# Patient Record
Sex: Female | Born: 1995 | Hispanic: No | Marital: Single
Health system: Southern US, Community
[De-identification: ages and names within clinical notes are randomized; demographics above are authoritative.]

## PROBLEM LIST (undated history)

## (undated) DIAGNOSIS — E119 Type 2 diabetes mellitus without complications: Secondary | ICD-10-CM

---
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
## 2015-07-10 ENCOUNTER — Emergency Department
Admission: EM | Admit: 2015-07-10 | Discharge: 2015-07-10 | Disposition: A | Discharge status: Home / Self Care | Payer: Medicaid Other | Attending: Emergency Medicine | Admitting: Emergency Medicine

## 2015-07-10 ENCOUNTER — Emergency Department: Payer: Medicaid Other

## 2015-07-10 ENCOUNTER — Other Ambulatory Visit: Payer: Self-pay

## 2015-07-10 ENCOUNTER — Encounter: Payer: Self-pay | Admitting: Emergency Medicine

## 2015-07-10 DIAGNOSIS — R0789 Other chest pain: Secondary | ICD-10-CM | POA: Diagnosis not present

## 2015-07-10 DIAGNOSIS — G8929 Other chronic pain: Secondary | ICD-10-CM | POA: Diagnosis not present

## 2015-07-10 DIAGNOSIS — I10 Essential (primary) hypertension: Secondary | ICD-10-CM | POA: Diagnosis not present

## 2015-07-10 DIAGNOSIS — E119 Type 2 diabetes mellitus without complications: Secondary | ICD-10-CM | POA: Insufficient documentation

## 2015-07-10 DIAGNOSIS — R079 Chest pain, unspecified: Secondary | ICD-10-CM | POA: Diagnosis present

## 2015-07-10 HISTORY — DX: Type 2 diabetes mellitus without complications: E11.9

## 2015-07-10 LAB — BASIC METABOLIC PANEL
ANION GAP: 7 (ref 5–15)
BUN: 14 mg/dL (ref 6–20)
CO2: 27 mmol/L (ref 22–32)
Calcium: 9.1 mg/dL (ref 8.9–10.3)
Chloride: 103 mmol/L (ref 101–111)
Creatinine, Ser: 0.56 mg/dL (ref 0.44–1.00)
GFR calc Af Amer: >60 mL/min (ref >60)
GLUCOSE: 125 mg/dL — AB (ref 65–99)
POTASSIUM: 4.4 mmol/L (ref 3.5–5.1)
Sodium: 137 mmol/L (ref 135–145)

## 2015-07-10 LAB — CBC
HCT: 39.1 % (ref 35.0–47.0)
Hemoglobin: 13 g/dL (ref 12.0–16.0)
MCH: 31 pg (ref 26.0–34.0)
MCHC: 33.3 g/dL (ref 32.0–36.0)
MCV: 92.9 fL (ref 80.0–100.0)
PLATELETS: 66 10*3/uL — AB (ref 150–440)
RBC: 4.21 MIL/uL (ref 3.80–5.20)
RDW: 13.9 % (ref 11.5–14.5)
WBC: 9.1 10*3/uL (ref 3.6–11.0)

## 2015-07-10 MED ORDER — GI COCKTAIL ~~LOC~~
30.0000 mL | Freq: Once | ORAL | Status: AC
  Administered 2015-07-10: 30 mL via ORAL
  Filled 2015-07-10: qty 30

## 2015-07-10 NOTE — ED Notes (Signed)
Pt to xray

## 2015-07-10 NOTE — ED Notes (Signed)
Special needs individual brought in via [facility] from school with caregiver who both speak spanish.  Interpreter paged.  Patient has diabetes and people that were with her thought that she was hypoglycemic, they checked her CBG and it was 125.  Patient usually has a burning sensation in her chest whenever she is hypoglycemic and told ems that this feels similar to that.  CBG with EMS 125, VSS during triage.  Interpreter paged.  NSR, ECG unremarkable per EMS.

## 2015-07-10 NOTE — ED Provider Notes (Addendum)
[facility] Emergency Department Provider Note  ____________________________________________  Time seen: Approximately 7:22 PM  I have reviewed the triage vital signs and the nursing notes.   HISTORY  Chief Complaint Chest Pain  History is limited due to patient mild cognitive dysfunction. Some of the history is obtained from her mother.  HPI Linda Mason is a 20 y.o. female with a history of Prader-Willi syndrome, HTN, DM, and chronic chest pain presenting with chest pain.Patient states that after eating a lunch consisting of a cheeseburger, beans, and fruit that she developed chest pain she is unable to characterize the chest pain but denies any associated shortness of breath, nausea or vomiting, palpitations, or syncope.  The patient's mother states that she often develops chest pain with the 2 most common causes being hyperglycemia and "if she sees something that makes a big impression on her."  Per her mother, there were no major events at school today and the patient's blood sugar was normal when checked by EMS. The patient denies any recent illness including nausea, vomiting, abdominal pain, cough or cold symptoms, fever or chills.   Past Medical History  Diagnosis Date  . Diabetes mellitus without complication (HCC)     There are no active problems to display for this patient.   History reviewed. No pertinent past surgical history.  No current outpatient prescriptions on file.  Allergies Review of patient's allergies indicates no known allergies.  No family history on file.  Social History Social History  Substance Use Topics  . Smoking status: Never Smoker   . Smokeless tobacco: None  . Alcohol Use: No    Review of Systems Constitutional: No fever/chills. No lightheadedness or syncope. Eyes: No visual changes. ENT: No sore throat. Cardiovascular: Positive chest pain, without palpitations. Respiratory: Denies shortness of breath.  No  cough. Gastrointestinal: No abdominal pain.  No nausea, no vomiting.  No diarrhea.  No constipation. Genitourinary: Negative for dysuria. Musculoskeletal: Negative for back pain. Skin: Negative for rash. Neurological: Negative for headaches, focal weakness or numbness.  10-point ROS otherwise negative.  ____________________________________________   PHYSICAL EXAM:  VITAL SIGNS: ED Triage Vitals  Enc Vitals Group     BP 07/10/15 1607 151/94 mmHg     Pulse Rate 07/10/15 1607 79     Resp 07/10/15 1607 18     Temp 07/10/15 1607 97.4 F (36.3 C)     Temp Source 07/10/15 1607 Oral     SpO2 07/10/15 1603 98 %     Weight --      Height --      Head Cir --      Peak Flow --      Pain Score --      Pain Loc --      Pain Edu? --      Excl. in GC? --     Constitutional: Patient is alert and oriented and able to answer basic questions. She does have a mild cognitive dysfunction and decreased personal insight.  Eyes: Conjunctivae are normal.  EOMI. Head: Atraumatic. Nose: No congestion/rhinnorhea. Mouth/Throat: Mucous membranes are moist.  Neck: No stridor.  Supple.   Cardiovascular: Normal rate, regular rhythm. No murmurs, rubs or gallops.  Respiratory: Normal respiratory effort.  No retractions. Lungs CTAB.  No wheezes, rales or ronchi. Gastrointestinal: Morbidly obese. Soft and nontender in all quadrants and the epigastric area.. No distention. No peritoneal signs. Musculoskeletal: No LE edema. No palpable cords or tenderness outpatient in the calves Neurologic:  Normal  speech and language. No gross focal neurologic deficits are appreciated.  Skin:  Skin is warm, dry and intact. No rash noted. Psychiatric: Mood is normal with flat affect. Speech and behavior are normal.  Normal judgement.  ____________________________________________   LABS (all labs ordered are listed, but only abnormal results are displayed)  Labs Reviewed  CBC - Abnormal; Notable for the following:     Platelets 66 (*)    All other components within normal limits  BASIC METABOLIC PANEL - Abnormal; Notable for the following:    Glucose, Bld 125 (*)    All other components within normal limits   ____________________________________________  EKG  ED ECG REPORT I, Rockne Menghini, the attending physician, personally viewed and interpreted this ECG.   Date: 07/10/2015  EKG Time: 1558  Rate: 82  Rhythm: normal sinus rhythm  Axis: Normal  Intervals:none  ST&T Change: Nonspecific T-wave inversion in V1. ST elevation.  ____________________________________________  RADIOLOGY  Dg Chest 2 View  07/10/2015  CLINICAL DATA:  Chest pain.  Diabetes mellitus. EXAM: CHEST  2 VIEW COMPARISON:  July 30, 2014 FINDINGS: There is no edema or consolidation. Heart is upper normal in size with pulmonary vascularity within normal limits. No adenopathy. There is thoracolumbar levoscoliosis. IMPRESSION: No edema or consolidation. Electronically Signed   By: Bretta Bang III M.D.   On: 07/10/2015 18:04    ____________________________________________   PROCEDURES  Procedure(s) performed: None  Critical Care performed: No ____________________________________________   INITIAL IMPRESSION / ASSESSMENT AND PLAN / ED COURSE  Pertinent labs & imaging results that were available during my care of the patient were reviewed by me and considered in my medical decision making (see chart for details).  20 y.o. female with a history of Prader-Willi syndrome, HTN, DM presenting with chest pain. The patient is hemodynamically stable and has normal cardiovascular exam.  Her blood sugar and other electrolytes are normal, and she has a chest x-ray which does not show any abnormalities. Her chest pain significantly improved after GI cocktail. It is possible that she may be having some reflux symptoms related to her oral intake and food choices. I do not see any evidence of acute MI, ACS, or life-threatening  pulmonary condition. Plan to discharge the patient home and have her follow-up with her regular doctor. We'll not initiate any additional medications at this time. Discussed return precautions as well as follow-up instructions with the patient's family.  ____________________________________________  FINAL CLINICAL IMPRESSION(S) / ED DIAGNOSES  Final diagnoses:  Other chest pain      NEW MEDICATIONS STARTED DURING THIS VISIT:  New Prescriptions   No medications on file      Rockne Menghini, MD 07/10/15 1928  Rockne Menghini, MD 07/10/15 1928

## 2015-07-10 NOTE — Discharge Instructions (Signed)
Please make an appointment with your primary care physician. Please consider food choices which may decrease reflux and may improve Mahkayla's chest pain.  Please return to the emergency department for chest pain, shortness of breath, palpitations, passing out or fainting, fever, or any other symptoms concerning to you.

## 2016-12-07 IMAGING — CR DG CHEST 1V PORT
1 series · 1 of 1 positions shown · non-contrast
Comparison: None.

CLINICAL DATA: [age] year old female with midsternal chest pain
and burning.

EXAM:
PORTABLE CHEST - 1 VIEW

[ap]
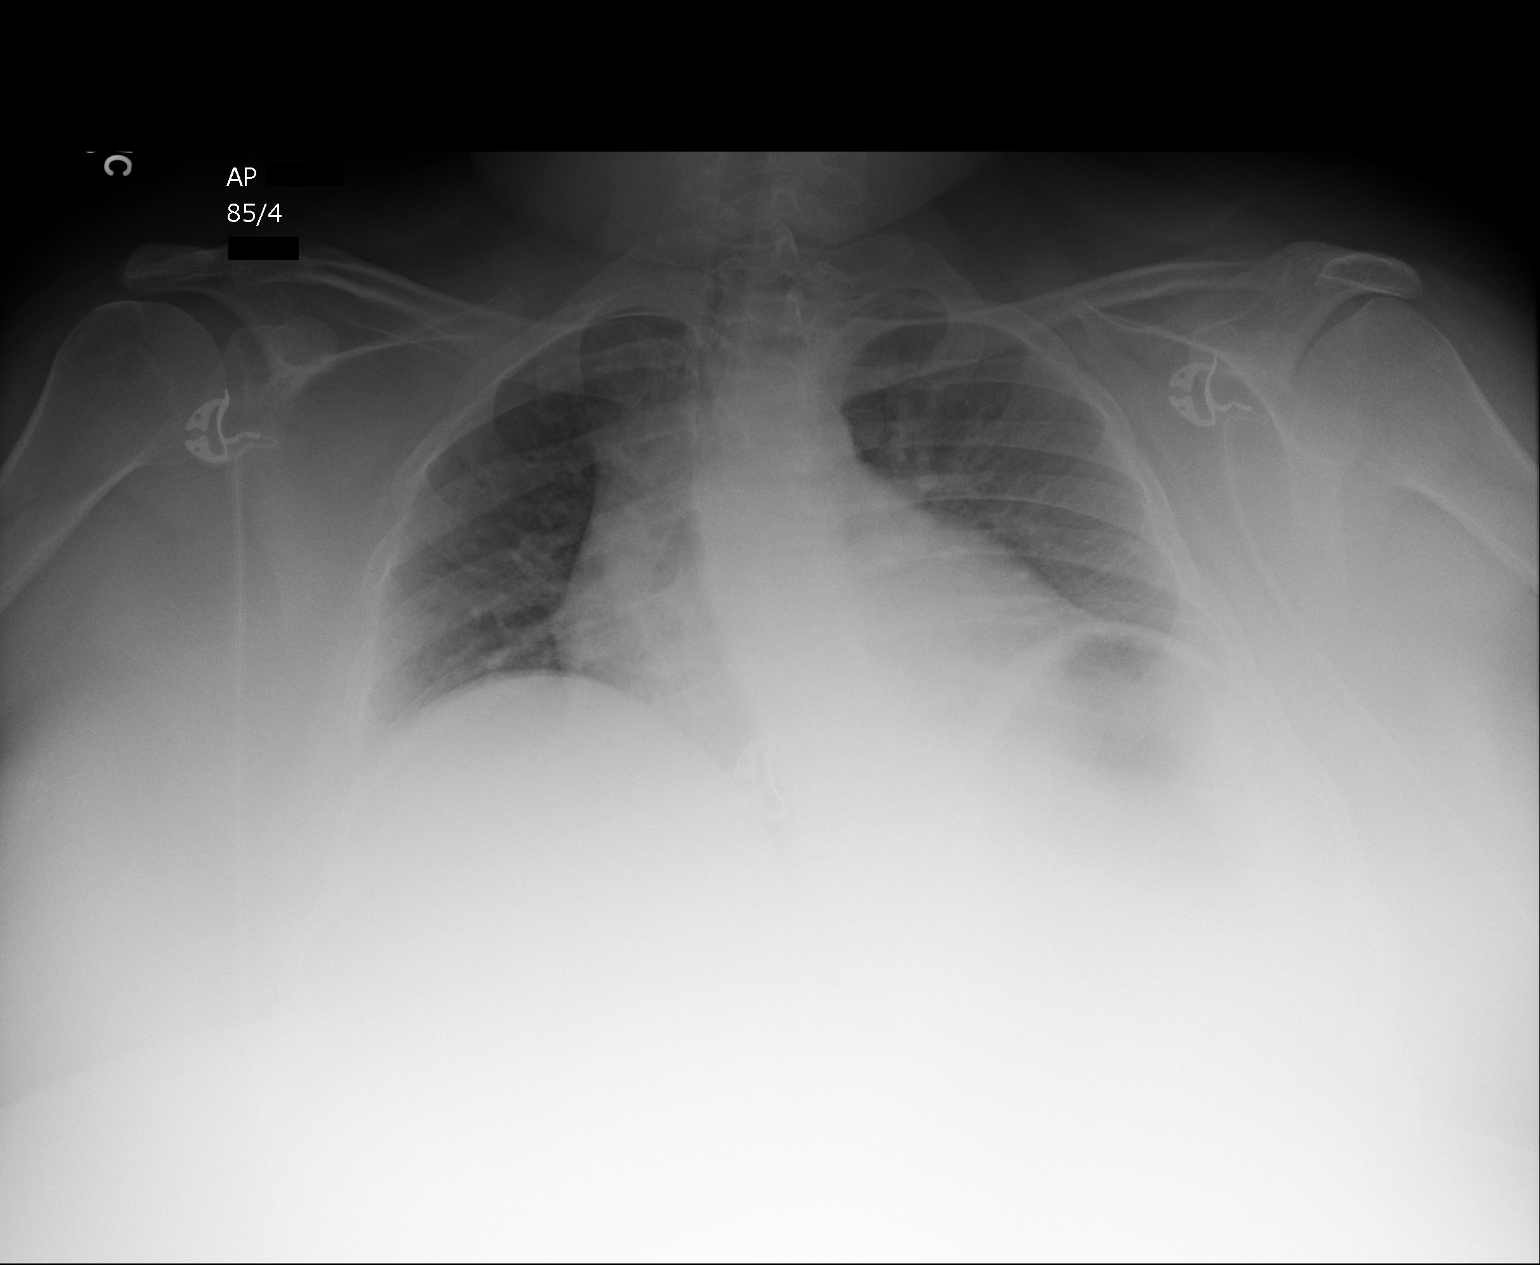

[1 of 1 positions shown; findings below may reference images not displayed]

FINDINGS: Lung volumes are low, accentuating the interstitial markings.

Apical lordotic positioning.

Base of the lungs not well visualized.

General haziness of the lungs likely represents overlying soft
tissues of the chest wall.

Cardiac diameter borderline enlarged, though this may be
projectional.

The diameter of the mediastinum at the vascular pedicle is
prominent.

No pneumothorax or large pleural effusion. No confluent airspace
disease.

No displaced fracture.
IMPRESSION: Lung volumes are low, accentuating the interstitium, with no
evidence of lobar pneumonia. Bases of the lungs not well evaluated.

In the absence of known trauma, the borderline enlarged
cardiomediastinal silhouette is likely a combination of the low lung
volumes, apical lordotic positioning, pericardial/mediastinal fat.
If there is a reason to be concerned for mediastinal or vascular
abnormality, CT would be recommended.

## 2019-05-28 ENCOUNTER — Other Ambulatory Visit: Payer: Self-pay

## 2019-05-28 DIAGNOSIS — E1140 Type 2 diabetes mellitus with diabetic neuropathy, unspecified: Secondary | ICD-10-CM | POA: Insufficient documentation

## 2019-05-28 DIAGNOSIS — M79672 Pain in left foot: Secondary | ICD-10-CM | POA: Insufficient documentation

## 2019-05-28 DIAGNOSIS — M79671 Pain in right foot: Secondary | ICD-10-CM | POA: Insufficient documentation

## 2019-05-28 LAB — GLUCOSE, CAPILLARY: Glucose-Capillary: 226 mg/dL — ABNORMAL HIGH (ref 70–99)

## 2019-05-28 NOTE — ED Triage Notes (Addendum)
Info obtained via [place] interpreter services, Johnsie Cancel # [phone].  Patient has bilateral foot pain that started earlier today.  Patient denies any type of injury.  Father reports cramping type of pain.  Reports patient has been drinking more water today.

## 2019-05-28 NOTE — ED Notes (Signed)
Patient to triage via wheelchair by EMS.  Per EMS patient reports bilateral foot pain that started earlier today.  Vitals within normal limits.

## 2019-05-29 ENCOUNTER — Emergency Department
Admission: EM | Admit: 2019-05-29 | Discharge: 2019-05-29 | Disposition: A | Discharge status: Home / Self Care | Payer: Medicaid Other | Attending: Emergency Medicine | Admitting: Emergency Medicine

## 2019-05-29 DIAGNOSIS — M79671 Pain in right foot: Secondary | ICD-10-CM

## 2019-05-29 DIAGNOSIS — G629 Polyneuropathy, unspecified: Secondary | ICD-10-CM

## 2019-05-29 LAB — BASIC METABOLIC PANEL
Anion gap: 13 (ref 5–15)
BUN: 17 mg/dL (ref 6–20)
CO2: 23 mmol/L (ref 22–32)
Calcium: 9.5 mg/dL (ref 8.9–10.3)
Chloride: 98 mmol/L (ref 98–111)
Creatinine, Ser: 0.56 mg/dL (ref 0.44–1.00)
GFR calc Af Amer: >60 mL/min (ref >60)
GFR calc non Af Amer: >60 mL/min (ref >60)
Glucose, Bld: 257 mg/dL — ABNORMAL HIGH (ref 70–99)
Potassium: 4.1 mmol/L (ref 3.5–5.1)
Sodium: 134 mmol/L — ABNORMAL LOW (ref 135–145)

## 2019-05-29 LAB — CBC WITH DIFFERENTIAL/PLATELET
Abs Immature Granulocytes: 0.14 10*3/uL — ABNORMAL HIGH (ref 0.00–0.07)
Basophils Absolute: 0.1 10*3/uL (ref 0.0–0.1)
Basophils Relative: 1 %
Eosinophils Absolute: 0.1 10*3/uL (ref 0.0–0.5)
Eosinophils Relative: 1 %
HCT: 48.7 % — ABNORMAL HIGH (ref 36.0–46.0)
Hemoglobin: 15.4 g/dL — ABNORMAL HIGH (ref 12.0–15.0)
Immature Granulocytes: 1 %
Lymphocytes Relative: 27 %
Lymphs Abs: 2.7 10*3/uL (ref 0.7–4.0)
MCH: 30.4 pg (ref 26.0–34.0)
MCHC: 31.6 g/dL (ref 30.0–36.0)
MCV: 96.2 fL (ref 80.0–100.0)
Monocytes Absolute: 1.1 10*3/uL — ABNORMAL HIGH (ref 0.1–1.0)
Monocytes Relative: 11 %
Neutro Abs: 6.1 10*3/uL (ref 1.7–7.7)
Neutrophils Relative %: 59 %
Platelets: 68 10*3/uL — ABNORMAL LOW (ref 150–400)
RBC: 5.06 MIL/uL (ref 3.87–5.11)
RDW: 14.7 % (ref 11.5–15.5)
WBC: 10.3 10*3/uL (ref 4.0–10.5)
nRBC: 0 % (ref 0.0–0.2)

## 2019-05-29 MED ORDER — GABAPENTIN 100 MG PO CAPS: 100.0000 mg | ORAL_CAPSULE | Freq: Three times a day (TID) | ORAL | 2 refills | Status: DC

## 2019-05-29 MED ORDER — OXYCODONE-ACETAMINOPHEN 5-325 MG PO TABS
2.0000 | ORAL_TABLET | Freq: Once | ORAL | Status: AC
Start: 2019-05-29 — End: 2019-05-29
  Administered 2019-05-29: 2 via ORAL
  Filled 2019-05-29: qty 2

## 2019-05-29 NOTE — ED Notes (Signed)
Using the interpreter pad pt and pt's father verbalized understanding of discharge instructions. Pt is in NAD at this time.

## 2019-05-29 NOTE — ED Provider Notes (Signed)
[facility] Emergency Department Provider Note       Time seen: ----------------------------------------- 7:34 AM on 05/29/2019 -----------------------------------------   I have reviewed the triage vital signs and the nursing notes.  HISTORY   Chief Complaint Foot Pain    HPI Linda Mason is a 23 y.o. female with a history of diabetes who presents to the ED for bilateral foot pain that started yesterday. According to the interpreter and the family she denies any type of injury, has had a cramping type of pain. Family reports she been drinking more water yesterday and was concerned that may be related. She denies fevers, chills or other complaints.  Past Medical History:  Diagnosis Date  . Diabetes mellitus without complication (HCC)     There are no active problems to display for this patient.   No past surgical history on file.  Allergies Patient has no known allergies.  Social History Social History   Tobacco Use  . Smoking status: Never Smoker  Substance Use Topics  . Alcohol use: No  . Drug use: Not on file   Review of Systems Constitutional: Negative for fever. Cardiovascular: Negative for chest pain. Respiratory: Negative for shortness of breath. Gastrointestinal: Negative for abdominal pain, vomiting and diarrhea. Musculoskeletal: Positive for bilateral foot pain and cramping Skin: Negative for rash. Neurological: Negative for headaches, focal weakness or numbness.  All systems negative/normal/unremarkable except as stated in the HPI  ____________________________________________   PHYSICAL EXAM:  VITAL SIGNS: ED Triage Vitals  Enc Vitals Group     BP 05/28/19 2314 118/66     Pulse Rate 05/28/19 2314 100     Resp 05/28/19 2314 20     Temp 05/28/19 2314 99.7 F (37.6 C)     Temp Source 05/28/19 2314 Oral     SpO2 05/28/19 2314 98 %     Weight 05/28/19 2317 220 lb (99.8 kg)     Height --      Head Circumference --       Peak Flow --      Pain Score 05/29/19 0358 0     Pain Loc --      Pain Edu? --      Excl. in GC? --    Constitutional: Alert and oriented. Well appearing and in no distress. Morbidly obese Eyes: Conjunctivae are normal. Normal extraocular movements. Cardiovascular: Normal rate, regular rhythm. No murmurs, rubs, or gallops. Respiratory: Normal respiratory effort without tachypnea nor retractions. Breath sounds are clear and equal bilaterally. No wheezes/rales/rhonchi. Gastrointestinal: Soft and nontender. Normal bowel sounds Musculoskeletal: No focal tenderness in the feet are noted. Feet appear to be neurovascularly intact. Neurologic:  Normal speech and language. No gross focal neurologic deficits are appreciated.  Skin:  Skin is warm, dry and intact. No rash noted. Psychiatric: Mood and affect are normal. Speech and behavior are normal.  ____________________________________________  ED COURSE:  As part of my medical decision making, I reviewed the following data within the electronic MEDICAL RECORD NUMBER History obtained from family if available, nursing notes, old chart and ekg, as well as notes from prior ED visits. Patient presented for bilateral foot pain, we will assess with labs and imaging as indicated at this time.   Procedures  [facility] was evaluated in Emergency Department on 05/29/2019 for the symptoms described in the history of present illness. She was evaluated in the context of the global COVID-19 pandemic, which necessitated consideration that the patient might be at risk for infection with the  SARS-CoV-2 virus that causes COVID-19. Institutional protocols and algorithms that pertain to the evaluation of patients at risk for COVID-19 are in a state of rapid change based on information released by regulatory bodies including the CDC and federal and state organizations. These policies and algorithms were followed during the patient's care in the ED.   ____________________________________________   LABS (pertinent positives/negatives)  Labs Reviewed  GLUCOSE, CAPILLARY - Abnormal; Notable for the following components:      Result Value   Glucose-Capillary 226 (*)    All other components within normal limits  CBC WITH DIFFERENTIAL/PLATELET - Abnormal; Notable for the following components:   Hemoglobin 15.4 (*)    HCT 48.7 (*)    Platelets 68 (*)    Monocytes Absolute 1.1 (*)    Abs Immature Granulocytes 0.14 (*)    All other components within normal limits  BASIC METABOLIC PANEL - Abnormal; Notable for the following components:   Sodium 134 (*)    Glucose, Bld 257 (*)    All other components within normal limits  CBG MONITORING, ED  POC URINE PREG, ED   ____________________________________________   DIFFERENTIAL DIAGNOSIS   Neuropathy, cellulitis, DVT  FINAL ASSESSMENT AND PLAN  Foot pain   Plan: The patient had presented for bilateral foot pain which is suspected to be neuropathy from diabetes. Patient's labs were overall reassuring. She has chronic thrombocytopenia. Patient's did not meet any criteria for imaging. She was started on gabapentin and encouraged to follow-up with her doctor for close outpatient follow-up.   Laurence Aly, MD    Note: This note was generated in part or whole with voice recognition software. Voice recognition is usually quite accurate but there are transcription errors that can and very often do occur. I apologize for any typographical errors that were not detected and corrected.     Earleen Newport, MD 05/29/19 [phone]

## 2019-09-23 ENCOUNTER — Ambulatory Visit: Payer: Medicaid Other | Attending: Internal Medicine

## 2019-09-23 ENCOUNTER — Other Ambulatory Visit: Payer: Self-pay

## 2019-09-23 DIAGNOSIS — Z23 Encounter for immunization: Secondary | ICD-10-CM

## 2019-09-23 NOTE — Progress Notes (Signed)
   Covid-19 Vaccination Clinic  Name:  Linda Mason [facility]    MRN: [id number] DOB: 22-Apr-1996  09/23/2019  [name] was observed post Covid-19 immunization for 15 minutes without incident. She was provided with Vaccine Information Sheet and instruction to access the V-Safe system.   [name] was instructed to call 911 with any severe reactions post vaccine: Marland Kitchen Difficulty breathing  . Swelling of face and throat  . A fast heartbeat  . A bad rash all over body  . Dizziness and weakness   Immunizations Administered    Name Date Dose VIS Date Route   Pfizer COVID-19 Vaccine 09/23/2019  7:38 PM 0.3 mL 06/16/2019 Intramuscular   Manufacturer: [employer], [employer]   Lot: [id number]   NDC: [address]-8

## 2019-10-15 ENCOUNTER — Ambulatory Visit: Payer: Medicaid Other | Attending: Internal Medicine

## 2019-10-15 DIAGNOSIS — Z23 Encounter for immunization: Secondary | ICD-10-CM

## 2019-10-15 NOTE — Progress Notes (Signed)
   Covid-19 Vaccination Clinic  Name:  Nomie Buchberger [facility]    MRN: [id number] DOB: [birth year]  10/15/2019  Ms. Sleep was observed post Covid-19 immunization for 15 minutes without incident. She was provided with Vaccine Information Sheet and instruction to access the V-Safe system.   Ms. Rodd was instructed to call 911 with any severe reactions post vaccine: Marland Kitchen Difficulty breathing  . Swelling of face and throat  . A fast heartbeat  . A bad rash all over body  . Dizziness and weakness   Immunizations Administered    Name Date Dose VIS Date Route   Pfizer COVID-19 Vaccine 10/15/2019  3:10 PM 0.3 mL 06/16/2019 Intramuscular   Manufacturer: [employer], [employer]   Lot: [id number]   NDC: [address]-9

## 2021-10-08 ENCOUNTER — Emergency Department: Payer: Medicaid Other

## 2021-10-08 ENCOUNTER — Inpatient Hospital Stay
Admission: EM | Admit: 2021-10-08 | Discharge: 2021-10-12 | DRG: 603 | Disposition: A | Discharge status: Home / Self Care | Payer: Medicaid Other | Attending: Internal Medicine | Admitting: Internal Medicine

## 2021-10-08 ENCOUNTER — Other Ambulatory Visit: Payer: Self-pay

## 2021-10-08 DIAGNOSIS — Z6838 Body mass index (BMI) 38.0-38.9, adult: Secondary | ICD-10-CM | POA: Diagnosis not present

## 2021-10-08 DIAGNOSIS — E039 Hypothyroidism, unspecified: Secondary | ICD-10-CM | POA: Diagnosis present

## 2021-10-08 DIAGNOSIS — K7460 Unspecified cirrhosis of liver: Secondary | ICD-10-CM | POA: Diagnosis present

## 2021-10-08 DIAGNOSIS — Z79890 Hormone replacement therapy (postmenopausal): Secondary | ICD-10-CM

## 2021-10-08 DIAGNOSIS — Z79899 Other long term (current) drug therapy: Secondary | ICD-10-CM

## 2021-10-08 DIAGNOSIS — Q8711 Prader-Willi syndrome: Secondary | ICD-10-CM | POA: Diagnosis not present

## 2021-10-08 DIAGNOSIS — L02215 Cutaneous abscess of perineum: Secondary | ICD-10-CM | POA: Diagnosis present

## 2021-10-08 DIAGNOSIS — E1140 Type 2 diabetes mellitus with diabetic neuropathy, unspecified: Secondary | ICD-10-CM | POA: Diagnosis present

## 2021-10-08 DIAGNOSIS — Z8249 Family history of ischemic heart disease and other diseases of the circulatory system: Secondary | ICD-10-CM

## 2021-10-08 DIAGNOSIS — L03818 Cellulitis of other sites: Secondary | ICD-10-CM | POA: Diagnosis not present

## 2021-10-08 DIAGNOSIS — Z794 Long term (current) use of insulin: Secondary | ICD-10-CM | POA: Diagnosis not present

## 2021-10-08 DIAGNOSIS — L03314 Cellulitis of groin: Secondary | ICD-10-CM

## 2021-10-08 DIAGNOSIS — D696 Thrombocytopenia, unspecified: Secondary | ICD-10-CM | POA: Diagnosis present

## 2021-10-08 DIAGNOSIS — E1165 Type 2 diabetes mellitus with hyperglycemia: Secondary | ICD-10-CM | POA: Diagnosis present

## 2021-10-08 DIAGNOSIS — L0390 Cellulitis, unspecified: Secondary | ICD-10-CM | POA: Diagnosis not present

## 2021-10-08 DIAGNOSIS — E1142 Type 2 diabetes mellitus with diabetic polyneuropathy: Secondary | ICD-10-CM | POA: Diagnosis present

## 2021-10-08 DIAGNOSIS — I10 Essential (primary) hypertension: Secondary | ICD-10-CM | POA: Diagnosis present

## 2021-10-08 DIAGNOSIS — Z20822 Contact with and (suspected) exposure to covid-19: Secondary | ICD-10-CM | POA: Diagnosis present

## 2021-10-08 DIAGNOSIS — Z7984 Long term (current) use of oral hypoglycemic drugs: Secondary | ICD-10-CM | POA: Diagnosis not present

## 2021-10-08 LAB — CBC WITH DIFFERENTIAL/PLATELET
Abs Immature Granulocytes: 0.04 10*3/uL (ref 0.00–0.07)
Basophils Absolute: 0 10*3/uL (ref 0.0–0.1)
Basophils Relative: 1 %
Eosinophils Absolute: 0.1 10*3/uL (ref 0.0–0.5)
Eosinophils Relative: 2 %
HCT: 47.6 % — ABNORMAL HIGH (ref 36.0–46.0)
Hemoglobin: 15.1 g/dL — ABNORMAL HIGH (ref 12.0–15.0)
Immature Granulocytes: 1 %
Lymphocytes Relative: 32 %
Lymphs Abs: 2.7 10*3/uL (ref 0.7–4.0)
MCH: 30.8 pg (ref 26.0–34.0)
MCHC: 31.7 g/dL (ref 30.0–36.0)
MCV: 96.9 fL (ref 80.0–100.0)
Monocytes Absolute: 0.8 10*3/uL (ref 0.1–1.0)
Monocytes Relative: 10 %
Neutro Abs: 4.7 10*3/uL (ref 1.7–7.7)
Neutrophils Relative %: 54 %
Platelets: 66 10*3/uL — ABNORMAL LOW (ref 150–400)
RBC: 4.91 MIL/uL (ref 3.87–5.11)
RDW: 13.5 % (ref 11.5–15.5)
WBC: 8.4 10*3/uL (ref 4.0–10.5)
nRBC: 0 % (ref 0.0–0.2)

## 2021-10-08 LAB — COMPREHENSIVE METABOLIC PANEL
ALT: 49 U/L — ABNORMAL HIGH (ref 0–44)
AST: 44 U/L — ABNORMAL HIGH (ref 15–41)
Albumin: 3.5 g/dL (ref 3.5–5.0)
Alkaline Phosphatase: 140 U/L — ABNORMAL HIGH (ref 38–126)
Anion gap: 10 (ref 5–15)
BUN: 19 mg/dL (ref 6–20)
CO2: 27 mmol/L (ref 22–32)
Calcium: 8.7 mg/dL — ABNORMAL LOW (ref 8.9–10.3)
Chloride: 104 mmol/L (ref 98–111)
Creatinine, Ser: 0.52 mg/dL (ref 0.44–1.00)
GFR, Estimated: >60 mL/min (ref >60)
Glucose, Bld: 203 mg/dL — ABNORMAL HIGH (ref 70–99)
Potassium: 4 mmol/L (ref 3.5–5.1)
Sodium: 141 mmol/L (ref 135–145)
Total Bilirubin: 1.1 mg/dL (ref 0.3–1.2)
Total Protein: 7.3 g/dL (ref 6.5–8.1)

## 2021-10-08 LAB — LACTIC ACID, PLASMA
Lactic Acid, Venous: 1.6 mmol/L (ref 0.5–1.9)
Lactic Acid, Venous: 1 mmol/L (ref 0.5–1.9)

## 2021-10-08 LAB — RESP PANEL BY RT-PCR (FLU A&B, COVID) ARPGX2
Influenza A by PCR: NEGATIVE
Influenza B by PCR: NEGATIVE
SARS Coronavirus 2 by RT PCR: NEGATIVE

## 2021-10-08 LAB — GLUCOSE, CAPILLARY: Glucose-Capillary: 171 mg/dL — ABNORMAL HIGH (ref 70–99)

## 2021-10-08 LAB — PROCALCITONIN: Procalcitonin: <0.1 ng/mL

## 2021-10-08 MED ORDER — VANCOMYCIN HCL IN DEXTROSE 1-5 GM/200ML-% IV SOLN: 1000.0000 mg | INTRAVENOUS | Status: DC

## 2021-10-08 MED ORDER — INSULIN ASPART 100 UNIT/ML IJ SOLN
0.0000 [IU] | Freq: Every day | INTRAMUSCULAR | Status: DC
  Administered 2021-10-09: 3 [IU] via SUBCUTANEOUS
  Administered 2021-10-10: 2 [IU] via SUBCUTANEOUS
  Filled 2021-10-08 (×2): qty 1

## 2021-10-08 MED ORDER — VANCOMYCIN HCL IN DEXTROSE 1-5 GM/200ML-% IV SOLN
1000.0000 mg | Freq: Once | INTRAVENOUS | Status: AC
  Administered 2021-10-08: 1000 mg via INTRAVENOUS
  Filled 2021-10-08: qty 200

## 2021-10-08 MED ORDER — POLYETHYLENE GLYCOL 3350 17 G PO PACK: 17.0000 g | PACK | Freq: Every day | ORAL | Status: DC | PRN

## 2021-10-08 MED ORDER — PANTOPRAZOLE SODIUM 40 MG PO TBEC
40.0000 mg | DELAYED_RELEASE_TABLET | Freq: Every day | ORAL | Status: DC
  Administered 2021-10-09 – 2021-10-12 (×4): 40 mg via ORAL
  Filled 2021-10-08 (×4): qty 1

## 2021-10-08 MED ORDER — LACTATED RINGERS IV SOLN: INTRAVENOUS | Status: DC

## 2021-10-08 MED ORDER — ENOXAPARIN SODIUM 40 MG/0.4ML IJ SOSY: 40.0000 mg | PREFILLED_SYRINGE | INTRAMUSCULAR | Status: DC

## 2021-10-08 MED ORDER — MELATONIN 5 MG PO TABS: 2.5000 mg | ORAL_TABLET | Freq: Every evening | ORAL | Status: DC | PRN

## 2021-10-08 MED ORDER — IBUPROFEN 400 MG PO TABS
200.0000 mg | ORAL_TABLET | Freq: Four times a day (QID) | ORAL | Status: DC | PRN
  Administered 2021-10-09: 200 mg via ORAL
  Filled 2021-10-08: qty 1

## 2021-10-08 MED ORDER — IOHEXOL 300 MG/ML  SOLN
100.0000 mL | Freq: Once | INTRAMUSCULAR | Status: AC | PRN
  Administered 2021-10-08: 100 mL via INTRAVENOUS
  Filled 2021-10-08: qty 100

## 2021-10-08 MED ORDER — LEVOTHYROXINE SODIUM 50 MCG PO TABS
50.0000 ug | ORAL_TABLET | Freq: Every day | ORAL | Status: DC
  Administered 2021-10-09 – 2021-10-12 (×4): 50 ug via ORAL
  Filled 2021-10-08 (×4): qty 1

## 2021-10-08 MED ORDER — OXYCODONE HCL 5 MG PO TABS: 5.0000 mg | ORAL_TABLET | Freq: Four times a day (QID) | ORAL | Status: DC | PRN

## 2021-10-08 MED ORDER — VANCOMYCIN HCL IN DEXTROSE 1-5 GM/200ML-% IV SOLN
1000.0000 mg | Freq: Two times a day (BID) | INTRAVENOUS | Status: DC
  Administered 2021-10-09 – 2021-10-12 (×7): 1000 mg via INTRAVENOUS
  Filled 2021-10-08 (×7): qty 200

## 2021-10-08 MED ORDER — SODIUM CHLORIDE 0.9 % IV SOLN
2.0000 g | INTRAVENOUS | Status: DC
  Administered 2021-10-09 – 2021-10-11 (×4): 2 g via INTRAVENOUS
  Filled 2021-10-08 (×2): qty 2
  Filled 2021-10-08: qty 20
  Filled 2021-10-08: qty 2

## 2021-10-08 MED ORDER — VANCOMYCIN HCL IN DEXTROSE 1-5 GM/200ML-% IV SOLN
1000.0000 mg | Freq: Once | INTRAVENOUS | Status: AC
  Administered 2021-10-09: 1000 mg via INTRAVENOUS
  Filled 2021-10-08: qty 200

## 2021-10-08 MED ORDER — METOPROLOL SUCCINATE ER 25 MG PO TB24
12.5000 mg | ORAL_TABLET | Freq: Every day | ORAL | Status: DC
  Administered 2021-10-09 – 2021-10-12 (×4): 12.5 mg via ORAL
  Filled 2021-10-08 (×4): qty 1

## 2021-10-08 MED ORDER — INSULIN ASPART 100 UNIT/ML IJ SOLN
0.0000 [IU] | Freq: Three times a day (TID) | INTRAMUSCULAR | Status: DC
  Administered 2021-10-09 – 2021-10-10 (×4): 4 [IU] via SUBCUTANEOUS
  Administered 2021-10-10 (×2): 7 [IU] via SUBCUTANEOUS
  Administered 2021-10-11: 11 [IU] via SUBCUTANEOUS
  Administered 2021-10-11 – 2021-10-12 (×4): 7 [IU] via SUBCUTANEOUS
  Filled 2021-10-08 (×11): qty 1

## 2021-10-08 MED ORDER — GABAPENTIN 100 MG PO CAPS
100.0000 mg | ORAL_CAPSULE | Freq: Three times a day (TID) | ORAL | Status: DC
  Administered 2021-10-08 – 2021-10-12 (×11): 100 mg via ORAL
  Filled 2021-10-08 (×11): qty 1

## 2021-10-08 NOTE — H&P (Addendum)
?History and Physical ? ?Linda Mason [facility] [id number] DOB: [birth year] DOA: 10/08/2021 ? ?Referring physician: Sherral Hammers, PA-EDP  ?PCP: [facility] [place]  ?Outpatient Specialists: Endocrinology ? ?Patient coming from: Home. ? ?Chief Complaint: Mons pubis pain and swelling. ? ?HPI: Linda Mason is a 26 y.o. female with medical history significant for Prader-Willi syndrome, type 2 diabetes, diabetic polyneuropathy, hypothyroidism, GERD, morbid obesity, who presented to [facility] ED with complaints of worsening edema, pain, erythema affecting her mons pubis.  She has been symptomatic for approximately 1 week.  Associated with scant drainage.  No reported fevers or chills.  No prior history of skin abscesses.  Work-up in the ED revealed uncontrolled type 2 diabetes and elevated liver enzymes.  CT scan showed no focal collections identified.  Prominent lymph node in the right cardiophrenic angle likely reactive.  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  EDP discussed case with gynecology, Dr. Amalia Hailey, who recommended IV antibiotics for couple of days and admission by [facility], hospitalist service.  Patient received a dose of IV vancomycin in the ED. ? ?ED Course: Temperature 98 degrees.  BP 113/84, heart rate 99, respiratory 18, saturation 93% on room air. ? ?Review of Systems: ?Review of systems as noted in the HPI. All other systems reviewed and are negative. ? ? ?Past Medical History:  ?Diagnosis Date  ? Diabetes mellitus without complication ([place])   ? ?History reviewed. No pertinent surgical history. ? ?Social History:  reports that she has never smoked. She does not have any smokeless tobacco history on file. She reports that she does not drink alcohol. No history on file for drug use. ? ? ?No Known Allergies ? ?Family history: ?Her mother hypertension, hypothyroidism. ? ? ?Prior to Admission medications   ?Medication Sig Start Date End Date Taking? Authorizing Provider  ?gabapentin  (NEURONTIN) 100 MG capsule Take 1 capsule (100 mg total) by mouth 3 (three) times daily. 05/29/19 05/28/20  Earleen Newport, MD  ? ? ?Physical Exam: ?BP 113/84   Pulse 99   Temp 98 ?F (36.7 ?C) (Oral)   Resp 18   Wt 96.6 kg   SpO2 93%  ? ?General: 26 y.o. year-old female well developed well nourished in no acute distress.  Alert and oriented x3. ?Cardiovascular: Regular rate and rhythm with no rubs or gallops.  No thyromegaly or JVD noted.  No lower extremity edema. 2/4 pulses in all 4 extremities. ?Respiratory: Clear to auscultation with no wheezes or rales. Good inspiratory effort. ?Abdomen: Soft nontender nondistended with normal bowel sounds x4 quadrants. ?Muskuloskeletal: No cyanosis, clubbing or edema noted bilaterally ?Neuro: CN II-XII intact, strength, sensation, reflexes ?Skin:  ? ?Psychiatry: Judgement and insight appear normal. Mood is appropriate for condition and setting ?   ?   ?   ?Labs on Admission:  ?Basic Metabolic Panel: ?Recent Labs  ?Lab 10/08/21 ?1750  ?NA 141  ?K 4.0  ?CL 104  ?CO2 27  ?GLUCOSE 203*  ?BUN 19  ?CREATININE 0.52  ?CALCIUM 8.7*  ? ?Liver Function Tests: ?Recent Labs  ?Lab 10/08/21 ?1750  ?AST 44*  ?ALT 49*  ?ALKPHOS 140*  ?BILITOT 1.1  ?PROT 7.3  ?ALBUMIN 3.5  ? ?No results for input(s): LIPASE, AMYLASE in the last 168 hours. ?No results for input(s): AMMONIA in the last 168 hours. ?CBC: ?Recent Labs  ?Lab 10/08/21 ?1757  ?WBC 8.4  ?NEUTROABS 4.7  ?HGB 15.1*  ?HCT 47.6*  ?MCV 96.9  ?PLT 66*  ? ?Cardiac Enzymes: ?No results for input(s): CKTOTAL,  CKMB, CKMBINDEX, TROPONINI in the last 168 hours. ? ?BNP (last 3 results) ?No results for input(s): BNP in the last 8760 hours. ? ?ProBNP (last 3 results) ?No results for input(s): PROBNP in the last 8760 hours. ? ?CBG: ?No results for input(s): GLUCAP in the last 168 hours. ? ?Radiological Exams on Admission: ?CT ABDOMEN PELVIS W CONTRAST ? ?Result Date: 10/08/2021 ?CLINICAL DATA:  Acute nonlocalized abdominal pain. Abscess to the  vaginal area, first noticed yesterday. EXAM: CT ABDOMEN AND PELVIS WITH CONTRAST TECHNIQUE: Multidetector CT imaging of the abdomen and pelvis was performed using the standard protocol following bolus administration of intravenous contrast. RADIATION DOSE REDUCTION: This exam was performed according to the departmental dose-optimization program which includes automated exposure control, adjustment of the mA and/or kV according to patient size and/or use of iterative reconstruction technique. CONTRAST:  163mL OMNIPAQUE IOHEXOL 300 MG/ML  SOLN COMPARISON:  None. FINDINGS: Lower chest: Lung bases are clear. Prominent lymph nodes in the right cardiophrenic angle, measuring up to 9 mm diameter. Hepatobiliary: Nodular contour to the liver suggest hepatic cirrhosis. No focal lesions. Gallbladder and bile ducts are normal. Pancreas: Unremarkable. No pancreatic ductal dilatation or surrounding inflammatory changes. Spleen: Diffuse splenic enlargement.  No focal lesions. Adrenals/Urinary Tract: Adrenal glands are unremarkable. Kidneys are normal, without renal calculi, focal lesion, or hydronephrosis. Bladder is unremarkable. Stomach/Bowel: Stomach, small bowel, and colon are not abnormally distended. No wall thickening or inflammatory changes. Appendix is not identified. Vascular/Lymphatic: Normal caliber abdominal aorta. Scattered celiac axis, mesenteric, and retroperitoneal lymph nodes without pathologic enlargement, likely reactive. Reproductive: No abnormal pelvic mass or collection. No vaginal soft tissue collections are identified. Other: No free air or free fluid in the abdomen. Abdominal wall musculature appears intact. Musculoskeletal: Thoracolumbar scoliosis convex towards the left. No acute bony abnormalities. IMPRESSION: 1. No focal collections identified. 2. Prominent lymph nodes in the right cardiophrenic angle, likely reactive. 3. 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 Electronically Signed    By: Lucienne Capers M.D.   On: 10/08/2021 19:39   ? ?EKG: I independently viewed the EKG done and my findings are as followed: None available at the time of the dictation. ? ?Assessment/Plan ?Present on Admission: ? Cellulitis ? ?Principal Problem: ?  Cellulitis ? ?Mons pubis cellulitis, POA ?Reported symptoms of worsening edema, erythema and pain for the past 1 week. ?CT abdomen pelvis with contrast showed no focal collections identified. ?EDP discussed case with gynecology who will see in consultation.  They requested admission to hospitalist service for 2 days of IV antibiotics ?Received 1 dose of IV vancomycin in the ED ?Obtain MRSA screening test ?Continue IV vancomycin and Rocephin. ?Follow blood cultures x2, peripherally. ?Pain management along with bowel regimen ?Formal consult placed for gynecology, contact service in the morning. ? ?Uncontrolled type 2 diabetes with hyperglycemia ?Obtain hemoglobin A1c ?Start insulin sliding scale ? ?Diabetic polyneuropathy ?Resume home gabapentin ? ?Hypothyroidism ?Resume home levothyroxine. ? ?Hypertension ?Resume home regimen. ? ?Elevated liver chemistries in the setting of incidentally found changes of hepatic cirrhosis with splenic enlargement on CT scan ?Alkaline phosphatase 140, AST and ALT also mildly elevated. ?Monitor for now ?Repeat level in the morning ? ?Chronic thrombocytopenia ?Platelet count 66, at baseline ?Noted thrombocytopenia since 2017 ?No overt bleeding, monitor for now ? ?Splenomegaly seen on CT scan ?Monitor platelet count ? ? ?DVT prophylaxis: SCDs ? ?Code Status: Full code ? ?Family Communication: Mother and son at bedside. ? ?Disposition Plan: Admitted to [facility]. ? ?Consults called: Gynecology consulted. ? ?  Admission status: Inpatient status. ? ? ?Status is: Inpatient ?Patient requires at least 2 midnights for further evaluation and treatment of present condition.  Gynecology requested 2 days of IV antibiotics. ? ? ?Kayleen Memos  MD ?[facility] ?Pager [phone] ? ?If 7PM-7AM, please contact night-coverage ?[email or web address] ?Password [email or web address] ? ?10/08/2021, 8:28 PM   ?

## 2021-10-08 NOTE — Progress Notes (Signed)
Pharmacy Antibiotic Note ? ?Linda Mason is a 26 y.o. female admitted on 10/08/2021 with mons pubis cellulitis.  Pharmacy has been consulted for vancomycin dosing. ? ?-planning 2 days of IV abx ?-also on Ceftriaxone ? ?Plan: ?Patient received Vancomycin 1 gram in ED. Will order another 1 gram for a total Loading dose of 2000 mg Vancomycin ?-Will continue with Vancomycin 1000 mg IV Q 12 hrs. Goal AUC 400-550. ?Expected AUC: 556 ?SCr used: 0.8  (actual 0.52) ?Cmin 15.5 ?Vd= 0.5  (BMI 38) ? ?  ? ?Height: 5\' 2"  (157.5 cm) ?Weight: 96.6 kg (213 lb) ?IBW/kg (Calculated) : 50.1 ? ?Temp (24hrs), Avg:98.1 ?F (36.7 ?C), Min:98 ?F (36.7 ?C), Max:98.2 ?F (36.8 ?C) ? ?Recent Labs  ?Lab 10/08/21 ?1750 10/08/21 ?1757  ?WBC  --  8.4  ?CREATININE 0.52  --   ?LATICACIDVEN 1.6  --   ?  ?Estimated Creatinine Clearance: 115.6 mL/min (by C-G formula based on SCr of 0.52 mg/dL).   ? ?No Known Allergies ? ?Antimicrobials this admission: ?vanc 4/5 >>   ?    >>   ? ?Dose adjustments this admission: ?  ? ?Microbiology results: ?4/5 pending BCx:   ?  UCx:    ?  Sputum:    ?  MRSA PCR:  pending ? ? ?Thank you for allowing pharmacy to be a part of this patient?s care. ? ?Zyrus Hetland A ?10/08/2021 9:53 PM ? ?

## 2021-10-08 NOTE — ED Triage Notes (Signed)
Pt comes pov with mom sent by PCP. Abscess to vaginal area. Pt is diabetic. First noticed it yesterday. Denies any fevers.  ?

## 2021-10-08 NOTE — ED Provider Notes (Signed)
? ?[facility] ?Provider Note ? ?Patient Contact: 5:52 PM (approximate) ? ? ?History  ? ?Abscess ? ? ?HPI ? ?Linda Mason is a 26 y.o. female with a history of diabetes and Prader-Willi syndrome, presents to the emergency department with a very large abscess of the mons pubis.  Patient has been symptomatic for approximately 1 week.  Patient does have a scant amount of spontaneous drainage.  No fever or chills at home according to mom.  No abscess in a similar location in the past. ?  ? ? ?Physical Exam  ? ?Triage Vital Signs: ?ED Triage Vitals  ?Enc Vitals Group  ?   BP 10/08/21 1737 113/84  ?   Pulse Rate 10/08/21 1737 99  ?   Resp 10/08/21 1737 18  ?   Temp 10/08/21 1740 98 ?F (36.7 ?C)  ?   Temp Source 10/08/21 1740 Oral  ?   SpO2 10/08/21 1737 93 %  ?   Weight 10/08/21 1738 213 lb (96.6 kg)  ?   Height --   ?   Head Circumference --   ?   Peak Flow --   ?   Pain Score --   ?   Pain Loc --   ?   Pain Edu? --   ?   Excl. in GC? --   ? ? ?Most recent vital signs: ?Vitals:  ? 10/08/21 1737 10/08/21 1740  ?BP: 113/84   ?Pulse: 99   ?Resp: 18   ?Temp:  98 ?F (36.7 ?C)  ?SpO2: 93%   ? ? ? ?General: Alert and in no acute distress. ?Eyes:  PERRL. EOMI. ?Head: No acute traumatic findings ?ENT: ?     Ears:  ?     Nose: No congestion/rhinnorhea. ?     Mouth/Throat: Mucous membranes are moist.  ?Neck: No stridor. No cervical spine tenderness to palpation. ?Cardiovascular:  Good peripheral perfusion ?Respiratory: Normal respiratory effort without tachypnea or retractions. Lungs CTAB. Good air entry to the bases with no decreased or absent breath sounds. ?Gastrointestinal: Bowel sounds ?4 quadrants. Soft and nontender to palpation. No guarding or rigidity. No palpable masses. No distention. No CVA tenderness. ?Genitourinary: Patient has significant edema of the mons pubis with extension of swelling into the labia.  Patient has surrounding erythema and a scant amount of spontaneous  drainage. ?Musculoskeletal: Full range of motion to all extremities.  ?Neurologic:  No gross focal neurologic deficits are appreciated.  ?Skin:   No rash noted ?Other: ? ? ? ? ?ED Results / Procedures / Treatments  ? ?Labs ?(all labs ordered are listed, but only abnormal results are displayed) ?Labs Reviewed  ?COMPREHENSIVE METABOLIC PANEL - Abnormal; Notable for the following components:  ?    Result Value  ? Glucose, Bld 203 (*)   ? Calcium 8.7 (*)   ? AST 44 (*)   ? ALT 49 (*)   ? Alkaline Phosphatase 140 (*)   ? All other components within normal limits  ?CBC WITH DIFFERENTIAL/PLATELET - Abnormal; Notable for the following components:  ? Hemoglobin 15.1 (*)   ? HCT 47.6 (*)   ? Platelets 66 (*)   ? All other components within normal limits  ?RESP PANEL BY RT-PCR (FLU A&B, COVID) [id number]  ?AEROBIC/ANAEROBIC CULTURE W GRAM STAIN (SURGICAL/DEEP WOUND)  ?CULTURE, BLOOD (ROUTINE X 2)  ?CULTURE, BLOOD (ROUTINE X 2)  ?LACTIC ACID, PLASMA  ?PROCALCITONIN  ?CBC WITH DIFFERENTIAL/PLATELET  ?LACTIC ACID, PLASMA  ?HIV ANTIBODY (ROUTINE TESTING W REFLEX)  ?  CBC  ?CREATININE, SERUM  ? ? ? ? ? ? ?RADIOLOGY ? ?I personally viewed and evaluated these images as part of my medical decision making, as well as reviewing the written report by the radiologist. ? ?ED Provider Interpretation: I personally reviewed CT abdomen pelvis and there was no focal drainable fluid collection. ? ? ?PROCEDURES: ? ?Critical Care performed: No ? ?Procedures ? ? ?MEDICATIONS ORDERED IN ED: ?Medications  ?vancomycin (VANCOCIN) IVPB 1000 mg/200 mL premix (1,000 mg Intravenous New Bag/Given 10/08/21 2015)  ?cefTRIAXone (ROCEPHIN) 2 g in sodium chloride 0.9 % 100 mL IVPB (has no administration in time range)  ?enoxaparin (LOVENOX) injection 40 mg (has no administration in time range)  ?vancomycin (VANCOCIN) IVPB 1000 mg/200 mL premix (has no administration in time range)  ?iohexol (OMNIPAQUE) 300 MG/ML solution 100 mL (100 mLs Intravenous Contrast Given 10/08/21  1923)  ? ? ? ?IMPRESSION / MDM / ASSESSMENT AND PLAN / ED COURSE  ?I reviewed the triage vital signs and the nursing notes. ?             ?               ?Assessment and plan:  ?Cellulitis:  ?Differential diagnosis includes, but is not limited to, abscess, cellulitis, Bartholin cyst ? ?25 year old female presents to the emergency department with apparent large abscess of the mons pubis. ? ?Patient's was mildly tachycardic at triage but vital signs were otherwise reassuring.  She was alert, and active and nontoxic-appearing. ? ?White blood cell count within range.  Procalcitonin and lactic within range. ? ?I personally reviewed CT abdomen pelvis and there was no focal drainable fluid collection.  I consulted gynecologist on-call, Dr. Logan Bores who personally reviewed picture of mons pubis and agrees with need for admission. ?  ?She also has patient was accepted to the hospitalist service under the care of Dr. Margo Aye. ? ?FINAL CLINICAL IMPRESSION(S) / ED DIAGNOSES  ? ?Final diagnoses:  ?Cellulitis of groin  ? ? ? ?Rx / DC Orders  ? ?ED Discharge Orders   ? ? None  ? ?  ? ? ? ?Note:  This document was prepared using Dragon voice recognition software and may include unintentional dictation errors. ?  ?Orvil Feil, PA-C ?10/08/21 2050 ? ?  ?Merwyn Katos, MD ?10/08/21 2159 ? ?

## 2021-10-09 DIAGNOSIS — E039 Hypothyroidism, unspecified: Secondary | ICD-10-CM

## 2021-10-09 DIAGNOSIS — D696 Thrombocytopenia, unspecified: Secondary | ICD-10-CM | POA: Diagnosis present

## 2021-10-09 DIAGNOSIS — L03314 Cellulitis of groin: Secondary | ICD-10-CM | POA: Diagnosis not present

## 2021-10-09 DIAGNOSIS — E1140 Type 2 diabetes mellitus with diabetic neuropathy, unspecified: Secondary | ICD-10-CM | POA: Diagnosis present

## 2021-10-09 DIAGNOSIS — I10 Essential (primary) hypertension: Secondary | ICD-10-CM

## 2021-10-09 DIAGNOSIS — Q8711 Prader-Willi syndrome: Secondary | ICD-10-CM

## 2021-10-09 LAB — CBC WITH DIFFERENTIAL/PLATELET
Abs Immature Granulocytes: 0.04 10*3/uL (ref 0.00–0.07)
Basophils Absolute: 0.1 10*3/uL (ref 0.0–0.1)
Basophils Relative: 1 %
Eosinophils Absolute: 0.2 10*3/uL (ref 0.0–0.5)
Eosinophils Relative: 2 %
HCT: 48.5 % — ABNORMAL HIGH (ref 36.0–46.0)
Hemoglobin: 15.4 g/dL — ABNORMAL HIGH (ref 12.0–15.0)
Immature Granulocytes: 1 %
Lymphocytes Relative: 33 %
Lymphs Abs: 2.3 10*3/uL (ref 0.7–4.0)
MCH: 30.4 pg (ref 26.0–34.0)
MCHC: 31.8 g/dL (ref 30.0–36.0)
MCV: 95.8 fL (ref 80.0–100.0)
Monocytes Absolute: 0.7 10*3/uL (ref 0.1–1.0)
Monocytes Relative: 11 %
Neutro Abs: 3.7 10*3/uL (ref 1.7–7.7)
Neutrophils Relative %: 52 %
Platelets: 56 10*3/uL — ABNORMAL LOW (ref 150–400)
RBC: 5.06 MIL/uL (ref 3.87–5.11)
RDW: 13.5 % (ref 11.5–15.5)
WBC: 7 10*3/uL (ref 4.0–10.5)
nRBC: 0 % (ref 0.0–0.2)

## 2021-10-09 LAB — MRSA NEXT GEN BY PCR, NASAL: MRSA by PCR Next Gen: NOT DETECTED

## 2021-10-09 LAB — HIV ANTIBODY (ROUTINE TESTING W REFLEX): HIV Screen 4th Generation wRfx: NONREACTIVE

## 2021-10-09 LAB — COMPREHENSIVE METABOLIC PANEL
ALT: 45 U/L — ABNORMAL HIGH (ref 0–44)
AST: 34 U/L (ref 15–41)
Albumin: 3.3 g/dL — ABNORMAL LOW (ref 3.5–5.0)
Alkaline Phosphatase: 131 U/L — ABNORMAL HIGH (ref 38–126)
Anion gap: 8 (ref 5–15)
BUN: 14 mg/dL (ref 6–20)
CO2: 26 mmol/L (ref 22–32)
Calcium: 8.7 mg/dL — ABNORMAL LOW (ref 8.9–10.3)
Chloride: 106 mmol/L (ref 98–111)
Creatinine, Ser: 0.41 mg/dL — ABNORMAL LOW (ref 0.44–1.00)
GFR, Estimated: >60 mL/min (ref >60)
Glucose, Bld: 179 mg/dL — ABNORMAL HIGH (ref 70–99)
Potassium: 4.1 mmol/L (ref 3.5–5.1)
Sodium: 140 mmol/L (ref 135–145)
Total Bilirubin: 0.9 mg/dL (ref 0.3–1.2)
Total Protein: 7.2 g/dL (ref 6.5–8.1)

## 2021-10-09 LAB — GLUCOSE, CAPILLARY
Glucose-Capillary: 154 mg/dL — ABNORMAL HIGH (ref 70–99)
Glucose-Capillary: 173 mg/dL — ABNORMAL HIGH (ref 70–99)
Glucose-Capillary: 172 mg/dL — ABNORMAL HIGH (ref 70–99)
Glucose-Capillary: 272 mg/dL — ABNORMAL HIGH (ref 70–99)

## 2021-10-09 NOTE — Assessment & Plan Note (Signed)
Meets criteria with BMI greater than 35 plus comorbidities of diabetes and hypertension ?

## 2021-10-09 NOTE — Progress Notes (Signed)
[facility] Progress Note ? ?Patient: Linda Mason [facility]    FWY:[id number]  DOA: 10/08/2021    ?Date of Service: the patient was seen and examined on 10/09/2021 ? ?Brief hospital course: ?26 year old female with past medical history of Prader-Willi syndrome, diabetes mellitus with secondary neuropathy, hypothyroidism and morbid obesity who presented to the emergency room on 4/5 with complaints of pain, erythema of her mons pubis and worsening edema x1 week.  In the emergency room, patient found to have cellulitis of the mons pubis.  CT scan was unrevealing.  Gynecology recommended IV antibiotics for several days and patient was brought into the hospitalist service. ? ?Assessment and Plan: ?Assessment and Plan: ?* Cellulitis ?Cellulitis of mons pubis.  Gynecology consulted.  Started on IV vancomycin and Rocephin.  Blood cultures with no growth to date.  Clinically, cellulitis looks to be much improved from previous day.  OB/GYN if infection needs to be drained. ? ?Diabetes mellitus with neuropathy (HCC) ?Continue sliding scale.  Checking A1c.  Continue Neurontin.  Interestingly, patient looks to be on metformin, Jardiance and 50 units of Lantus and only getting sliding scale here and CBGs around 170. ? ?Essential hypertension ?Continue metoprolol ? ?Thrombocytopenia (HCC) ?Unclear etiology.  Could be secondary to hepatic cirrhosis.  At baseline.  Platelet count 66.  Monitor for bleeding. ? ?Hypothyroidism ?We will check TSH.  W continue Synthroid ? ?Morbid obesity (HCC) ?Meets criteria with BMI greater than 35 plus comorbidities of diabetes and hypertension ? ? ? ? ? ? ?Body mass index is 38.96 kg/m?.  ?  ?   ? ?Consultants: ?GYN ? ?Procedures: ?None ? ?Antimicrobials: ?IV vancomycin and Rocephin 4/5-present ? ?Code Status: Full code ? ? ?Subjective: Patient doing okay.  Pain is less. ? ?Objective: ?Vital signs were reviewed and unremarkable. ?Vitals:  ? 10/09/21 0445 10/09/21 0823  ?BP: 114/70 112/73  ?Pulse:  80 64  ?Resp: 18 18  ?Temp: 97.9 ?F (36.6 ?C) 97.6 ?F (36.4 ?C)  ?SpO2: 90% 96%  ? ? ?Intake/Output Summary (Last 24 hours) at 10/09/2021 1343 ?Last data filed at 10/09/2021 1042 ?Gross per 24 hour  ?Intake 1279.91 ml  ?Output --  ?Net 1279.91 ml  ? ?Filed Weights  ? 10/08/21 1738  ?Weight: 96.6 kg  ? ?Body mass index is 38.96 kg/m?. ? ?Exam: ? ?General: Alert and oriented x3, no acute distress ?HEENT: Normocephalic, atraumatic, mucous membranes are moist ?Cardiovascular: Regular rate and rhythm, S1-S2 ?Respiratory: Lungs clear to auscultation bilaterally ?Abdomen: Abdomen soft:, Nontender, nondistended, positive bowel sounds ?GU: Mons pubis area with erythema and looks to be area of ingrown hair follicle.  Erythema much improved compared to pictures from previous day ?Musculoskeletal: No clubbing or cyanosis, trace pitting edema ?Skin: No skin breaks, tears or lesions other than described above ?Psychiatry: Appropriate, no evidence of psychoses ?Neurology: No focal deficits ? ?Data Reviewed: ?Today's labs stable ? ?Disposition:  ?Status is: Inpatient ?Continued need for IV antibiotics ?  ? ?Anticipated discharge date: 4/7 ? ?Remaining issues to be resolved so that patient can be discharged: Further improvement of cellulitis ? ? ?Family Communication: Mother at bedside, translator used ?DVT Prophylaxis: ?Place and maintain sequential compression device Start: 10/08/21 2158 ? ? ? ?Author: ?Hollice Espy ,MD ?10/09/2021 1:43 PM ? ?To reach On-call, see care teams to locate the attending and reach out via [email or web address]. ?Between 7PM-7AM, please contact night-coverage ?If you still have difficulty reaching the attending provider, please page the [facility] (Director on Call) for [facility] on amion for assistance. ? ?

## 2021-10-09 NOTE — Assessment & Plan Note (Addendum)
Unclear etiology.  Could be secondary to hepatic cirrhosis.  Platelet count ranging between 50s to 60s, although this is chronic and at her baseline.  No bleeding during hospitalization ?

## 2021-10-09 NOTE — Assessment & Plan Note (Addendum)
Continue metoprolol. 

## 2021-10-09 NOTE — TOC Initial Note (Signed)
Transition of Care (TOC) - Initial/Assessment Note  ? ? ?Patient Details  ?Name: Linda Mason [facility] ?MRN: [id number] ?Date of Birth: 1995-12-10 ? ?Transition of Care (TOC) CM/SW Contact:    ?Chapman Fitch, RN ?Phone Number: ?10/09/2021, 8:58 AM ? ?Clinical Narrative:                 ? ?Transition of Care (TOC) Screening Note ? ? ?Patient Details  ?Name: Linda Mason [facility] ?Date of Birth: 02-03-1996 ? ? ?Transition of Care (TOC) CM/SW Contact:    ?Chapman Fitch, RN ?Phone Number: ?10/09/2021, 8:59 AM ? ? ? ?Transition of Care Department [facility]) has reviewed patient and no TOC needs have been identified at this time. We will continue to monitor patient advancement through interdisciplinary progression rounds. If new patient transition needs arise, please place a TOC consult. ? ? ? ?  ?  ? ? ?Patient Goals and CMS Choice ?  ?  ?  ? ?Expected Discharge Plan and Services ?  ?  ?  ?  ?  ?                ?  ?  ?  ?  ?  ?  ?  ?  ?  ?  ? ?Prior Living Arrangements/Services ?  ?  ?  ?       ?  ?  ?  ?  ? ?Activities of Daily Living ?  ?  ? ?Permission Sought/Granted ?  ?  ?   ?   ?   ?   ? ?Emotional Assessment ?  ?  ?  ?  ?  ?  ? ?Admission diagnosis:  Cellulitis of groin [L03.314] ?Cellulitis [L03.90] ?Patient Active Problem List  ? Diagnosis Date Noted  ? Morbid obesity (HCC) 10/09/2021  ? Diabetes mellitus with neuropathy (HCC) 10/09/2021  ? Essential hypertension 10/09/2021  ? Thrombocytopenia (HCC) 10/09/2021  ? Hypothyroidism 10/09/2021  ? Prader-Willi syndrome 10/09/2021  ? Cellulitis 10/08/2021  ? ?[facility]P:  [facility] ?Pharmacy:  No Pharmacies Listed ? ? ? ?Social Determinants of Health (SDOH) Interventions ?  ? ?Readmission Risk Interventions ?   ? View : No data to display.  ?  ?  ?  ? ? ? ?

## 2021-10-09 NOTE — Assessment & Plan Note (Addendum)
Cellulitis of mons pubis.  Started on IV vancomycin and Rocephin.  Blood cultures with no growth to date.  Clinically, cellulitis still prominent and with some mild drainage from wound.  OB evaluated patient on 4/7.  Although draining, no apparent area of abscess formation and drainage is most likely surrounding edema.  Patient continued on IV antibiotics.  By 4/9, area noted improvement.  In discussion with family through translator, plan will be for patient to be discharged home and continue p.o. Bactrim for 10 more days.  She will follow-up with her PCP in the next 1 to 2 weeks.  Given also prescription for lidocaine gel topically as needed for pain ?

## 2021-10-09 NOTE — Hospital Course (Addendum)
26 year old female with past medical history of Prader-Willi syndrome, diabetes mellitus with secondary neuropathy, hypothyroidism and morbid obesity who presented to the emergency room on 4/5 with complaints of pain, erythema of her mons pubis and worsening edema x1 week.  In the emergency room, patient found to have cellulitis of the mons pubis.  CT scan was unrevealing.  Gynecology recommended IV antibiotics for several days and patient was brought in on the hospitalist service. ?

## 2021-10-09 NOTE — Assessment & Plan Note (Addendum)
Continue sliding scale.  Checking A1c.  Continue Neurontin.  At home, patient on metformin, Jardiance and 50 units of Lantus.  Initially only on sliding scale and his CBGs have started to trend up, restarted Lantus at 8 units and slowly have been increasing the dose as CBGs have trended upward.  Patient will resume her home dose of Lantus upon discharge. ?

## 2021-10-09 NOTE — Assessment & Plan Note (Addendum)
TSH within normal limits.  Continue Synthroid. 

## 2021-10-09 NOTE — Plan of Care (Signed)
?  Problem: Health Behavior/Discharge Planning: ?Goal: Ability to manage health-related needs will improve ?Outcome: Progressing ?  ?Problem: Clinical Measurements: ?Goal: Diagnostic test results will improve ?Outcome: Progressing ?  ?Problem: Pain Managment: ?Goal: General experience of comfort will improve ?Outcome: Progressing ?  ?Problem: Clinical Measurements: ?Goal: Ability to avoid or minimize complications of infection will improve ?Outcome: Progressing ?  ?

## 2021-10-10 DIAGNOSIS — L0390 Cellulitis, unspecified: Secondary | ICD-10-CM | POA: Diagnosis not present

## 2021-10-10 DIAGNOSIS — E039 Hypothyroidism, unspecified: Secondary | ICD-10-CM | POA: Diagnosis not present

## 2021-10-10 DIAGNOSIS — E1140 Type 2 diabetes mellitus with diabetic neuropathy, unspecified: Secondary | ICD-10-CM | POA: Diagnosis not present

## 2021-10-10 DIAGNOSIS — I10 Essential (primary) hypertension: Secondary | ICD-10-CM | POA: Diagnosis not present

## 2021-10-10 DIAGNOSIS — Z794 Long term (current) use of insulin: Secondary | ICD-10-CM

## 2021-10-10 DIAGNOSIS — L03314 Cellulitis of groin: Secondary | ICD-10-CM | POA: Diagnosis not present

## 2021-10-10 LAB — GLUCOSE, CAPILLARY
Glucose-Capillary: 229 mg/dL — ABNORMAL HIGH (ref 70–99)
Glucose-Capillary: 189 mg/dL — ABNORMAL HIGH (ref 70–99)
Glucose-Capillary: 224 mg/dL — ABNORMAL HIGH (ref 70–99)
Glucose-Capillary: 215 mg/dL — ABNORMAL HIGH (ref 70–99)

## 2021-10-10 LAB — TSH: TSH: 3.519 u[IU]/mL (ref 0.350–4.500)

## 2021-10-10 LAB — HEMOGLOBIN A1C
Hgb A1c MFr Bld: 11.5 % — ABNORMAL HIGH (ref 4.8–5.6)
Mean Plasma Glucose: 283 mg/dL

## 2021-10-10 MED ORDER — LIDOCAINE HCL URETHRAL/MUCOSAL 2 % EX GEL
1.0000 "application " | Freq: Two times a day (BID) | CUTANEOUS | Status: DC | PRN
  Administered 2021-10-10 – 2021-10-11 (×2): 1 via TOPICAL
  Filled 2021-10-10 (×2): qty 5

## 2021-10-10 MED ORDER — TRAMADOL HCL 50 MG PO TABS: 50.0000 mg | ORAL_TABLET | Freq: Four times a day (QID) | ORAL | Status: DC | PRN

## 2021-10-10 MED ORDER — SODIUM CHLORIDE 0.9 % IV SOLN: INTRAVENOUS | Status: DC | PRN

## 2021-10-10 MED ORDER — INSULIN GLARGINE-YFGN 100 UNIT/ML ~~LOC~~ SOLN
8.0000 [IU] | Freq: Every day | SUBCUTANEOUS | Status: DC
Start: 2021-10-10 — End: 2021-10-11
  Administered 2021-10-10: 8 [IU] via SUBCUTANEOUS
  Filled 2021-10-10 (×2): qty 0.08

## 2021-10-10 MED ORDER — IBUPROFEN 400 MG PO TABS
600.0000 mg | ORAL_TABLET | Freq: Four times a day (QID) | ORAL | Status: DC | PRN
  Administered 2021-10-11: 600 mg via ORAL
  Filled 2021-10-10: qty 2

## 2021-10-10 NOTE — Progress Notes (Signed)
[facility] Progress Note ? ?Patient: Linda Mason [facility]    ZOX:[id number]  DOA: 10/08/2021    ?Date of Service: the patient was seen and examined on 10/10/2021 ? ?Brief hospital course: ?26 year old female with past medical history of Prader-Willi syndrome, diabetes mellitus with secondary neuropathy, hypothyroidism and morbid obesity who presented to the emergency room on 4/5 with complaints of pain, erythema of her mons pubis and worsening edema x1 week.  In the emergency room, patient found to have cellulitis of the mons pubis.  CT scan was unrevealing.  Gynecology recommended IV antibiotics for several days and patient was brought in on the hospitalist service. ? ?Assessment and Plan: ?Assessment and Plan: ?* Cellulitis ?Cellulitis of mons pubis.  Started on IV vancomycin and Rocephin.  Blood cultures with no growth to date.  Clinically, cellulitis still prominent and with some mild drainage from wound.  Have reached out to [facility] for evaluation and possible I&D. ? ?Diabetes mellitus with neuropathy (HCC) ?Continue sliding scale.  Checking A1c.  Continue Neurontin.  At home, patient on metformin, Jardiance and 50 units of Lantus.  Only on sliding scale here although CBGs have started to trend up over the last 24 hours ranging between 170s to 270.  We will slowly restart Lantus. ? ?Essential hypertension ?Continue metoprolol ? ?Thrombocytopenia (HCC) ?Unclear etiology.  Could be secondary to hepatic cirrhosis.  At baseline.  Platelet count 66.  Monitor for bleeding. ? ?Hypothyroidism ?We will check TSH.  W continue Synthroid ? ?Morbid obesity (HCC) ?Meets criteria with BMI greater than 35 plus comorbidities of diabetes and hypertension ? ? ? ? ? ? ?Body mass index is 38.96 kg/m?.  ?  ?   ? ?Consultants: ?GYN ? ?Procedures: ?None ? ?Antimicrobials: ?IV vancomycin and Rocephin 4/5-present ? ?Code Status: Full code ? ? ?Subjective: Patient noting more pain. ? ?Objective: ?Vital signs were reviewed and  unremarkable. ?Vitals:  ? 10/10/21 0439 10/10/21 0926  ?BP: 124/86 128/87  ?Pulse: 71 83  ?Resp: 16   ?Temp: 97.6 ?F (36.4 ?C) 97.6 ?F (36.4 ?C)  ?SpO2: 96% 94%  ? ? ?Intake/Output Summary (Last 24 hours) at 10/10/2021 1223 ?Last data filed at 10/10/2021 1040 ?Gross per 24 hour  ?Intake 2380.77 ml  ?Output 500 ml  ?Net 1880.77 ml  ? ? ?Filed Weights  ? 10/08/21 1738  ?Weight: 96.6 kg  ? ?Body mass index is 38.96 kg/m?. ? ?Exam: ? ?General: Alert and oriented x3, no acute distress ?HEENT: Normocephalic, atraumatic, mucous membranes are moist ?Cardiovascular: Regular rate and rhythm, S1-S2 ?Respiratory: Lungs clear to auscultation bilaterally ?Abdomen: Abdomen soft:, Nontender, nondistended, positive bowel sounds ?GU Mons pubis area with erythema and looks to be area of ingrown hair follicle.  Persistent erythema, looks slightly increased from previous day.  Wound has some mild drainage. ?Musculoskeletal: No clubbing or cyanosis, trace pitting edema ?Skin: No skin breaks, tears or lesions other than described above ?Psychiatry: Appropriate, no evidence of psychoses ?Neurology: No focal deficits ? ?Data Reviewed: ?Today's labs stable ? ?Disposition:  ?Status is: Inpatient ?Continued need for IV antibiotics ?  ? ?Anticipated discharge date: 4/8 ? ?Remaining issues to be resolved so that patient can be discharged: Further improvement of cellulitis, evaluation by GYN ? ? ?Family Communication: Father at bedside, translator used ?DVT Prophylaxis: ?Place and maintain sequential compression device Start: 10/08/21 2158 ? ? ? ?Author: ?Hollice Espy ,MD ?10/10/2021 12:23 PM ? ?To reach On-call, see care teams to locate the attending and reach out via [email or web address]. ?Between 7PM-7AM,  please contact night-coverage ?If you still have difficulty reaching the attending provider, please page the [facility] (Director on Call) for [facility] on amion for assistance. ? ?

## 2021-10-10 NOTE — Progress Notes (Signed)
Pharmacy Antibiotic Note ? ?Linda Mason is a 26 y.o. female admitted on 10/08/2021 with mons pubis cellulitis.  Pharmacy has been consulted for vancomycin dosing. ? ?On Day 3 of antibiotics. Will continue to treat empirically for now, per attending. ? ?Plan: ?-Will continue with Vancomycin 1000 mg IV Q 12 hrs. Goal AUC 400-550. ?Expected AUC: 556 ?SCr used: 0.8  (actual 0.41) ?Cmin 15.5 ?Vd= 0.5  (BMI 38) ? ?  ? ?Height: 5\' 2"  (157.5 cm) ?Weight: 96.6 kg (213 lb) ?IBW/kg (Calculated) : 50.1 ? ?Temp (24hrs), Avg:98 ?F (36.7 ?C), Min:97.6 ?F (36.4 ?C), Max:98.5 ?F (36.9 ?C) ? ?Recent Labs  ?Lab 10/08/21 ?1750 10/08/21 ?1757 10/08/21 ?2250 10/09/21 ?12/09/21  ?WBC  --  8.4  --  7.0  ?CREATININE 0.52  --   --  0.41*  ?LATICACIDVEN 1.6  --  1.0  --   ? ?  ?Estimated Creatinine Clearance: 115.6 mL/min (A) (by C-G formula based on SCr of 0.41 mg/dL (L)).   ? ?No Known Allergies ? ?Antimicrobials this admission: ?Vanc 4/5 >>   ?Rocephin 4/5 >>   ? ?Dose adjustments this admission: ?  ? ?Microbiology results: ?4/5 pending BCx:  NGTD    ?4/5 MRSA PCR:  not detected ? ? ?Thank you for allowing pharmacy to be a part of this patient?s care. ? ?6/5 ?10/10/2021 1:41 PM ? ?

## 2021-10-10 NOTE — Progress Notes (Addendum)
Inpatient Diabetes Program Recommendations ? ?AACE/ADA: New Consensus Statement on Inpatient Glycemic Control (2015) ? ?Target Ranges:  Prepandial:   less than 140 mg/dL ?     Peak postprandial:   less than 180 mg/dL (1-2 hours) ?     Critically ill patients:  140 - 180 mg/dL  ? ? Latest Reference Range & Units 10/08/21 22:50  ?Hemoglobin A1C 4.8 - 5.6 % 11.5 (H)  ?(H): Data is abnormally high ? Latest Reference Range & Units 10/09/21 08:05 10/09/21 11:30 10/09/21 17:01 10/09/21 20:51  ?Glucose-Capillary 70 - 99 mg/dL 332 (H) ? ?4 units Novolog ? 173 (H) ? ?4 units Novolog ? 172 (H) ? ?4 units Novolog ? 272 (H) ? ?3 units Novolog ?  ? ? Latest Reference Range & Units 10/10/21 09:13  ?Glucose-Capillary 70 - 99 mg/dL 951 (H)  ?(H): Data is abnormally high ? ? ? ?Admit with: Cellulitis of mons pubis ? ?History: DM, Prader-Willi Syndrome, Morbid Obesity ? ?Home DM Meds: Metformin 1000 mg BID ?       Jardiance 25 mg daily ?       Semaglutide 2 mg Qweek ?       Lantus 60 units Daily ?       Novolog 6 units TID with meals ?       See Below for last ENDO visit ? ?Current Orders: Novolog Resistant Correction Scale/ SSI (0-20 units) TID AC + HS ?      ? ?MD- Please consider adding portion of pt's home dose of Basal insulin to current hospital regimen ? ?Semglee 12 units Daily to start (20% home dose) ? ? ?  ?Endocrinologist: Dr. Naoma Diener with [facility] ?Last visit 07/15/2021 ?A1c at that visit was 13.8% ?Patient was told to take the following at that visit: ?Continue Metformin 1000 mg BID ?Continue Jardiance 25 mg daily ?Increase Semaglutide to 2mg   Qweek ?Increase Lantus to 60 units Daily ?Start Novolog 6 units TID with meals ? ? ?--Will follow patient during hospitalization-- ? ? RN, MSN, CDE ?Diabetes Coordinator ?Inpatient Glycemic Control Team ?Team Pager: [phone] (8a-5p) ? ? ?

## 2021-10-10 NOTE — Progress Notes (Signed)
Mobility Specialist - Progress Note ? ? 10/10/21 1100  ?Mobility  ?Activity Ambulated independently in hallway  ?Level of Assistance Independent  ?Assistive Device Other (Comment) ?(IV Pole)  ?Distance Ambulated (ft) 360 ft  ?Activity Response Tolerated well  ?$Mobility charge 1 Mobility  ? ? ? ?Pt ambulated hallway independently. No complaints.  ? ? ?Filiberto Pinks ?Mobility Specialist ?10/10/21, 11:34 AM ? ? ? ? ?

## 2021-10-10 NOTE — Consult Note (Signed)
? ? ? ? ?GYNECOLOGY INPATIENT CONSULT NOTE ? ? ?Reason for Consult:Mons Pubis cellulitis ?Referring Physician: Virginia RochesterSendil Krishnan, MD (Hospitalist) ? ?Research officer, trade unionpanish Interpreter [employer](Language Line) used for visit, Linda Limerickrmando [phone]#[id number] ? ?Linda BallsBrenda Yesenia Mason is an 26 y.o. P0 female with a PMH of Prader-Willi syndrome, diabetes mellitus (uncontrolled), hypothyroidism, and morbid obesity who was admitted 2 days ago secondary to mons pubis cellulitis and uncontrolled diabetes.  Patient has been receiving IV antibiotics (vancomycin) for the past 2 days, and was called to reassess the affected area as more drainage has been noted and concern for possible abscess.  Patient previously consulted on by Linda Baileyavid Evans, MD. ? ?Today patient complains that the area is hurting her.  Denies fevers or chills. ? ? ?Patient Active Problem List  ? Diagnosis Date Noted  ? Morbid obesity (HCC) 10/09/2021  ? Diabetes mellitus with neuropathy (HCC) 10/09/2021  ? Essential hypertension 10/09/2021  ? Thrombocytopenia (HCC) 10/09/2021  ? Hypothyroidism 10/09/2021  ? Prader-Willi syndrome 10/09/2021  ? Cellulitis 10/08/2021  ? ? ?Menstrual History: ?No LMP recorded. (Menstrual status: Irregular Periods). ?  ? ?Past Medical History:  ?Diagnosis Date  ? Diabetes mellitus without complication (HCC)   ? ? ?History reviewed. No pertinent surgical history. ? ?History reviewed. No pertinent family history. ? ?Social History:  reports that she has never smoked. She does not have any smokeless tobacco history on file. She reports that she does not drink alcohol. No history on file for drug use. ? ?Allergies: No Known Allergies ? ?Medications: Prior to Admission:  ?Medications Prior to Admission  ?Medication Sig Dispense Refill Last Dose  ? empagliflozin (JARDIANCE) 25 MG TABS tablet Take 25 mg by mouth daily.   10/08/2021  ? gabapentin (NEURONTIN) 100 MG capsule Take 1 capsule (100 mg total) by mouth 3 (three) times daily. 90 capsule 2 10/08/2021  ? insulin aspart (NOVOLOG)  100 UNIT/ML injection Inject 4 Units into the skin 3 (three) times daily with meals.   10/08/2021  ? insulin glargine (LANTUS) 100 UNIT/ML injection Inject 50 Units into the skin at bedtime.   10/08/2021  ? levothyroxine (SYNTHROID) 50 MCG tablet Take 50 mcg by mouth daily before breakfast.   10/08/2021  ? lisinopril (ZESTRIL) 40 MG tablet Take 40 mg by mouth daily.   10/08/2021  ? metFORMIN (GLUCOPHAGE) 500 MG tablet Take 1,000 mg by mouth 2 (two) times daily with a meal.   10/08/2021  ? metoprolol succinate (TOPROL-XL) 25 MG 24 hr tablet Take 25 mg by mouth daily.   10/08/2021  ? omeprazole (PRILOSEC) 40 MG capsule Take 40 mg by mouth daily.   10/08/2021  ? ?Scheduled: ? gabapentin  100 mg Oral TID  ? insulin aspart  0-20 Units Subcutaneous TID WC  ? insulin aspart  0-5 Units Subcutaneous QHS  ? insulin glargine-yfgn  8 Units Subcutaneous QHS  ? levothyroxine  50 mcg Oral Q0600  ? metoprolol succinate  12.5 mg Oral Daily  ? pantoprazole  40 mg Oral Daily  ? ?Continuous: ? cefTRIAXone (ROCEPHIN)  IV Stopped (10/09/21 2121)  ? lactated ringers 50 mL/hr at 10/10/21 0400  ? vancomycin 1,000 mg (10/10/21 1303)  ? ?EAV:[id number]:ibuprofen, melatonin, oxyCODONE, polyethylene glycol ?Anti-infectives (From admission, onward)  ? ? Start     Dose/Rate Route Frequency Ordered Stop  ? 10/09/21 1200  vancomycin (VANCOCIN) IVPB 1000 mg/200 mL premix       ? 1,000 mg ?200 mL/hr over 60 Minutes Intravenous Every 12 hours 10/08/21 2150    ? 10/08/21 2115  vancomycin (VANCOCIN) IVPB 1000 mg/200 mL premix       ? 1,000 mg ?200 mL/hr over 60 Minutes Intravenous  Once 10/08/21 2110 10/09/21 0507  ? 10/08/21 2100  cefTRIAXone (ROCEPHIN) 2 g in sodium chloride 0.9 % 100 mL IVPB       ? 2 g ?200 mL/hr over 30 Minutes Intravenous Every 24 hours 10/08/21 2026 10/15/21 2259  ? 10/08/21 2045  vancomycin (VANCOCIN) IVPB 1000 mg/200 mL premix  Status:  Discontinued       ? 1,000 mg ?200 mL/hr over 60 Minutes Intravenous Every 24 hours 10/08/21 2033 10/08/21 2110  ?  10/08/21 2015  vancomycin (VANCOCIN) IVPB 1000 mg/200 mL premix       ? 1,000 mg ?200 mL/hr over 60 Minutes Intravenous  Once 10/08/21 2005 10/08/21 2135  ? ?  ? ? ?Review of Systems  ?Constitutional:  Negative for appetite change, chills and fever.  ?HENT: Negative.    ?Eyes: Negative.   ?Respiratory: Negative.    ?Cardiovascular: Negative.   ?Gastrointestinal: Negative.   ?Genitourinary:   ?     Vulvar soreness and swelling with drainage.   ?Musculoskeletal: Negative.   ?Neurological: Negative.   ?Psychiatric/Behavioral: Negative.    ? ?Blood pressure 128/87, pulse 83, temperature 97.6 ?F (36.4 ?C), temperature source Oral, resp. rate 16, height 5\' 2"  (1.575 m), weight 96.6 kg, SpO2 94 %. ?Physical Exam ?Constitutional:   ?   General: She is not in acute distress. ?   Appearance: She is obese. She is not ill-appearing.  ?HENT:  ?   Head: Normocephalic and atraumatic.  ?   Nose: Nose normal.  ?   Mouth/Throat:  ?   Mouth: Mucous membranes are moist.  ?Eyes:  ?   Extraocular Movements: Extraocular movements intact.  ?   Pupils: Pupils are equal, round, and reactive to light.  ?Abdominal:  ?   General: Abdomen is flat.  ?   Palpations: Abdomen is soft.  ?Genitourinary: ?   Comments: Mons pubis with edematous skin changes.  There is an area approximately 3 x 3 cm that is slightly darker with an area of drainage of scant sero-sanguinous fluid.  No areas of induration, fluctuance, or erythema present. Areas is soft throughout. No masses palpable.  ?Neurological:  ?   Mental Status: She is alert.  ? ? ?Results for orders placed or performed during the hospital encounter of 10/08/21 (from the past 48 hour(s))  ?Comprehensive metabolic panel     Status: Abnormal  ? Collection Time: 10/08/21  5:50 PM  ?Result Value Ref Range  ? Sodium 141 135 - 145 mmol/L  ? Potassium 4.0 3.5 - 5.1 mmol/L  ? Chloride 104 98 - 111 mmol/L  ? CO2 27 22 - 32 mmol/L  ? Glucose, Bld 203 (H) 70 - 99 mg/dL  ?  Comment: Glucose reference range  applies only to samples taken after fasting for at least 8 hours.  ? BUN 19 6 - 20 mg/dL  ? Creatinine, Ser 0.52 0.44 - 1.00 mg/dL  ? Calcium 8.7 (L) 8.9 - 10.3 mg/dL  ? Total Protein 7.3 6.5 - 8.1 g/dL  ? Albumin 3.5 3.5 - 5.0 g/dL  ? AST 44 (H) 15 - 41 U/L  ? ALT 49 (H) 0 - 44 U/L  ? Alkaline Phosphatase 140 (H) 38 - 126 U/L  ? Total Bilirubin 1.1 0.3 - 1.2 mg/dL  ? GFR, Estimated >60 >60 mL/min  ?  Comment: (NOTE) ?Calculated using the CKD-EPI  Creatinine Equation (2021) ?  ? Anion gap 10 5 - 15  ?  Comment: Performed at [facility], [address], [place], [place] [address]  ?Lactic acid, plasma     Status: None  ? Collection Time: 10/08/21  5:50 PM  ?Result Value Ref Range  ? Lactic Acid, Venous 1.6 0.5 - 1.9 mmol/L  ?  Comment: Performed at [facility], [address], [place], [place] [address]  ?Procalcitonin - Baseline     Status: None  ? Collection Time: 10/08/21  5:50 PM  ?Result Value Ref Range  ? Procalcitonin <0.10 ng/mL  ?  Comment:        ?Interpretation: ?PCT (Procalcitonin) <= 0.5 ng/mL: ?Systemic infection (sepsis) is not likely. ?Local bacterial infection is possible. ?(NOTE) ?      Sepsis PCT Algorithm           Lower Respiratory Tract ?                                     Infection PCT Algorithm ?   ----------------------------     ---------------------------- ?        PCT < 0.25 ng/mL                PCT < 0.10 ng/mL ? ?        Strongly encourage             Strongly discourage ?  discontinuation of antibiotics    initiation of antibiotics ?   ----------------------------     ----------------------------- ?      PCT 0.25 - 0.50 ng/mL            PCT 0.10 - 0.25 ng/mL ?              OR ?      >80% decrease in PCT            Discourage initiation of ?                                           antibiotics ?     Encourage discontinuation ?          of antibiotics ?   ----------------------------     ----------------------------- ?        PCT >= 0.50 ng/mL              PCT 0.26  - 0.50 ng/mL ?              AND ?       <80% decrease in PCT             Encourage initiation of ?                                            antibiotics ?      Encourage continuation ?          of antibiot

## 2021-10-11 DIAGNOSIS — Q8711 Prader-Willi syndrome: Secondary | ICD-10-CM | POA: Diagnosis not present

## 2021-10-11 DIAGNOSIS — E1140 Type 2 diabetes mellitus with diabetic neuropathy, unspecified: Secondary | ICD-10-CM | POA: Diagnosis not present

## 2021-10-11 DIAGNOSIS — L03314 Cellulitis of groin: Secondary | ICD-10-CM | POA: Diagnosis not present

## 2021-10-11 LAB — GLUCOSE, CAPILLARY
Glucose-Capillary: 249 mg/dL — ABNORMAL HIGH (ref 70–99)
Glucose-Capillary: 287 mg/dL — ABNORMAL HIGH (ref 70–99)
Glucose-Capillary: 211 mg/dL — ABNORMAL HIGH (ref 70–99)
Glucose-Capillary: 151 mg/dL — ABNORMAL HIGH (ref 70–99)

## 2021-10-11 LAB — BASIC METABOLIC PANEL
Anion gap: 6 (ref 5–15)
BUN: 15 mg/dL (ref 6–20)
CO2: 29 mmol/L (ref 22–32)
Calcium: 8.7 mg/dL — ABNORMAL LOW (ref 8.9–10.3)
Chloride: 100 mmol/L (ref 98–111)
Creatinine, Ser: 0.46 mg/dL (ref 0.44–1.00)
GFR, Estimated: >60 mL/min (ref >60)
Glucose, Bld: 233 mg/dL — ABNORMAL HIGH (ref 70–99)
Potassium: 4.2 mmol/L (ref 3.5–5.1)
Sodium: 135 mmol/L (ref 135–145)

## 2021-10-11 MED ORDER — PNEUMOCOCCAL 20-VAL CONJ VACC 0.5 ML IM SUSY
0.5000 mL | PREFILLED_SYRINGE | INTRAMUSCULAR | Status: DC
  Filled 2021-10-11: qty 0.5

## 2021-10-11 MED ORDER — INSULIN GLARGINE-YFGN 100 UNIT/ML ~~LOC~~ SOLN
15.0000 [IU] | Freq: Every day | SUBCUTANEOUS | Status: DC
  Administered 2021-10-11: 15 [IU] via SUBCUTANEOUS
  Filled 2021-10-11 (×2): qty 0.15

## 2021-10-11 NOTE — Plan of Care (Signed)
?  Problem: Education: ?Goal: Knowledge of General Education information will improve ?Description: Including pain rating scale, medication(s)/side effects and non-pharmacologic comfort measures ?Outcome: Progressing ?  ?Problem: Health Behavior/Discharge Planning: ?Goal: Ability to manage health-related needs will improve ?Outcome: Progressing ?  ?Problem: Clinical Measurements: ?Goal: Will remain free from infection ?Outcome: Progressing ?  ?Problem: Clinical Measurements: ?Goal: Ability to avoid or minimize complications of infection will improve ?Outcome: Progressing ?  ?

## 2021-10-11 NOTE — Progress Notes (Signed)
Mobility Specialist - Progress Note ? ? ? 10/11/21 1400  ?Mobility  ?Activity Ambulated independently in hallway;Stood at bedside;Dangled on edge of bed  ?Level of Assistance Independent  ?Assistive Device None  ?Distance Ambulated (ft) 400 ft  ?Activity Response Tolerated well  ?$Mobility charge 1 Mobility  ? ? ?During mobility: 87 HR,  98% SpO2 ? ?Pt supine upon arrival using RA with family at bedside. Pt completes bed mobiltiy STS and ambulates 418ft indep voicing no complaints. ? ?Clarisa Schools ?Mobility Specialist ?10/11/21, 2:33 PM ? ? ? ? ?

## 2021-10-11 NOTE — Progress Notes (Addendum)
[facility] Progress Note ? ?Patient: Linda Mason    JSH:[id number]  DOA: 10/08/2021    ?Date of Service: the patient was seen and examined on 10/11/2021 ? ?Brief Mason course: ?26 year old female with past medical history of Prader-Willi syndrome, diabetes mellitus with secondary neuropathy, hypothyroidism and morbid obesity who presented to the emergency room on 4/5 with complaints of pain, erythema of her mons pubis and worsening edema x1 week.  In the emergency room, patient found to have cellulitis of the mons pubis.  CT scan was unrevealing.  Gynecology recommended IV antibiotics for several days and patient was brought in on the hospitalist service. ? ?Assessment and Plan: ?Assessment and Plan: ?* Cellulitis ?Cellulitis of mons pubis.  Started on IV vancomycin and Rocephin.  Blood cultures with no growth to date.  Clinically, cellulitis still prominent and with some mild drainage from wound.  OB evaluated patient on 4/7.  Although draining, no apparent area of abscess formation and drainage is most likely surrounding edema.  Patient continued on IV antibiotics. ? ?Diabetes mellitus with neuropathy (HCC) ?Continue sliding scale.  Checking A1c.  Continue Neurontin.  At home, patient on metformin, Jardiance and 50 units of Lantus.  Initially only on sliding scale and his CBGs have started to trend up, restarted Lantus at 8 units.  CBGs at lunchtime today at 289, so we will increase Lantus to 15 units. ? ?Essential hypertension ?Continue metoprolol ? ?Thrombocytopenia (HCC) ?Unclear etiology.  Could be secondary to hepatic cirrhosis.  At baseline.  Platelet count 66.  Monitor for bleeding. ? ?Hypothyroidism ?We will check TSH.  Continue Synthroid. ? ?Morbid obesity (HCC) ?Meets criteria with BMI greater than 35 plus comorbidities of diabetes and hypertension ? ? ? ? ? ? ?Body mass index is 38.96 kg/m?.  ?  ?   ? ?Consultants: ?GYN ? ?Procedures: ?None ? ?Antimicrobials: ?IV vancomycin and Rocephin  4/5-present ? ?Code Status: Full code ? ? ?Subjective: Patient still having some pain. ? ?Objective: ?Vital signs were reviewed and unremarkable. ?Vitals:  ? 10/11/21 0403 10/11/21 0803  ?BP: 118/83 116/87  ?Pulse: 67 81  ?Resp: 20 18  ?Temp: 97.7 ?F (36.5 ?C)   ?SpO2: 97% 97%  ? ? ?Intake/Output Summary (Last 24 hours) at 10/11/2021 1428 ?Last data filed at 10/11/2021 1415 ?Gross per 24 hour  ?Intake 1389.94 ml  ?Output --  ?Net 1389.94 ml  ? ? ?Filed Weights  ? 10/08/21 1738  ?Weight: 96.6 kg  ? ?Body mass index is 38.96 kg/m?. ? ?Exam: ? ?General: Alert and oriented x3, no acute distress ?HEENT: Normocephalic, atraumatic, mucous membranes are moist ?Cardiovascular: Regular rate and rhythm, S1-S2 ?Respiratory: Lungs clear to auscultation bilaterally ?Abdomen: Abdomen soft:, Nontender, nondistended, positive bowel sounds ?GU Mons pubis area with erythema, which is persistent although it looks to be slightly smaller than previous day.  Still tender.  Less drainage. ?Musculoskeletal: No clubbing or cyanosis, trace pitting edema ?Skin: No skin breaks, tears or lesions other than described above ?Psychiatry: Appropriate, no evidence of psychoses ?Neurology: No focal deficits ? ?Data Reviewed: ?Labs today reviewed and stable.  Noted rising CBGs ranging from low 215-287. ? ?Disposition:  ?Status is: Inpatient ?Continued need for IV antibiotics ?  ? ?Anticipated discharge date: 4/9 ? ?Remaining issues to be resolved so that patient can be discharged: Further improvement of cellulitis ? ? ?Family Communication: Family at bedside, translator used ?DVT Prophylaxis: ?Place and maintain sequential compression device Start: 10/08/21 2158 ? ? ? ?Author: ?Hollice Espy ,MD ?10/11/2021  2:28 PM ? ?To reach On-call, see care teams to locate the attending and reach out via [email or web address]. ?Between 7PM-7AM, please contact night-coverage ?If you still have difficulty reaching the attending provider, please page the [facility] (Director on Call)  for [facility] on amion for assistance. ? ?

## 2021-10-12 DIAGNOSIS — L03314 Cellulitis of groin: Secondary | ICD-10-CM | POA: Diagnosis not present

## 2021-10-12 DIAGNOSIS — Q8711 Prader-Willi syndrome: Secondary | ICD-10-CM | POA: Diagnosis not present

## 2021-10-12 DIAGNOSIS — E1140 Type 2 diabetes mellitus with diabetic neuropathy, unspecified: Secondary | ICD-10-CM | POA: Diagnosis not present

## 2021-10-12 LAB — GLUCOSE, CAPILLARY
Glucose-Capillary: 226 mg/dL — ABNORMAL HIGH (ref 70–99)
Glucose-Capillary: 237 mg/dL — ABNORMAL HIGH (ref 70–99)

## 2021-10-12 LAB — CREATININE, SERUM
Creatinine, Ser: 0.35 mg/dL — ABNORMAL LOW (ref 0.44–1.00)
GFR, Estimated: >60 mL/min (ref >60)

## 2021-10-12 MED ORDER — SULFAMETHOXAZOLE-TRIMETHOPRIM 800-160 MG PO TABS: 1.0000 | ORAL_TABLET | Freq: Two times a day (BID) | ORAL | 0 refills | Status: DC

## 2021-10-12 MED ORDER — LIDOCAINE-PRILOCAINE 2.5-2.5 % EX CREA: 1.0000 "application " | TOPICAL_CREAM | CUTANEOUS | 0 refills | Status: DC | PRN

## 2021-10-12 NOTE — Discharge Summary (Signed)
?Physician Discharge Summary ?  ?Patient: Linda Mason MRN: [id number] DOB: May 10, 1996  ?Admit date:     10/08/2021  ?Discharge date: 10/12/21  ?Discharge Physician: Hollice EspySendil K Kaelah Hayashi  ? ?PCP: [facility]  ? ?Recommendations at discharge:  ? ?New medication: Bactrim DS 1 p.o. twice daily x10 days ?New medication: Lidocaine gel applied topically as needed to affected area for pain ?Patient will follow-up with her PCP in the next 1 to 2 weeks for follow-up on cellulitis ? ?Discharge Diagnoses: ?Principal Problem: ?  Cellulitis ?Active Problems: ?  Diabetes mellitus with neuropathy (HCC) ?  Prader-Willi syndrome ?  Essential hypertension ?  Thrombocytopenia (HCC) ?  Hypothyroidism ?  Morbid obesity (HCC) ? ?Resolved Problems: ?  * No resolved hospital problems. * ? ?Hospital Course: ?26 year old female with past medical history of Prader-Willi syndrome, diabetes mellitus with secondary neuropathy, hypothyroidism and morbid obesity who presented to the emergency room on 4/5 with complaints of pain, erythema of her mons pubis and worsening edema x1 week.  In the emergency room, patient found to have cellulitis of the mons pubis.  CT scan was unrevealing.  Gynecology recommended IV antibiotics for several days and patient was brought in on the hospitalist service. ? ?Assessment and Plan: ?* Cellulitis ?Cellulitis of mons pubis.  Started on IV vancomycin and Rocephin.  Blood cultures with no growth to date.  Clinically, cellulitis still prominent and with some mild drainage from wound.  OB evaluated patient on 4/7.  Although draining, no apparent area of abscess formation and drainage is most likely surrounding edema.  Patient continued on IV antibiotics.  By 4/9, area noted improvement.  In discussion with family through translator, plan will be for patient to be discharged home and continue p.o. Bactrim for 10 more days.  She will follow-up with her PCP in the next 1 to 2 weeks.  Given  also prescription for lidocaine gel topically as needed for pain ? ?Diabetes mellitus with neuropathy (HCC) ?Continue sliding scale.  Checking A1c.  Continue Neurontin.  At home, patient on metformin, Jardiance and 50 units of Lantus.  Initially only on sliding scale and his CBGs have started to trend up, restarted Lantus at 8 units and slowly have been increasing the dose as CBGs have trended upward.  Patient will resume her home dose of Lantus upon discharge. ? ?Essential hypertension ?Continue metoprolol ? ?Thrombocytopenia (HCC) ?Unclear etiology.  Could be secondary to hepatic cirrhosis.  Platelet count ranging between 50s to 60s, although this is chronic and at her baseline.  No bleeding during hospitalization ? ?Hypothyroidism ?TSH within normal limits.  Continue Synthroid. ? ?Morbid obesity (HCC) ?Meets criteria with BMI greater than 35 plus comorbidities of diabetes and hypertension ? ? ? ? ?  ? ? ?Consultants: GYN ?Procedures performed: None ?Disposition: Home ?Diet recommendation:  ?Discharge Diet Orders (From admission, onward)  ? ?  Start     Ordered  ? 10/12/21 0000  Diet - low sodium heart healthy       ? 10/12/21 1401  ? ?  ?  ? ?  ? ?Carb modified diet ?DISCHARGE MEDICATION: ?Allergies as of 10/12/2021   ?No Known Allergies ?  ? ?  ?Medication List  ?  ? ?TAKE these medications   ? ?gabapentin 100 MG capsule ?Commonly known as: Neurontin ?Take 1 capsule (100 mg total) by mouth 3 (three) times daily. ?  ?insulin aspart 100 UNIT/ML injection ?Commonly known as: novoLOG ?Inject 4 Units into the skin  3 (three) times daily with meals. ?  ?insulin glargine 100 UNIT/ML injection ?Commonly known as: LANTUS ?Inject 50 Units into the skin at bedtime. ?  ?Jardiance 25 MG Tabs tablet ?Generic drug: empagliflozin ?Take 25 mg by mouth daily. ?  ?levothyroxine 50 MCG tablet ?Commonly known as: SYNTHROID ?Take 50 mcg by mouth daily before breakfast. ?  ?lidocaine-prilocaine cream ?Commonly known as: EMLA ?Apply 1  application. topically as needed. To affected area for pain ?  ?lisinopril 40 MG tablet ?Commonly known as: ZESTRIL ?Take 40 mg by mouth daily. ?  ?metFORMIN 500 MG tablet ?Commonly known as: GLUCOPHAGE ?Take 1,000 mg by mouth 2 (two) times daily with a meal. ?  ?metoprolol succinate 25 MG 24 hr tablet ?Commonly known as: TOPROL-XL ?Take 25 mg by mouth daily. ?  ?omeprazole 40 MG capsule ?Commonly known as: PRILOSEC ?Take 40 mg by mouth daily. ?  ?sulfamethoxazole-trimethoprim 800-160 MG tablet ?Commonly known as: BACTRIM DS ?Take 1 tablet by mouth 2 (two) times daily. ?  ? ?  ? ? Follow-up Information   ? ? [facility]. Schedule an appointment as soon as possible for a visit in 1 week(s).   ?Specialty: General Practice ?Contact information: ?[address] ?[place] [address] ?[phone] ? ? ?  ?  ? ?  ?  ? ?  ? ?Discharge Exam: ?Ceasar Mons Weights  ? 10/08/21 1738  ?Weight: 96.6 kg  ? ?General: Alert and oriented x2 ?Skin: Mons pubis area of cellulitis notes some improvement in cellulitis although still present.  No drainage. ? ?Condition at discharge: improving ? ?The results of significant diagnostics from this hospitalization (including imaging, microbiology, ancillary and laboratory) are listed below for reference.  ? ?Imaging Studies: ?CT ABDOMEN PELVIS W CONTRAST ? ?Result Date: 10/08/2021 ?CLINICAL DATA:  Acute nonlocalized abdominal pain. Abscess to the vaginal area, first noticed yesterday. EXAM: CT ABDOMEN AND PELVIS WITH CONTRAST TECHNIQUE: Multidetector CT imaging of the abdomen and pelvis was performed using the standard protocol following bolus administration of intravenous contrast. RADIATION DOSE REDUCTION: This exam was performed according to the departmental dose-optimization program which includes automated exposure control, adjustment of the mA and/or kV according to patient size and/or use of iterative reconstruction technique. CONTRAST:  OMNIPAQUE  IOHEXOL 300 MG/ML  SOLN COMPARISON:  None. FINDINGS: Lower chest: Lung bases are clear. Prominent lymph nodes in the right cardiophrenic angle, measuring up to 9 mm diameter. Hepatobiliary: Nodular contour to the liver suggest hepatic cirrhosis. No focal lesions. Gallbladder and bile ducts are normal. Pancreas: Unremarkable. No pancreatic ductal dilatation or surrounding inflammatory changes. Spleen: Diffuse splenic enlargement.  No focal lesions. Adrenals/Urinary Tract: Adrenal glands are unremarkable. Kidneys are normal, without renal calculi, focal lesion, or hydronephrosis. Bladder is unremarkable. Stomach/Bowel: Stomach, small bowel, and colon are not abnormally distended. No wall thickening or inflammatory changes. Appendix is not identified. Vascular/Lymphatic: Normal caliber abdominal aorta. Scattered celiac axis, mesenteric, and retroperitoneal lymph nodes without pathologic enlargement, likely reactive. Reproductive: No abnormal pelvic mass or collection. No vaginal soft tissue collections are identified. Other: No free air or free fluid in the abdomen. Abdominal wall musculature appears intact. Musculoskeletal: Thoracolumbar scoliosis convex towards the left. No acute bony abnormalities. IMPRESSION: 1. No focal collections identified. 2. Prominent lymph nodes in the right cardiophrenic angle, likely reactive. 3. 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 Electronically Signed   By: Burman Nieves M.D.   On: 10/08/2021 19:39   ? ?Microbiology: ?Results for orders placed or  performed during the hospital encounter of 10/08/21  ?Resp Panel by RT-PCR (Flu A&B, Covid) Nasopharyngeal Swab     Status: None  ? Collection Time: 10/08/21  5:52 PM  ? Specimen: Nasopharyngeal Swab; Nasopharyngeal(NP) swabs in vial transport medium  ?Result Value Ref Range Status  ? SARS Coronavirus 2 by RT PCR NEGATIVE NEGATIVE Final  ?  Comment: (NOTE) ?SARS-CoV-2 target nucleic acids are NOT DETECTED. ? ?The SARS-CoV-2  RNA is generally detectable in upper respiratory ?specimens during the acute phase of infection. The lowest ?concentration of SARS-CoV-2 viral copies this assay can detect is ?138 copies/mL. A negative resul

## 2021-10-13 LAB — CULTURE, BLOOD (ROUTINE X 2)
Culture: NO GROWTH
Culture: NO GROWTH
Special Requests: ADEQUATE
Special Requests: ADEQUATE

## 2023-08-26 ENCOUNTER — Emergency Department: Payer: MEDICAID

## 2023-08-26 ENCOUNTER — Observation Stay: Payer: MEDICAID | Admitting: Anesthesiology

## 2023-08-26 ENCOUNTER — Inpatient Hospital Stay
Admission: EM | Admit: 2023-08-26 | Discharge: 2023-09-01 | DRG: 488 | Disposition: A | Discharge status: Rehabilitation Facility | Payer: MEDICAID | Attending: Internal Medicine | Admitting: Internal Medicine

## 2023-08-26 ENCOUNTER — Observation Stay: Payer: MEDICAID

## 2023-08-26 ENCOUNTER — Other Ambulatory Visit: Payer: Self-pay

## 2023-08-26 ENCOUNTER — Encounter
Admission: EM | Disposition: A | Discharge status: Rehabilitation Facility | Payer: Self-pay | Source: Home / Self Care | Attending: Internal Medicine

## 2023-08-26 ENCOUNTER — Encounter: Payer: Self-pay | Admitting: Emergency Medicine

## 2023-08-26 DIAGNOSIS — E1165 Type 2 diabetes mellitus with hyperglycemia: Secondary | ICD-10-CM | POA: Diagnosis present

## 2023-08-26 DIAGNOSIS — I10 Essential (primary) hypertension: Secondary | ICD-10-CM | POA: Diagnosis present

## 2023-08-26 DIAGNOSIS — Z794 Long term (current) use of insulin: Secondary | ICD-10-CM

## 2023-08-26 DIAGNOSIS — E1140 Type 2 diabetes mellitus with diabetic neuropathy, unspecified: Secondary | ICD-10-CM | POA: Diagnosis present

## 2023-08-26 DIAGNOSIS — Z79890 Hormone replacement therapy (postmenopausal): Secondary | ICD-10-CM

## 2023-08-26 DIAGNOSIS — E119 Type 2 diabetes mellitus without complications: Secondary | ICD-10-CM

## 2023-08-26 DIAGNOSIS — Z603 Acculturation difficulty: Secondary | ICD-10-CM | POA: Diagnosis present

## 2023-08-26 DIAGNOSIS — Z1152 Encounter for screening for COVID-19: Secondary | ICD-10-CM

## 2023-08-26 DIAGNOSIS — E875 Hyperkalemia: Secondary | ICD-10-CM | POA: Diagnosis not present

## 2023-08-26 DIAGNOSIS — R000 Tachycardia, unspecified: Secondary | ICD-10-CM | POA: Diagnosis present

## 2023-08-26 DIAGNOSIS — S8991XA Unspecified injury of right lower leg, initial encounter: Secondary | ICD-10-CM

## 2023-08-26 DIAGNOSIS — R0902 Hypoxemia: Secondary | ICD-10-CM | POA: Diagnosis present

## 2023-08-26 DIAGNOSIS — E88819 Insulin resistance, unspecified: Secondary | ICD-10-CM | POA: Diagnosis present

## 2023-08-26 DIAGNOSIS — W06XXXA Fall from bed, initial encounter: Secondary | ICD-10-CM | POA: Diagnosis present

## 2023-08-26 DIAGNOSIS — Z7984 Long term (current) use of oral hypoglycemic drugs: Secondary | ICD-10-CM

## 2023-08-26 DIAGNOSIS — Z79899 Other long term (current) drug therapy: Secondary | ICD-10-CM

## 2023-08-26 DIAGNOSIS — S82001A Unspecified fracture of right patella, initial encounter for closed fracture: Secondary | ICD-10-CM | POA: Diagnosis present

## 2023-08-26 DIAGNOSIS — S83006A Unspecified dislocation of unspecified patella, initial encounter: Secondary | ICD-10-CM | POA: Diagnosis present

## 2023-08-26 DIAGNOSIS — Z6841 Body Mass Index (BMI) 40.0 and over, adult: Secondary | ICD-10-CM

## 2023-08-26 DIAGNOSIS — R509 Fever, unspecified: Secondary | ICD-10-CM | POA: Diagnosis present

## 2023-08-26 DIAGNOSIS — S83014A Lateral dislocation of right patella, initial encounter: Principal | ICD-10-CM | POA: Diagnosis present

## 2023-08-26 DIAGNOSIS — E039 Hypothyroidism, unspecified: Secondary | ICD-10-CM | POA: Diagnosis present

## 2023-08-26 DIAGNOSIS — W19XXXA Unspecified fall, initial encounter: Principal | ICD-10-CM

## 2023-08-26 DIAGNOSIS — D696 Thrombocytopenia, unspecified: Secondary | ICD-10-CM | POA: Diagnosis present

## 2023-08-26 DIAGNOSIS — Q8711 Prader-Willi syndrome: Secondary | ICD-10-CM

## 2023-08-26 LAB — COMPREHENSIVE METABOLIC PANEL
ALT: 74 U/L — ABNORMAL HIGH (ref 0–44)
AST: 57 U/L — ABNORMAL HIGH (ref 15–41)
Albumin: 3.3 g/dL — ABNORMAL LOW (ref 3.5–5.0)
Alkaline Phosphatase: 123 U/L (ref 38–126)
Anion gap: 12 (ref 5–15)
BUN: 17 mg/dL (ref 6–20)
CO2: 21 mmol/L — ABNORMAL LOW (ref 22–32)
Calcium: 8.9 mg/dL (ref 8.9–10.3)
Chloride: 102 mmol/L (ref 98–111)
Creatinine, Ser: 0.37 mg/dL — ABNORMAL LOW (ref 0.44–1.00)
GFR, Estimated: >60 mL/min (ref >60)
Glucose, Bld: 153 mg/dL — ABNORMAL HIGH (ref 70–99)
Potassium: 4.5 mmol/L (ref 3.5–5.1)
Sodium: 135 mmol/L (ref 135–145)
Total Bilirubin: 2.4 mg/dL — ABNORMAL HIGH (ref 0.0–1.2)
Total Protein: 6.9 g/dL (ref 6.5–8.1)

## 2023-08-26 LAB — CBC
HCT: 42.8 % (ref 36.0–46.0)
Hemoglobin: 13.9 g/dL (ref 12.0–15.0)
MCH: 30.9 pg (ref 26.0–34.0)
MCHC: 32.5 g/dL (ref 30.0–36.0)
MCV: 95.1 fL (ref 80.0–100.0)
Platelets: 54 10*3/uL — ABNORMAL LOW (ref 150–400)
RBC: 4.5 MIL/uL (ref 3.87–5.11)
RDW: 13.9 % (ref 11.5–15.5)
WBC: 9.6 10*3/uL (ref 4.0–10.5)
nRBC: 0 % (ref 0.0–0.2)

## 2023-08-26 LAB — URINALYSIS, COMPLETE (UACMP) WITH MICROSCOPIC
Bacteria, UA: NONE SEEN
Bilirubin Urine: NEGATIVE
Glucose, UA: >=500 mg/dL — AB
Hgb urine dipstick: NEGATIVE
Ketones, ur: 20 mg/dL — AB
Leukocytes,Ua: NEGATIVE
Nitrite: NEGATIVE
Protein, ur: NEGATIVE mg/dL
Specific Gravity, Urine: 1.027 (ref 1.005–1.030)
pH: 5 (ref 5.0–8.0)

## 2023-08-26 LAB — RESP PANEL BY RT-PCR (RSV, FLU A&B, COVID)  RVPGX2
Influenza A by PCR: NEGATIVE
Influenza B by PCR: NEGATIVE
Resp Syncytial Virus by PCR: NEGATIVE
SARS Coronavirus 2 by RT PCR: NEGATIVE

## 2023-08-26 LAB — MAGNESIUM: Magnesium: 1.9 mg/dL (ref 1.7–2.4)

## 2023-08-26 LAB — BLOOD GAS, VENOUS
Acid-Base Excess: 1.5 mmol/L (ref 0.0–2.0)
Bicarbonate: 27.2 mmol/L (ref 20.0–28.0)
O2 Saturation: 94.7 %
Patient temperature: 37
pCO2, Ven: 46 mm[Hg] (ref 44–60)
pH, Ven: 7.38 (ref 7.25–7.43)
pO2, Ven: 66 mm[Hg] — ABNORMAL HIGH (ref 32–45)

## 2023-08-26 LAB — LACTIC ACID, PLASMA
Lactic Acid, Venous: 1.4 mmol/L (ref 0.5–1.9)
Lactic Acid, Venous: 1.3 mmol/L (ref 0.5–1.9)

## 2023-08-26 LAB — CBG MONITORING, ED: Glucose-Capillary: 146 mg/dL — ABNORMAL HIGH (ref 70–99)

## 2023-08-26 LAB — POC URINE PREG, ED: Preg Test, Ur: NEGATIVE

## 2023-08-26 LAB — PROCALCITONIN: Procalcitonin: 0.38 ng/mL

## 2023-08-26 LAB — GLUCOSE, CAPILLARY: Glucose-Capillary: 188 mg/dL — ABNORMAL HIGH (ref 70–99)

## 2023-08-26 SURGERY — ARTHROSCOPY, KNEE
Anesthesia: General

## 2023-08-26 MED ORDER — NEOMYCIN-POLYMYXIN B GU 40-200000 IR SOLN
Status: AC
  Filled 2023-08-26: qty 4

## 2023-08-26 MED ORDER — HYDROMORPHONE HCL 1 MG/ML IJ SOLN
1.0000 mg | Freq: Once | INTRAMUSCULAR | Status: AC
  Administered 2023-08-26: 1 mg via INTRAMUSCULAR
  Filled 2023-08-26: qty 1

## 2023-08-26 MED ORDER — FENTANYL CITRATE PF 50 MCG/ML IJ SOSY
100.0000 ug | PREFILLED_SYRINGE | Freq: Once | INTRAMUSCULAR | Status: AC
  Administered 2023-08-26: 100 ug via NASAL
  Filled 2023-08-26: qty 2

## 2023-08-26 MED ORDER — INSULIN ASPART 100 UNIT/ML IJ SOLN
0.0000 [IU] | Freq: Every day | INTRAMUSCULAR | Status: DC
Start: 2023-08-26 — End: 2023-08-27

## 2023-08-26 MED ORDER — HYDROCODONE-ACETAMINOPHEN 5-325 MG PO TABS
1.0000 | ORAL_TABLET | Freq: Four times a day (QID) | ORAL | Status: DC | PRN
Start: 2023-08-26 — End: 2023-08-27
  Administered 2023-08-26: 2 via ORAL
  Filled 2023-08-26: qty 2

## 2023-08-26 MED ORDER — ENOXAPARIN SODIUM 40 MG/0.4ML IJ SOSY: 40.0000 mg | PREFILLED_SYRINGE | INTRAMUSCULAR | Status: DC

## 2023-08-26 MED ORDER — BISACODYL 5 MG PO TBEC
5.0000 mg | DELAYED_RELEASE_TABLET | Freq: Every day | ORAL | Status: DC | PRN
  Administered 2023-08-31: 5 mg via ORAL
  Filled 2023-08-26: qty 1

## 2023-08-26 MED ORDER — KETOROLAC TROMETHAMINE 15 MG/ML IJ SOLN
15.0000 mg | Freq: Four times a day (QID) | INTRAMUSCULAR | Status: DC
  Administered 2023-08-26 – 2023-08-27 (×3): 15 mg via INTRAVENOUS
  Filled 2023-08-26 (×2): qty 1

## 2023-08-26 MED ORDER — KETOROLAC TROMETHAMINE 15 MG/ML IJ SOLN
INTRAMUSCULAR | Status: AC
  Filled 2023-08-26: qty 1

## 2023-08-26 MED ORDER — ACETAMINOPHEN 10 MG/ML IV SOLN: 1000.0000 mg | Freq: Once | INTRAVENOUS | Status: DC | PRN

## 2023-08-26 MED ORDER — FENTANYL CITRATE PF 50 MCG/ML IJ SOSY
50.0000 ug | PREFILLED_SYRINGE | Freq: Once | INTRAMUSCULAR | Status: AC
  Administered 2023-08-26: 50 ug via NASAL
  Filled 2023-08-26: qty 1

## 2023-08-26 MED ORDER — LIDOCAINE HCL (PF) 2 % IJ SOLN
INTRAMUSCULAR | Status: AC
  Filled 2023-08-26: qty 5

## 2023-08-26 MED ORDER — MIDAZOLAM HCL 2 MG/2ML IJ SOLN
INTRAMUSCULAR | Status: AC
  Filled 2023-08-26: qty 2

## 2023-08-26 MED ORDER — FENTANYL CITRATE (PF) 100 MCG/2ML IJ SOLN: 25.0000 ug | INTRAMUSCULAR | Status: DC | PRN

## 2023-08-26 MED ORDER — OXYCODONE HCL 5 MG/5ML PO SOLN: 5.0000 mg | Freq: Once | ORAL | Status: DC | PRN

## 2023-08-26 MED ORDER — MORPHINE SULFATE (PF) 4 MG/ML IV SOLN
4.0000 mg | Freq: Once | INTRAVENOUS | Status: AC
  Administered 2023-08-26: 4 mg via INTRAMUSCULAR

## 2023-08-26 MED ORDER — METHOCARBAMOL 1000 MG/10ML IJ SOLN: 500.0000 mg | Freq: Four times a day (QID) | INTRAMUSCULAR | Status: DC | PRN

## 2023-08-26 MED ORDER — OXYCODONE HCL 5 MG PO TABS: 5.0000 mg | ORAL_TABLET | Freq: Once | ORAL | Status: DC | PRN

## 2023-08-26 MED ORDER — MIDAZOLAM HCL 2 MG/2ML IJ SOLN
INTRAMUSCULAR | Status: DC | PRN
  Administered 2023-08-26: 2 mg via INTRAVENOUS

## 2023-08-26 MED ORDER — METHOCARBAMOL 500 MG PO TABS
500.0000 mg | ORAL_TABLET | Freq: Four times a day (QID) | ORAL | Status: DC | PRN
  Administered 2023-08-27 – 2023-08-29 (×2): 500 mg via ORAL
  Filled 2023-08-26 (×2): qty 1

## 2023-08-26 MED ORDER — MORPHINE SULFATE (PF) 4 MG/ML IV SOLN
4.0000 mg | Freq: Once | INTRAVENOUS | Status: DC
  Filled 2023-08-26: qty 1

## 2023-08-26 MED ORDER — INSULIN ASPART 100 UNIT/ML IJ SOLN: 0.0000 [IU] | Freq: Three times a day (TID) | INTRAMUSCULAR | Status: DC

## 2023-08-26 MED ORDER — BUPIVACAINE HCL (PF) 0.5 % IJ SOLN
INTRAMUSCULAR | Status: AC
  Filled 2023-08-26: qty 30

## 2023-08-26 MED ORDER — FENTANYL CITRATE (PF) 100 MCG/2ML IJ SOLN
INTRAMUSCULAR | Status: AC
  Filled 2023-08-26: qty 2

## 2023-08-26 MED ORDER — ONDANSETRON HCL 4 MG/2ML IJ SOLN
INTRAMUSCULAR | Status: AC
  Filled 2023-08-26: qty 2

## 2023-08-26 MED ORDER — CEFAZOLIN SODIUM-DEXTROSE 3-4 GM/150ML-% IV SOLN
3.0000 g | Freq: Once | INTRAVENOUS | Status: DC
  Filled 2023-08-26: qty 150

## 2023-08-26 MED ORDER — BUPIVACAINE LIPOSOME 1.3 % IJ SUSP
INTRAMUSCULAR | Status: AC
  Filled 2023-08-26: qty 20

## 2023-08-26 MED ORDER — KETAMINE HCL 50 MG/5ML IJ SOSY
1.0000 mg/kg | PREFILLED_SYRINGE | Freq: Once | INTRAMUSCULAR | Status: AC
  Administered 2023-08-26: 127 mg via INTRAVENOUS
  Filled 2023-08-26: qty 15

## 2023-08-26 MED ORDER — ROCURONIUM BROMIDE 10 MG/ML (PF) SYRINGE
PREFILLED_SYRINGE | INTRAVENOUS | Status: AC
  Filled 2023-08-26: qty 10

## 2023-08-26 MED ORDER — PROPOFOL 10 MG/ML IV BOLUS
INTRAVENOUS | Status: AC
  Filled 2023-08-26: qty 20

## 2023-08-26 MED ORDER — HYDROMORPHONE HCL 1 MG/ML IJ SOLN
0.5000 mg | INTRAMUSCULAR | Status: DC | PRN
  Administered 2023-08-26 – 2023-08-27 (×2): 0.5 mg via INTRAVENOUS
  Filled 2023-08-26 (×2): qty 0.5

## 2023-08-26 MED ORDER — MUPIROCIN 2 % EX OINT
1.0000 | TOPICAL_OINTMENT | Freq: Two times a day (BID) | CUTANEOUS | Status: DC
  Administered 2023-08-27 – 2023-08-31 (×9): 1 via NASAL
  Filled 2023-08-26: qty 22

## 2023-08-26 MED ORDER — DEXAMETHASONE SODIUM PHOSPHATE 10 MG/ML IJ SOLN
INTRAMUSCULAR | Status: AC
  Filled 2023-08-26: qty 1

## 2023-08-26 MED ORDER — SENNOSIDES-DOCUSATE SODIUM 8.6-50 MG PO TABS
1.0000 | ORAL_TABLET | Freq: Every evening | ORAL | Status: DC | PRN
  Administered 2023-08-26: 1 via ORAL
  Filled 2023-08-26: qty 1

## 2023-08-26 MED ORDER — MIDAZOLAM HCL 2 MG/2ML IJ SOLN
2.0000 mg | Freq: Once | INTRAMUSCULAR | Status: AC | PRN
  Administered 2023-08-26: 2 mg via NASAL
  Filled 2023-08-26: qty 2

## 2023-08-26 MED ORDER — DROPERIDOL 2.5 MG/ML IJ SOLN: 0.6250 mg | Freq: Once | INTRAMUSCULAR | Status: DC | PRN

## 2023-08-26 MED ORDER — SODIUM CHLORIDE 0.9 % IV BOLUS
1000.0000 mL | Freq: Once | INTRAVENOUS | Status: AC
  Administered 2023-08-26: 1000 mL via INTRAVENOUS

## 2023-08-26 SURGICAL SUPPLY — 44 items
ADAPTER IRRIG TUBE 2 SPIKE SOL (ADAPTER) ×2 IMPLANT
BLADE SAW SGTL 13X75X1.27 (BLADE) ×1 IMPLANT
BLADE SHAVER 4.5X7 STR FR (MISCELLANEOUS) IMPLANT
BLADE SURG 15 STRL LF DISP TIS (BLADE) ×1 IMPLANT
BLADE SURG SZ11 CARB STEEL (BLADE) ×1 IMPLANT
BNDG ESMARCH 6X12 STRL LF (GAUZE/BANDAGES/DRESSINGS) IMPLANT
BUR BR 5.5 WIDE MOUTH (BURR) IMPLANT
CHLORAPREP W/TINT 26 (MISCELLANEOUS) ×1 IMPLANT
COOLER POLAR GLACIER W/PUMP (MISCELLANEOUS) ×1 IMPLANT
CUFF TRNQT CYL 24X4X16.5-23 (TOURNIQUET CUFF) IMPLANT
CUFF TRNQT CYL 34X4.125X (TOURNIQUET CUFF) IMPLANT
DRAPE ARTHROSCOPY W/POUCH 90 (DRAPES) ×1 IMPLANT
DRAPE C-ARM XRAY 36X54 (DRAPES) ×1 IMPLANT
DRAPE C-ARMOR (DRAPES) ×1 IMPLANT
DRAPE IMP U-DRAPE 54X76 (DRAPES) ×1 IMPLANT
GAUZE SPONGE 4X4 12PLY STRL (GAUZE/BANDAGES/DRESSINGS) ×1 IMPLANT
GLOVE BIOGEL PI IND STRL 8 (GLOVE) ×2 IMPLANT
GLOVE SURG ORTHO 8.0 STRL STRW (GLOVE) ×1 IMPLANT
GLOVE SURG SYN 7.5 E (GLOVE) IMPLANT
GLOVE SURG SYN 7.5 PF PI (GLOVE) ×1 IMPLANT
GOWN STRL REUS W/ TWL LRG LVL3 (GOWN DISPOSABLE) ×1 IMPLANT
IV LR IRRIG 3000ML ARTHROMATIC (IV SOLUTION) ×4 IMPLANT
KIT TURNOVER KIT A (KITS) ×1 IMPLANT
MANIFOLD NEPTUNE II (INSTRUMENTS) ×2 IMPLANT
MAT ABSORB FLUID 56X50 GRAY (MISCELLANEOUS) ×1 IMPLANT
NDL HYPO 22X1.5 SAFETY MO (MISCELLANEOUS) ×1 IMPLANT
NDL SUT 5 .5 CRC TPR PNT MAYO (NEEDLE) ×1 IMPLANT
NEEDLE HYPO 22X1.5 SAFETY MO (MISCELLANEOUS) IMPLANT
PACK ARTHROSCOPY KNEE (MISCELLANEOUS) ×1 IMPLANT
PAD ABD DERMACEA PRESS 5X9 (GAUZE/BANDAGES/DRESSINGS) ×2 IMPLANT
PAD WRAPON POLAR KNEE (MISCELLANEOUS) ×1 IMPLANT
SLEEVE REMOTE CONTROL 5X12 (DRAPES) IMPLANT
SPONGE T-LAP 18X18 ~~LOC~~+RFID (SPONGE) ×1 IMPLANT
SUT BONE WAX W31G (SUTURE) ×1 IMPLANT
SUT ETHILON 3-0 FS-10 30 BLK (SUTURE) IMPLANT
SUT MNCRL AB 4-0 PS2 18 (SUTURE) ×1 IMPLANT
SUT VIC AB 2-0 CT1 (SUTURE) ×1 IMPLANT
SUTURE EHLN 3-0 FS-10 30 BLK (SUTURE) ×1 IMPLANT
TRAP FLUID SMOKE EVACUATOR (MISCELLANEOUS) ×1 IMPLANT
TUBE SET DOUBLEFLO INFLOW (TUBING) ×1 IMPLANT
TUBE SET DOUBLEFLO OUTFLOW (TUBING) ×1 IMPLANT
WAND WEREWOLF FLOW 90D (MISCELLANEOUS) ×1 IMPLANT
WATER STERILE IRR 500ML POUR (IV SOLUTION) ×2 IMPLANT
WRAPON POLAR PAD KNEE (MISCELLANEOUS) IMPLANT

## 2023-08-26 NOTE — Progress Notes (Signed)
 Patient arrived to pacu awake/alert x4. ST 118-120, sats on 2liters [place] 97-98%  Oral temp upon arrival to pacu 97.9.  chest film completed in pacu.

## 2023-08-26 NOTE — Progress Notes (Signed)
 Orthopedic history called again patient obviously developed tachycardia and hypoxic during anesthesia and surgery has to be abandoned.  As per orthopedic surgery's request, patient will be transferred to under hospitalist care.  Went back to see the patient patient does not have significant complaint of shortness of breath, she has no urinary symptoms or diarrhea.  Will check basic workups including blood culture, CBC CMP, chest x-ray UA.

## 2023-08-26 NOTE — ED Provider Notes (Signed)
 [facility] Provider Note    Event Date/Time   First MD Initiated Contact with Patient 08/26/23 [phone]     (approximate)   History   Fall   HPI  Linda Mason is a 28 y.o. female   Past medical history of Prader-Willi syndrome, intellectual delay and diabetes who presents to the Emergency Department with her mother and brother after she experienced a fall after getting out of bed this evening.  She states that she was getting out of bed and slipped and fell injuring her right lower extremity.  There was no head strike.  No other trauma or injury noted.  She is otherwise been in her regular state of health with no other acute medical complaints per patient and family members at bedside.  Independent Historian contributed to assessment above: Both mother and brother at bedside to corroborate information past medical history as above    Physical Exam   Triage Vital Signs: ED Triage Vitals  Encounter Vitals Group     BP      Systolic BP Percentile      Diastolic BP Percentile      Pulse      Resp      Temp      Temp src      SpO2      Weight      Height      Head Circumference      Peak Flow      Pain Score      Pain Loc      Pain Education      Exclude from Growth Chart     Most recent vital signs: Vitals:   08/26/23 0342  BP: (!) 129/113  Pulse: 88  Resp: 16  Temp: 97.7 F (36.5 C)  SpO2: 98%    General: Awake, no distress.  CV:  Good peripheral perfusion.  Resp:  Normal effort.  Abd:  No distention.  Other:  The patient has shortened and externally rotated right lower extremity status neurovascular intact.  She has tenderness to palpation around the knee and hip.  Head to toe examination reveals no signs of trauma to head, neck, thorax, back, abdomen or other extremities.   ED Results / Procedures / Treatments   Labs (all labs ordered are listed, but only abnormal results are displayed) Labs Reviewed  POC URINE PREG, ED    RADIOLOGY I independently reviewed and interpreted x-ray of the right knee and see no obvious fracture I also reviewed radiologist's formal read.   PROCEDURES:  Critical Care performed: No  Procedures   MEDICATIONS ORDERED IN ED: Medications  fentaNYL (SUBLIMAZE) injection 100 mcg (100 mcg Nasal Given 08/26/23 0405)  midazolam (VERSED) injection 2 mg (2 mg Nasal Given 08/26/23 0647)  fentaNYL (SUBLIMAZE) injection 50 mcg (50 mcg Nasal Given 08/26/23 3086)     IMPRESSION / MDM / ASSESSMENT AND PLAN / ED COURSE  I reviewed the triage vital signs and the nursing notes.                                Patient's presentation is most consistent with acute presentation with potential threat to life or bodily function.  Differential diagnosis includes, but is not limited to, hip or knee fracture dislocation, ligamentous injury or sprains  MDM:   Patient with a mechanical slip and fall and right lower extremity injury.  Consider hip or  knee injury and will image both areas.  RT has notified us requesting a pregnancy test before hip x-ray however given the patient's intellectual delay and medical comorbidities, patient's mother and brother electing for foregoing the pregnancy test and proceeding with imaging and I think this is reasonable.  -- X-ray read with questionable lateral displacement of the patella and questionable fracture of the patella, hip x-ray is unremarkable.  Given intranasal Versed for anxiolysis the reduction was attempted by flexing of the hip and extending of the knee and pushing from the lateral side medially on the patella.  Patient was able to fully extend the extremity and was placed in a knee immobilizer.  I did not feel an obvious relocation of the patella.  I consulted with Dr Audelia Acton of orthopedics who recommends CT scan of the knee to further characterize the potential fracture, and to ensure no remaining dislocation which would be very unusual given the  treatment position of the lower extremity knee immobilizer currently.  Reassessment neurovascular intact.  Plan is for CT scan, knee immobilizer, discharge if CT scan shows no dislocation.      FINAL CLINICAL IMPRESSION(S) / ED DIAGNOSES   Final diagnoses:  Fall, initial encounter  Right knee injury, initial encounter     Rx / DC Orders   ED Discharge Orders     None        Note:  This document was prepared using Dragon voice recognition software and may include unintentional dictation errors.    Pilar Jarvis, MD 08/26/23 [phone]

## 2023-08-26 NOTE — ED Notes (Signed)
 The pt advised while getting ready to administer her pain med that she had urinated on herself. The pt was asked if she would be able to tolerate me rolling her over. The pt advised no. I told her we would give her some time for the pain med to work and then try to clean her up. The pt advised she was ok with that.

## 2023-08-26 NOTE — ED Notes (Signed)
 Patient transported to X-ray

## 2023-08-26 NOTE — Progress Notes (Addendum)
 Surgery not performed due to tachycardia, O2 sats dropping into 80s on RA, and temperature of 101.4 prior to induction of anesthesia.   Recommendation by Anesthesia team for further workup of these findings with goal of returning to OR tomorrow, 08/27/23.   I have discussed these findings with Hospitalist team who has initiated workup.

## 2023-08-26 NOTE — Anesthesia Preprocedure Evaluation (Addendum)
 Anesthesia Evaluation  Patient identified by MRN, date of birth, ID band Patient awake    Reviewed: Allergy & Precautions, H&P , NPO status , Patient's Chart, lab work & pertinent test results  Airway Mallampati: IV  TM Distance: >3 FB Neck ROM: full    Dental  (+) Poor Dentition, Missing   Pulmonary neg pulmonary ROS   Pulmonary exam normal        Cardiovascular Exercise Tolerance: Poor hypertension,  Rhythm:Regular Rate:Tachycardia  ECHO 2022: INTERPRETATION  NORMAL LEFT VENTRICULAR SYSTOLIC FUNCTION   WITH MILD LVH  NORMAL RIGHT VENTRICULAR SYSTOLIC FUNCTION  TRIVIAL REGURGITATION NOTED (See above)  NO VALVULAR STENOSIS  IRREGULAR HEART RHYTHM CAPTURED THROUGHOUT EXAM  LVEF WITHIN LOWER LIMITS OF NORMAL 50-55%  Aortic: NORMAL GRADIENTS  Mitral: TRIVIAL MR  Tricuspid: TRIVIAL TR  Pulmonic: NORMAL GRADIENTS  *PATIENT UNABLE TO LIE LEFT LATERAL DECUBITUS; EXAM DONE WHILE PATIENT SUPIN     Neuro/Psych negative neurological ROS  negative psych ROS   GI/Hepatic negative GI ROS, Neg liver ROS,,,  Endo/Other  diabetes, Poorly Controlled, Type 2Hypothyroidism  Class 3 obesity  Renal/GU      Musculoskeletal   Abdominal  (+) + obese  Peds  Hematology negative hematology ROS (+)   Anesthesia Other Findings Prader-Willi syndrome with intellectual disability  BMI    Body Mass Index: 51.21 kg/m      Reproductive/Obstetrics negative OB ROS                             Anesthesia Physical Anesthesia Plan  ASA: 3  Anesthesia Plan: General ETT   Post-op Pain Management: Ofirmev IV (intra-op)* and Toradol IV (intra-op)*   Induction: Intravenous  PONV Risk Score and Plan: 2 and Ondansetron, Dexamethasone and Midazolam  Airway Management Planned: Oral ETT  Additional Equipment:   Intra-op Plan:   Post-operative Plan: Extubation in OR  Informed Consent: I have reviewed the  patients History and Physical, chart, labs and discussed the procedure including the risks, benefits and alternatives for the proposed anesthesia with the patient or authorized representative who has indicated his/her understanding and acceptance.     Dental Advisory Given and Consent reviewed with POA  Plan Discussed with: CRNA and Surgeon  Anesthesia Plan Comments:        Anesthesia Quick Evaluation

## 2023-08-26 NOTE — ED Triage Notes (Signed)
 Pt arrived via [facility] from home, where she lives with family, post ground-level fall this AM. Right knee with outward rotation of patella. EMS reports no LOC or head impact on fall. Pt received IV Fentanyl in route to ED.     **Hx/o of Prader-Willi syndrome, HTN, DM.

## 2023-08-26 NOTE — Discharge Instructions (Signed)
Call orthopedic Dr Audelia Acton for an appointment.   Keep the leg in the knee immobilizer until this appointment  Take acetaminophen 650 mg and ibuprofen 400 mg every 6 hours for pain.  Take with food.   Thank you for choosing [place] for your health care today!  Please see your primary doctor this week for a follow up appointment.   If you have any new, worsening, or unexpected symptoms call your doctor right away or come back to the emergency department for reevaluation.  It was my pleasure to care for you today.   Daneil Dan Modesto Charon, MD  --  Llame al Dr. Audelia Acton, ortopedista, para programar una cita.   Mantenga la pierna en el inmovilizador de rodilla hasta la cita.  Tome acetaminofeno 650 mg e ibuprofeno 400 mg cada 6 horas para Chief Technology Officer. Tmelos con alimentos.  Gracias por elegirnos para su atencin mdica hoy!  Visite a su mdico de cabecera esta semana para programar una cita de seguimiento.   Si tiene sntomas nuevos, que empeoran o inesperados, llame a su mdico de inmediato o regrese al departamento de emergencias para una reevaluacin.  Fue un Architectural technologist.   Daneil Dan Modesto Charon, MD

## 2023-08-26 NOTE — H&P (Addendum)
 ORTHOPAEDIC HISTORY AND PHYSICAL  REQUESTING PHYSICIAN: Pilar Jarvis, MD  Chief Complaint:   R Knee pain  History of Present Illness: Linda Mason is a 28 y.o. female w/Past medical history of Prader-Willi syndrome, intellectual delay and diabetes who presents to the Emergency Department with her family after she experienced a fall after getting out of bed last night.  She ambulates unassisted.  She lives at home with her mother and father.  She denies any antecedent knee pain or prior knee injuries.  She does take insulin for diabetes.  Past Medical History:  Diagnosis Date   Diabetes mellitus without complication (HCC)    History reviewed. No pertinent surgical history. Social History   Socioeconomic History   Marital status: Single    Spouse name: Not on file   Number of children: Not on file   Years of education: Not on file   Highest education level: Not on file  Occupational History   Not on file  Tobacco Use   Smoking status: Never   Smokeless tobacco: Not on file  Substance and Sexual Activity   Alcohol use: No   Drug use: Not on file   Sexual activity: Not on file  Other Topics Concern   Not on file  Social History Narrative   Not on file   Social Drivers of Health   Financial Resource Strain: Unknown (04/29/2017)   Received from [facility]   Overall Financial Resource Strain (CARDIA)    Difficulty of Paying Living Expenses: Patient declined  Food Insecurity: Unknown (04/29/2017)   Received from [facility]   Hunger Vital Sign    Worried About Running Out of Food in the Last Year: Patient declined    Ran Out of Food in the Last Year: Patient declined  Transportation Needs: Unknown (04/29/2017)   Received from [facility]   PRAPARE - Transportation    Lack of Transportation (Medical): Patient declined    Lack of Transportation  (Non-Medical): Patient declined  Physical Activity: Unknown (04/29/2017)   Received from [facility]   Exercise Vital Sign    Days of Exercise per Week: Patient declined    Minutes of Exercise per Session: Patient declined  Stress: Unknown (04/29/2017)   Received from [facility] - Occupational Stress Questionnaire    Feeling of Stress : Patient declined  Social Connections: Unknown (04/29/2017)   Received from [facility]   Social Connection and Isolation Panel [NHANES]    Frequency of Communication with Friends and Family: Patient declined    Frequency of Social Gatherings with Friends and Family: Patient declined    Attends Religious Services: Patient declined    Database administrator or Organizations: Patient declined    Attends Banker Meetings: Patient declined    Marital Status: Patient declined   History reviewed. No pertinent family history. No Known Allergies Prior to Admission medications   Medication Sig Start Date End Date Taking? Authorizing Provider  empagliflozin (JARDIANCE) 25 MG TABS tablet Take 25 mg by mouth daily.    [provider]  gabapentin (NEURONTIN) 100 MG capsule Take 1 capsule (100 mg total) by mouth 3 (three) times daily. 05/29/19 10/09/21  Emily Filbert, MD  insulin aspart (NOVOLOG) 100 UNIT/ML injection Inject 4 Units into the skin 3 (three) times daily with meals.    [provider]  insulin glargine (LANTUS) 100 UNIT/ML injection  Inject 50 Units into the skin at bedtime.    [provider]  levothyroxine (SYNTHROID) 50 MCG tablet Take 50 mcg by mouth daily before breakfast.    [provider]  lidocaine-prilocaine (EMLA) cream Apply 1 application. topically as needed. To affected area for pain 10/12/21   Hollice Espy, MD  lisinopril (ZESTRIL) 40 MG tablet Take 40 mg by mouth daily.    [provider]  metFORMIN (GLUCOPHAGE) 500 MG tablet Take 1,000 mg by mouth 2 (two) times daily with a meal.    [provider]  metoprolol succinate (TOPROL-XL) 25 MG 24 hr tablet Take 25 mg by mouth daily.    [provider]  omeprazole (PRILOSEC) 40 MG capsule Take 40 mg by mouth daily.    [provider]  sulfamethoxazole-trimethoprim (BACTRIM DS) 800-160 MG tablet Take 1 tablet by mouth 2 (two) times daily. 10/12/21   Hollice Espy, MD   No results for input(s): "WBC", "HGB", "HCT", "PLT", "K", "CL", "CO2", "BUN", "CREATININE", "GLUCOSE", "CALCIUM", "LABPT", "INR" in the last 72 hours. DG Knee Right Port Result Date: 08/26/2023 CLINICAL DATA:  Right knee pain. EXAM: PORTABLE RIGHT KNEE - 1-2 VIEW COMPARISON:  Radiographs and CT scan of same day. FINDINGS: Single AP image of right knee demonstrates continued lateral dislocation of patella. IMPRESSION: Continued lateral dislocation of patella. Electronically Signed   By: Lupita Raider M.D.   On: 08/26/2023 14:52   CT Knee Right Wo Contrast Result Date: 08/26/2023 CLINICAL DATA:  Knee trauma.  Patellar dislocation. EXAM: CT OF THE RIGHT KNEE WITHOUT CONTRAST TECHNIQUE: Multidetector CT imaging of the right knee was performed according to the standard protocol. Multiplanar CT image reconstructions were also generated. RADIATION DOSE REDUCTION: This exam was performed according to the departmental dose-optimization program which includes automated exposure control, adjustment of the mA and/or kV according to patient size and/or use of iterative reconstruction technique. COMPARISON:  X-rays from earlier same day FINDINGS: Bones/Joint/Cartilage Lateral patellar dislocation evident with small to moderate joint effusion. Tiny cortical fragments medial to the dislocated patella are likely related to patellar avulsion injury and correspond to the curvilinear density seen on previous x-ray. There is an associated fracture  involving the nonweightbearing cortex of the anterolateral right femoral condyle. No evidence for an acute fracture involving the tibial plateau or proximal fibula. There is some loss of joint space in the medial compartment. Ligaments Suboptimally assessed by CT. Muscles and Tendons The patellar tendon appears intact but a portion may be avulsed from the inferomedial patella. Quadriceps tendon appears intact but partial avulsion of the insertion cannot be excluded. Soft tissues Edema/hemorrhage is noted in the soft tissues about the anterior knee. IMPRESSION: 1. Lateral patellar dislocation with small to moderate joint effusion. 2. Tiny cortical fragments medial to the dislocated patella are likely related to patellar avulsion injury at the patellar tendon insertion and correspond to the curvilinear density seen on previous x-ray. 3. Associated fracture involving the nonweightbearing cortex of the anterolateral right femoral condyle. 4. The patellar tendon appears intact but a portion may be avulsed from the medial patella. Avulsion of the medial quadriceps tendon insertion not excluded. Electronically Signed   By: Kennith Center M.D.   On: 08/26/2023 07:39   DG Hip Unilat W or Wo Pelvis 2-3 Views Right Result Date: 08/26/2023 CLINICAL DATA:  Fall.  Hip pain. EXAM: DG HIP (WITH OR WITHOUT PELVIS) 2-3V RIGHT COMPARISON:  None Available. FINDINGS: There is no evidence of hip fracture  or dislocation. There is no evidence of arthropathy or other focal bone abnormality. IMPRESSION: Negative. Electronically Signed   By: Kennith Center M.D.   On: 08/26/2023 05:52   DG Knee 2 Views Right Result Date: 08/26/2023 CLINICAL DATA:  Pain.  Possible patellar dislocation. EXAM: RIGHT KNEE - 1-2 VIEW COMPARISON:  None Available. FINDINGS: Study is limited by positioning and bony demineralization. Patella appears laterally dislocated and there is probably an associated displaced fracture involving the inferior pole of the  patella. Oblique lateral projections shows a curvilinear focus of mineralization anterior to the patella, indeterminate but potentially related to cortical fracture fragment. Joint effusion suspected. IMPRESSION: Patella appears laterally dislocated and there is probably an associated displaced fracture involving the inferior pole of the patella. Curvilinear focus of mineralization anterior to the patella is indeterminate. CT imaging of the knee could be used for definitive characterization as clinically warranted. Electronically Signed   By: Kennith Center M.D.   On: 08/26/2023 05:51     Positive ROS: All other systems have been reviewed and were otherwise negative with the exception of those mentioned in the HPI and as above.  Physical Exam: BP (!) 138/122   Pulse (!) 111   Temp 97.9 F (36.6 C) (Oral)   Resp 13   Ht 5\' 2"  (1.575 m)   Wt 127 kg   SpO2 99%   BMI 51.21 kg/m  General:  Alert, no acute distress Psychiatric:  Patient is competent for consent with normal mood and affect     Orthopedic Exam:  RLE: 5/5 DF/PF/EHL SILT grossly over foot Foot wwp Patella resting in laterally dislocated position; knee in extension  Imaging:  As above: Persistent lateral dislocation of the patella with ossific fragments off of the medial patella and fracture of the lateral femoral condyle.  Quadriceps tendon and patellar tendon appear grossly intact CT scan.  Assessment/Plan: 28 year old female with Prader-Willi syndrome/intellectual delay presenting with an irreducible patellar dislocation.   1.  I had discussion about the diagnosis as well as various treatment options with the patient and her family with the use of a certified Spanish interpreter.  She appears to have a first-time patella dislocation that appears to be irreducible.  This is likely due to the cortical fragments and/or trapped retinaculum.  We agreed to proceed with surgical intervention.  This would consist of right knee  arthroscopy, loose body removal, and open reduction of the patella with possible MPFL reconstruction. After discussion of risks, benefits, and alternatives to surgery, the patient and family elected to proceed.    2. NPO until OR.     Signa Kell   08/26/2023 4:19 PM

## 2023-08-26 NOTE — Op Note (Signed)
   Surgery not performed due to tachycardia, O2 sats dropping into 80s on RA, and temperature of 101.4 prior to induction of anesthesia.    Recommendation by Anesthesia team for further workup of these findings with goal of returning to OR tomorrow, 08/27/23.    I have discussed these findings with Hospitalist team who has initiated workup.

## 2023-08-26 NOTE — Transfer of Care (Signed)
 Immediate Anesthesia Transfer of Care Note  Patient: Linda Mason [facility]  Procedure(s) Performed: ARTHROSCOPY KNEE MPFL RECONSTRUCTION (Knee)  Patient Location: PACU  Anesthesia Type: Case canceled due to hypoxia.  Level of Consciousness: awake and alert   Airway & Oxygen Therapy: Patient Spontanous Breathing and Patient connected to nasal cannula oxygen  Post-op Assessment: Report given to RN, Post -op Vital signs reviewed and stable, and Patient moving all extremities  Post vital signs: Reviewed and stable supplemental oxygen and tachycardia noted as baseline but concerning. afebrile  Last Vitals:  Vitals Value Taken Time  BP    Temp    Pulse    Resp    SpO2      Last Pain:  Vitals:   08/26/23 1626  TempSrc: Temporal  PainSc: 5          Complications: No notable events documented.

## 2023-08-26 NOTE — ED Provider Notes (Signed)
 CT scan shows continued dislocation. I did try reducing myself after further pain medication without any signs of relocation. Discussed with orthopedics. They did ask we try reduction under sedation. Discussed sedation risks with patient and family using interpreter.   .Sedation  Date/Time: 08/26/2023 3:23 PM  Performed by: Phineas Semen, MD Authorized by: Phineas Semen, MD   Consent:    Consent obtained:  Written   Consent given by:  Parent and patient   Risks discussed:  Allergic reaction, respiratory compromise necessitating ventilatory assistance and intubation, vomiting and nausea   Alternatives discussed:  Analgesia without sedation Universal protocol:    Immediately prior to procedure, a time out was called: yes   Pre-sedation assessment:    Time since last food or drink:  >6 hours   ASA classification: class 2 - patient with mild systemic disease     Mallampati score:  III - soft palate, base of uvula visible   Pre-sedation assessments completed and reviewed: airway patency, cardiovascular function, hydration status, mental status, nausea/vomiting, pain level, respiratory function and temperature   A pre-sedation assessment was completed prior to the start of the procedure Procedure details (see MAR for exact dosages):    Sedation:  Ketamine   Intended level of sedation: deep   Total Provider sedation time (minutes):  20 Post-procedure details:   A post-sedation assessment was completed following the completion of the procedure.  Reduction of dislocation  Consent: Verbal and written consent obtained. Risks and benefits: risks, benefits and alternatives were discussed Consent given by: patient Required items: required blood products, implants, devices, and special equipment available Time out: Immediately prior to procedure a "time out" was called to verify the correct patient, procedure, equipment, support staff and site/side marked as required.  Patient sedated:  ketamine  Vitals: Vital signs were monitored during sedation. Patient tolerance: Patient tolerated the procedure well with no immediate complications. Joint: right patella Reduction technique: direct pressure Reduction result: No significant movement appreciated. X-ray shows continued dislocation.     Patient was sedated and reduction was attempted.  Unfortunately this was not successful.  Discussed with Dr. Allena Katz with orthopedic surgery who plan on taking the patient to the OR for reduction.   Phineas Semen, MD 08/26/23 [phone]

## 2023-08-26 NOTE — ED Notes (Signed)
 Placed pt on a bedpan, pt unable to urinate even though she feels the urge to urinate. Advised can try a again later, if pt has a moment of incontinent, gave pt reassurance it was okay, will be able to get pt clean up, Difficulty maneuvering pt d/t deformity to right knee, using bedpan increase discomfort. Family is aware and verbalized understanding.

## 2023-08-26 NOTE — Anesthesia Postprocedure Evaluation (Signed)
 Anesthesia Post Note  Patient: [name] [facility]  Procedure(s) Performed: ARTHROSCOPY KNEE MPFL RECONSTRUCTION (Knee)  Patient location during evaluation: PACU Anesthesia Type: General Level of consciousness: awake and alert Pain management: pain level controlled Vital Signs Assessment: post-procedure vital signs reviewed and stable Respiratory status: spontaneous breathing, nonlabored ventilation, respiratory function stable and patient connected to nasal cannula oxygen Cardiovascular status: blood pressure returned to baseline, tachycardic and stable Postop Assessment: no apparent nausea or vomiting Anesthetic complications: no Comments: Discharge for hospitalist to evaluate. Vitals signs are the same as preop.  Tachycardia with need of supplemental oxygen. The pacu nurses found a discrepancy with her temperature as she is afebrile now.     No notable events documented.   Last Vitals:  Vitals:   08/26/23 1530 08/26/23 1626  BP: (!) 138/122 129/80  Pulse: (!) 111 (!) 118  Resp: 13   Temp:  (!) 38.4 C  SpO2:  90%    Last Pain:  Vitals:   08/26/23 1626  TempSrc: Temporal  PainSc: 5                  Linda Mason

## 2023-08-26 NOTE — Consult Note (Signed)
 Initial Consultation Note   Patient: Linda Mason [facility] ZOX:[id number] DOB: Jan 12, 1996 PCP: [facility] DOA: 08/26/2023 DOS: the patient was seen and examined on 08/26/2023 Primary service: No att. providers found  Referring physician: Dr. Signa Kell Reason for consult: DM control  Assessment/Plan:  IDDM with insulin resistance -Most recent A1c 11.1 in December 2024 -Continue Lantus 50 units daily -Increase meal coverage from 4 unit 3 times daily AC to 10 units 3 times daily AC -SSI for now -Continue metformin and Jardiance  Fever -Likely secondary to fracture of femur and knee dislocation -Monitor off antibiotics  Morbid obesity Question of sleep apnea -Outpatient PCP follow-up  DM neuropathy -Stable    [facility] will continue to follow the patient.  HPI: Linda Mason is a 28 y.o. female with past medical history of Prader-Willi, syndrome, IDDM with insulin resistance, thyroidism, morbid obesity presented to ED with mechanical fall and acute right knee pain.  Medical team is consulted for diabetes control.  Noticed that the patient has been having poorly controlled diabetes with A1c consistently above 10 for the last 2 checks in 6 months.  At baseline patient uses Lantus 15 units daily and NovoLog 3 units 3 times daily AC plus metformin and Jardiance.  Review of Systems: As mentioned in the history of present illness. All other systems reviewed and are negative. Past Medical History:  Diagnosis Date   Diabetes mellitus without complication (HCC)    History reviewed. No pertinent surgical history. Social History:  reports that she has never smoked. She does not have any smokeless tobacco history on file. She reports that she does not drink alcohol. No history on file for drug use.  No Known Allergies  History reviewed. No pertinent family history.  Prior to Admission medications   Medication Sig Start Date End Date Taking? Authorizing  Provider  empagliflozin (JARDIANCE) 25 MG TABS tablet Take 25 mg by mouth daily.   Yes [provider]  gabapentin (NEURONTIN) 100 MG capsule Take 1 capsule (100 mg total) by mouth 3 (three) times daily. 05/29/19 08/26/23 Yes Emily Filbert, MD  insulin aspart (NOVOLOG) 100 UNIT/ML injection Inject 4 Units into the skin 3 (three) times daily with meals.   Yes [provider]  insulin glargine (LANTUS) 100 UNIT/ML injection Inject 50 Units into the skin at bedtime.   Yes [provider]  levothyroxine (SYNTHROID) 50 MCG tablet Take 50 mcg by mouth daily before breakfast.   Yes [provider]  lisinopril (ZESTRIL) 40 MG tablet Take 40 mg by mouth daily.   Yes [provider]  metFORMIN (GLUCOPHAGE) 500 MG tablet Take 1,000 mg by mouth 2 (two) times daily with a meal.   Yes [provider]  metoprolol succinate (TOPROL-XL) 25 MG 24 hr tablet Take 25 mg by mouth daily.   Yes [provider]  omeprazole (PRILOSEC) 40 MG capsule Take 40 mg by mouth daily.   Yes [provider]  sulfamethoxazole-trimethoprim (BACTRIM DS) 800-160 MG tablet Take 1 tablet by mouth 2 (two) times daily. 10/12/21  Yes Hollice Espy, MD  lidocaine-prilocaine (EMLA) cream Apply 1 application. topically as needed. To affected area for pain 10/12/21   Hollice Espy, MD    Physical Exam: Vitals:   08/26/23 1415 08/26/23 1418 08/26/23 1530 08/26/23 1626  BP: (!) 151/90 (!) 188/95 (!) 138/122 129/80  Pulse: (!) 122 (!) 132 (!) 111 (!) 118  Resp: (!) 28 16 13    Temp:    Marland Kitchen)  101.2 F (38.4 C)  TempSrc:    Temporal  SpO2: (!) 87% 99%  90%  Weight:      Height:       Eyes: PERRL, lids and conjunctivae normal ENMT: Mucous membranes are moist. Posterior pharynx clear of any exudate or lesions.Normal dentition.  Neck: normal, supple, no masses, no thyromegaly Respiratory: clear to auscultation bilaterally, no wheezing, no crackles. Normal  respiratory effort. No accessory muscle use.  Cardiovascular: Regular rate and rhythm, no murmurs / rubs / gallops. No extremity edema. 2+ pedal pulses. No carotid bruits.  Abdomen: no tenderness, no masses palpated. No hepatosplenomegaly. Bowel sounds positive.  Musculoskeletal: Right knee swelling and pain and reduced ROM Skin: no rashes, lesions, ulcers. No induration Neurologic: CN 2-12 grossly intact. Sensation intact, DTR normal.  Muscle strength 5/5 on both sides Psychiatric: Normal judgment and insight. Alert and oriented x 3. Normal mood.    Data Reviewed:  Reviewed outpatient record of diabetes control including A1c and kidney function in the past 6 months in Care Everywhere Family Communication: Mother at bedside Primary team communication: Orthopedic surgery Thank you very much for involving [place] in the care of your patient.  Author: Emeline General, MD 08/26/2023 4:55 PM  For on call review [email or web address].

## 2023-08-26 NOTE — Progress Notes (Signed)
 Patient awake/alert x4.  Medicated per Dr. Allena Katz with 15mg  toradol IV as ordered for c/o's right knee discomfort. Patient presently on 2 liters [place] with sats 96%,  Afebrile.

## 2023-08-27 ENCOUNTER — Observation Stay: Payer: MEDICAID

## 2023-08-27 ENCOUNTER — Inpatient Hospital Stay: Payer: MEDICAID

## 2023-08-27 ENCOUNTER — Inpatient Hospital Stay: Payer: MEDICAID | Admitting: Certified Registered"

## 2023-08-27 ENCOUNTER — Encounter
Admission: EM | Disposition: A | Discharge status: Rehabilitation Facility | Payer: Self-pay | Source: Home / Self Care | Attending: Internal Medicine

## 2023-08-27 DIAGNOSIS — E875 Hyperkalemia: Secondary | ICD-10-CM | POA: Diagnosis not present

## 2023-08-27 DIAGNOSIS — Z794 Long term (current) use of insulin: Secondary | ICD-10-CM | POA: Diagnosis not present

## 2023-08-27 DIAGNOSIS — R000 Tachycardia, unspecified: Secondary | ICD-10-CM | POA: Diagnosis present

## 2023-08-27 DIAGNOSIS — Z79899 Other long term (current) drug therapy: Secondary | ICD-10-CM | POA: Diagnosis not present

## 2023-08-27 DIAGNOSIS — E1140 Type 2 diabetes mellitus with diabetic neuropathy, unspecified: Secondary | ICD-10-CM | POA: Diagnosis present

## 2023-08-27 DIAGNOSIS — Z1152 Encounter for screening for COVID-19: Secondary | ICD-10-CM | POA: Diagnosis not present

## 2023-08-27 DIAGNOSIS — R0902 Hypoxemia: Secondary | ICD-10-CM | POA: Diagnosis present

## 2023-08-27 DIAGNOSIS — Z7984 Long term (current) use of oral hypoglycemic drugs: Secondary | ICD-10-CM | POA: Diagnosis not present

## 2023-08-27 DIAGNOSIS — D696 Thrombocytopenia, unspecified: Secondary | ICD-10-CM | POA: Diagnosis present

## 2023-08-27 DIAGNOSIS — S82001A Unspecified fracture of right patella, initial encounter for closed fracture: Secondary | ICD-10-CM | POA: Diagnosis present

## 2023-08-27 DIAGNOSIS — Z603 Acculturation difficulty: Secondary | ICD-10-CM | POA: Diagnosis present

## 2023-08-27 DIAGNOSIS — Q8711 Prader-Willi syndrome: Secondary | ICD-10-CM | POA: Diagnosis not present

## 2023-08-27 DIAGNOSIS — S83004D Unspecified dislocation of right patella, subsequent encounter: Secondary | ICD-10-CM

## 2023-08-27 DIAGNOSIS — S83014A Lateral dislocation of right patella, initial encounter: Secondary | ICD-10-CM | POA: Diagnosis present

## 2023-08-27 DIAGNOSIS — E039 Hypothyroidism, unspecified: Secondary | ICD-10-CM | POA: Diagnosis present

## 2023-08-27 DIAGNOSIS — R509 Fever, unspecified: Secondary | ICD-10-CM | POA: Diagnosis present

## 2023-08-27 DIAGNOSIS — Z79890 Hormone replacement therapy (postmenopausal): Secondary | ICD-10-CM | POA: Diagnosis not present

## 2023-08-27 DIAGNOSIS — E1165 Type 2 diabetes mellitus with hyperglycemia: Secondary | ICD-10-CM | POA: Diagnosis present

## 2023-08-27 DIAGNOSIS — W06XXXA Fall from bed, initial encounter: Secondary | ICD-10-CM | POA: Diagnosis present

## 2023-08-27 DIAGNOSIS — S83004A Unspecified dislocation of right patella, initial encounter: Secondary | ICD-10-CM | POA: Diagnosis not present

## 2023-08-27 DIAGNOSIS — E88819 Insulin resistance, unspecified: Secondary | ICD-10-CM | POA: Diagnosis present

## 2023-08-27 DIAGNOSIS — I10 Essential (primary) hypertension: Secondary | ICD-10-CM | POA: Diagnosis present

## 2023-08-27 DIAGNOSIS — S83006A Unspecified dislocation of unspecified patella, initial encounter: Secondary | ICD-10-CM | POA: Diagnosis present

## 2023-08-27 DIAGNOSIS — Z6841 Body Mass Index (BMI) 40.0 and over, adult: Secondary | ICD-10-CM | POA: Diagnosis not present

## 2023-08-27 HISTORY — PX: KNEE ARTHROSCOPY: SHX127

## 2023-08-27 LAB — BASIC METABOLIC PANEL
Anion gap: 9 (ref 5–15)
BUN: 24 mg/dL — ABNORMAL HIGH (ref 6–20)
CO2: 23 mmol/L (ref 22–32)
Calcium: 8.8 mg/dL — ABNORMAL LOW (ref 8.9–10.3)
Chloride: 107 mmol/L (ref 98–111)
Creatinine, Ser: 0.51 mg/dL (ref 0.44–1.00)
GFR, Estimated: >60 mL/min (ref >60)
Glucose, Bld: 168 mg/dL — ABNORMAL HIGH (ref 70–99)
Potassium: 4.5 mmol/L (ref 3.5–5.1)
Sodium: 139 mmol/L (ref 135–145)

## 2023-08-27 LAB — RESPIRATORY PANEL BY PCR

## 2023-08-27 LAB — SURGICAL PCR SCREEN
MRSA, PCR: NEGATIVE
Staphylococcus aureus: POSITIVE — AB

## 2023-08-27 LAB — GLUCOSE, CAPILLARY
Glucose-Capillary: 156 mg/dL — ABNORMAL HIGH (ref 70–99)
Glucose-Capillary: 154 mg/dL — ABNORMAL HIGH (ref 70–99)
Glucose-Capillary: 182 mg/dL — ABNORMAL HIGH (ref 70–99)
Glucose-Capillary: 213 mg/dL — ABNORMAL HIGH (ref 70–99)
Glucose-Capillary: 298 mg/dL — ABNORMAL HIGH (ref 70–99)

## 2023-08-27 LAB — HEMOGLOBIN A1C
Hgb A1c MFr Bld: 9.5 % — ABNORMAL HIGH (ref 4.8–5.6)
Mean Plasma Glucose: 225.95 mg/dL

## 2023-08-27 LAB — HIV ANTIBODY (ROUTINE TESTING W REFLEX): HIV Screen 4th Generation wRfx: NONREACTIVE

## 2023-08-27 LAB — D-DIMER, QUANTITATIVE: D-Dimer, Quant: 2.41 ug{FEU}/mL — ABNORMAL HIGH (ref 0.00–0.50)

## 2023-08-27 SURGERY — ARTHROSCOPY, KNEE
Anesthesia: General | Site: Knee | Laterality: Right

## 2023-08-27 MED ORDER — ACETAMINOPHEN 10 MG/ML IV SOLN
INTRAVENOUS | Status: DC | PRN
  Administered 2023-08-27: 1000 mg via INTRAVENOUS

## 2023-08-27 MED ORDER — PROPOFOL 10 MG/ML IV BOLUS
INTRAVENOUS | Status: DC | PRN
  Administered 2023-08-27: 150 mg via INTRAVENOUS

## 2023-08-27 MED ORDER — ONDANSETRON HCL 4 MG PO TABS: 4.0000 mg | ORAL_TABLET | Freq: Four times a day (QID) | ORAL | Status: DC | PRN

## 2023-08-27 MED ORDER — GLYCOPYRROLATE 0.2 MG/ML IJ SOLN
INTRAMUSCULAR | Status: AC
  Filled 2023-08-27: qty 2

## 2023-08-27 MED ORDER — OXYCODONE HCL 5 MG PO TABS
10.0000 mg | ORAL_TABLET | ORAL | Status: DC | PRN
  Administered 2023-08-27 – 2023-08-30 (×8): 10 mg via ORAL
  Administered 2023-08-30: 15 mg via ORAL
  Administered 2023-08-31 (×2): 10 mg via ORAL
  Filled 2023-08-27: qty 2
  Filled 2023-08-27: qty 3
  Filled 2023-08-27 (×7): qty 2

## 2023-08-27 MED ORDER — NEOSTIGMINE METHYLSULFATE 10 MG/10ML IV SOLN
INTRAVENOUS | Status: DC | PRN
  Administered 2023-08-27: 3 mg via INTRAVENOUS

## 2023-08-27 MED ORDER — CEFAZOLIN SODIUM 1 G IJ SOLR
INTRAMUSCULAR | Status: AC
  Filled 2023-08-27: qty 30

## 2023-08-27 MED ORDER — OXYCODONE HCL 5 MG PO TABS: 10.0000 mg | ORAL_TABLET | ORAL | Status: DC | PRN

## 2023-08-27 MED ORDER — OXYCODONE HCL 5 MG/5ML PO SOLN: 5.0000 mg | Freq: Once | ORAL | Status: DC | PRN

## 2023-08-27 MED ORDER — KETOROLAC TROMETHAMINE 15 MG/ML IJ SOLN
15.0000 mg | Freq: Four times a day (QID) | INTRAMUSCULAR | Status: DC
  Administered 2023-08-27 – 2023-08-28 (×3): 15 mg via INTRAVENOUS
  Filled 2023-08-27 (×3): qty 1

## 2023-08-27 MED ORDER — OXYCODONE HCL 5 MG PO TABS: 5.0000 mg | ORAL_TABLET | ORAL | Status: DC | PRN

## 2023-08-27 MED ORDER — ACETAMINOPHEN 10 MG/ML IV SOLN: 1000.0000 mg | Freq: Once | INTRAVENOUS | Status: DC

## 2023-08-27 MED ORDER — FENTANYL CITRATE (PF) 100 MCG/2ML IJ SOLN
INTRAMUSCULAR | Status: DC | PRN
  Administered 2023-08-27 (×2): 50 ug via INTRAVENOUS

## 2023-08-27 MED ORDER — INSULIN GLARGINE-YFGN 100 UNIT/ML ~~LOC~~ SOLN
25.0000 [IU] | Freq: Every day | SUBCUTANEOUS | Status: DC
  Administered 2023-08-27: 25 [IU] via SUBCUTANEOUS
  Filled 2023-08-27 (×2): qty 0.25

## 2023-08-27 MED ORDER — ASPIRIN 325 MG PO TBEC
325.0000 mg | DELAYED_RELEASE_TABLET | Freq: Every day | ORAL | Status: DC
  Administered 2023-08-27 – 2023-09-01 (×6): 325 mg via ORAL
  Filled 2023-08-27 (×6): qty 1

## 2023-08-27 MED ORDER — ONDANSETRON HCL 4 MG/2ML IJ SOLN: 4.0000 mg | Freq: Four times a day (QID) | INTRAMUSCULAR | Status: DC | PRN

## 2023-08-27 MED ORDER — ENSURE MAX PROTEIN PO LIQD
11.0000 [oz_av] | Freq: Two times a day (BID) | ORAL | Status: DC
  Administered 2023-08-28 – 2023-09-01 (×6): 11 [oz_av] via ORAL
  Filled 2023-08-27: qty 330

## 2023-08-27 MED ORDER — DOCUSATE SODIUM 100 MG PO CAPS: 100.0000 mg | ORAL_CAPSULE | Freq: Two times a day (BID) | ORAL | Status: DC

## 2023-08-27 MED ORDER — DEXAMETHASONE SODIUM PHOSPHATE 10 MG/ML IJ SOLN
INTRAMUSCULAR | Status: AC
  Filled 2023-08-27: qty 1

## 2023-08-27 MED ORDER — METOPROLOL SUCCINATE ER 25 MG PO TB24
25.0000 mg | ORAL_TABLET | Freq: Every day | ORAL | Status: DC
  Administered 2023-08-27 – 2023-09-01 (×6): 25 mg via ORAL
  Filled 2023-08-27 (×6): qty 1

## 2023-08-27 MED ORDER — MIDAZOLAM HCL 2 MG/2ML IJ SOLN
INTRAMUSCULAR | Status: DC | PRN
  Administered 2023-08-27: 2 mg via INTRAVENOUS

## 2023-08-27 MED ORDER — LIDOCAINE HCL (PF) 2 % IJ SOLN
INTRAMUSCULAR | Status: AC
  Filled 2023-08-27: qty 5

## 2023-08-27 MED ORDER — INSULIN ASPART 100 UNIT/ML IJ SOLN
4.0000 [IU] | Freq: Three times a day (TID) | INTRAMUSCULAR | Status: DC
  Administered 2023-08-28 – 2023-08-31 (×11): 4 [IU] via SUBCUTANEOUS
  Filled 2023-08-27 (×11): qty 1

## 2023-08-27 MED ORDER — INSULIN ASPART 100 UNIT/ML IJ SOLN
0.0000 [IU] | INTRAMUSCULAR | Status: DC
  Administered 2023-08-27: 5 [IU] via SUBCUTANEOUS
  Administered 2023-08-27 – 2023-08-28 (×2): 3 [IU] via SUBCUTANEOUS
  Administered 2023-08-28: 2 [IU] via SUBCUTANEOUS
  Administered 2023-08-28: 7 [IU] via SUBCUTANEOUS
  Administered 2023-08-28 (×3): 5 [IU] via SUBCUTANEOUS
  Administered 2023-08-29: 3 [IU] via SUBCUTANEOUS
  Administered 2023-08-29: 7 [IU] via SUBCUTANEOUS
  Administered 2023-08-29: 3 [IU] via SUBCUTANEOUS
  Administered 2023-08-29: 5 [IU] via SUBCUTANEOUS
  Administered 2023-08-29: 3 [IU] via SUBCUTANEOUS
  Administered 2023-08-29 – 2023-08-30 (×2): 9 [IU] via SUBCUTANEOUS
  Administered 2023-08-30: 5 [IU] via SUBCUTANEOUS
  Administered 2023-08-30 (×2): 7 [IU] via SUBCUTANEOUS
  Administered 2023-08-30: 3 [IU] via SUBCUTANEOUS
  Administered 2023-08-30: 9 [IU] via SUBCUTANEOUS
  Administered 2023-08-31: 3 [IU] via SUBCUTANEOUS
  Administered 2023-08-31: 9 [IU] via SUBCUTANEOUS
  Administered 2023-08-31 (×2): 5 [IU] via SUBCUTANEOUS
  Filled 2023-08-27 (×24): qty 1

## 2023-08-27 MED ORDER — LIDOCAINE HCL (CARDIAC) PF 100 MG/5ML IV SOSY
PREFILLED_SYRINGE | INTRAVENOUS | Status: DC | PRN
  Administered 2023-08-27: 60 mg via INTRAVENOUS

## 2023-08-27 MED ORDER — LIDOCAINE-EPINEPHRINE (PF) 1 %-1:200000 IJ SOLN
INTRAMUSCULAR | Status: DC | PRN
  Administered 2023-08-27: 20 mL via INTRAMUSCULAR

## 2023-08-27 MED ORDER — SENNOSIDES-DOCUSATE SODIUM 8.6-50 MG PO TABS: 1.0000 | ORAL_TABLET | Freq: Every evening | ORAL | Status: DC | PRN

## 2023-08-27 MED ORDER — EPINEPHRINE PF 1 MG/ML IJ SOLN
INTRAMUSCULAR | Status: AC
  Filled 2023-08-27: qty 1

## 2023-08-27 MED ORDER — GABAPENTIN 100 MG PO CAPS
100.0000 mg | ORAL_CAPSULE | Freq: Three times a day (TID) | ORAL | Status: DC
  Administered 2023-08-27 – 2023-09-01 (×15): 100 mg via ORAL
  Filled 2023-08-27 (×15): qty 1

## 2023-08-27 MED ORDER — DOCUSATE SODIUM 100 MG PO CAPS
100.0000 mg | ORAL_CAPSULE | Freq: Two times a day (BID) | ORAL | Status: DC
  Administered 2023-08-27 – 2023-09-01 (×9): 100 mg via ORAL
  Filled 2023-08-27 (×10): qty 1

## 2023-08-27 MED ORDER — MIDAZOLAM HCL 2 MG/2ML IJ SOLN
INTRAMUSCULAR | Status: AC
  Filled 2023-08-27: qty 2

## 2023-08-27 MED ORDER — INSULIN GLARGINE 100 UNIT/ML ~~LOC~~ SOLN: 50.0000 [IU] | Freq: Every day | SUBCUTANEOUS | Status: DC

## 2023-08-27 MED ORDER — ONDANSETRON HCL 4 MG/2ML IJ SOLN
INTRAMUSCULAR | Status: AC
  Filled 2023-08-27: qty 2

## 2023-08-27 MED ORDER — ADULT MULTIVITAMIN W/MINERALS CH
1.0000 | ORAL_TABLET | Freq: Every day | ORAL | Status: DC
  Administered 2023-08-28 – 2023-09-01 (×5): 1 via ORAL
  Filled 2023-08-27 (×5): qty 1

## 2023-08-27 MED ORDER — LACTATED RINGERS IR SOLN
Status: DC | PRN
  Administered 2023-08-27: 3000 mL

## 2023-08-27 MED ORDER — HYDROMORPHONE HCL 1 MG/ML IJ SOLN
0.2000 mg | INTRAMUSCULAR | Status: DC | PRN
  Filled 2023-08-27: qty 0.5

## 2023-08-27 MED ORDER — IOHEXOL 350 MG/ML SOLN
75.0000 mL | Freq: Once | INTRAVENOUS | Status: AC | PRN
  Administered 2023-08-27: 75 mL via INTRAVENOUS

## 2023-08-27 MED ORDER — FENTANYL CITRATE (PF) 100 MCG/2ML IJ SOLN
INTRAMUSCULAR | Status: AC
  Filled 2023-08-27: qty 2

## 2023-08-27 MED ORDER — OXYCODONE HCL 5 MG PO TABS: 5.0000 mg | ORAL_TABLET | Freq: Once | ORAL | Status: DC | PRN

## 2023-08-27 MED ORDER — INSULIN ASPART 100 UNIT/ML IJ SOLN: 0.0000 [IU] | Freq: Three times a day (TID) | INTRAMUSCULAR | Status: DC

## 2023-08-27 MED ORDER — BUPIVACAINE HCL (PF) 0.5 % IJ SOLN
INTRAMUSCULAR | Status: AC
  Filled 2023-08-27: qty 30

## 2023-08-27 MED ORDER — ACETAMINOPHEN 500 MG PO TABS: 1000.0000 mg | ORAL_TABLET | Freq: Three times a day (TID) | ORAL | Status: DC

## 2023-08-27 MED ORDER — LEVOTHYROXINE SODIUM 50 MCG PO TABS
50.0000 ug | ORAL_TABLET | Freq: Every day | ORAL | Status: DC
Start: 2023-08-27 — End: 2023-09-01
  Administered 2023-08-28 – 2023-09-01 (×5): 50 ug via ORAL
  Filled 2023-08-27 (×5): qty 1

## 2023-08-27 MED ORDER — SODIUM CHLORIDE 0.9 % IV SOLN
2.0000 g | INTRAVENOUS | Status: DC
  Administered 2023-08-27: 2 g via INTRAVENOUS
  Filled 2023-08-27 (×2): qty 20

## 2023-08-27 MED ORDER — PROPOFOL 10 MG/ML IV BOLUS
INTRAVENOUS | Status: AC
  Filled 2023-08-27: qty 20

## 2023-08-27 MED ORDER — ACETAMINOPHEN 10 MG/ML IV SOLN
INTRAVENOUS | Status: AC
  Filled 2023-08-27: qty 100

## 2023-08-27 MED ORDER — LISINOPRIL 20 MG PO TABS
40.0000 mg | ORAL_TABLET | Freq: Every day | ORAL | Status: DC
  Administered 2023-08-28 – 2023-09-01 (×5): 40 mg via ORAL
  Filled 2023-08-27 (×5): qty 2

## 2023-08-27 MED ORDER — INSULIN ASPART 100 UNIT/ML IJ SOLN: 10.0000 [IU] | Freq: Three times a day (TID) | INTRAMUSCULAR | Status: DC

## 2023-08-27 MED ORDER — INSULIN ASPART 100 UNIT/ML IJ SOLN: 0.0000 [IU] | Freq: Every day | INTRAMUSCULAR | Status: DC

## 2023-08-27 MED ORDER — SODIUM CHLORIDE 0.9 % IV SOLN: INTRAVENOUS | Status: DC | PRN

## 2023-08-27 MED ORDER — DEXTROSE 5 % IV SOLN
INTRAVENOUS | Status: DC | PRN
  Administered 2023-08-27: 3 g via INTRAVENOUS

## 2023-08-27 MED ORDER — ROCURONIUM BROMIDE 100 MG/10ML IV SOLN
INTRAVENOUS | Status: DC | PRN
Start: 2023-08-27 — End: 2023-08-27
  Administered 2023-08-27: 10 mg via INTRAVENOUS
  Administered 2023-08-27: 50 mg via INTRAVENOUS

## 2023-08-27 MED ORDER — ONDANSETRON HCL 4 MG/2ML IJ SOLN
INTRAMUSCULAR | Status: DC | PRN
  Administered 2023-08-27: 4 mg via INTRAVENOUS

## 2023-08-27 MED ORDER — DEXAMETHASONE SODIUM PHOSPHATE 10 MG/ML IJ SOLN
INTRAMUSCULAR | Status: DC | PRN
  Administered 2023-08-27: 5 mg via INTRAVENOUS

## 2023-08-27 MED ORDER — DIPHENHYDRAMINE HCL 12.5 MG/5ML PO ELIX: 12.5000 mg | ORAL_SOLUTION | ORAL | Status: DC | PRN

## 2023-08-27 MED ORDER — ACETAMINOPHEN 500 MG PO TABS
1000.0000 mg | ORAL_TABLET | Freq: Three times a day (TID) | ORAL | Status: DC
  Administered 2023-08-27 – 2023-09-01 (×15): 1000 mg via ORAL
  Filled 2023-08-27 (×15): qty 2

## 2023-08-27 MED ORDER — LIDOCAINE-EPINEPHRINE (PF) 1 %-1:200000 IJ SOLN
INTRAMUSCULAR | Status: AC
  Filled 2023-08-27: qty 30

## 2023-08-27 MED ORDER — OXYCODONE HCL 5 MG PO TABS
5.0000 mg | ORAL_TABLET | ORAL | Status: DC | PRN
  Administered 2023-08-30 (×2): 5 mg via ORAL
  Filled 2023-08-27 (×2): qty 2
  Filled 2023-08-27: qty 1
  Filled 2023-08-27 (×3): qty 2

## 2023-08-27 MED ORDER — NAPROXEN 500 MG PO TABS
500.0000 mg | ORAL_TABLET | Freq: Two times a day (BID) | ORAL | Status: DC
  Administered 2023-08-28 – 2023-09-01 (×9): 500 mg via ORAL
  Filled 2023-08-27 (×11): qty 1

## 2023-08-27 MED ORDER — HYDROMORPHONE HCL 1 MG/ML IJ SOLN
0.2000 mg | INTRAMUSCULAR | Status: DC | PRN
Start: 2023-08-27 — End: 2023-08-27

## 2023-08-27 MED ORDER — HYDROMORPHONE HCL 1 MG/ML IJ SOLN: 0.5000 mg | INTRAMUSCULAR | Status: DC | PRN

## 2023-08-27 MED ORDER — PANTOPRAZOLE SODIUM 40 MG PO TBEC
80.0000 mg | DELAYED_RELEASE_TABLET | Freq: Every day | ORAL | Status: DC
  Administered 2023-08-28 – 2023-09-01 (×5): 80 mg via ORAL
  Filled 2023-08-27 (×5): qty 2

## 2023-08-27 MED ORDER — NEOSTIGMINE METHYLSULFATE 10 MG/10ML IV SOLN
INTRAVENOUS | Status: AC
  Filled 2023-08-27: qty 1

## 2023-08-27 MED ORDER — FENTANYL CITRATE (PF) 100 MCG/2ML IJ SOLN
25.0000 ug | INTRAMUSCULAR | Status: DC | PRN
  Administered 2023-08-27 (×2): 25 ug via INTRAVENOUS

## 2023-08-27 MED ORDER — GLYCOPYRROLATE 0.2 MG/ML IJ SOLN
INTRAMUSCULAR | Status: DC | PRN
  Administered 2023-08-27: .4 mg via INTRAVENOUS

## 2023-08-27 SURGICAL SUPPLY — 81 items
ADAPTER IRRIG TUBE 2 SPIKE SOL (ADAPTER) ×2 IMPLANT
BLADE FULL RADIUS 3.5 (BLADE) IMPLANT
BLADE SAW SGTL 13X75X1.27 (BLADE) ×1 IMPLANT
BLADE SHAVER 4.5X7 STR FR (MISCELLANEOUS) ×1 IMPLANT
BLADE SURG 15 STRL LF DISP TIS (BLADE) ×1 IMPLANT
BLADE SURG SZ10 CARB STEEL (BLADE) ×1 IMPLANT
BLADE SURG SZ11 CARB STEEL (BLADE) ×1 IMPLANT
BNDG ESMARCH 6X12 STRL LF (GAUZE/BANDAGES/DRESSINGS) ×1 IMPLANT
BRACE KNEE POST OP SHORT (BRACE) ×1 IMPLANT
BUR 4X45 EGG (BURR) ×1 IMPLANT
BUR 4X55 1 (BURR) ×1 IMPLANT
BUR BR 5.5 WIDE MOUTH (BURR) IMPLANT
CHLORAPREP W/TINT 26 (MISCELLANEOUS) ×1 IMPLANT
COOLER POLAR GLACIER W/PUMP (MISCELLANEOUS) ×1 IMPLANT
COVER MAYO STAND STRL (DRAPES) ×1 IMPLANT
CUFF TRNQT CYL 24X4X16.5-23 (TOURNIQUET CUFF) ×1 IMPLANT
CUFF TRNQT CYL 34X4.125X (TOURNIQUET CUFF) ×1 IMPLANT
DERMABOND ADVANCED .7 DNX12 (GAUZE/BANDAGES/DRESSINGS) IMPLANT
DRAPE ARTHROSCOPY W/POUCH 90 (DRAPES) ×1 IMPLANT
DRAPE C-ARM XRAY 36X54 (DRAPES) ×1 IMPLANT
DRAPE C-ARMOR (DRAPES) ×1 IMPLANT
DRAPE IMP U-DRAPE 54X76 (DRAPES) ×1 IMPLANT
DRAPE SHEET LG 3/4 BI-LAMINATE (DRAPES) ×1 IMPLANT
DRAPE SURG 17X11 SM STRL (DRAPES) ×1 IMPLANT
DRAPE TABLE BACK 80X90 (DRAPES) IMPLANT
ELECT REM PT RETURN 9FT ADLT (ELECTROSURGICAL) ×1 IMPLANT
ELECTRODE REM PT RTRN 9FT ADLT (ELECTROSURGICAL) ×1 IMPLANT
EVACUATOR DRAINAGE 10X20 100CC (DRAIN) IMPLANT
GAUZE SPONGE 4X4 12PLY STRL (GAUZE/BANDAGES/DRESSINGS) ×1 IMPLANT
GAUZE XEROFORM 1X8 LF (GAUZE/BANDAGES/DRESSINGS) IMPLANT
GLOVE BIO SURGEON STRL SZ7.5 (GLOVE) ×2 IMPLANT
GLOVE BIOGEL PI IND STRL 8 (GLOVE) ×2 IMPLANT
GLOVE SURG ORTHO 8.0 STRL STRW (GLOVE) ×2 IMPLANT
GLOVE SURG SYN 7.5 E (GLOVE) ×1 IMPLANT
GLOVE SURG SYN 7.5 PF PI (GLOVE) ×1 IMPLANT
GOWN STRL REUS W/ TWL LRG LVL3 (GOWN DISPOSABLE) ×1 IMPLANT
GOWN STRL REUS W/ TWL XL LVL3 (GOWN DISPOSABLE) ×1 IMPLANT
GRADUATE 1200CC STRL 31836 (MISCELLANEOUS) ×1 IMPLANT
HANDLE YANKAUER SUCT BULB TIP (MISCELLANEOUS) ×1 IMPLANT
IV LR IRRIG 3000ML ARTHROMATIC (IV SOLUTION) ×4 IMPLANT
KIT TURNOVER KIT A (KITS) ×1 IMPLANT
LABEL OR SOLS (LABEL) ×1 IMPLANT
MANIFOLD NEPTUNE II (INSTRUMENTS) ×2 IMPLANT
MAT ABSORB FLUID 56X50 GRAY (MISCELLANEOUS) ×1 IMPLANT
NDL FILTER BLUNT 18X1 1/2 (NEEDLE) ×1 IMPLANT
NDL HYPO 21X1.5 SAFETY (NEEDLE) ×1 IMPLANT
NDL HYPO 22X1.5 SAFETY MO (MISCELLANEOUS) ×1 IMPLANT
NDL MAYO 6 CRC TAPER PT (NEEDLE) IMPLANT
NDL SAFETY ECLIPSE 18X1.5 (NEEDLE) ×1 IMPLANT
NDL SUT 5 .5 CRC TPR PNT MAYO (NEEDLE) ×1 IMPLANT
NEEDLE FILTER BLUNT 18X1 1/2 (NEEDLE) ×1 IMPLANT
NEEDLE HYPO 21X1.5 SAFETY (NEEDLE) ×1 IMPLANT
NEEDLE HYPO 22X1.5 SAFETY MO (MISCELLANEOUS) ×1 IMPLANT
NEEDLE MAYO 6 CRC TAPER PT (NEEDLE) IMPLANT
NS IRRIG 1000ML POUR BTL (IV SOLUTION) ×1 IMPLANT
PACK ARTHROSCOPY KNEE (MISCELLANEOUS) ×1 IMPLANT
PAD ABD DERMACEA PRESS 5X9 (GAUZE/BANDAGES/DRESSINGS) ×2 IMPLANT
PAD CAST 4YDX4 CTTN HI CHSV (CAST SUPPLIES) ×1 IMPLANT
PAD WRAPON POLAR KNEE (MISCELLANEOUS) ×1 IMPLANT
PADDING CAST COTTON 6X4 NS (MISCELLANEOUS) ×1 IMPLANT
PENCIL SMOKE EVACUATOR (MISCELLANEOUS) ×1 IMPLANT
SLEEVE REMOTE CONTROL 5X12 (DRAPES) IMPLANT
SPONGE T-LAP 18X18 ~~LOC~~+RFID (SPONGE) ×2 IMPLANT
SUT BONE WAX W31G (SUTURE) ×1 IMPLANT
SUT ETHILON 3-0 FS-10 30 BLK (SUTURE) ×1 IMPLANT
SUT MNCRL AB 4-0 PS2 18 (SUTURE) ×1 IMPLANT
SUT VIC AB 0 CT1 36 (SUTURE) ×1 IMPLANT
SUT VIC AB 2-0 CT1 (SUTURE) ×1 IMPLANT
SUT VIC AB 2-0 CT2 27 (SUTURE) ×2 IMPLANT
SUTURE EHLN 3-0 FS-10 30 BLK (SUTURE) ×1 IMPLANT
SYR 10ML LL (SYRINGE) ×1 IMPLANT
SYR 30ML LL (SYRINGE) ×1 IMPLANT
SYR 50ML LL SCALE MARK (SYRINGE) ×1 IMPLANT
SYR BULB IRRIG 60ML STRL (SYRINGE) ×1 IMPLANT
TOWEL OR 17X26 4PK STRL BLUE (TOWEL DISPOSABLE) ×3 IMPLANT
TRAP FLUID SMOKE EVACUATOR (MISCELLANEOUS) ×1 IMPLANT
TUBE SET DOUBLEFLO INFLOW (TUBING) ×1 IMPLANT
TUBE SET DOUBLEFLO OUTFLOW (TUBING) ×1 IMPLANT
WAND WEREWOLF FLOW 90D (MISCELLANEOUS) ×1 IMPLANT
WATER STERILE IRR 500ML POUR (IV SOLUTION) ×1 IMPLANT
WRAPON POLAR PAD KNEE (MISCELLANEOUS) IMPLANT

## 2023-08-27 NOTE — Progress Notes (Signed)
 PROGRESS NOTE    Linda Mason [facility]  ZOX:[id number] DOB: Mar 23, 1996 DOA: 08/26/2023 PCP: [facility]    Brief Narrative:   28 y.o. female with past medical history of Prader-Willi, syndrome, IDDM with insulin resistance, thyroidism, morbid obesity presented to ED with mechanical fall and acute right knee pain.  Medical team is consulted for diabetes control.  Noticed that the patient has been having poorly controlled diabetes with A1c consistently above 10 for the last 2 checks in 6 months.  At baseline patient uses Lantus 15 units daily and NovoLog 3 units 3 times daily AC plus metformin and Jardiance.   Patient also having breakthrough fevers.  Unclear etiology.  No white count, mild elevation in procalcitonin.  Infectious workup unrevealing thus far.  No PE.  BL LE [place] completed and pending.  No pneumonia, no UTI.  Given the circumstance, patient is as medically optimized as possible and low to moderate risk for complications.  Recs discussed with orthopedic surgery Dr Allena Katz  Assessment & Plan:   Principal Problem:   Patellar dislocation Active Problems:   Diabetes mellitus with neuropathy (HCC)   Morbid obesity (HCC)   Type 2 diabetes mellitus without complication, with long-term current use of insulin (HCC)   Hypoxia   IDDM with insulin resistance, poor control -Most recent A1c 11.1 in December 2024 Plan: Basal bolus regimen Hold scheduled mealtime insulin while NPO DM coordinator consult Carb mod diet when advanced   Fever Unclear etiology.  Differential is broad.  No white count elevation, mild elevation in procalcitonin.  Workup unrevealing.  Neg for flu/covid/rsv.  No pneumonia on imaging, no PE, no UTI Plan: Empiric rocephin for now Check RVP Check BL LE [place] Monitor vitals and fever curve  Morbid obesity Question of sleep apnea BMI 51.2.  Complicating factor in overall care and prognosis     DVT prophylaxis: SQ lovenox Code Status:  FULL Family Communication:Mother at bedside 2/21 Disposition Plan: Status is: Inpatient Remains inpatient appropriate because: Knee dislocation.  Fever of unknown etiology   Level of care: Telemetry Medical  Consultants:  Orthpedics  Procedures:  None  Antimicrobials: Rocephin    Subjective: Seen and examined.  Endorses pain in right knee  Objective: Vitals:   08/26/23 1830 08/26/23 2346 08/27/23 0430 08/27/23 0800  BP:  129/79 135/78 127/79  Pulse: (!) 117 (!) 121 98 (!) 104  Resp: 16 19 20 20   Temp:  (!) 100.8 F (38.2 C) (!) 100.9 F (38.3 C) 99.3 F (37.4 C)  TempSrc:  Oral Oral Oral  SpO2: 95% 96% 98% 95%  Weight:      Height:        Intake/Output Summary (Last 24 hours) at 08/27/2023 1306 Last data filed at 08/27/2023 1140 Gross per 24 hour  Intake 220 ml  Output 1600 ml  Net -1380 ml   Filed Weights   08/26/23 0343  Weight: 127 kg    Examination:  General exam: Appears calm and comfortable  Respiratory system: Clear to auscultation. Respiratory effort normal. Cardiovascular system: S1S2, RRR, no murmur Gastrointestinal system: Obese, soft, NT/ND, normal BS Central nervous system: Alert and oriented. No focal neurological deficits. Extremities: Right knee swollen, tender to touch, decreased ROM Skin: No rashes, lesions or ulcers Psychiatry: Judgement and insight appear normal. Mood & affect appropriate.     Data Reviewed: I have personally reviewed following labs and imaging studies  CBC: Recent Labs  Lab 08/26/23 1936  WBC 9.6  HGB 13.9  HCT  42.8  MCV 95.1  PLT 54*   Basic Metabolic Panel: Recent Labs  Lab 08/26/23 1936 08/26/23 1942 08/27/23 0650  NA 135  --  139  K 4.5  --  4.5  CL 102  --  107  CO2 21*  --  23  GLUCOSE 153*  --  168*  BUN 17  --  24*  CREATININE 0.37*  --  0.51  CALCIUM 8.9  --  8.8*  MG  --  1.9  --    GFR: Estimated Creatinine Clearance: 133.7 mL/min (by C-G formula based on SCr of 0.51  mg/dL). Liver Function Tests: Recent Labs  Lab 08/26/23 1936  AST 57*  ALT 74*  ALKPHOS 123  BILITOT 2.4*  PROT 6.9  ALBUMIN 3.3*   No results for input(s): "LIPASE", "AMYLASE" in the last 168 hours. No results for input(s): "AMMONIA" in the last 168 hours. Coagulation Profile: No results for input(s): "INR", "PROTIME" in the last 168 hours. Cardiac Enzymes: No results for input(s): "CKTOTAL", "CKMB", "CKMBINDEX", "TROPONINI" in the last 168 hours. BNP (last 3 results) No results for input(s): "PROBNP" in the last 8760 hours. HbA1C: Recent Labs    08/26/23 1936  HGBA1C 9.5*   CBG: Recent Labs  Lab 08/26/23 1635 08/26/23 2210 08/27/23 0818  GLUCAP 146* 188* 156*   Lipid Profile: No results for input(s): "CHOL", "HDL", "LDLCALC", "TRIG", "CHOLHDL", "LDLDIRECT" in the last 72 hours. Thyroid Function Tests: No results for input(s): "TSH", "T4TOTAL", "FREET4", "T3FREE", "THYROIDAB" in the last 72 hours. Anemia Panel: No results for input(s): "VITAMINB12", "FOLATE", "FERRITIN", "TIBC", "IRON", "RETICCTPCT" in the last 72 hours. Sepsis Labs: Recent Labs  Lab 08/26/23 1936 08/26/23 1942 08/26/23 2151  PROCALCITON  --  0.38  --   LATICACIDVEN 1.4  --  1.3    Recent Results (from the past 240 hours)  Culture, blood (Routine X 2) w Reflex to ID Panel     Status: None (Preliminary result)   Collection Time: 08/26/23  7:38 PM   Specimen: BLOOD  Result Value Ref Range Status   Specimen Description BLOOD BLOOD LEFT ARM  Final   Special Requests   Final    BOTTLES DRAWN AEROBIC AND ANAEROBIC Blood Culture adequate volume   Culture   Final    NO GROWTH < 12 HOURS Performed at [facility], [address] [place], [place] [address]    Report Status PENDING  Incomplete  Culture, blood (Routine X 2) w Reflex to ID Panel     Status: None (Preliminary result)   Collection Time: 08/26/23  7:42 PM   Specimen: BLOOD  Result Value Ref Range Status   Specimen  Description BLOOD BLOOD LEFT HAND  Final   Special Requests   Final    BOTTLES DRAWN AEROBIC ONLY Blood Culture results may not be optimal due to an inadequate volume of blood received in culture bottles   Culture   Final    NO GROWTH < 12 HOURS Performed at [facility], [address], [place], [place] [address]    Report Status PENDING  Incomplete  Surgical PCR screen     Status: Abnormal   Collection Time: 08/26/23  8:08 PM   Specimen: Nasal Mucosa; Nasal Swab  Result Value Ref Range Status   MRSA, PCR NEGATIVE NEGATIVE Final   Staphylococcus aureus POSITIVE (A) NEGATIVE Final    Comment: (NOTE) The Xpert SA Assay (FDA approved for NASAL specimens in patients 28 years of age and older), is  one component of a comprehensive surveillance program. It is not intended to diagnose infection nor to guide or monitor treatment. Performed at [facility], [address] [place], [place] [address]   Resp panel by RT-PCR (RSV, Flu A&B, Covid) Anterior Nasal Swab     Status: None   Collection Time: 08/26/23  9:14 PM   Specimen: Anterior Nasal Swab  Result Value Ref Range Status   SARS Coronavirus 2 by RT PCR NEGATIVE NEGATIVE Final    Comment: (NOTE) SARS-CoV-2 target nucleic acids are NOT DETECTED.  The SARS-CoV-2 RNA is generally detectable in upper respiratory specimens during the acute phase of infection. The lowest concentration of SARS-CoV-2 viral copies this assay can detect is 138 copies/mL. A negative result does not preclude SARS-Cov-2 infection and should not be used as the sole basis for treatment or other patient management decisions. A negative result may occur with  improper specimen collection/handling, submission of specimen other than nasopharyngeal swab, presence of viral mutation(s) within the areas targeted by this assay, and inadequate number of viral copies(<138 copies/mL). A negative result must be combined with clinical observations, patient  history, and epidemiological information. The expected result is Negative.  Fact Sheet for Patients:  BloggerCourse.com  Fact Sheet for Healthcare Providers:  SeriousBroker.it  This test is no t yet approved or cleared by the [place] FDA and  has been authorized for detection and/or diagnosis of SARS-CoV-2 by FDA under an Emergency Use Authorization (EUA). This EUA will remain  in effect (meaning this test can be used) for the duration of the COVID-19 declaration under Section 564(b)(1) of the Act, 21 U.S.C.section 360bbb-3(b)(1), unless the authorization is terminated  or revoked sooner.       Influenza A by PCR NEGATIVE NEGATIVE Final   Influenza B by PCR NEGATIVE NEGATIVE Final    Comment: (NOTE) The Xpert Xpress SARS-CoV-2/FLU/RSV plus assay is intended as an aid in the diagnosis of influenza from Nasopharyngeal swab specimens and should not be used as a sole basis for treatment. Nasal washings and aspirates are unacceptable for Xpert Xpress SARS-CoV-2/FLU/RSV testing.  Fact Sheet for Patients: BloggerCourse.com  Fact Sheet for Healthcare Providers: SeriousBroker.it  This test is not yet approved or cleared by the [place] FDA and has been authorized for detection and/or diagnosis of SARS-CoV-2 by FDA under an Emergency Use Authorization (EUA). This EUA will remain in effect (meaning this test can be used) for the duration of the COVID-19 declaration under Section 564(b)(1) of the Act, 21 U.S.C. section 360bbb-3(b)(1), unless the authorization is terminated or revoked.     Resp Syncytial Virus by PCR NEGATIVE NEGATIVE Final    Comment: (NOTE) Fact Sheet for Patients: BloggerCourse.com  Fact Sheet for Healthcare Providers: SeriousBroker.it  This test is not yet approved or cleared by the [place] FDA  and has been authorized for detection and/or diagnosis of SARS-CoV-2 by FDA under an Emergency Use Authorization (EUA). This EUA will remain in effect (meaning this test can be used) for the duration of the COVID-19 declaration under Section 564(b)(1) of the Act, 21 U.S.C. section 360bbb-3(b)(1), unless the authorization is terminated or revoked.  Performed at [facility], [address], [place], [place] [address]          Radiology Studies: CT Angio Chest Pulmonary Embolism (PE) W or WO Contrast Result Date: 08/27/2023 CLINICAL DATA:  Pulmonary embolism (PE) suspected, high prob. Shortness of breath. Tachycardia. EXAM: CT ANGIOGRAPHY CHEST WITH CONTRAST TECHNIQUE: Multidetector CT imaging of  the chest was performed using the standard protocol during bolus administration of intravenous contrast. Multiplanar CT image reconstructions and MIPs were obtained to evaluate the vascular anatomy. RADIATION DOSE REDUCTION: This exam was performed according to the departmental dose-optimization program which includes automated exposure control, adjustment of the mA and/or kV according to patient size and/or use of iterative reconstruction technique. CONTRAST:  75mL OMNIPAQUE IOHEXOL 350 MG/ML SOLN COMPARISON:  CT scan chest from 08/26/2023. FINDINGS: Cardiovascular: No evidence of embolism to the proximal subsegmental pulmonary artery level. Top normal cardiac size. No pericardial effusion. No aortic aneurysm. There are coronary artery calcifications, in keeping with coronary artery disease. Mediastinum/Nodes: Visualized thyroid gland appears grossly unremarkable. No solid / cystic mediastinal masses. The esophagus is nondistended precluding optimal assessment. No axillary, mediastinal or hilar lymphadenopathy by size criteria. Lungs/Pleura: The central tracheo-bronchial tree is patent. There are patchy areas of linear, plate-like atelectasis and/or scarring throughout bilateral lungs. No mass or  consolidation. No pleural effusion or pneumothorax. No suspicious lung nodules. Upper Abdomen: Visualized upper abdominal viscera within normal limits. Musculoskeletal: The visualized soft tissues of the chest wall are grossly unremarkable. No suspicious osseous lesions. There are mild multilevel degenerative changes in the visualized spine. Review of the MIP images confirms the above findings. IMPRESSION: 1. No embolism to the proximal subsegmental pulmonary artery level. 2. No lung mass, consolidation, pleural effusion or pneumothorax. 3. Other nonacute observations, as described above. Electronically Signed   By: Jules Schick M.D.   On: 08/27/2023 10:33   CT CHEST WO CONTRAST Result Date: 08/26/2023 CLINICAL DATA:  Pneumonia, fever EXAM: CT CHEST WITHOUT CONTRAST TECHNIQUE: Multidetector CT imaging of the chest was performed following the standard protocol without IV contrast. RADIATION DOSE REDUCTION: This exam was performed according to the departmental dose-optimization program which includes automated exposure control, adjustment of the mA and/or kV according to patient size and/or use of iterative reconstruction technique. COMPARISON:  Chest radiograph dated 08/26/2023 FINDINGS: Cardiovascular: The heart is top-normal in size. No pericardial effusion. No evidence of thoracic aortic aneurysm. Mild coronary atherosclerosis of the right coronary artery. Mediastinum/Nodes: No suspicious mediastinal lymphadenopathy. Lungs/Pleura: Evaluation of the lung parenchyma is constrained by respiratory motion. Within that constraint, there are no suspicious pulmonary nodules. Mild linear scarring in the bilateral lung apices. Mild linear scarring at the lateral right lung base. No focal consolidation. No pleural effusion or pneumothorax. Upper Abdomen: Visualized upper abdomen is notable for hepatic steatosis. Musculoskeletal: S shaped thoracolumbar scoliosis with degenerative changes. IMPRESSION: No acute  cardiopulmonary disease. Specifically, no findings suspicious for pneumonia. Electronically Signed   By: Charline Bills M.D.   On: 08/26/2023 19:28   DG Chest 1 View Result Date: 08/26/2023 CLINICAL DATA:  Fever. EXAM: CHEST  1 VIEW COMPARISON:  07/10/2015 FINDINGS: Very low lung volumes limit assessment. Stable heart size, upper normal. Streaky opacity at the left lung base. No confluent consolidation. No pleural effusion or pneumothorax. IMPRESSION: Very low lung volumes limit assessment. Streaky opacity at the left lung base may be atelectasis or pneumonia. Electronically Signed   By: Narda Rutherford M.D.   On: 08/26/2023 18:00   DG Knee Right Port Result Date: 08/26/2023 CLINICAL DATA:  Right knee pain. EXAM: PORTABLE RIGHT KNEE - 1-2 VIEW COMPARISON:  Radiographs and CT scan of same day. FINDINGS: Single AP image of right knee demonstrates continued lateral dislocation of patella. IMPRESSION: Continued lateral dislocation of patella. Electronically Signed   By: Lupita Raider M.D.   On: 08/26/2023 14:52  CT Knee Right Wo Contrast Result Date: 08/26/2023 CLINICAL DATA:  Knee trauma.  Patellar dislocation. EXAM: CT OF THE RIGHT KNEE WITHOUT CONTRAST TECHNIQUE: Multidetector CT imaging of the right knee was performed according to the standard protocol. Multiplanar CT image reconstructions were also generated. RADIATION DOSE REDUCTION: This exam was performed according to the departmental dose-optimization program which includes automated exposure control, adjustment of the mA and/or kV according to patient size and/or use of iterative reconstruction technique. COMPARISON:  X-rays from earlier same day FINDINGS: Bones/Joint/Cartilage Lateral patellar dislocation evident with small to moderate joint effusion. Tiny cortical fragments medial to the dislocated patella are likely related to patellar avulsion injury and correspond to the curvilinear density seen on previous x-ray. There is an associated  fracture involving the nonweightbearing cortex of the anterolateral right femoral condyle. No evidence for an acute fracture involving the tibial plateau or proximal fibula. There is some loss of joint space in the medial compartment. Ligaments Suboptimally assessed by CT. Muscles and Tendons The patellar tendon appears intact but a portion may be avulsed from the inferomedial patella. Quadriceps tendon appears intact but partial avulsion of the insertion cannot be excluded. Soft tissues Edema/hemorrhage is noted in the soft tissues about the anterior knee. IMPRESSION: 1. Lateral patellar dislocation with small to moderate joint effusion. 2. Tiny cortical fragments medial to the dislocated patella are likely related to patellar avulsion injury at the patellar tendon insertion and correspond to the curvilinear density seen on previous x-ray. 3. Associated fracture involving the nonweightbearing cortex of the anterolateral right femoral condyle. 4. The patellar tendon appears intact but a portion may be avulsed from the medial patella. Avulsion of the medial quadriceps tendon insertion not excluded. Electronically Signed   By: Kennith Center M.D.   On: 08/26/2023 07:39   DG Hip Unilat W or Wo Pelvis 2-3 Views Right Result Date: 08/26/2023 CLINICAL DATA:  Fall.  Hip pain. EXAM: DG HIP (WITH OR WITHOUT PELVIS) 2-3V RIGHT COMPARISON:  None Available. FINDINGS: There is no evidence of hip fracture or dislocation. There is no evidence of arthropathy or other focal bone abnormality. IMPRESSION: Negative. Electronically Signed   By: Kennith Center M.D.   On: 08/26/2023 05:52   DG Knee 2 Views Right Result Date: 08/26/2023 CLINICAL DATA:  Pain.  Possible patellar dislocation. EXAM: RIGHT KNEE - 1-2 VIEW COMPARISON:  None Available. FINDINGS: Study is limited by positioning and bony demineralization. Patella appears laterally dislocated and there is probably an associated displaced fracture involving the inferior pole of  the patella. Oblique lateral projections shows a curvilinear focus of mineralization anterior to the patella, indeterminate but potentially related to cortical fracture fragment. Joint effusion suspected. IMPRESSION: Patella appears laterally dislocated and there is probably an associated displaced fracture involving the inferior pole of the patella. Curvilinear focus of mineralization anterior to the patella is indeterminate. CT imaging of the knee could be used for definitive characterization as clinically warranted. Electronically Signed   By: Kennith Center M.D.   On: 08/26/2023 05:51        Scheduled Meds:  acetaminophen  1,000 mg Oral Q8H   docusate sodium  100 mg Oral BID   enoxaparin (LOVENOX) injection  40 mg Subcutaneous Q24H   gabapentin  100 mg Oral TID   insulin aspart  0-9 Units Subcutaneous Q4H   insulin glargine-yfgn  25 Units Subcutaneous QHS   ketorolac  15 mg Intravenous Q6H   ketorolac  15 mg Intravenous Q6H   levothyroxine  50  mcg Oral Q0600   lisinopril  40 mg Oral Daily   metoprolol succinate  25 mg Oral Daily   [START ON 08/28/2023] multivitamin with minerals  1 tablet Oral Daily   mupirocin ointment  1 Application Nasal BID   pantoprazole  80 mg Oral Daily   [START ON 08/28/2023] Ensure Max Protein  11 oz Oral BID   Continuous Infusions:  cefTRIAXone (ROCEPHIN)  IV 2 g (08/27/23 1014)     LOS: 0 days     Tresa Moore, MD [facility]   If 7PM-7AM, please contact night-coverage  08/27/2023, 1:06 PM

## 2023-08-27 NOTE — Progress Notes (Signed)
 Initial Nutrition Assessment  DOCUMENTATION CODES:   Morbid obesity  INTERVENTION:   Ensure Max protein supplement po BID, each supplement provides 150kcal and 30g of protein.  MVI po daily   Pt at refeed risk; recommend monitor potassium, magnesium and phosphorus labs daily until stable  Daily weights   Check vitamin D level  NUTRITION DIAGNOSIS:   Increased nutrient needs related to post-op healing as evidenced by estimated needs.  GOAL:   Patient will meet greater than or equal to 90% of their needs  MONITOR:   PO intake, Supplement acceptance, Labs, Weight trends, Skin, I & O's  REASON FOR ASSESSMENT:   Consult Assessment of nutrition requirement/status  ASSESSMENT:   28 y/o female with h/o Prader-Willi syndrome, intellectual delay, hypogonadotropic hypogonadism, NAFLD, OSA, IDDM, morbid obesity, thyroid disease and thrombocytopenia who is admitted with irreducible patellar dislocation after a fall.  RD working remotely.  Pt NPO for possible surgery today. RD will add supplements and MVI to help pt meet her estimated needs. Pt is likely at refeed risk. RD will check vitamin D level to r/o deficiency. Per chart, pt appears to have weight gain pta. Pt's UBW appears to be ~225lbs. RD will obtain history and exam at follow-up. Would recommend outpatient referral to [facility] for diabetes and weight loss education once post op healing is complete.   Medications reviewed and include: colace, lovenox, insulin, synthroid, protonix, ceftriaxone  Labs reviewed: K 4.5 wnl, BUN 24(H) Cbgs- 156, 188, 146 x 48 hrs  AIC 9.5(H)- 2/20  NUTRITION - FOCUSED PHYSICAL EXAM: Unable to perform at this time   Diet Order:   Diet Order             Diet NPO time specified  Diet effective midnight                  EDUCATION NEEDS:   Not appropriate for education at this time  Skin:  Skin Assessment: Reviewed RN Assessment  Last BM:  pta  Height:   Ht Readings from Last 1  Encounters:  08/26/23 5\' 2"  (1.575 m)    Weight:   Wt Readings from Last 1 Encounters:  08/26/23 127 kg    Ideal Body Weight:  50 kg  BMI:  Body mass index is 51.21 kg/m.  Estimated Nutritional Needs:   Kcal:  2200-2500kcal/day  Protein:  110-125g/day  Fluid:  1.6-1.8L/day  Betsey Holiday MS, RD, LDN If unable to be reached, please send secure chat to "RD inpatient" available from 8:00a-4:00p daily

## 2023-08-27 NOTE — Inpatient Diabetes Management (Addendum)
 Inpatient Diabetes Program Recommendations  AACE/ADA: New Consensus Statement on Inpatient Glycemic Control (2015)  Target Ranges:  Prepandial:   less than 140 mg/dL      Peak postprandial:   less than 180 mg/dL (1-2 hours)      Critically ill patients:  140 - 180 mg/dL   Lab Results  Component Value Date   GLUCAP 156 (H) 08/27/2023   HGBA1C 9.5 (H) 08/26/2023    Latest Reference Range & Units 08/26/23 16:35 08/26/23 22:10 08/27/23 08:18  Glucose-Capillary 70 - 99 mg/dL 604 (H) 540 (H) 981 (H)  (H): Data is abnormally high  Diabetes history: DM2 Outpatient Diabetes medications: Lantus 50 units daily, Novolog 4 units tid meal coverage, Jardiance 25 mg daily, Metformin 1 gm bid Current orders for Inpatient glycemic control: Lantus 50 units nightly, Novolog 10 units tid meal coverage, Novolog 0-15 units tid, 0-5 units hs  A1c decreased from 11.1 in December 2024 to currently 9.5.  Inpatient Diabetes Program Recommendations:   Please consider: -D/C Lantus (not on formulary) -Add Semglee 25 units nightly -Decrease Novolog meal coverage to 4 units tid if eats 50% meals -Decrease Novolog correction to 0-9 units tid, 0-5 units hs if eating or 0-9 units q 4 hrs. While NPO.  Thank you, Linda Mason. Linda Mulkern, RN, MSN, CDCES  Diabetes Coordinator Inpatient Glycemic Control Team Team Pager [phone] (8am-5pm) 08/27/2023 11:14 AM

## 2023-08-27 NOTE — Progress Notes (Signed)
 Pt returned to 159 via bed after surgery. Received bedside report from Gavin Pound, RN in PACU. See focused reassessment.

## 2023-08-27 NOTE — Op Note (Addendum)
 Operative Note    SURGERY DATE: 08/27/2023   PRE-OP DIAGNOSIS:  1. Right knee irreducible patellar dislocation 2. Right knee lateral femoral condyle impaction fracture 3. Right knee medial patella impaction fracture   POST-OP DIAGNOSIS:  1. Right knee irreducible patellar dislocation 2. Right knee lateral femoral condyle impaction fracture 3. Right knee medial patella impaction fracture  PROCEDURES:  1. Right knee arthroscopic assisted patella reduction 2. Right knee arthroscopy, lateral release 3. Right knee arthroscopic loose body removal 4. Right knee open management of lateral femoral condyle and medial patella fracture   SURGEON: Rosealee Albee, MD   ANESTHESIA: Gen   ESTIMATED BLOOD LOSS: minimal   TOTAL IV FLUIDS: per anesthesia   INDICATION(S):  Dayani Winbush is a 28 y.o. female who had a fall and sustained a first-time R patellar dislocation after a fall while getting out of bed. Multiple reduction attempts were made in the ED, but these were unsuccessful.  Preoperative imaging showed medial patella and anterolateral femoral condyle impaction fractures with loose fragments in the joint.  Patient was deemed to be preoperatively optimized after extensive workup of hypoxia, tachycardia, and low-grade fever was performed.  Preoperatively, we had discussed potential MPFL reconstruction concurrently, but on the recommendation of the anesthesia team with patient's underlying hypoxia, tachycardia, and low-grade fever without explanation, they recommended as little surgery as possible today so MPFL reconstruction was not performed.   OPERATIVE FINDINGS:    Examination under anesthesia: A careful examination under anesthesia was performed.  Passive range of motion was: Hyperextension: 2.  Flexion: 80.  Significant valgus to the knee.  Lateral patellar dislocation that could not be moved.  Intra-operative findings: A thorough arthroscopic examination of the knee was performed.   The findings are: 1. Suprapatellar pouch: Significant retinaculum folded within the patellofemoral joint with lateral patellar dislocation 2. Undersurface of median ridge: Grade 2-3 degenerative changes 3. Medial patellar facet: Grade 1 softening 4. Lateral patellar facet: Grade 1 softening 5. Trochlea: Grade 1 degenerative changes 6. Lateral gutter/popliteus tendon: Normal 7. Hoffa's fat pad: Inflamed 8. Medial gutter/plica: Normal 9. ACL: Normal 10. PCL: Normal 11. Medial meniscus: normal 12. Medial compartment cartilage: Grade 1 degenerative changes to the tibial plateau; normal weightbearing surface of the medial femoral condyle 13. Lateral meniscus: Normal 14. Lateral compartment cartilage: Grade 1 changes to the tibial plateau; normal weightbearing surface of the lateral femoral condyle; significant impaction fracture with loss of bone and cartilage of the anterolateral femoral condyle in a nonweightbearing portion   OPERATIVE REPORT:     I identified Ardelle Balls in the pre-operative holding area. I marked the operative knee with my initials. I reviewed the risks and benefits of the proposed surgical intervention and the patient (and/or patient's guardian) wished to proceed. The patient was transferred to the operative suite and placed in the supine position with all bony prominences padded.  Anesthesia was administered. IV antibiotics were given. The extremity was then prepped and draped in standard fashion. A time out was performed confirming the correct extremity, correct patient, and correct procedure.   Fluoroscopy was utilized to confirm patellar dislocation.  I attempted to reduce the patella, but there was no significant movement of the patella.  Arthroscopy portals were marked. The anterolateral portal was established with an 11 blade. The arthroscope was placed in the anterolateral portal and then into the suprapatellar pouch.  There was significant hemarthrosis within  the joint.  Next the prior anteromedial portal was established. A shaver was introduced  to help move fluid through the joint and improve visualization. A diagnostic arthroscopy was performed with findings as indicated above. The patella was dislocated laterally.  There was significant infolding of the retinaculum within the patellofemoral joint.  There were multiple loose bodies, the largest measuring approximately 8 x 10 mm.  This appeared to be a chondral fragment off of the anterolateral femoral condyle.  All loose bodies were removed with an oscillating shaver or an arthroscopic grasper.   To perform the patella reduction, I first attempted to push the retinaculum to its native position and then levered the patella into a reduced position, but this was unsuccessful.  Therefore, I used an arthroscopic shaver to excise part of the retinaculum.  I was then able to lever the patella into a reduced position.  There was still some mild lateral subluxation so lateral release was performed.  This was performed from the superior border of the patella just distal to the vastus lateralis insertion to the anterolateral portal distally.  This allowed the patella to sit in a more centralized position.  Lastly the cartilaginous edges of the anterolateral femoral condyle impaction fracture were debrided using an oscillating shaver.  There was essentially a bony defect in this region.  It appeared to be a nonweightbearing portion of the lateral femoral condyle. The medial patella fracture remnant was also excised using an oscillating shaver. The patient was taken through a range of motion with knee extension and flexion, and the patella was noted to remain in a reduced position.  Fluoroscopy was utilized to confirm appropriate reduction.  Arthroscopic fluid was removed from the joint.   The portals were closed with 3-0 Nylon suture. Local anesthetic was injected about the portal sites and into the knee joint. Sterile  dressings included Xeroform, 4x4s, Sof-Rol, and Bias wrap. The patient was then awakened and taken to the PACU without complication.     POSTOPERATIVE PLAN: Aspirin 325 mg daily x 2 weeks for DVT prophylaxis. Weight-bearing as tolerated with a knee immobilizer.  Knee immobilizer may be off when patient is at rest. Follow up in 2 weeks as outpatient for suture removal.

## 2023-08-27 NOTE — Transfer of Care (Signed)
 Immediate Anesthesia Transfer of Care Note  Patient: Linda Mason  Procedure(s) Performed: ARTHROSCOPY KNEE (Right: Knee)  Patient Location: PACU  Anesthesia Type:General  Level of Consciousness: drowsy and responds to stimulation  Airway & Oxygen Therapy: Patient Spontanous Breathing and Patient connected to face mask oxygen  Post-op Assessment: Report given to RN and Post -op Vital signs reviewed and stable  Post vital signs: Reviewed and stable  Last Vitals:  Vitals Value Taken Time  BP 130/72 08/27/23 1528  Temp 36.6 C 08/27/23 1528  Pulse 102 08/27/23 1528  Resp 20 08/27/23 1528  SpO2 94 % 08/27/23 1528  Vitals shown include unfiled device data.  Last Pain:  Vitals:   08/27/23 1528  TempSrc: Temporal  PainSc:          Complications: No notable events documented.

## 2023-08-27 NOTE — Plan of Care (Signed)
  Problem: Education: Goal: Ability to describe self-care measures that may prevent or decrease complications (Diabetes Survival Skills Education) will improve Outcome: Not Progressing   Problem: Coping: Goal: Ability to adjust to condition or change in health will improve Outcome: Not Progressing   Problem: Fluid Volume: Goal: Ability to maintain a balanced intake and output will improve Outcome: Not Progressing   Problem: Tissue Perfusion: Goal: Adequacy of tissue perfusion will improve Outcome: Not Progressing   Problem: Activity: Goal: Risk for activity intolerance will decrease Outcome: Not Progressing   Problem: Coping: Goal: Level of anxiety will decrease Outcome: Not Progressing

## 2023-08-27 NOTE — Anesthesia Procedure Notes (Addendum)
 Procedure Name: Intubation Date/Time: 08/27/2023 1:37 PM  Performed by: Otho Perl, CRNAPre-anesthesia Checklist: Patient identified, Patient being monitored, Timeout performed, Emergency Drugs available and Suction available Patient Re-evaluated:Patient Re-evaluated prior to induction Oxygen Delivery Method: Circle system utilized Preoxygenation: Pre-oxygenation with 100% oxygen Induction Type: IV induction Ventilation: Mask ventilation without difficulty Laryngoscope Size: 3 and McGrath Grade View: Grade I Tube type: Oral Tube size: 6.5 mm Number of attempts: 1 Airway Equipment and Method: Stylet Placement Confirmation: ETT inserted through vocal cords under direct vision, positive ETCO2 and breath sounds checked- equal and bilateral Secured at: 20 cm Tube secured with: Tape Dental Injury: Teeth and Oropharynx as per pre-operative assessment  Comments: Performed by Nelwyn Salisbury, CRNA

## 2023-08-27 NOTE — Anesthesia Preprocedure Evaluation (Signed)
 Anesthesia Evaluation  Patient identified by MRN, date of birth, ID band Patient awake    Reviewed: Allergy & Precautions, H&P , NPO status , Patient's Chart, lab work & pertinent test results  History of Anesthesia Complications Negative for: history of anesthetic complications  Airway Mallampati: IV  TM Distance: >3 FB Neck ROM: full    Dental  (+) Poor Dentition, Missing   Pulmonary neg pulmonary ROS   Pulmonary exam normal        Cardiovascular Exercise Tolerance: Poor hypertension,  Rhythm:Regular Rate:Tachycardia  ECHO 2022: INTERPRETATION  NORMAL LEFT VENTRICULAR SYSTOLIC FUNCTION   WITH MILD LVH  NORMAL RIGHT VENTRICULAR SYSTOLIC FUNCTION  TRIVIAL REGURGITATION NOTED (See above)  NO VALVULAR STENOSIS  IRREGULAR HEART RHYTHM CAPTURED THROUGHOUT EXAM  LVEF WITHIN LOWER LIMITS OF NORMAL 50-55%  Aortic: NORMAL GRADIENTS  Mitral: TRIVIAL MR  Tricuspid: TRIVIAL TR  Pulmonic: NORMAL GRADIENTS  *PATIENT UNABLE TO LIE LEFT LATERAL DECUBITUS; EXAM DONE WHILE PATIENT SUPIN     Neuro/Psych negative neurological ROS  negative psych ROS   GI/Hepatic negative GI ROS, Neg liver ROS,,,  Endo/Other  diabetes, Poorly Controlled, Type 2Hypothyroidism  Class 4 obesity  Renal/GU      Musculoskeletal   Abdominal  (+) + obese  Peds  Hematology negative hematology ROS (+)   Anesthesia Other Findings Prader-Willi syndrome with intellectual disability  BMI    Body Mass Index: 51.21 kg/m      Reproductive/Obstetrics negative OB ROS                             Anesthesia Physical Anesthesia Plan  ASA: 3  Anesthesia Plan: General ETT   Post-op Pain Management: Ofirmev IV (intra-op)* and Toradol IV (intra-op)*   Induction: Intravenous  PONV Risk Score and Plan: 2 and Ondansetron, Dexamethasone and Midazolam  Airway Management Planned: Oral ETT  Additional Equipment:   Intra-op Plan:    Post-operative Plan: Extubation in OR  Informed Consent: I have reviewed the patients History and Physical, chart, labs and discussed the procedure including the risks, benefits and alternatives for the proposed anesthesia with the patient or authorized representative who has indicated his/her understanding and acceptance.     Dental Advisory Given and Consent reviewed with POA  Plan Discussed with: CRNA and Surgeon  Anesthesia Plan Comments: (Patient consented for risks of anesthesia including but not limited to:  - adverse reactions to medications - damage to eyes, teeth, lips or other oral mucosa - nerve damage due to positioning  - sore throat or hoarseness - Damage to heart, brain, nerves, lungs, other parts of body or loss of life  Patient voiced understanding and assent.)       Anesthesia Quick Evaluation

## 2023-08-27 NOTE — H&P (Addendum)
 H&P reviewed. Medical workup for hypoxia, fever, tachycardia has been negative so far. After discussion with primary hospitalist team, patient appears to be as medically optimized as possible for the OR given time constraints.   Additionally, after discussion with Anesthesia team, recommendation was to forego MPFL reconstruction so that patient is under anesthesia for as little time as possible.  Plan for R knee arthroscopy, patella reduction, loose body removal.

## 2023-08-27 NOTE — Plan of Care (Signed)
  Problem: Pain Managment: Goal: General experience of comfort will improve and/or be controlled Outcome: Progressing   Problem: Safety: Goal: Ability to remain free from injury will improve Outcome: Progressing   Problem: Skin Integrity: Goal: Risk for impaired skin integrity will decrease Outcome: Progressing

## 2023-08-27 NOTE — Plan of Care (Signed)
  Problem: Coping: Goal: Ability to adjust to condition or change in health will improve Outcome: Progressing   Problem: Skin Integrity: Goal: Risk for impaired skin integrity will decrease Outcome: Progressing   Problem: Education: Goal: Knowledge of General Education information will improve Description: Including pain rating scale, medication(s)/side effects and non-pharmacologic comfort measures Outcome: Progressing   Problem: Coping: Goal: Level of anxiety will decrease Outcome: Progressing   Problem: Elimination: Goal: Will not experience complications related to bowel motility Outcome: Progressing Goal: Will not experience complications related to urinary retention Outcome: Progressing   Problem: Pain Managment: Goal: General experience of comfort will improve and/or be controlled Outcome: Progressing   Problem: Safety: Goal: Ability to remain free from injury will improve Outcome: Progressing   Problem: Skin Integrity: Goal: Risk for impaired skin integrity will decrease Outcome: Progressing

## 2023-08-28 DIAGNOSIS — S83004D Unspecified dislocation of right patella, subsequent encounter: Secondary | ICD-10-CM | POA: Diagnosis not present

## 2023-08-28 LAB — MAGNESIUM: Magnesium: 1.9 mg/dL (ref 1.7–2.4)

## 2023-08-28 LAB — BASIC METABOLIC PANEL
Anion gap: 10 (ref 5–15)
BUN: 23 mg/dL — ABNORMAL HIGH (ref 6–20)
CO2: 23 mmol/L (ref 22–32)
Calcium: 8.4 mg/dL — ABNORMAL LOW (ref 8.9–10.3)
Chloride: 103 mmol/L (ref 98–111)
Creatinine, Ser: 0.42 mg/dL — ABNORMAL LOW (ref 0.44–1.00)
GFR, Estimated: >60 mL/min (ref >60)
Glucose, Bld: 232 mg/dL — ABNORMAL HIGH (ref 70–99)
Potassium: 4.4 mmol/L (ref 3.5–5.1)
Sodium: 136 mmol/L (ref 135–145)

## 2023-08-28 LAB — GLUCOSE, CAPILLARY
Glucose-Capillary: 186 mg/dL — ABNORMAL HIGH (ref 70–99)
Glucose-Capillary: 203 mg/dL — ABNORMAL HIGH (ref 70–99)
Glucose-Capillary: 245 mg/dL — ABNORMAL HIGH (ref 70–99)
Glucose-Capillary: 253 mg/dL — ABNORMAL HIGH (ref 70–99)
Glucose-Capillary: 257 mg/dL — ABNORMAL HIGH (ref 70–99)
Glucose-Capillary: 287 mg/dL — ABNORMAL HIGH (ref 70–99)
Glucose-Capillary: 291 mg/dL — ABNORMAL HIGH (ref 70–99)
Glucose-Capillary: 296 mg/dL — ABNORMAL HIGH (ref 70–99)
Glucose-Capillary: 327 mg/dL — ABNORMAL HIGH (ref 70–99)

## 2023-08-28 LAB — VITAMIN D 25 HYDROXY (VIT D DEFICIENCY, FRACTURES): Vit D, 25-Hydroxy: 17.03 ng/mL — ABNORMAL LOW (ref 30–100)

## 2023-08-28 LAB — PHOSPHORUS: Phosphorus: 3.1 mg/dL (ref 2.5–4.6)

## 2023-08-28 MED ORDER — ONDANSETRON HCL 4 MG PO TABS: 4.0000 mg | ORAL_TABLET | Freq: Four times a day (QID) | ORAL | 0 refills | Status: DC | PRN

## 2023-08-28 MED ORDER — INSULIN GLARGINE-YFGN 100 UNIT/ML ~~LOC~~ SOLN
30.0000 [IU] | Freq: Every day | SUBCUTANEOUS | Status: DC
  Administered 2023-08-28 – 2023-08-29 (×2): 30 [IU] via SUBCUTANEOUS
  Filled 2023-08-28 (×3): qty 0.3

## 2023-08-28 MED ORDER — ACETAMINOPHEN 500 MG PO TABS: 1000.0000 mg | ORAL_TABLET | Freq: Three times a day (TID) | ORAL | 0 refills | Status: DC

## 2023-08-28 MED ORDER — OXYCODONE HCL 5 MG PO TABS
5.0000 mg | ORAL_TABLET | ORAL | 0 refills | Status: DC | PRN
Start: 2023-08-28 — End: 2023-09-01

## 2023-08-28 MED ORDER — DOCUSATE SODIUM 100 MG PO CAPS: 100.0000 mg | ORAL_CAPSULE | Freq: Two times a day (BID) | ORAL | 0 refills | Status: DC

## 2023-08-28 MED ORDER — ASPIRIN 325 MG PO TBEC
325.0000 mg | DELAYED_RELEASE_TABLET | Freq: Every day | ORAL | 0 refills | Status: DC
Start: 2023-08-28 — End: 2023-09-11

## 2023-08-28 MED ORDER — NAPROXEN 500 MG PO TABS: 500.0000 mg | ORAL_TABLET | Freq: Two times a day (BID) | ORAL | 0 refills | Status: DC

## 2023-08-28 NOTE — Evaluation (Signed)
 Occupational Therapy Evaluation Patient Details Name: Linda Mason [place] MRN: [id number] DOB: 31-Jul-1995 Today's Date: 08/28/2023   History of Present Illness   Pt admitted to [facility] on 08/26/23 for c/o mechanical fall OOB that resulted in R patellar dislocation. S/p R knee arthroscopy with assisted patella reduction and lateral release. Significant PMH includes: Prader-Willi syndrome, intellectual delay, and diabetes.     Clinical Impressions Ms. Lycan was seen for OT/PT co-evaluation this date. In-person hospital interpreting services utilized t/o session to facilitate communication primarily between caregivers and therapist as pt speaks English but is limited by cognition. Pt father at bedside t/o session and reports that prior to hospital admission, pt was generally independent with ADL management. She was ambulatory without a device and performed all bathing, dressing, and self care tasks independently. Pt has family available to assist 24/7 if needed. Pt lives with her mother, father, and siblings in a 1 level home with ~ 5 STE. Pt presents to acute OT demonstrating impaired ADL performance and functional mobility 2/2 decreased LB access, increased pain with mobility, poor balance, and decreased safety awareness (See OT problem list for additional functional deficits). Pt currently requires MAX A for LB ADL management with +2 assist for STS t/fs, MAX A +2 for bed mobility, and +2 MIN A to take a few side steps toward HOB.  Pt would benefit from skilled OT services to address noted impairments and functional limitations (see below for any additional details) in order to maximize safety and independence while minimizing falls risk and caregiver burden. Anticipate the need for follow up therapy services upon acute hospital DC. Pt would benefit from acute intensive rehabilitative services to maximize recovery and return to PLOF (>/= 3 hours/day).      If plan is discharge home, recommend the  following:   Two people to help with walking and/or transfers;Two people to help with bathing/dressing/bathroom;Assist for transportation;Help with stairs or ramp for entrance;Supervision due to cognitive status;Assistance with cooking/housework     Functional Status Assessment   Patient has had a recent decline in their functional status and demonstrates the ability to make significant improvements in function in a reasonable and predictable amount of time.     Equipment Recommendations   Other (comment) (defer to next venue of care)     Recommendations for Other Services         Precautions/Restrictions   Precautions Precautions: Knee Precaution Booklet Issued: No Recall of Precautions/Restrictions: Impaired Required Braces or Orthoses: Knee Immobilizer - Right Knee Immobilizer - Right: On when out of bed or walking (except when resting (supine) or with bathing) Restrictions Weight Bearing Restrictions Per Provider Order: Yes RLE Weight Bearing Per Provider Order: Weight bearing as tolerated     Mobility Bed Mobility Overal bed mobility: Needs Assistance Bed Mobility: Supine to Sit, Sit to Supine     Supine to sit: Max assist, +2 for physical assistance Sit to supine: Max assist, +2 for physical assistance   General bed mobility comments: +2 assist for trunk and BLE facilitation to perform supine<>sit transfers; max multimodal cues for safety, sequencing, and hand placement; increased time/effort secondary to increased pain levels    Transfers Overall transfer level: Needs assistance Equipment used: Rolling walker (2 wheels) Transfers: Sit to/from Stand Sit to Stand: Min assist, +2 physical assistance                  Balance Overall balance assessment: Needs assistance Sitting-balance support: No upper extremity supported, Feet supported Sitting balance-Leahy Scale:  Good Sitting balance - Comments: steady static sitting EOB during LB ADL  management, able to lean/reach outside BOS to don KI.   Standing balance support: Bilateral upper extremity supported, During functional activity, Reliant on assistive device for balance Standing balance-Leahy Scale: Poor Standing balance comment: min assist +2 for standing balance in RW                           ADL either performed or assessed with clinical judgement   ADL Overall ADL's : Needs assistance/impaired                                       General ADL Comments: Significantly functionally limited by increased pain, limited cardiopulmonary status, and decreased LB Access. Requires +2 MAX A for bed mobility. +2 MIN A to STS at EOB with consistent encouragement and reassurance. MAX A for LB wash up from STS after pt noted to be soiled with urine.     Vision Patient Visual Report: No change from baseline       Perception         Praxis         Pertinent Vitals/Pain Pain Assessment Pain Assessment: Faces Faces Pain Scale: Hurts whole lot Pain Location: R knee, increasing pain behaviors with mobility/touch Pain Descriptors / Indicators: Grimacing, Guarding Pain Intervention(s): Limited activity within patient's tolerance, Monitored during session, Repositioned     Extremity/Trunk Assessment Upper Extremity Assessment Upper Extremity Assessment: Overall WFL for tasks assessed (No focal weakness appreciated. Able to move BUE WNL during session. limited LB access 2/2 body habitus.)   Lower Extremity Assessment Lower Extremity Assessment: Defer to PT evaluation;RLE deficits/detail RLE Deficits / Details: s/p R knee arthroscopy, WBAT, KI donned prior to mobility, pt very pain limited this date. LLE Deficits / Details: not formally assessed due to increased RLE pain and perseveration; observed to be at least 3+/5 as pt is able to WB through LE for transfers and gait without buckling       Communication Communication Communication: No  apparent difficulties   Cognition Arousal: Alert Behavior During Therapy: WFL for tasks assessed/performed Cognition: History of cognitive impairments             OT - Cognition Comments: Pt is easily distracted but re-directable during session and benefits from gentle encouragement to engage in functional activity. Follows 1 step VCs with min increased cueing.                 Following commands: Intact       Cueing  General Comments   Cueing Techniques: Verbal cues      Exercises Other Exercises Other Exercises: Pt/caregiver educated on role of OT in acute setting, polar care management, safety, falls prevention strategies, and DC recommendations.   Shoulder Instructions      Home Living Family/patient expects to be discharged to:: Private residence Living Arrangements: Parent Available Help at Discharge: Family Type of Home: House Home Access: Stairs to enter Secretary/administrator of Steps: 5   Home Layout: One level     Bathroom Shower/Tub: Producer, television/film/video: Standard     Home Equipment: None          Prior Functioning/Environment Prior Level of Function : Independent/Modified Independent (In-person interpreter services utilized. Pt speaks English but parents do not and benefit from interpreter services 2/2  pt baseline cognition.)             Mobility Comments: Independent, no device. Denies additional falls. ADLs Comments: Independent per pt/father. Family is available to provide 24/7. Assist for IADL management primarily at baseline.    OT Problem List: Decreased strength;Decreased coordination;Decreased range of motion;Decreased activity tolerance;Decreased safety awareness;Impaired balance (sitting and/or standing);Decreased knowledge of use of DME or AE   OT Treatment/Interventions: Self-care/ADL training;Therapeutic exercise;Therapeutic activities;Balance training;DME and/or AE instruction;Patient/family education;Energy  conservation      OT Goals(Current goals can be found in the care plan section)   Acute Rehab OT Goals Patient Stated Goal: To go home OT Goal Formulation: With patient Time For Goal Achievement: 09/11/23 Potential to Achieve Goals: Good ADL Goals Pt Will Perform Grooming: sitting;with modified independence;with adaptive equipment;standing Pt Will Perform Lower Body Dressing: with adaptive equipment;sit to/from stand;with modified independence Pt Will Transfer to Toilet: bedside commode;ambulating;with set-up;with supervision Pt Will Perform Toileting - Clothing Manipulation and hygiene: sit to/from stand;with adaptive equipment;with caregiver independent in assisting;with supervision;with set-up   OT Frequency:  Min 1X/week    Co-evaluation PT/OT/SLP Co-Evaluation/Treatment: Yes Reason for Co-Treatment: Complexity of the patient's impairments (multi-system involvement);For patient/therapist safety;To address functional/ADL transfers PT goals addressed during session: Mobility/safety with mobility;Balance OT goals addressed during session: ADL's and self-care;Proper use of Adaptive equipment and DME      AM-PAC OT "6 Clicks" Daily Activity     Outcome Measure Help from another person eating meals?: A Little Help from another person taking care of personal grooming?: A Little Help from another person toileting, which includes using toliet, bedpan, or urinal?: A Little Help from another person bathing (including washing, rinsing, drying)?: A Little Help from another person to put on and taking off regular upper body clothing?: A Little Help from another person to put on and taking off regular lower body clothing?: A Little 6 Click Score: 18   End of Session Equipment Utilized During Treatment: Gait belt;Rolling walker (2 wheels) Nurse Communication: Mobility status  Activity Tolerance: Patient tolerated treatment well Patient left: in bed;with call bell/phone within reach;with  bed alarm set;with family/visitor present  OT Visit Diagnosis: Other abnormalities of gait and mobility (R26.89)                Time: 1610-9604 OT Time Calculation (min): 44 min Charges:  OT General Charges $OT Visit: 1 Visit OT Evaluation $OT Eval Moderate Complexity: 1 Mod OT Treatments $Self Care/Home Management : 8-22 mins  Rockney Ghee, M.S., OTR/L 08/28/23, 12:12 PM

## 2023-08-28 NOTE — Progress Notes (Signed)
 PROGRESS NOTE    Linda Mason [facility]  BJY:[id number] DOB: 07-23-1995 DOA: 08/26/2023 PCP: [facility]    Brief Narrative:   28 y.o. female with past medical history of Prader-Willi, syndrome, IDDM with insulin resistance, thyroidism, morbid obesity presented to ED with mechanical fall and acute right knee pain.  Medical team is consulted for diabetes control.  Noticed that the patient has been having poorly controlled diabetes with A1c consistently above 10 for the last 2 checks in 6 months.  At baseline patient uses Lantus 15 units daily and NovoLog 3 units 3 times daily AC plus metformin and Jardiance.   Patient also having breakthrough fevers.  Unclear etiology.  No white count, mild elevation in procalcitonin.  Infectious workup unrevealing thus far.  No PE.  BL LE [place] completed and pending.  No pneumonia, no UTI.  Given the circumstance, patient is as medically optimized as possible and low to moderate risk for complications.  Recs discussed with orthopedic surgery Dr Allena Katz  2/22: Patient underwent successful knee arthroscopy with orthopedics on 2/21.  Tolerated procedure well.  Postoperative pain moderate.  Assessment & Plan:   Principal Problem:   Patellar dislocation Active Problems:   Diabetes mellitus with neuropathy (HCC)   Morbid obesity (HCC)   Type 2 diabetes mellitus without complication, with long-term current use of insulin (HCC)   Hypoxia   IDDM with insulin resistance, poor control -Most recent A1c 11.1 in December 2024 Plan: Basal bolus regimen Mealtime insulin restarted Continue carb modified diet   Fever Unclear etiology.  Differential is broad.  No white count elevation, mild elevation in procalcitonin.  Workup unrevealing.  Neg for flu/covid/rsv.  No pneumonia on imaging, no PE, no UTI.  No DVT.  Fevers may be secondary to pain Plan: DC antibiotics Monitor vitals and fever curve  Morbid obesity Question of sleep apnea BMI  51.2.  Complicating factor in overall care and prognosis  Patellar dislocation status post knee arthroscopy Tolerated procedure well.  Knee immobilizer in place.  Aspirin 325 daily x 2 weeks Pain control, PT OT, outpatient follow-up with orthopedics in 2 weeks Per therapy will need CIR.     DVT prophylaxis: SQ lovenox Code Status: FULL Family Communication:Mother at bedside 2/21, father at bedside 2/22 Disposition Plan: Status is: Inpatient Remains inpatient appropriate because: Knee dislocation.  Fever of unknown etiology   Level of care: Med-Surg  Consultants:  Orthpedics  Procedures:  None  Antimicrobials: Rocephin    Subjective: Seen and examined.  Feels well overall..  Endorses pain in right knee.  Objective: Vitals:   08/28/23 0025 08/28/23 0421 08/28/23 0500 08/28/23 0817  BP: 117/72 127/82  121/78  Pulse: (!) 106 (!) 102  90  Resp: 16 16  17   Temp: 98.7 F (37.1 C) 98.1 F (36.7 C)  98.4 F (36.9 C)  TempSrc: Oral Oral    SpO2: 99% 95%  99%  Weight:   108.4 kg   Height:        Intake/Output Summary (Last 24 hours) at 08/28/2023 1124 Last data filed at 08/28/2023 0604 Gross per 24 hour  Intake 1475 ml  Output 2025 ml  Net -550 ml   Filed Weights   08/26/23 0343 08/28/23 0500  Weight: 127 kg 108.4 kg    Examination:  General exam: No acute distress Respiratory system: Clear to auscultation. Respiratory effort normal. Cardiovascular system: S1S2, RRR, no murmur Gastrointestinal system: Obese, soft, NT/ND, normal bowel sounds Central nervous system: Alert and oriented.  No focal neurological deficits. Extremities: Right knee immobilizer in place.  Decreased ROM.  Gait not assessed Skin: No rashes, lesions or ulcers Psychiatry: Judgement and insight appear normal. Mood & affect appropriate.     Data Reviewed: I have personally reviewed following labs and imaging studies  CBC: Recent Labs  Lab 08/26/23 1936  WBC 9.6  HGB 13.9  HCT 42.8   MCV 95.1  PLT 54*   Basic Metabolic Panel: Recent Labs  Lab 08/26/23 1936 08/26/23 1942 08/27/23 0650 08/28/23 0357  NA 135  --  139 136  K 4.5  --  4.5 4.4  CL 102  --  107 103  CO2 21*  --  23 23  GLUCOSE 153*  --  168* 232*  BUN 17  --  24* 23*  CREATININE 0.37*  --  0.51 0.42*  CALCIUM 8.9  --  8.8* 8.4*  MG  --  1.9  --  1.9  PHOS  --   --   --  3.1   GFR: Estimated Creatinine Clearance: 121.3 mL/min (A) (by C-G formula based on SCr of 0.42 mg/dL (L)). Liver Function Tests: Recent Labs  Lab 08/26/23 1936  AST 57*  ALT 74*  ALKPHOS 123  BILITOT 2.4*  PROT 6.9  ALBUMIN 3.3*   No results for input(s): "LIPASE", "AMYLASE" in the last 168 hours. No results for input(s): "AMMONIA" in the last 168 hours. Coagulation Profile: No results for input(s): "INR", "PROTIME" in the last 168 hours. Cardiac Enzymes: No results for input(s): "CKTOTAL", "CKMB", "CKMBINDEX", "TROPONINI" in the last 168 hours. BNP (last 3 results) No results for input(s): "PROBNP" in the last 8760 hours. HbA1C: Recent Labs    08/26/23 1936  HGBA1C 9.5*   CBG: Recent Labs  Lab 08/27/23 1724 08/27/23 2010 08/28/23 0019 08/28/23 0417 08/28/23 0814  GLUCAP 213* 298* 253* 203* 186*   Lipid Profile: No results for input(s): "CHOL", "HDL", "LDLCALC", "TRIG", "CHOLHDL", "LDLDIRECT" in the last 72 hours. Thyroid Function Tests: No results for input(s): "TSH", "T4TOTAL", "FREET4", "T3FREE", "THYROIDAB" in the last 72 hours. Anemia Panel: No results for input(s): "VITAMINB12", "FOLATE", "FERRITIN", "TIBC", "IRON", "RETICCTPCT" in the last 72 hours. Sepsis Labs: Recent Labs  Lab 08/26/23 1936 08/26/23 1942 08/26/23 2151  PROCALCITON  --  0.38  --   LATICACIDVEN 1.4  --  1.3    Recent Results (from the past 240 hours)  Culture, blood (Routine X 2) w Reflex to ID Panel     Status: None (Preliminary result)   Collection Time: 08/26/23  7:38 PM   Specimen: BLOOD  Result Value Ref Range  Status   Specimen Description BLOOD BLOOD LEFT ARM  Final   Special Requests   Final    BOTTLES DRAWN AEROBIC AND ANAEROBIC Blood Culture adequate volume   Culture   Final    NO GROWTH 2 DAYS Performed at [facility], [address] [place], [place] [address]    Report Status PENDING  Incomplete  Culture, blood (Routine X 2) w Reflex to ID Panel     Status: None (Preliminary result)   Collection Time: 08/26/23  7:42 PM   Specimen: BLOOD  Result Value Ref Range Status   Specimen Description BLOOD BLOOD LEFT HAND  Final   Special Requests   Final    BOTTLES DRAWN AEROBIC ONLY Blood Culture results may not be optimal due to an inadequate volume of blood received in culture bottles   Culture   Final  NO GROWTH 2 DAYS Performed at [facility], [address] [place], [place] [address]    Report Status PENDING  Incomplete  Surgical PCR screen     Status: Abnormal   Collection Time: 08/26/23  8:08 PM   Specimen: Nasal Mucosa; Nasal Swab  Result Value Ref Range Status   MRSA, PCR NEGATIVE NEGATIVE Final   Staphylococcus aureus POSITIVE (A) NEGATIVE Final    Comment: (NOTE) The Xpert SA Assay (FDA approved for NASAL specimens in patients 50 years of age and older), is one component of a comprehensive surveillance program. It is not intended to diagnose infection nor to guide or monitor treatment. Performed at [facility], [address] [place], [place] [address]   Resp panel by RT-PCR (RSV, Flu A&B, Covid) Anterior Nasal Swab     Status: None   Collection Time: 08/26/23  9:14 PM   Specimen: Anterior Nasal Swab  Result Value Ref Range Status   SARS Coronavirus 2 by RT PCR NEGATIVE NEGATIVE Final    Comment: (NOTE) SARS-CoV-2 target nucleic acids are NOT DETECTED.  The SARS-CoV-2 RNA is generally detectable in upper respiratory specimens during the acute phase of infection. The lowest concentration of SARS-CoV-2 viral copies this assay can  detect is 138 copies/mL. A negative result does not preclude SARS-Cov-2 infection and should not be used as the sole basis for treatment or other patient management decisions. A negative result may occur with  improper specimen collection/handling, submission of specimen other than nasopharyngeal swab, presence of viral mutation(s) within the areas targeted by this assay, and inadequate number of viral copies(<138 copies/mL). A negative result must be combined with clinical observations, patient history, and epidemiological information. The expected result is Negative.  Fact Sheet for Patients:  BloggerCourse.com  Fact Sheet for Healthcare Providers:  SeriousBroker.it  This test is no t yet approved or cleared by the [place] FDA and  has been authorized for detection and/or diagnosis of SARS-CoV-2 by FDA under an Emergency Use Authorization (EUA). This EUA will remain  in effect (meaning this test can be used) for the duration of the COVID-19 declaration under Section 564(b)(1) of the Act, 21 U.S.C.section 360bbb-3(b)(1), unless the authorization is terminated  or revoked sooner.       Influenza A by PCR NEGATIVE NEGATIVE Final   Influenza B by PCR NEGATIVE NEGATIVE Final    Comment: (NOTE) The Xpert Xpress SARS-CoV-2/FLU/RSV plus assay is intended as an aid in the diagnosis of influenza from Nasopharyngeal swab specimens and should not be used as a sole basis for treatment. Nasal washings and aspirates are unacceptable for Xpert Xpress SARS-CoV-2/FLU/RSV testing.  Fact Sheet for Patients: BloggerCourse.com  Fact Sheet for Healthcare Providers: SeriousBroker.it  This test is not yet approved or cleared by the [place] FDA and has been authorized for detection and/or diagnosis of SARS-CoV-2 by FDA under an Emergency Use Authorization (EUA). This EUA will remain in  effect (meaning this test can be used) for the duration of the COVID-19 declaration under Section 564(b)(1) of the Act, 21 U.S.C. section 360bbb-3(b)(1), unless the authorization is terminated or revoked.     Resp Syncytial Virus by PCR NEGATIVE NEGATIVE Final    Comment: (NOTE) Fact Sheet for Patients: BloggerCourse.com  Fact Sheet for Healthcare Providers: SeriousBroker.it  This test is not yet approved or cleared by the [place] FDA and has been authorized for detection and/or diagnosis of SARS-CoV-2 by FDA under an Emergency Use Authorization (EUA). This EUA will remain  in effect (meaning this test can be used) for the duration of the COVID-19 declaration under Section 564(b)(1) of the Act, 21 U.S.C. section 360bbb-3(b)(1), unless the authorization is terminated or revoked.  Performed at [facility], [address] [place], [place] [address]   Respiratory (~20 pathogens) panel by PCR     Status: None   Collection Time: 08/27/23  8:27 AM   Specimen: Nasopharyngeal Swab; Respiratory  Result Value Ref Range Status   Adenovirus NOT DETECTED NOT DETECTED Final   Coronavirus 229E NOT DETECTED NOT DETECTED Final    Comment: (NOTE) The Coronavirus on the Respiratory Panel, DOES NOT test for the novel  Coronavirus (2019 nCoV)    Coronavirus HKU1 NOT DETECTED NOT DETECTED Final   Coronavirus NL63 NOT DETECTED NOT DETECTED Final   Coronavirus OC43 NOT DETECTED NOT DETECTED Final   Metapneumovirus NOT DETECTED NOT DETECTED Final   Rhinovirus / Enterovirus NOT DETECTED NOT DETECTED Final   Influenza A NOT DETECTED NOT DETECTED Final   Influenza B NOT DETECTED NOT DETECTED Final   Parainfluenza Virus 1 NOT DETECTED NOT DETECTED Final   Parainfluenza Virus 2 NOT DETECTED NOT DETECTED Final   Parainfluenza Virus 3 NOT DETECTED NOT DETECTED Final   Parainfluenza Virus 4 NOT DETECTED NOT DETECTED Final   Respiratory  Syncytial Virus NOT DETECTED NOT DETECTED Final   Bordetella pertussis NOT DETECTED NOT DETECTED Final   Bordetella Parapertussis NOT DETECTED NOT DETECTED Final   Chlamydophila pneumoniae NOT DETECTED NOT DETECTED Final   Mycoplasma pneumoniae NOT DETECTED NOT DETECTED Final    Comment: Performed at [facility], [address], [place], [place] [address]         Radiology Studies: US Venous Img Lower Bilateral (DVT) Result Date: 08/27/2023 CLINICAL DATA:  Fever of unknown origin.  Right knee injury. EXAM: BILATERAL LOWER EXTREMITY VENOUS DOPPLER ULTRASOUND TECHNIQUE: Gray-scale sonography with graded compression, as well as color Doppler and duplex ultrasound were performed to evaluate the lower extremity deep venous systems from the level of the common femoral vein and including the common femoral, femoral, profunda femoral, popliteal and calf veins including the posterior tibial, peroneal and gastrocnemius veins when visible. The superficial great saphenous vein was also interrogated. Spectral Doppler was utilized to evaluate flow at rest and with distal augmentation maneuvers in the common femoral, femoral and popliteal veins. COMPARISON:  None Available. FINDINGS: RIGHT LOWER EXTREMITY Common Femoral Vein: No evidence of thrombus. Normal compressibility, respiratory phasicity and response to augmentation. Saphenofemoral Junction: No evidence of thrombus. Normal compressibility and flow on color Doppler imaging. Profunda Femoral Vein: No evidence of thrombus. Normal compressibility and flow on color Doppler imaging. Femoral Vein: No evidence of thrombus. Normal compressibility, respiratory phasicity and response to augmentation. Popliteal Vein: No evidence of thrombus. Normal compressibility, respiratory phasicity and response to augmentation. Calf Veins: No evidence of thrombus. Normal compressibility and flow on color Doppler imaging. Superficial Great Saphenous Vein: No evidence of  thrombus. Normal compressibility. Venous Reflux:  None. Other Findings:  None. LEFT LOWER EXTREMITY Common Femoral Vein: No evidence of thrombus. Normal compressibility, respiratory phasicity and response to augmentation. Saphenofemoral Junction: No evidence of thrombus. Normal compressibility and flow on color Doppler imaging. Profunda Femoral Vein: No evidence of thrombus. Normal compressibility and flow on color Doppler imaging. Femoral Vein: No evidence of thrombus. Normal compressibility, respiratory phasicity and response to augmentation. Popliteal Vein: No evidence of thrombus. Normal compressibility, respiratory phasicity and response to augmentation. Calf Veins: No evidence of thrombus.  Normal compressibility and flow on color Doppler imaging. Superficial Great Saphenous Vein: No evidence of thrombus. Normal compressibility. Venous Reflux:  None. Other Findings:  None. IMPRESSION: No evidence of deep venous thrombosis in either lower extremity. Electronically Signed   By: Lupita Raider M.D.   On: 08/27/2023 16:20   DG C-Arm 1-60 Min-No Report Result Date: 08/27/2023 Fluoroscopy was utilized by the requesting physician.  No radiographic interpretation.   DG C-Arm 1-60 Min-No Report Result Date: 08/27/2023 Fluoroscopy was utilized by the requesting physician.  No radiographic interpretation.   CT Angio Chest Pulmonary Embolism (PE) W or WO Contrast Result Date: 08/27/2023 CLINICAL DATA:  Pulmonary embolism (PE) suspected, high prob. Shortness of breath. Tachycardia. EXAM: CT ANGIOGRAPHY CHEST WITH CONTRAST TECHNIQUE: Multidetector CT imaging of the chest was performed using the standard protocol during bolus administration of intravenous contrast. Multiplanar CT image reconstructions and MIPs were obtained to evaluate the vascular anatomy. RADIATION DOSE REDUCTION: This exam was performed according to the departmental dose-optimization program which includes automated exposure control, adjustment  of the mA and/or kV according to patient size and/or use of iterative reconstruction technique. CONTRAST:  75mL OMNIPAQUE IOHEXOL 350 MG/ML SOLN COMPARISON:  CT scan chest from 08/26/2023. FINDINGS: Cardiovascular: No evidence of embolism to the proximal subsegmental pulmonary artery level. Top normal cardiac size. No pericardial effusion. No aortic aneurysm. There are coronary artery calcifications, in keeping with coronary artery disease. Mediastinum/Nodes: Visualized thyroid gland appears grossly unremarkable. No solid / cystic mediastinal masses. The esophagus is nondistended precluding optimal assessment. No axillary, mediastinal or hilar lymphadenopathy by size criteria. Lungs/Pleura: The central tracheo-bronchial tree is patent. There are patchy areas of linear, plate-like atelectasis and/or scarring throughout bilateral lungs. No mass or consolidation. No pleural effusion or pneumothorax. No suspicious lung nodules. Upper Abdomen: Visualized upper abdominal viscera within normal limits. Musculoskeletal: The visualized soft tissues of the chest wall are grossly unremarkable. No suspicious osseous lesions. There are mild multilevel degenerative changes in the visualized spine. Review of the MIP images confirms the above findings. IMPRESSION: 1. No embolism to the proximal subsegmental pulmonary artery level. 2. No lung mass, consolidation, pleural effusion or pneumothorax. 3. Other nonacute observations, as described above. Electronically Signed   By: Jules Schick M.D.   On: 08/27/2023 10:33   CT CHEST WO CONTRAST Result Date: 08/26/2023 CLINICAL DATA:  Pneumonia, fever EXAM: CT CHEST WITHOUT CONTRAST TECHNIQUE: Multidetector CT imaging of the chest was performed following the standard protocol without IV contrast. RADIATION DOSE REDUCTION: This exam was performed according to the departmental dose-optimization program which includes automated exposure control, adjustment of the mA and/or kV according to  patient size and/or use of iterative reconstruction technique. COMPARISON:  Chest radiograph dated 08/26/2023 FINDINGS: Cardiovascular: The heart is top-normal in size. No pericardial effusion. No evidence of thoracic aortic aneurysm. Mild coronary atherosclerosis of the right coronary artery. Mediastinum/Nodes: No suspicious mediastinal lymphadenopathy. Lungs/Pleura: Evaluation of the lung parenchyma is constrained by respiratory motion. Within that constraint, there are no suspicious pulmonary nodules. Mild linear scarring in the bilateral lung apices. Mild linear scarring at the lateral right lung base. No focal consolidation. No pleural effusion or pneumothorax. Upper Abdomen: Visualized upper abdomen is notable for hepatic steatosis. Musculoskeletal: S shaped thoracolumbar scoliosis with degenerative changes. IMPRESSION: No acute cardiopulmonary disease. Specifically, no findings suspicious for pneumonia. Electronically Signed   By: Charline Bills M.D.   On: 08/26/2023 19:28   DG Chest 1 View Result Date: 08/26/2023 CLINICAL DATA:  Fever. EXAM: CHEST  1 VIEW COMPARISON:  07/10/2015 FINDINGS: Very low lung volumes limit assessment. Stable heart size, upper normal. Streaky opacity at the left lung base. No confluent consolidation. No pleural effusion or pneumothorax. IMPRESSION: Very low lung volumes limit assessment. Streaky opacity at the left lung base may be atelectasis or pneumonia. Electronically Signed   By: Narda Rutherford M.D.   On: 08/26/2023 18:00   DG Knee Right Port Result Date: 08/26/2023 CLINICAL DATA:  Right knee pain. EXAM: PORTABLE RIGHT KNEE - 1-2 VIEW COMPARISON:  Radiographs and CT scan of same day. FINDINGS: Single AP image of right knee demonstrates continued lateral dislocation of patella. IMPRESSION: Continued lateral dislocation of patella. Electronically Signed   By: Lupita Raider M.D.   On: 08/26/2023 14:52        Scheduled Meds:  acetaminophen  1,000 mg Oral Q8H    aspirin EC  325 mg Oral Daily   docusate sodium  100 mg Oral BID   gabapentin  100 mg Oral TID   insulin aspart  0-9 Units Subcutaneous Q4H   insulin aspart  4 Units Subcutaneous TID WC   insulin glargine-yfgn  30 Units Subcutaneous QHS   levothyroxine  50 mcg Oral Q0600   lisinopril  40 mg Oral Daily   metoprolol succinate  25 mg Oral Daily   multivitamin with minerals  1 tablet Oral Daily   mupirocin ointment  1 Application Nasal BID   naproxen  500 mg Oral BID WC   pantoprazole  80 mg Oral Daily   Ensure Max Protein  11 oz Oral BID   Continuous Infusions:     LOS: 1 day     Tresa Moore, MD [facility]   If 7PM-7AM, please contact night-coverage  08/28/2023, 11:24 AM

## 2023-08-28 NOTE — Progress Notes (Signed)
   Subjective: 1 Day Post-Op Procedure(s) (LRB): ARTHROSCOPY KNEE (Right) Patient reports pain as mild.   Patient is well, and has had no acute complaints or problems Denies any CP, SOB, ABD pain. We will continue therapy today.  Plan is to go Home after hospital stay.  Objective: Vital signs in last 24 hours: Temp:  [97.3 F (36.3 C)-99.2 F (37.3 C)] 98.4 F (36.9 C) (02/22 0817) Pulse Rate:  [90-116] 90 (02/22 0817) Resp:  [16-24] 17 (02/22 0817) BP: (88-147)/(58-82) 121/78 (02/22 0817) SpO2:  [91 %-99 %] 99 % (02/22 0817) Weight:  [108.4 kg] 108.4 kg (02/22 0500)  Intake/Output from previous day: 02/21 0701 - 02/22 0700 In: 1575 [P.O.:565; I.V.:900; IV Piggyback:110] Out: 2025 [Urine:2000; Blood:25] Intake/Output this shift: No intake/output data recorded.  Recent Labs    08/26/23 1936  HGB 13.9   Recent Labs    08/26/23 1936  WBC 9.6  RBC 4.50  HCT 42.8  PLT 54*   Recent Labs    08/27/23 0650 08/28/23 0357  NA 139 136  K 4.5 4.4  CL 107 103  CO2 23 23  BUN 24* 23*  CREATININE 0.51 0.42*  GLUCOSE 168* 232*  CALCIUM 8.8* 8.4*   No results for input(s): "LABPT", "INR" in the last 72 hours.  EXAM General - Patient is Alert, Appropriate, and Oriented Extremity - Neurovascular intact Sensation intact distally Intact pulses distally Dorsiflexion/Plantar flexion intact Dressing - dressing C/D/I and no drainage Motor Function - intact, moving foot and toes well on exam.   Past Medical History:  Diagnosis Date   Diabetes mellitus without complication (HCC)     Assessment/Plan:   1 Day Post-Op Procedure(s) (LRB): ARTHROSCOPY KNEE (Right) Principal Problem:   Patellar dislocation Active Problems:   Morbid obesity (HCC)   Diabetes mellitus with neuropathy (HCC)   Type 2 diabetes mellitus without complication, with long-term current use of insulin (HCC)   Hypoxia  Estimated body mass index is 43.71 kg/m as calculated from the following:    Height as of this encounter: 5\' 2"  (1.575 m).   Weight as of this encounter: 108.4 kg. Advance diet Up with therapy Pain well controlled Vital signs are stable Labs are stable CM to assist with discharge to home today pending safe completion of PT goals   Patient will need 2-week follow-up with [facility] orthopedics for suture removal Aspirin 325 mg daily for 2 weeks for DVT prophylaxis Weightbearing as tolerated with a knee immobilizer. Knee immobilizer may be removed while patient is at rest and patient may work on gentle knee range of motion exercises. Patient will keep Ace wrap and bulky dressing on until her 2-week postop appointment for suture removal    DVT Prophylaxis - Aspirin Weight-Bearing as tolerated to right leg   T. Cranston Neighbor, PA-C [facility] 08/28/2023, 8:24 AM

## 2023-08-28 NOTE — Evaluation (Signed)
 Physical Therapy Evaluation Patient Details Name: Linda Mason MRN: [id number] DOB: 10/06/1995 Today's Date: 08/28/2023  History of Present Illness  Pt admitted to [facility] on 08/26/23 for c/o mechanical fall OOB that resulted in R patellar dislocation. S/p R knee arthroscopy with assisted patella reduction and lateral release. Significant PMH includes: Prader-Willi syndrome, intellectual delay, and diabetes.   Clinical Impression  Pt is a 28 year old F admitted to hospital on 08/26/23 for patellar dislocation s/p surgical fixation. At baseline, pt is normally IND with ADL's, IADL's, ambulation without AD, and medication management.   Pt presents with RLE weakness, increased pain levels, obesity, decreased gross balance, decreased activity tolerance, gait deficits, and baseline cognitive deficits, resulting in impaired functional mobility from baseline. Due to deficits, pt required max assist +2 for bed mobility, min assist +2 for transfers, and min assist +2 to take multiple small shuffled steps at bedside.   Further participation with therapy deferred at this time secondary to increasing pain levels and fatigue. Increased time/effort required during session for pericare and linen change after incontinent episode. KI difficult to don this date secondary to length of brace as pt has shorter legs; this has been addressed with care team. Able to wean pt from 5L O2 to 1L O2 during session, with SpO2 >/= 90% upon PT/OT exit.  Deficits limit the pt's ability to safely and independently perform ADL's, transfer, and ambulate. Pt will benefit from acute skilled PT services to address deficits for return to baseline function. At this time, PT recommends high intensity post acute therapy services to address deficits for return to baseline function. Pt/family agreeable.         If plan is discharge home, recommend the following: A lot of help with walking and/or transfers;A lot of help with  bathing/dressing/bathroom;Assistance with cooking/housework;Direct supervision/assist for medications management;Assist for transportation;Help with stairs or ramp for entrance;Supervision due to cognitive status     Equipment Recommendations  (defer to post acute)     Functional Status Assessment Patient has had a recent decline in their functional status and demonstrates the ability to make significant improvements in function in a reasonable and predictable amount of time.     Precautions / Restrictions Precautions Precautions: Knee Precaution Booklet Issued: No Required Braces or Orthoses: Knee Immobilizer - Right Knee Immobilizer - Right: On when out of bed or walking (except when resting (supine) or with bathing) Restrictions Weight Bearing Restrictions Per Provider Order: Yes RLE Weight Bearing Per Provider Order: Weight bearing as tolerated      Mobility  Bed Mobility Overal bed mobility: Needs Assistance Bed Mobility: Supine to Sit, Sit to Supine     Supine to sit: Max assist, +2 for physical assistance Sit to supine: Max assist, +2 for physical assistance   General bed mobility comments: +2 assist for trunk and BLE facilitation to perform supine<>sit transfers; max multimodal cues for safety, sequencing, and hand placement; increased time/effort secondary to increased pain levels    Transfers Overall transfer level: Needs assistance Equipment used: Rolling walker (2 wheels) Transfers: Sit to/from Stand Sit to Stand: Min assist, +2 physical assistance           General transfer comment: +2 for power to stand from EOB with RW; multimodal cues for safety, sequencing, and hip extension for full upright standing. Demonstrates "poor" eccentric lowering without proper UE placement.    Ambulation/Gait Ambulation/Gait assistance: Min assist, +2 physical assistance Gait Distance (Feet): 2 Feet  General Gait Details: Takes multiple small shuffled steps at  bedside with +2 assist for RW management and balance. Demonstrates wide BOS, decreased R foot clearance and step length secondary to pain and KI. Increased assist for RW management with cues for sequencing.      Balance Overall balance assessment: Needs assistance Sitting-balance support: No upper extremity supported, Feet supported Sitting balance-Leahy Scale: Good Sitting balance - Comments: able to perform posterior lean to assist with donning KI in sitting with controlled proximal stability   Standing balance support: Bilateral upper extremity supported, During functional activity, Reliant on assistive device for balance Standing balance-Leahy Scale: Poor Standing balance comment: min assist +2 for standing balance in RW                             Pertinent Vitals/Pain Pain Assessment Pain Assessment: Faces Faces Pain Scale: Hurts whole lot Pain Location: R knee, increasing pain behaviors with mobility/touch Pain Intervention(s): Limited activity within patient's tolerance, Monitored during session, Patient requesting pain meds-RN notified, Relaxation, Ice applied    Home Living Family/patient expects to be discharged to:: Private residence Living Arrangements: Parent Available Help at Discharge: Family Type of Home: House Home Access: Stairs to enter   Secretary/administrator of Steps: 5   Home Layout: One level        Prior Function               Mobility Comments: Independent, no device. Denies additional falls. ADLs Comments: Independent per pt/father. Family is available to provide 24/7     Extremity/Trunk Assessment   Upper Extremity Assessment Upper Extremity Assessment: Defer to OT evaluation    Lower Extremity Assessment Lower Extremity Assessment: RLE deficits/detail;LLE deficits/detail RLE Deficits / Details: not formally assessed due to increased pain levels; observed to be at least 3+/5 as pt is able to WB through LE for transfers and  gait without buckling LLE Deficits / Details: not formally assessed due to increased RLE pain and perseveration; observed to be at least 3+/5 as pt is able to WB through LE for transfers and gait without buckling       Communication   Communication Communication: No apparent difficulties Adult nurse (Spanish interpreter) present during session)    Cognition Arousal: Alert Behavior During Therapy: WFL for tasks assessed/performed   PT - Cognitive impairments: History of cognitive impairments, Attention                       PT - Cognition Comments: Prader-Willi Syndrome Following commands: Intact (easily distracted, but able to redirect with multimodal cues)       Cueing Cueing Techniques: Verbal cues     General Comments General comments (skin integrity, edema, etc.): 100% on 5L upon entry; able to wean to 1L with SpO2 >/= 90%. Elevated HR in 90's. KI donned in sitting secondary to incontinence in supine and needing pericare/linen change. Increased difficulty donning KI secondary to length of brace comparative to length of pt's leg.    Exercises Other Exercises Other Exercises: Pt and father educated re: PT role/POC, KI management and wear time, ice machine, RLE WBAT, benefits of OOB mobility, call for help, DC recommendations. They verbalized understanding.   Assessment/Plan    PT Assessment Patient needs continued PT services  PT Problem List Decreased strength;Decreased range of motion;Decreased activity tolerance;Decreased balance;Decreased mobility;Decreased cognition;Decreased safety awareness;Decreased knowledge of precautions;Obesity;Pain       PT Treatment Interventions DME  instruction;Gait training;Stair training;Functional mobility training;Therapeutic activities;Therapeutic exercise;Balance training;Neuromuscular re-education;Patient/family education;Wheelchair mobility training    PT Goals (Current goals can be found in the Care Plan section)  Acute Rehab PT  Goals Patient Stated Goal: "CIR" PT Goal Formulation: With patient/family Time For Goal Achievement: 09/11/23 Potential to Achieve Goals: Good    Frequency Min 1X/week     Co-evaluation PT/OT/SLP Co-Evaluation/Treatment: Yes Reason for Co-Treatment: Complexity of the patient's impairments (multi-system involvement);For patient/therapist safety;To address functional/ADL transfers PT goals addressed during session: Mobility/safety with mobility;Balance         AM-PAC PT "6 Clicks" Mobility  Outcome Measure Help needed turning from your back to your side while in a flat bed without using bedrails?: A Lot Help needed moving from lying on your back to sitting on the side of a flat bed without using bedrails?: Total Help needed moving to and from a bed to a chair (including a wheelchair)?: A Little Help needed standing up from a chair using your arms (e.g., wheelchair or bedside chair)?: A Little Help needed to walk in hospital room?: A Little Help needed climbing 3-5 steps with a railing? : Total 6 Click Score: 13    End of Session Equipment Utilized During Treatment: Right knee immobilizer;Gait belt Activity Tolerance: Patient limited by pain Patient left: in bed;with call bell/phone within reach;with bed alarm set;with family/visitor present Nurse Communication: Mobility status;Patient requests pain meds;Weight bearing status (ill-fitting KI) PT Visit Diagnosis: Unsteadiness on feet (R26.81);History of falling (Z91.81);Muscle weakness (generalized) (M62.81);Pain;Difficulty in walking, not elsewhere classified (R26.2) Pain - Right/Left: Right Pain - part of body: Knee    Time: 0929-1012 PT Time Calculation (min) (ACUTE ONLY): 43 min   Charges:   PT Evaluation $PT Eval Low Complexity: 1 Low PT Treatments $Therapeutic Activity: 8-22 mins PT General Charges $$ ACUTE PT VISIT: 1 Visit        Vira Blanco, PT, DPT 11:39 AM,08/28/23 Physical Therapist - [facility]

## 2023-08-29 DIAGNOSIS — S83004D Unspecified dislocation of right patella, subsequent encounter: Secondary | ICD-10-CM | POA: Diagnosis not present

## 2023-08-29 LAB — GLUCOSE, CAPILLARY
Glucose-Capillary: 213 mg/dL — ABNORMAL HIGH (ref 70–99)
Glucose-Capillary: 229 mg/dL — ABNORMAL HIGH (ref 70–99)
Glucose-Capillary: 279 mg/dL — ABNORMAL HIGH (ref 70–99)
Glucose-Capillary: 333 mg/dL — ABNORMAL HIGH (ref 70–99)
Glucose-Capillary: 356 mg/dL — ABNORMAL HIGH (ref 70–99)

## 2023-08-29 NOTE — Progress Notes (Signed)
 [facility] IP rehab screen - Patient screened per therapy recommendations.  Patient may be a potential candidate for acute inpatient rehab admission.  I will place a rehab consult order per protocol and a partner will follow up on Monday.  [phone]

## 2023-08-29 NOTE — TOC Progression Note (Signed)
 Transition of Care [facility]) - Progression Note    Patient Details  Name: Linda Mason MRN: [id number] Date of Birth: 1996/02/19  Transition of Care [facility]) CM/SW Contact  Bing Quarry, RN Phone Number: 08/29/2023, 9:53 AM  Clinical Narrative:  2/22: Current recommendation from PT is for acute inpatient rehab (AIR).  Was seen this am by consultation and per note: "Patient may be a potential candidate for acute inpatient rehab admission. I will place a rehab consult order per protocol and a partner will follow up on Monday. [phone]". Provider updated.    Gabriel Cirri MSN RN CM  RN Case Manager [place]  Transitions of Care Direct Dial: [phone] (Weekends Only) [facility] Main Office Phone: [phone] [facility] Fax: [phone] [email or web address]          Expected Discharge Plan and Services                                               Social Determinants of Health (SDOH) Interventions SDOH Screenings   Food Insecurity: No Food Insecurity (08/26/2023)  Housing: Low Risk  (08/27/2023)  Transportation Needs: No Transportation Needs (08/27/2023)  Financial Resource Strain: Unknown (04/29/2017)   Received from [facility]  Physical Activity: Unknown (04/29/2017)   Received from [facility]  Social Connections: Unknown (04/29/2017)   Received from [facility]  Stress: Unknown (04/29/2017)   Received from [facility]  Tobacco Use: Unknown (08/26/2023)    Readmission Risk Interventions     No data to display

## 2023-08-29 NOTE — Progress Notes (Signed)
   Subjective: 2 Days Post-Op Procedure(s) (LRB): ARTHROSCOPY KNEE (Right) Patient reports pain as mild. Slow progress with PT  Patient is well, and has had no acute complaints or problems Denies any CP, SOB, ABD pain. We will continue therapy today.  Plan is to go Rehab after hospital stay.  Objective: Vital signs in last 24 hours: Temp:  [97.8 F (36.6 C)-98.1 F (36.7 C)] 97.8 F (36.6 C) (02/23 0741) Pulse Rate:  [80-99] 99 (02/23 0741) Resp:  [16-17] 16 (02/23 0741) BP: (118-122)/(69-73) 118/73 (02/23 0741) SpO2:  [96 %-98 %] 97 % (02/23 0741)  Intake/Output from previous day: 02/22 0701 - 02/23 0700 In: 440 [P.O.:440] Out: 800 [Urine:800] Intake/Output this shift: No intake/output data recorded.  Recent Labs    08/26/23 1936  HGB 13.9   Recent Labs    08/26/23 1936  WBC 9.6  RBC 4.50  HCT 42.8  PLT 54*   Recent Labs    08/27/23 0650 08/28/23 0357  NA 139 136  K 4.5 4.4  CL 107 103  CO2 23 23  BUN 24* 23*  CREATININE 0.51 0.42*  GLUCOSE 168* 232*  CALCIUM 8.8* 8.4*   No results for input(s): "LABPT", "INR" in the last 72 hours.  EXAM General - Patient is Alert, Appropriate, and Oriented Extremity - Neurovascular intact Sensation intact distally Intact pulses distally Dorsiflexion/Plantar flexion intact Ace wrap intact Dressing - dressing C/D/I and no drainage Motor Function - intact, moving foot and toes well on exam.   Past Medical History:  Diagnosis Date   Diabetes mellitus without complication (HCC)     Assessment/Plan:   2 Days Post-Op Procedure(s) (LRB): ARTHROSCOPY KNEE (Right) Principal Problem:   Patellar dislocation Active Problems:   Morbid obesity (HCC)   Diabetes mellitus with neuropathy (HCC)   Type 2 diabetes mellitus without complication, with long-term current use of insulin (HCC)   Hypoxia  Estimated body mass index is 43.71 kg/m as calculated from the following:   Height as of this encounter: 5\' 2"  (1.575  m).   Weight as of this encounter: 108.4 kg. Advance diet Up with therapy, slow progress with PT. PT recommending AIR.  Pain well controlled Vital signs are stable Labs are stable CM to assist with discharge to acute inpatient rehab   Patient will need 2-week follow-up with [facility] orthopedics for suture removal Aspirin 325 mg daily for 2 weeks for DVT prophylaxis Weightbearing as tolerated with a knee immobilizer. Knee immobilizer may be removed while patient is at rest and patient may work on gentle knee range of motion exercises. Patient will keep Ace wrap and bulky dressing on until her 2-week postop appointment for suture removal    DVT Prophylaxis - Aspirin Weight-Bearing as tolerated to right leg   T. Cranston Neighbor, PA-C [facility] 08/29/2023, 11:53 AM

## 2023-08-29 NOTE — Plan of Care (Signed)

## 2023-08-29 NOTE — Progress Notes (Signed)
 Physical Therapy Treatment Patient Details Name: Davonna Ertl MRN: [id number] DOB: 31-Aug-1995 Today's Date: 08/29/2023   History of Present Illness Pt admitted to [facility] on 08/26/23 for c/o mechanical fall OOB that resulted in R patellar dislocation. S/p R knee arthroscopy with assisted patella reduction and lateral release. Significant PMH includes: Prader-Willi syndrome, intellectual delay, and diabetes.    PT Comments  Contacted by RN due to need to use BSC, pt crying, and staff having concerns over her ability to transfer.  Arrived in room where KI is donned but pt keeping LE externally rotated with knee flexed unable to straighten fully.  KI donned as best as I could.  She is assisted to sitting with mod a x 1 with bed features and good effort by pt.  Steady in sitting.  She attempts to stand to RW but struggles today and due to urgency of BM and generally being upset, stedy lift is used.  She stands fair from bed with +2 assist to left and is transferred to [facility] for BM/void and bathing/linen change.  She does struggle to get up for [facility] due to lower height and +3 is needed to complete stand to transfer with stedy back to bed.  +2 to return to supine.  Pt is appreciative of assist.  Spanish speaking but does understand basic cues/communication needs for session today.     If plan is discharge home, recommend the following: Assistance with cooking/housework;Direct supervision/assist for medications management;Assist for transportation;Help with stairs or ramp for entrance;Supervision due to cognitive status;Two people to help with bathing/dressing/bathroom;Two people to help with walking and/or transfers   Can travel by private vehicle        Equipment Recommendations       Recommendations for Other Services       Precautions / Restrictions Precautions Precautions: Knee Precaution Booklet Issued: No Recall of Precautions/Restrictions: Impaired Required Braces or Orthoses: Knee  Immobilizer - Right Knee Immobilizer - Right: On when out of bed or walking (except when resting (supine) or with bathing) Restrictions Weight Bearing Restrictions Per Provider Order: Yes RLE Weight Bearing Per Provider Order: Weight bearing as tolerated     Mobility  Bed Mobility Overal bed mobility: Needs Assistance Bed Mobility: Supine to Sit, Sit to Supine     Supine to sit: Max assist, +2 for physical assistance Sit to supine: Max assist, +2 for physical assistance        Transfers Overall transfer level: Needs assistance   Transfers: Bed to chair/wheelchair/BSC Sit to Stand: Via lift equipment           General transfer comment: unable to stand safely with walker to transfer to [facility], stedy is used during session Transfer via Lift Equipment: Stedy  Ambulation/Gait                   Stairs             Wheelchair Mobility     Tilt Bed    Modified Rankin (Stroke Patients Only)       Balance   Sitting-balance support: No upper extremity supported, Feet supported Sitting balance-Leahy Scale: Good     Standing balance support: Bilateral upper extremity supported, During functional activity, Reliant on assistive device for balance Standing balance-Leahy Scale: Poor Standing balance comment: struggled today but may have been urgency for BM affecting mobility  Communication    Cognition Arousal: Alert Behavior During Therapy: WFL for tasks assessed/performed   PT - Cognitive impairments: History of cognitive impairments, Attention                       PT - Cognition Comments: spanish speaking but does understand basic english        Cueing    Exercises      General Comments        Pertinent Vitals/Pain Pain Assessment Pain Assessment: Faces Faces Pain Scale: Hurts whole lot Pain Location: R knee and abdomen needing to have BM Pain Descriptors / Indicators: Grimacing,  Guarding Pain Intervention(s): Limited activity within patient's tolerance, Repositioned    Home Living                          Prior Function            PT Goals (current goals can now be found in the care plan section) Progress towards PT goals: Progressing toward goals    Frequency    Min 1X/week      PT Plan      Co-evaluation              AM-PAC PT "6 Clicks" Mobility   Outcome Measure  Help needed turning from your back to your side while in a flat bed without using bedrails?: A Lot Help needed moving from lying on your back to sitting on the side of a flat bed without using bedrails?: Total Help needed moving to and from a bed to a chair (including a wheelchair)?: Total Help needed standing up from a chair using your arms (e.g., wheelchair or bedside chair)?: Total Help needed to walk in hospital room?: Total Help needed climbing 3-5 steps with a railing? : Total 6 Click Score: 7    End of Session Equipment Utilized During Treatment: Right knee immobilizer;Gait belt   Patient left: in bed;with call bell/phone within reach;with bed alarm set;with family/visitor present Nurse Communication: Mobility status;Patient requests pain meds;Weight bearing status PT Visit Diagnosis: Unsteadiness on feet (R26.81);History of falling (Z91.81);Muscle weakness (generalized) (M62.81);Pain;Difficulty in walking, not elsewhere classified (R26.2) Pain - Right/Left: Right Pain - part of body: Knee     Time: 4098-1191 PT Time Calculation (min) (ACUTE ONLY): 35 min  Charges:    $Therapeutic Activity: 23-37 mins PT General Charges $$ ACUTE PT VISIT: 1 Visit                    Danielle Dess, PTA 08/29/23, 2:38 PM

## 2023-08-29 NOTE — Progress Notes (Signed)
 PROGRESS NOTE    Jenalee Trevizo [facility]  ZOX:[id number] DOB: [birth year] DOA: 08/26/2023 PCP: [facility]    Brief Narrative:   28 y.o. female with past medical history of Prader-Willi, syndrome, IDDM with insulin resistance, thyroidism, morbid obesity presented to ED with mechanical fall and acute right knee pain.  Medical team is consulted for diabetes control.  Noticed that the patient has been having poorly controlled diabetes with A1c consistently above 10 for the last 2 checks in 6 months.  At baseline patient uses Lantus 15 units daily and NovoLog 3 units 3 times daily AC plus metformin and Jardiance.   Patient also having breakthrough fevers.  Unclear etiology.  No white count, mild elevation in procalcitonin.  Infectious workup unrevealing thus far.  No PE.  BL LE [place] completed and pending.  No pneumonia, no UTI.  Given the circumstance, patient is as medically optimized as possible and low to moderate risk for complications.  Recs discussed with orthopedic surgery Dr Allena Katz  2/22: Patient underwent successful knee arthroscopy with orthopedics on 2/21.  Tolerated procedure well.  Postoperative pain moderate.  2/23: CIR waiting for admission  Assessment & Plan:   Principal Problem:   Patellar dislocation Active Problems:   Diabetes mellitus with neuropathy (HCC)   Morbid obesity (HCC)   Type 2 diabetes mellitus without complication, with long-term current use of insulin (HCC)   Hypoxia   IDDM with insulin resistance, poor control -Most recent A1c 11.1 in December 2024 Plan: Basal bolus regimen Mealtime insulin Continue carb modified diet   Fever Unclear etiology.  Differential is broad.  No white count elevation, mild elevation in procalcitonin.  Workup unrevealing.  Neg for flu/covid/rsv.  No pneumonia on imaging, no PE, no UTI.  No DVT.  Fevers may be secondary to pain.  Fevers have resolved since surgery Plan: No antibiotics.  Monitor vitals and  fever curve.  Morbid obesity Question of sleep apnea BMI 51.2.  Complicating factor in overall care and prognosis  Patellar dislocation status post knee arthroscopy Tolerated procedure well.  Knee immobilizer in place.  Aspirin 325 daily x 2 weeks Pain control, PT OT, outpatient follow-up with orthopedics in 2 weeks Per therapy will need CIR. Admissions coordinator aware     DVT prophylaxis: SQ lovenox Code Status: FULL Family Communication:Mother at bedside 2/21, father at bedside 2/22, 2/23 Disposition Plan: Status is: Inpatient Remains inpatient appropriate because: Knee dislocation.  Fever of unknown etiology   Level of care: Med-Surg  Consultants:  Orthpedics  Procedures:  None  Antimicrobials:     Subjective: Seen and examined.  Feels well overall.  Continues to endorse pain in the right knee.  No fevers over interval.  Objective: Vitals:   08/28/23 0817 08/28/23 1619 08/28/23 2333 08/29/23 0741  BP: 121/78 122/69 118/73 118/73  Pulse: 90 80 86 99  Resp: 17 17 17 16   Temp: 98.4 F (36.9 C) 98.1 F (36.7 C) 98.1 F (36.7 C) 97.8 F (36.6 C)  TempSrc:  Oral    SpO2: 99% 96% 98% 97%  Weight:      Height:        Intake/Output Summary (Last 24 hours) at 08/29/2023 1137 Last data filed at 08/28/2023 2120 Gross per 24 hour  Intake 440 ml  Output 800 ml  Net -360 ml   Filed Weights   08/26/23 0343 08/28/23 0500  Weight: 127 kg 108.4 kg    Examination:  General exam: NAD Respiratory system: Lungs clear.  Normal  work of breathing.  Room air Cardiovascular system: S1S2, RRR, no murmur Gastrointestinal system: Obese, soft, NT/ND, normal bowel sounds Central nervous system: Alert and oriented. No focal neurological deficits. Extremities: Right knee immobilizer in place.  Decreased ROM.  Gait not assessed Skin: No rashes, lesions or ulcers Psychiatry: Judgement and insight appear normal. Mood & affect appropriate.     Data Reviewed: I have  personally reviewed following labs and imaging studies  CBC: Recent Labs  Lab 08/26/23 1936  WBC 9.6  HGB 13.9  HCT 42.8  MCV 95.1  PLT 54*   Basic Metabolic Panel: Recent Labs  Lab 08/26/23 1936 08/26/23 1942 08/27/23 0650 08/28/23 0357  NA 135  --  139 136  K 4.5  --  4.5 4.4  CL 102  --  107 103  CO2 21*  --  23 23  GLUCOSE 153*  --  168* 232*  BUN 17  --  24* 23*  CREATININE 0.37*  --  0.51 0.42*  CALCIUM 8.9  --  8.8* 8.4*  MG  --  1.9  --  1.9  PHOS  --   --   --  3.1   GFR: Estimated Creatinine Clearance: 121.3 mL/min (A) (by C-G formula based on SCr of 0.42 mg/dL (L)). Liver Function Tests: Recent Labs  Lab 08/26/23 1936  AST 57*  ALT 74*  ALKPHOS 123  BILITOT 2.4*  PROT 6.9  ALBUMIN 3.3*   No results for input(s): "LIPASE", "AMYLASE" in the last 168 hours. No results for input(s): "AMMONIA" in the last 168 hours. Coagulation Profile: No results for input(s): "INR", "PROTIME" in the last 168 hours. Cardiac Enzymes: No results for input(s): "CKTOTAL", "CKMB", "CKMBINDEX", "TROPONINI" in the last 168 hours. BNP (last 3 results) No results for input(s): "PROBNP" in the last 8760 hours. HbA1C: Recent Labs    08/26/23 1936  HGBA1C 9.5*   CBG: Recent Labs  Lab 08/28/23 1722 08/28/23 2026 08/28/23 2328 08/29/23 0405 08/29/23 0745  GLUCAP 327* 257* 245* 213* 229*   Lipid Profile: No results for input(s): "CHOL", "HDL", "LDLCALC", "TRIG", "CHOLHDL", "LDLDIRECT" in the last 72 hours. Thyroid Function Tests: No results for input(s): "TSH", "T4TOTAL", "FREET4", "T3FREE", "THYROIDAB" in the last 72 hours. Anemia Panel: No results for input(s): "VITAMINB12", "FOLATE", "FERRITIN", "TIBC", "IRON", "RETICCTPCT" in the last 72 hours. Sepsis Labs: Recent Labs  Lab 08/26/23 1936 08/26/23 1942 08/26/23 2151  PROCALCITON  --  0.38  --   LATICACIDVEN 1.4  --  1.3    Recent Results (from the past 240 hours)  Culture, blood (Routine X 2) w Reflex to  ID Panel     Status: None (Preliminary result)   Collection Time: 08/26/23  7:38 PM   Specimen: BLOOD  Result Value Ref Range Status   Specimen Description BLOOD BLOOD LEFT ARM  Final   Special Requests   Final    BOTTLES DRAWN AEROBIC AND ANAEROBIC Blood Culture adequate volume   Culture   Final    NO GROWTH 3 DAYS Performed at [facility], [address], [place], [place] [address]    Report Status PENDING  Incomplete  Culture, blood (Routine X 2) w Reflex to ID Panel     Status: None (Preliminary result)   Collection Time: 08/26/23  7:42 PM   Specimen: BLOOD  Result Value Ref Range Status   Specimen Description BLOOD BLOOD LEFT HAND  Final   Special Requests   Final    BOTTLES DRAWN AEROBIC ONLY Blood  Culture results may not be optimal due to an inadequate volume of blood received in culture bottles   Culture   Final    NO GROWTH 3 DAYS Performed at [facility], [address] [place], [place] [address]    Report Status PENDING  Incomplete  Surgical PCR screen     Status: Abnormal   Collection Time: 08/26/23  8:08 PM   Specimen: Nasal Mucosa; Nasal Swab  Result Value Ref Range Status   MRSA, PCR NEGATIVE NEGATIVE Final   Staphylococcus aureus POSITIVE (A) NEGATIVE Final    Comment: (NOTE) The Xpert SA Assay (FDA approved for NASAL specimens in patients 64 years of age and older), is one component of a comprehensive surveillance program. It is not intended to diagnose infection nor to guide or monitor treatment. Performed at [facility], [address] [place], [place] [address]   Resp panel by RT-PCR (RSV, Flu A&B, Covid) Anterior Nasal Swab     Status: None   Collection Time: 08/26/23  9:14 PM   Specimen: Anterior Nasal Swab  Result Value Ref Range Status   SARS Coronavirus 2 by RT PCR NEGATIVE NEGATIVE Final    Comment: (NOTE) SARS-CoV-2 target nucleic acids are NOT DETECTED.  The SARS-CoV-2 RNA is generally detectable in upper  respiratory specimens during the acute phase of infection. The lowest concentration of SARS-CoV-2 viral copies this assay can detect is 138 copies/mL. A negative result does not preclude SARS-Cov-2 infection and should not be used as the sole basis for treatment or other patient management decisions. A negative result may occur with  improper specimen collection/handling, submission of specimen other than nasopharyngeal swab, presence of viral mutation(s) within the areas targeted by this assay, and inadequate number of viral copies(<138 copies/mL). A negative result must be combined with clinical observations, patient history, and epidemiological information. The expected result is Negative.  Fact Sheet for Patients:  BloggerCourse.com  Fact Sheet for Healthcare Providers:  SeriousBroker.it  This test is no t yet approved or cleared by the [place] FDA and  has been authorized for detection and/or diagnosis of SARS-CoV-2 by FDA under an Emergency Use Authorization (EUA). This EUA will remain  in effect (meaning this test can be used) for the duration of the COVID-19 declaration under Section 564(b)(1) of the Act, 21 U.S.C.section 360bbb-3(b)(1), unless the authorization is terminated  or revoked sooner.       Influenza A by PCR NEGATIVE NEGATIVE Final   Influenza B by PCR NEGATIVE NEGATIVE Final    Comment: (NOTE) The Xpert Xpress SARS-CoV-2/FLU/RSV plus assay is intended as an aid in the diagnosis of influenza from Nasopharyngeal swab specimens and should not be used as a sole basis for treatment. Nasal washings and aspirates are unacceptable for Xpert Xpress SARS-CoV-2/FLU/RSV testing.  Fact Sheet for Patients: BloggerCourse.com  Fact Sheet for Healthcare Providers: SeriousBroker.it  This test is not yet approved or cleared by the [place] FDA and has been  authorized for detection and/or diagnosis of SARS-CoV-2 by FDA under an Emergency Use Authorization (EUA). This EUA will remain in effect (meaning this test can be used) for the duration of the COVID-19 declaration under Section 564(b)(1) of the Act, 21 U.S.C. section 360bbb-3(b)(1), unless the authorization is terminated or revoked.     Resp Syncytial Virus by PCR NEGATIVE NEGATIVE Final    Comment: (NOTE) Fact Sheet for Patients: BloggerCourse.com  Fact Sheet for Healthcare Providers: SeriousBroker.it  This test is not yet approved or cleared by  the [employer] and has been authorized for detection and/or diagnosis of SARS-CoV-2 by FDA under an Emergency Use Authorization (EUA). This EUA will remain in effect (meaning this test can be used) for the duration of the COVID-19 declaration under Section 564(b)(1) of the Act, 21 U.S.C. section 360bbb-3(b)(1), unless the authorization is terminated or revoked.  Performed at [facility], [address] [place], [place] [address]   Respiratory (~20 pathogens) panel by PCR     Status: None   Collection Time: 08/27/23  8:27 AM   Specimen: Nasopharyngeal Swab; Respiratory  Result Value Ref Range Status   Adenovirus NOT DETECTED NOT DETECTED Final   Coronavirus 229E NOT DETECTED NOT DETECTED Final    Comment: (NOTE) The Coronavirus on the Respiratory Panel, DOES NOT test for the novel  Coronavirus (2019 nCoV)    Coronavirus HKU1 NOT DETECTED NOT DETECTED Final   Coronavirus NL63 NOT DETECTED NOT DETECTED Final   Coronavirus OC43 NOT DETECTED NOT DETECTED Final   Metapneumovirus NOT DETECTED NOT DETECTED Final   Rhinovirus / Enterovirus NOT DETECTED NOT DETECTED Final   Influenza A NOT DETECTED NOT DETECTED Final   Influenza B NOT DETECTED NOT DETECTED Final   Parainfluenza Virus 1 NOT DETECTED NOT DETECTED Final   Parainfluenza Virus 2 NOT DETECTED NOT DETECTED Final    Parainfluenza Virus 3 NOT DETECTED NOT DETECTED Final   Parainfluenza Virus 4 NOT DETECTED NOT DETECTED Final   Respiratory Syncytial Virus NOT DETECTED NOT DETECTED Final   Bordetella pertussis NOT DETECTED NOT DETECTED Final   Bordetella Parapertussis NOT DETECTED NOT DETECTED Final   Chlamydophila pneumoniae NOT DETECTED NOT DETECTED Final   Mycoplasma pneumoniae NOT DETECTED NOT DETECTED Final    Comment: Performed at [facility], [address], [place], [place] [address]         Radiology Studies: US Venous Img Lower Bilateral (DVT) Result Date: 08/27/2023 CLINICAL DATA:  Fever of unknown origin.  Right knee injury. EXAM: BILATERAL LOWER EXTREMITY VENOUS DOPPLER ULTRASOUND TECHNIQUE: Gray-scale sonography with graded compression, as well as color Doppler and duplex ultrasound were performed to evaluate the lower extremity deep venous systems from the level of the common femoral vein and including the common femoral, femoral, profunda femoral, popliteal and calf veins including the posterior tibial, peroneal and gastrocnemius veins when visible. The superficial great saphenous vein was also interrogated. Spectral Doppler was utilized to evaluate flow at rest and with distal augmentation maneuvers in the common femoral, femoral and popliteal veins. COMPARISON:  None Available. FINDINGS: RIGHT LOWER EXTREMITY Common Femoral Vein: No evidence of thrombus. Normal compressibility, respiratory phasicity and response to augmentation. Saphenofemoral Junction: No evidence of thrombus. Normal compressibility and flow on color Doppler imaging. Profunda Femoral Vein: No evidence of thrombus. Normal compressibility and flow on color Doppler imaging. Femoral Vein: No evidence of thrombus. Normal compressibility, respiratory phasicity and response to augmentation. Popliteal Vein: No evidence of thrombus. Normal compressibility, respiratory phasicity and response to augmentation. Calf Veins: No  evidence of thrombus. Normal compressibility and flow on color Doppler imaging. Superficial Great Saphenous Vein: No evidence of thrombus. Normal compressibility. Venous Reflux:  None. Other Findings:  None. LEFT LOWER EXTREMITY Common Femoral Vein: No evidence of thrombus. Normal compressibility, respiratory phasicity and response to augmentation. Saphenofemoral Junction: No evidence of thrombus. Normal compressibility and flow on color Doppler imaging. Profunda Femoral Vein: No evidence of thrombus. Normal compressibility and flow on color Doppler imaging. Femoral Vein: No evidence of thrombus. Normal compressibility,  respiratory phasicity and response to augmentation. Popliteal Vein: No evidence of thrombus. Normal compressibility, respiratory phasicity and response to augmentation. Calf Veins: No evidence of thrombus. Normal compressibility and flow on color Doppler imaging. Superficial Great Saphenous Vein: No evidence of thrombus. Normal compressibility. Venous Reflux:  None. Other Findings:  None. IMPRESSION: No evidence of deep venous thrombosis in either lower extremity. Electronically Signed   By: Lupita Raider M.D.   On: 08/27/2023 16:20   DG C-Arm 1-60 Min-No Report Result Date: 08/27/2023 Fluoroscopy was utilized by the requesting physician.  No radiographic interpretation.   DG C-Arm 1-60 Min-No Report Result Date: 08/27/2023 Fluoroscopy was utilized by the requesting physician.  No radiographic interpretation.        Scheduled Meds:  acetaminophen  1,000 mg Oral Q8H   aspirin EC  325 mg Oral Daily   docusate sodium  100 mg Oral BID   gabapentin  100 mg Oral TID   insulin aspart  0-9 Units Subcutaneous Q4H   insulin aspart  4 Units Subcutaneous TID WC   insulin glargine-yfgn  30 Units Subcutaneous QHS   levothyroxine  50 mcg Oral Q0600   lisinopril  40 mg Oral Daily   metoprolol succinate  25 mg Oral Daily   multivitamin with minerals  1 tablet Oral Daily   mupirocin ointment   1 Application Nasal BID   naproxen  500 mg Oral BID WC   pantoprazole  80 mg Oral Daily   Ensure Max Protein  11 oz Oral BID   Continuous Infusions:     LOS: 2 days     Tresa Moore, MD [facility]   If 7PM-7AM, please contact night-coverage  08/29/2023, 11:37 AM

## 2023-08-30 ENCOUNTER — Encounter: Payer: Self-pay | Admitting: Orthopedic Surgery

## 2023-08-30 DIAGNOSIS — S83004D Unspecified dislocation of right patella, subsequent encounter: Secondary | ICD-10-CM | POA: Diagnosis not present

## 2023-08-30 LAB — GLUCOSE, CAPILLARY
Glucose-Capillary: 239 mg/dL — ABNORMAL HIGH (ref 70–99)
Glucose-Capillary: 275 mg/dL — ABNORMAL HIGH (ref 70–99)
Glucose-Capillary: 285 mg/dL — ABNORMAL HIGH (ref 70–99)
Glucose-Capillary: 313 mg/dL — ABNORMAL HIGH (ref 70–99)
Glucose-Capillary: 315 mg/dL — ABNORMAL HIGH (ref 70–99)
Glucose-Capillary: 316 mg/dL — ABNORMAL HIGH (ref 70–99)
Glucose-Capillary: 351 mg/dL — ABNORMAL HIGH (ref 70–99)
Glucose-Capillary: 382 mg/dL — ABNORMAL HIGH (ref 70–99)
Glucose-Capillary: 405 mg/dL — ABNORMAL HIGH (ref 70–99)

## 2023-08-30 MED ORDER — VITAMIN D (ERGOCALCIFEROL) 1.25 MG (50000 UNIT) PO CAPS
50000.0000 [IU] | ORAL_CAPSULE | ORAL | Status: DC
Start: 2023-08-31 — End: 2023-10-12
  Administered 2023-08-31: 50000 [IU] via ORAL
  Filled 2023-08-30 (×2): qty 1

## 2023-08-30 MED ORDER — INSULIN GLARGINE-YFGN 100 UNIT/ML ~~LOC~~ SOLN
50.0000 [IU] | Freq: Every day | SUBCUTANEOUS | Status: DC
Start: 2023-08-30 — End: 2023-08-31
  Administered 2023-08-30: 50 [IU] via SUBCUTANEOUS
  Filled 2023-08-30 (×2): qty 0.5

## 2023-08-30 MED ORDER — POLYVINYL ALCOHOL 1.4 % OP SOLN
1.0000 [drp] | OPHTHALMIC | Status: DC | PRN
  Filled 2023-08-30: qty 15

## 2023-08-30 MED ORDER — METHOCARBAMOL 500 MG PO TABS
500.0000 mg | ORAL_TABLET | Freq: Three times a day (TID) | ORAL | Status: DC
Start: 2023-08-30 — End: 2023-09-01
  Administered 2023-08-30 – 2023-09-01 (×7): 500 mg via ORAL
  Filled 2023-08-30 (×7): qty 1

## 2023-08-30 NOTE — Inpatient Diabetes Management (Addendum)
 Inpatient Diabetes Program Recommendations  AACE/ADA: New Consensus Statement on Inpatient Glycemic Control (2015)  Target Ranges:  Prepandial:   less than 140 mg/dL      Peak postprandial:   less than 180 mg/dL (1-2 hours)      Critically ill patients:  140 - 180 mg/dL   Lab Results  Component Value Date   GLUCAP 316 (H) 08/30/2023   HGBA1C 9.5 (H) 08/26/2023    Review of Glycemic Control  Latest Reference Range & Units 08/30/23 00:18 08/30/23 00:25 08/30/23 00:32 08/30/23 04:12 08/30/23 08:19 08/30/23 11:46  Glucose-Capillary 70 - 99 mg/dL 161 (H) 096 (H) 045 (H) 275 (H) 239 (H) 316 (H)   Diabetes history: DM 2 Outpatient Diabetes medications:  Lantus 50 units daily, Novolog 4 units tid meal coverage, Jardiance 25 mg daily, Metformin 1 gm bid, Ozempic 0.5 mg weekly Current orders for Inpatient glycemic control:  Semglee 30 units daily Novolog 0-9 units q 4 hours Novolog 4 units tid with meals  Inpatient Diabetes Program Recommendations:    Please consider increasing Semglee to 50 units q HS (this was home dose).  Orders received from Dr. Georgeann Oppenheim.   Addendum 1430:  Spoke to patient's mother regarding home DM management. She verified above medications and states she wants to make sure that blood sugars are controlled.  Patient wears Dexcom sensor but it is currently not reading.  Told her she can remove.  Also explained that insulin increased today to home dose.  Will follow.  Thanks,  Lorenza Cambridge, RN, BC-ADM Inpatient Diabetes Coordinator Pager [phone]  (8a-5p)

## 2023-08-30 NOTE — Progress Notes (Signed)
   Subjective: 3 Days Post-Op Procedure(s) (LRB): ARTHROSCOPY KNEE (Right) Patient reports pain as mild.  Increased pain with any type of mobility.  Did ambulate to the bedside commode and chair.  Knee immobilizer is not in place.  Slow progress with PT  Patient is well, and has had no acute complaints or problems Denies any CP, SOB, ABD pain. We will continue therapy today.  Plan is to go Rehab after hospital stay.  Objective: Vital signs in last 24 hours: Temp:  [97.8 F (36.6 C)-98.7 F (37.1 C)] 97.8 F (36.6 C) (02/24 0016) Pulse Rate:  [75-108] 75 (02/24 0016) Resp:  [16-20] 20 (02/24 0016) BP: (107-119)/(56-73) 107/56 (02/24 0016) SpO2:  [95 %-99 %] 99 % (02/24 0016) Weight:  [109.1 kg] 109.1 kg (02/24 0500)  Intake/Output from previous day: 02/23 0701 - 02/24 0700 In: 240 [P.O.:240] Out: -  Intake/Output this shift: No intake/output data recorded.  No results for input(s): "HGB" in the last 72 hours.  No results for input(s): "WBC", "RBC", "HCT", "PLT" in the last 72 hours.  Recent Labs    08/28/23 0357  NA 136  K 4.4  CL 103  CO2 23  BUN 23*  CREATININE 0.42*  GLUCOSE 232*  CALCIUM 8.4*   No results for input(s): "LABPT", "INR" in the last 72 hours.  EXAM General - Patient is Alert, Appropriate, and Oriented Extremity - Neurovascular intact Sensation intact distally Intact pulses distally Dorsiflexion/Plantar flexion intact Ace wrap intact Dressing - dressing C/D/I and no drainage Motor Function - intact, moving foot and toes well on exam.   Past Medical History:  Diagnosis Date   Diabetes mellitus without complication (HCC)     Assessment/Plan:   3 Days Post-Op Procedure(s) (LRB): ARTHROSCOPY KNEE (Right) Principal Problem:   Patellar dislocation Active Problems:   Morbid obesity (HCC)   Diabetes mellitus with neuropathy (HCC)   Type 2 diabetes mellitus without complication, with long-term current use of insulin (HCC)    Hypoxia  Estimated body mass index is 43.99 kg/m as calculated from the following:   Height as of this encounter: 5\' 2"  (1.575 m).   Weight as of this encounter: 109.1 kg. Advance diet Up with therapy, slow progress with PT. PT recommending AIR.  Pain well controlled Vital signs are stable Labs are stable CM to assist with discharge to acute inpatient rehab   Patient will need 2-week follow-up with [facility] orthopedics for suture removal Aspirin 325 mg daily for 2 weeks for DVT prophylaxis Weightbearing as tolerated with a knee immobilizer. Knee immobilizer may be removed while patient is at rest and patient may work on gentle knee range of motion exercises. Patient will keep Ace wrap and bulky dressing on until her 2-week postop appointment for suture removal    DVT Prophylaxis - Aspirin Weight-Bearing as tolerated to right leg   Dedra Skeens, PA-C [facility] 08/30/2023, 7:23 AM

## 2023-08-30 NOTE — Progress Notes (Signed)
 Inpatient Rehab Admissions Coordinator:    I met with Pt. To discuss potential CIR admit. Mother present.Video interpreter utilized. They are interested in CIR for Pt and mother is able to provide 24/7 support at home. I will send case to insurance and pursue for admit.   Megan Salon, MS, CCC-SLP Rehab Admissions Coordinator  [phone] (celll) [phone] (office)

## 2023-08-30 NOTE — Plan of Care (Signed)
  Problem: Skin Integrity: Goal: Risk for impaired skin integrity will decrease Outcome: Progressing   

## 2023-08-30 NOTE — Progress Notes (Signed)
 Occupational Therapy Treatment Patient Details Name: Linda Mason [facility] MRN: [id number] DOB: July 18, 1995 Today's Date: 08/30/2023   History of present illness Pt admitted to [facility] on 08/26/23 for c/o mechanical fall OOB that resulted in R patellar dislocation. S/p R knee arthroscopy with assisted patella reduction and lateral release. Significant PMH includes: Prader-Willi syndrome, intellectual delay, and diabetes.   OT comments  Pt seen for OT tx with PT to optimize safety with ADL/mobility attempts. Pt reporting "medium" amount of pain at start of session. Pt required MAX A +2 for STS attempts with RW and VC for hand placement. Pt unable to fully come to standing 2/2 pain. Squat pivot completed with MAX A +2 for back to bed. Set up for grooming tasks. Reassurance and encouragement provided throughout to support participation. MAX A for rolling for linens once returned to bed. Pt continues to benefit from skilled OT services.       If plan is discharge home, recommend the following:  Two people to help with walking and/or transfers;Two people to help with bathing/dressing/bathroom;Assist for transportation;Help with stairs or ramp for entrance;Supervision due to cognitive status;Assistance with cooking/housework   Equipment Recommendations  Other (comment) (defer)    Recommendations for Other Services      Precautions / Restrictions Precautions Precautions: Knee Recall of Precautions/Restrictions: Impaired Required Braces or Orthoses: Knee Immobilizer - Right Knee Immobilizer - Right: On when out of bed or walking (except when resting (supine) or with bathing) Restrictions Weight Bearing Restrictions Per Provider Order: Yes RLE Weight Bearing Per Provider Order: Weight bearing as tolerated       Mobility Bed Mobility Overal bed mobility: Needs Assistance Bed Mobility: Sit to Supine       Sit to supine: Max assist, +2 for physical assistance   General bed mobility comments:  max multimodal cues for safety, sequencing; increased time/effort secondary to increased pain levels Patient Response: Anxious  Transfers Overall transfer level: Needs assistance   Transfers: Bed to chair/wheelchair/BSC     Squat pivot transfers: Max assist, +2 physical assistance       General transfer comment: MAX VC for sequencing, pt fearful/anxious     Balance                                           ADL either performed or assessed with clinical judgement   ADL Overall ADL's : Needs assistance/impaired     Grooming: Sitting;Set up;Wash/dry face                                      Extremity/Trunk Assessment              Vision       Perception     Praxis     Communication     Cognition Arousal: Alert Behavior During Therapy: Anxious Cognition: History of cognitive impairments             OT - Cognition Comments: requires reassurance and encouragement                 Following commands: Intact        Cueing   Cueing Techniques: Verbal cues, Visual cues  Exercises      Shoulder Instructions       General Comments  Pertinent Vitals/ Pain       Pain Assessment Pain Assessment: Faces Faces Pain Scale: Hurts whole lot Pain Location: R knee Pain Descriptors / Indicators: Grimacing, Guarding, Moaning Pain Intervention(s): Limited activity within patient's tolerance, Monitored during session, Premedicated before session, Repositioned  Home Living                                          Prior Functioning/Environment              Frequency  Min 1X/week        Progress Toward Goals  OT Goals(current goals can now be found in the care plan section)  Progress towards OT goals: Progressing toward goals  Acute Rehab OT Goals Patient Stated Goal: go home OT Goal Formulation: With patient Time For Goal Achievement: 09/11/23 Potential to Achieve Goals: Good  Plan       Co-evaluation    PT/OT/SLP Co-Evaluation/Treatment: Yes Reason for Co-Treatment: Complexity of the patient's impairments (multi-system involvement);For patient/therapist safety;To address functional/ADL transfers PT goals addressed during session: Mobility/safety with mobility;Balance OT goals addressed during session: ADL's and self-care;Proper use of Adaptive equipment and DME      AM-PAC OT "6 Clicks" Daily Activity     Outcome Measure   Help from another person eating meals?: A Little Help from another person taking care of personal grooming?: A Little Help from another person toileting, which includes using toliet, bedpan, or urinal?: A Lot Help from another person bathing (including washing, rinsing, drying)?: A Lot Help from another person to put on and taking off regular upper body clothing?: A Little Help from another person to put on and taking off regular lower body clothing?: A Lot 6 Click Score: 15    End of Session Equipment Utilized During Treatment: Gait belt  OT Visit Diagnosis: Other abnormalities of gait and mobility (R26.89)   Activity Tolerance Patient tolerated treatment well   Patient Left in bed;with call bell/phone within reach;with bed alarm set;with family/visitor present   Nurse Communication Mobility status        Time: 1041-1106 OT Time Calculation (min): 25 min  Charges: OT General Charges $OT Visit: 1 Visit OT Treatments $Self Care/Home Management : 8-22 mins  Arman Filter., MPH, MS, OTR/L ascom [phone] 08/30/23, 12:35 PM

## 2023-08-30 NOTE — Progress Notes (Signed)
 PROGRESS NOTE    Miyuki Rzasa [facility]  ZOX:[id number] DOB: Jun 28, 1996 DOA: 08/26/2023 PCP: [facility]    Brief Narrative:   28 y.o. female with past medical history of Prader-Willi, syndrome, IDDM with insulin resistance, thyroidism, morbid obesity presented to ED with mechanical fall and acute right knee pain.  Medical team is consulted for diabetes control.  Noticed that the patient has been having poorly controlled diabetes with A1c consistently above 10 for the last 2 checks in 6 months.  At baseline patient uses Lantus 15 units daily and NovoLog 3 units 3 times daily AC plus metformin and Jardiance.   Patient also having breakthrough fevers.  Unclear etiology.  No white count, mild elevation in procalcitonin.  Infectious workup unrevealing thus far.  No PE.  BL LE [place] completed and pending.  No pneumonia, no UTI.  Given the circumstance, patient is as medically optimized as possible and low to moderate risk for complications.  Recs discussed with orthopedic surgery Dr Allena Katz  2/22: Patient underwent successful knee arthroscopy with orthopedics on 2/21.  Tolerated procedure well.  Postoperative pain moderate.  2/23: CIR waiting for admission  Assessment & Plan:   Principal Problem:   Patellar dislocation Active Problems:   Diabetes mellitus with neuropathy (HCC)   Morbid obesity (HCC)   Type 2 diabetes mellitus without complication, with long-term current use of insulin (HCC)   Hypoxia   IDDM with insulin resistance, poor control -Most recent A1c 11.1 in December 2024 Plan: Basal bolus regimen Nightly Semglee increased to 50 units Mealtime insulin Sliding scale coverage Continue carb modified diet   Fever Unclear etiology.  Differential is broad.  No white count elevation, mild elevation in procalcitonin.  Workup unrevealing.  Neg for flu/covid/rsv.  No pneumonia on imaging, no PE, no UTI.  No DVT.  Fevers may be secondary to pain.  Fevers have  resolved since surgery Plan: No antibiotics.  Monitor vitals and fever curve. No fevers noted  Morbid obesity Question of sleep apnea BMI 51.2.  Complicating factor in overall care and prognosis  Patellar dislocation status post knee arthroscopy Tolerated procedure well.  Knee immobilizer in place.  Aspirin 325 daily x 2 weeks Pain control, PT OT, outpatient follow-up with orthopedics in 2 weeks Per therapy will need CIR. Admissions coordinator aware Medically appropriate for discharge     DVT prophylaxis: SQ lovenox Code Status: FULL Family Communication:Mother at bedside 2/21, father at bedside 2/22, 2/23, 2/24 Disposition Plan: Status is: Inpatient Remains inpatient appropriate because: Unsafe discharge plan.  Will need CIR   Level of care: Med-Surg  Consultants:  Orthpedics  Procedures:  None  Antimicrobials:     Subjective: Sitting up in chair.  Endorses pain in the right knee.  Wishes to get back in bed.  Objective: Vitals:   08/29/23 1512 08/30/23 0016 08/30/23 0500 08/30/23 0823  BP: 119/61 (!) 107/56  132/83  Pulse: (!) 108 75  90  Resp: 20 20  16   Temp: 98.7 F (37.1 C) 97.8 F (36.6 C)  98.3 F (36.8 C)  TempSrc:  Oral  Oral  SpO2: 95% 99%  97%  Weight:   109.1 kg   Height:        Intake/Output Summary (Last 24 hours) at 08/30/2023 1232 Last data filed at 08/29/2023 2115 Gross per 24 hour  Intake 240 ml  Output --  Net 240 ml   Filed Weights   08/26/23 0343 08/28/23 0500 08/30/23 0500  Weight: 127 kg 108.4  kg 109.1 kg    Examination:  General exam: No acute distress Respiratory system: Lungs clear.  Normal work of breathing.  Room air Cardiovascular system: S1S2, RRR, no murmur Gastrointestinal system: Obese, soft, NT/ND, normal bowel sounds Central nervous system: Alert and oriented. No focal neurological deficits. Extremities: Right knee immobilizer not in place.  Decreased ROM.  Gait not assessed Skin: No rashes, lesions or  ulcers Psychiatry: Judgement and insight appear normal. Mood & affect appropriate.     Data Reviewed: I have personally reviewed following labs and imaging studies  CBC: Recent Labs  Lab 08/26/23 1936  WBC 9.6  HGB 13.9  HCT 42.8  MCV 95.1  PLT 54*   Basic Metabolic Panel: Recent Labs  Lab 08/26/23 1936 08/26/23 1942 08/27/23 0650 08/28/23 0357  NA 135  --  139 136  K 4.5  --  4.5 4.4  CL 102  --  107 103  CO2 21*  --  23 23  GLUCOSE 153*  --  168* 232*  BUN 17  --  24* 23*  CREATININE 0.37*  --  0.51 0.42*  CALCIUM 8.9  --  8.8* 8.4*  MG  --  1.9  --  1.9  PHOS  --   --   --  3.1   GFR: Estimated Creatinine Clearance: 121.8 mL/min (A) (by C-G formula based on SCr of 0.42 mg/dL (L)). Liver Function Tests: Recent Labs  Lab 08/26/23 1936  AST 57*  ALT 74*  ALKPHOS 123  BILITOT 2.4*  PROT 6.9  ALBUMIN 3.3*   No results for input(s): "LIPASE", "AMYLASE" in the last 168 hours. No results for input(s): "AMMONIA" in the last 168 hours. Coagulation Profile: No results for input(s): "INR", "PROTIME" in the last 168 hours. Cardiac Enzymes: No results for input(s): "CKTOTAL", "CKMB", "CKMBINDEX", "TROPONINI" in the last 168 hours. BNP (last 3 results) No results for input(s): "PROBNP" in the last 8760 hours. HbA1C: No results for input(s): "HGBA1C" in the last 72 hours.  CBG: Recent Labs  Lab 08/30/23 0025 08/30/23 0032 08/30/23 0412 08/30/23 0819 08/30/23 1146  GLUCAP 351* 315* 275* 239* 316*   Lipid Profile: No results for input(s): "CHOL", "HDL", "LDLCALC", "TRIG", "CHOLHDL", "LDLDIRECT" in the last 72 hours. Thyroid Function Tests: No results for input(s): "TSH", "T4TOTAL", "FREET4", "T3FREE", "THYROIDAB" in the last 72 hours. Anemia Panel: No results for input(s): "VITAMINB12", "FOLATE", "FERRITIN", "TIBC", "IRON", "RETICCTPCT" in the last 72 hours. Sepsis Labs: Recent Labs  Lab 08/26/23 1936 08/26/23 1942 08/26/23 2151  PROCALCITON  --  0.38   --   LATICACIDVEN 1.4  --  1.3    Recent Results (from the past 240 hours)  Culture, blood (Routine X 2) w Reflex to ID Panel     Status: None (Preliminary result)   Collection Time: 08/26/23  7:38 PM   Specimen: BLOOD  Result Value Ref Range Status   Specimen Description BLOOD BLOOD LEFT ARM  Final   Special Requests   Final    BOTTLES DRAWN AEROBIC AND ANAEROBIC Blood Culture adequate volume   Culture   Final    NO GROWTH 4 DAYS Performed at [facility], [address] [place], [place] [address]    Report Status PENDING  Incomplete  Culture, blood (Routine X 2) w Reflex to ID Panel     Status: None (Preliminary result)   Collection Time: 08/26/23  7:42 PM   Specimen: BLOOD  Result Value Ref Range Status   Specimen Description BLOOD BLOOD  LEFT HAND  Final   Special Requests   Final    BOTTLES DRAWN AEROBIC ONLY Blood Culture results may not be optimal due to an inadequate volume of blood received in culture bottles   Culture   Final    NO GROWTH 4 DAYS Performed at [facility], [address] [place], [place] [address]    Report Status PENDING  Incomplete  Surgical PCR screen     Status: Abnormal   Collection Time: 08/26/23  8:08 PM   Specimen: Nasal Mucosa; Nasal Swab  Result Value Ref Range Status   MRSA, PCR NEGATIVE NEGATIVE Final   Staphylococcus aureus POSITIVE (A) NEGATIVE Final    Comment: (NOTE) The Xpert SA Assay (FDA approved for NASAL specimens in patients 74 years of age and older), is one component of a comprehensive surveillance program. It is not intended to diagnose infection nor to guide or monitor treatment. Performed at [facility], [address] [place], [place] [address]   Resp panel by RT-PCR (RSV, Flu A&B, Covid) Anterior Nasal Swab     Status: None   Collection Time: 08/26/23  9:14 PM   Specimen: Anterior Nasal Swab  Result Value Ref Range Status   SARS Coronavirus 2 by RT PCR NEGATIVE NEGATIVE Final     Comment: (NOTE) SARS-CoV-2 target nucleic acids are NOT DETECTED.  The SARS-CoV-2 RNA is generally detectable in upper respiratory specimens during the acute phase of infection. The lowest concentration of SARS-CoV-2 viral copies this assay can detect is 138 copies/mL. A negative result does not preclude SARS-Cov-2 infection and should not be used as the sole basis for treatment or other patient management decisions. A negative result may occur with  improper specimen collection/handling, submission of specimen other than nasopharyngeal swab, presence of viral mutation(s) within the areas targeted by this assay, and inadequate number of viral copies(<138 copies/mL). A negative result must be combined with clinical observations, patient history, and epidemiological information. The expected result is Negative.  Fact Sheet for Patients:  BloggerCourse.com  Fact Sheet for Healthcare Providers:  SeriousBroker.it  This test is no t yet approved or cleared by the [place] FDA and  has been authorized for detection and/or diagnosis of SARS-CoV-2 by FDA under an Emergency Use Authorization (EUA). This EUA will remain  in effect (meaning this test can be used) for the duration of the COVID-19 declaration under Section 564(b)(1) of the Act, 21 U.S.C.section 360bbb-3(b)(1), unless the authorization is terminated  or revoked sooner.       Influenza A by PCR NEGATIVE NEGATIVE Final   Influenza B by PCR NEGATIVE NEGATIVE Final    Comment: (NOTE) The Xpert Xpress SARS-CoV-2/FLU/RSV plus assay is intended as an aid in the diagnosis of influenza from Nasopharyngeal swab specimens and should not be used as a sole basis for treatment. Nasal washings and aspirates are unacceptable for Xpert Xpress SARS-CoV-2/FLU/RSV testing.  Fact Sheet for Patients: BloggerCourse.com  Fact Sheet for Healthcare  Providers: SeriousBroker.it  This test is not yet approved or cleared by the [place] FDA and has been authorized for detection and/or diagnosis of SARS-CoV-2 by FDA under an Emergency Use Authorization (EUA). This EUA will remain in effect (meaning this test can be used) for the duration of the COVID-19 declaration under Section 564(b)(1) of the Act, 21 U.S.C. section 360bbb-3(b)(1), unless the authorization is terminated or revoked.     Resp Syncytial Virus by PCR NEGATIVE NEGATIVE Final    Comment: (NOTE) Fact Sheet for  Patients: BloggerCourse.com  Fact Sheet for Healthcare Providers: SeriousBroker.it  This test is not yet approved or cleared by the [place] FDA and has been authorized for detection and/or diagnosis of SARS-CoV-2 by FDA under an Emergency Use Authorization (EUA). This EUA will remain in effect (meaning this test can be used) for the duration of the COVID-19 declaration under Section 564(b)(1) of the Act, 21 U.S.C. section 360bbb-3(b)(1), unless the authorization is terminated or revoked.  Performed at [facility], [address] [place], [place] [address]   Respiratory (~20 pathogens) panel by PCR     Status: None   Collection Time: 08/27/23  8:27 AM   Specimen: Nasopharyngeal Swab; Respiratory  Result Value Ref Range Status   Adenovirus NOT DETECTED NOT DETECTED Final   Coronavirus 229E NOT DETECTED NOT DETECTED Final    Comment: (NOTE) The Coronavirus on the Respiratory Panel, DOES NOT test for the novel  Coronavirus (2019 nCoV)    Coronavirus HKU1 NOT DETECTED NOT DETECTED Final   Coronavirus NL63 NOT DETECTED NOT DETECTED Final   Coronavirus OC43 NOT DETECTED NOT DETECTED Final   Metapneumovirus NOT DETECTED NOT DETECTED Final   Rhinovirus / Enterovirus NOT DETECTED NOT DETECTED Final   Influenza A NOT DETECTED NOT DETECTED Final   Influenza B NOT  DETECTED NOT DETECTED Final   Parainfluenza Virus 1 NOT DETECTED NOT DETECTED Final   Parainfluenza Virus 2 NOT DETECTED NOT DETECTED Final   Parainfluenza Virus 3 NOT DETECTED NOT DETECTED Final   Parainfluenza Virus 4 NOT DETECTED NOT DETECTED Final   Respiratory Syncytial Virus NOT DETECTED NOT DETECTED Final   Bordetella pertussis NOT DETECTED NOT DETECTED Final   Bordetella Parapertussis NOT DETECTED NOT DETECTED Final   Chlamydophila pneumoniae NOT DETECTED NOT DETECTED Final   Mycoplasma pneumoniae NOT DETECTED NOT DETECTED Final    Comment: Performed at [facility], [address] [place], [place] [address]         Radiology Studies: No results found.       Scheduled Meds:  acetaminophen  1,000 mg Oral Q8H   aspirin EC  325 mg Oral Daily   docusate sodium  100 mg Oral BID   gabapentin  100 mg Oral TID   insulin aspart  0-9 Units Subcutaneous Q4H   insulin aspart  4 Units Subcutaneous TID WC   insulin glargine-yfgn  50 Units Subcutaneous QHS   levothyroxine  50 mcg Oral Q0600   lisinopril  40 mg Oral Daily   methocarbamol  500 mg Oral TID   metoprolol succinate  25 mg Oral Daily   multivitamin with minerals  1 tablet Oral Daily   mupirocin ointment  1 Application Nasal BID   naproxen  500 mg Oral BID WC   pantoprazole  80 mg Oral Daily   Ensure Max Protein  11 oz Oral BID   Continuous Infusions:     LOS: 3 days     Tresa Moore, MD [facility]   If 7PM-7AM, please contact night-coverage  08/30/2023, 12:32 PM

## 2023-08-30 NOTE — Anesthesia Postprocedure Evaluation (Signed)
 Anesthesia Post Note  Patient: Ailis Rigaud [facility]  Procedure(s) Performed: ARTHROSCOPY KNEE (Right: Knee)  Patient location during evaluation: PACU Anesthesia Type: General Level of consciousness: awake and alert Pain management: pain level controlled Vital Signs Assessment: post-procedure vital signs reviewed and stable Respiratory status: spontaneous breathing, nonlabored ventilation, respiratory function stable and patient connected to nasal cannula oxygen Cardiovascular status: blood pressure returned to baseline and stable Postop Assessment: no apparent nausea or vomiting Anesthetic complications: no   No notable events documented.   Last Vitals:  Vitals:   08/29/23 1512 08/30/23 0016  BP: 119/61 (!) 107/56  Pulse: (!) 108 75  Resp: 20 20  Temp: 37.1 C 36.6 C  SpO2: 95% 99%    Last Pain:  Vitals:   08/30/23 0521  TempSrc:   PainSc: Asleep                 Louie Boston

## 2023-08-30 NOTE — Progress Notes (Signed)
 Physical Therapy Treatment Patient Details Name: Allure Greaser MRN: [id number] DOB: 1995/08/16 Today's Date: 08/30/2023   History of Present Illness Pt admitted to [facility] on 08/26/23 for c/o mechanical fall OOB that resulted in R patellar dislocation. S/p R knee arthroscopy with assisted patella reduction and lateral release. Significant PMH includes: Prader-Willi syndrome, intellectual delay, and diabetes.    PT Comments  Patient alert, up in chair upon PT/OT entrance, demonstrated significant pain signs/symptoms throughout session despite premedicating. Pt able to sit<>stand with maxAx2 and RW, but unable to truly take a step. Set up for lateral scoot pivot but with pt anxious/emotional about pain, squat pivot back to bed maxAx2. Returned to supine maxAx2 as well and repositioned for comfort. The patient would benefit from further skilled PT intervention to continue to progress towards goals.    If plan is discharge home, recommend the following: Assistance with cooking/housework;Direct supervision/assist for medications management;Assist for transportation;Help with stairs or ramp for entrance;Supervision due to cognitive status;Two people to help with bathing/dressing/bathroom;Two people to help with walking and/or transfers   Can travel by private vehicle        Equipment Recommendations       Recommendations for Other Services       Precautions / Restrictions Precautions Precautions: Knee Precaution Booklet Issued: No Recall of Precautions/Restrictions: Impaired Required Braces or Orthoses: Knee Immobilizer - Right Knee Immobilizer - Right: On when out of bed or walking (except when resting or bathing) Restrictions Weight Bearing Restrictions Per Provider Order: Yes RLE Weight Bearing Per Provider Order: Weight bearing as tolerated     Mobility  Bed Mobility Overal bed mobility: Needs Assistance Bed Mobility: Sit to Supine     Supine to sit: Max assist, +2 for  physical assistance          Transfers Overall transfer level: Needs assistance Equipment used: Rolling walker (2 wheels), 2 person hand held assist Transfers: Bed to chair/wheelchair/BSC       Squat pivot transfers: Max assist, +2 physical assistance     General transfer comment: MAX VC for sequencing, pt fearful/anxious    Ambulation/Gait               General Gait Details: pt declined today due to pain wanting to return to bed   Stairs             Wheelchair Mobility     Tilt Bed    Modified Rankin (Stroke Patients Only)       Balance Overall balance assessment: Needs assistance (Simultaneous filing. User may not have seen previous data.) Sitting-balance support: No upper extremity supported, Feet supported (Simultaneous filing. User may not have seen previous data.) Sitting balance-Leahy Scale: Good (Simultaneous filing. User may not have seen previous data.)     Standing balance support: Bilateral upper extremity supported, During functional activity (Simultaneous filing. User may not have seen previous data.) Standing balance-Leahy Scale: Poor (Simultaneous filing. User may not have seen previous data.) Standing balance comment: reliant on support                            Communication    Cognition Arousal: Alert Behavior During Therapy: WFL for tasks assessed/performed, Anxious   PT - Cognitive impairments: History of cognitive impairments, Attention                       PT - Cognition Comments: spanish speaking but does understand basic english  Following commands: Intact      Cueing Cueing Techniques: Verbal cues, Visual cues  Exercises      General Comments        Pertinent Vitals/Pain Pain Assessment Pain Assessment: Faces Faces Pain Scale: Hurts whole lot Pain Location: R knee Pain Descriptors / Indicators: Grimacing, Guarding, Moaning, Crying Pain Intervention(s): Limited activity within patient's  tolerance, Monitored during session, Premedicated before session, Repositioned, Ice applied    Home Living                          Prior Function            PT Goals (current goals can now be found in the care plan section) Progress towards PT goals: Progressing toward goals    Frequency    Min 1X/week      PT Plan      Co-evaluation PT/OT/SLP Co-Evaluation/Treatment: Yes Reason for Co-Treatment: Complexity of the patient's impairments (multi-system involvement);For patient/therapist safety;To address functional/ADL transfers PT goals addressed during session: Mobility/safety with mobility;Balance OT goals addressed during session: ADL's and self-care;Proper use of Adaptive equipment and DME      AM-PAC PT "6 Clicks" Mobility   Outcome Measure  Help needed turning from your back to your side while in a flat bed without using bedrails?: A Lot Help needed moving from lying on your back to sitting on the side of a flat bed without using bedrails?: Total Help needed moving to and from a bed to a chair (including a wheelchair)?: Total Help needed standing up from a chair using your arms (e.g., wheelchair or bedside chair)?: Total Help needed to walk in hospital room?: Total Help needed climbing 3-5 steps with a railing? : Total 6 Click Score: 7    End of Session Equipment Utilized During Treatment: Right knee immobilizer;Gait belt Activity Tolerance: Patient limited by pain;Other (comment) (limited due to anxiety) Patient left: in bed;with call bell/phone within reach;with bed alarm set;with family/visitor present Nurse Communication: Mobility status;Patient requests pain meds;Weight bearing status PT Visit Diagnosis: Unsteadiness on feet (R26.81);History of falling (Z91.81);Muscle weakness (generalized) (M62.81);Pain;Difficulty in walking, not elsewhere classified (R26.2) Pain - Right/Left: Right Pain - part of body: Knee     Time: 1610-9604 PT Time  Calculation (min) (ACUTE ONLY): 25 min  Charges:    $Therapeutic Activity: 8-22 mins PT General Charges $$ ACUTE PT VISIT: 1 Visit                     Olga Coaster PT, DPT 12:36 PM,08/30/23

## 2023-08-31 DIAGNOSIS — S83004D Unspecified dislocation of right patella, subsequent encounter: Secondary | ICD-10-CM | POA: Diagnosis not present

## 2023-08-31 LAB — CULTURE, BLOOD (ROUTINE X 2)
Culture: NO GROWTH
Culture: NO GROWTH
Special Requests: ADEQUATE

## 2023-08-31 LAB — GLUCOSE, CAPILLARY
Glucose-Capillary: 231 mg/dL — ABNORMAL HIGH (ref 70–99)
Glucose-Capillary: 237 mg/dL — ABNORMAL HIGH (ref 70–99)
Glucose-Capillary: 252 mg/dL — ABNORMAL HIGH (ref 70–99)
Glucose-Capillary: 270 mg/dL — ABNORMAL HIGH (ref 70–99)
Glucose-Capillary: 353 mg/dL — ABNORMAL HIGH (ref 70–99)
Glucose-Capillary: 364 mg/dL — ABNORMAL HIGH (ref 70–99)
Glucose-Capillary: 388 mg/dL — ABNORMAL HIGH (ref 70–99)
Glucose-Capillary: 468 mg/dL — ABNORMAL HIGH (ref 70–99)

## 2023-08-31 MED ORDER — INSULIN ASPART 100 UNIT/ML IJ SOLN
6.0000 [IU] | Freq: Three times a day (TID) | INTRAMUSCULAR | Status: DC
  Administered 2023-09-01 (×2): 6 [IU] via SUBCUTANEOUS
  Filled 2023-08-31 (×2): qty 1

## 2023-08-31 MED ORDER — POLYETHYLENE GLYCOL 3350 17 G PO PACK
17.0000 g | PACK | Freq: Every day | ORAL | Status: DC
  Administered 2023-08-31 – 2023-09-01 (×2): 17 g via ORAL
  Filled 2023-08-31 (×2): qty 1

## 2023-08-31 MED ORDER — INSULIN GLARGINE-YFGN 100 UNIT/ML ~~LOC~~ SOLN
55.0000 [IU] | Freq: Every day | SUBCUTANEOUS | Status: DC
  Administered 2023-08-31: 55 [IU] via SUBCUTANEOUS
  Filled 2023-08-31 (×2): qty 0.55

## 2023-08-31 MED ORDER — INSULIN ASPART 100 UNIT/ML IJ SOLN
0.0000 [IU] | INTRAMUSCULAR | Status: DC
  Administered 2023-08-31 (×2): 20 [IU] via SUBCUTANEOUS
  Administered 2023-09-01: 7 [IU] via SUBCUTANEOUS
  Administered 2023-09-01: 11 [IU] via SUBCUTANEOUS
  Administered 2023-09-01: 7 [IU] via SUBCUTANEOUS
  Administered 2023-09-01: 15 [IU] via SUBCUTANEOUS
  Filled 2023-08-31 (×6): qty 1

## 2023-08-31 NOTE — Progress Notes (Signed)
 Physical Therapy Treatment Patient Details Name: Linda Mason MRN: [id number] DOB: [birth year] Today's Date: 08/31/2023   History of Present Illness Pt admitted to [facility] on 08/26/23 for c/o mechanical fall OOB that resulted in R patellar dislocation. S/p R knee arthroscopy with assisted patella reduction and lateral release. Significant PMH includes: Prader-Willi syndrome, intellectual delay, and diabetes.    PT Comments  Patient alert, but very very anxious with the thought of mobility today. Relaxation techniques used throughout and RN in room to administer pain medication. Pt endorsed being fearful of pain when asked. maxAx2 to come to sitting EOB, once repositioned and calm, able to maintain with supervision. Sit <> stand with stedy, initially minA but with repetition (~3 more times) CGA with stedy rail. Able to maintain standing ~32minute with each bout. Noted for improved TKE in RLE with standing and returning to bed after. MaxAx1-2 to don and doff KI. Pt in bed with needs in reach at end of session, she would benefit from continued skilled PT services to maximize function safety and independence.     If plan is discharge home, recommend the following: Assistance with cooking/housework;Direct supervision/assist for medications management;Assist for transportation;Help with stairs or ramp for entrance;Supervision due to cognitive status;Two people to help with bathing/dressing/bathroom;Two people to help with walking and/or transfers   Can travel by private vehicle        Equipment Recommendations       Recommendations for Other Services       Precautions / Restrictions Precautions Precautions: Knee Precaution Booklet Issued: No Recall of Precautions/Restrictions: Impaired Knee Immobilizer - Right: On when out of bed or walking Restrictions Weight Bearing Restrictions Per Provider Order: Yes RLE Weight Bearing Per Provider Order: Weight bearing as tolerated     Mobility   Bed Mobility Overal bed mobility: Needs Assistance Bed Mobility: Sit to Supine, Supine to Sit     Supine to sit: Max assist, +2 for physical assistance Sit to supine: Max assist, +2 for physical assistance   General bed mobility comments: step by step multi modal cues for technique, safety, reassurance but improved tolerance with sit>supine vs supine>sit    Transfers Overall transfer level: Needs assistance Equipment used: Ambulation equipment used Transfers: Sit to/from Stand Sit to Stand: Via lift equipment           General transfer comment: pt stands in stedy 4x with CGA, first rep minA, verbal cues for techinque, maintains standing for approx 30seconds each attempt Transfer via Lift Equipment: Stedy  Ambulation/Gait                   Stairs             Wheelchair Mobility     Tilt Bed    Modified Rankin (Stroke Patients Only)       Balance Overall balance assessment: Needs assistance Sitting-balance support: No upper extremity supported, Feet supported Sitting balance-Leahy Scale: Good Sitting balance - Comments: once positioned in midline and calmer able to sit with supervision   Standing balance support: Bilateral upper extremity supported, During functional activity Standing balance-Leahy Scale: Poor                              Communication Communication Communication: No apparent difficulties  Cognition Arousal: Alert Behavior During Therapy: WFL for tasks assessed/performed, Anxious   PT - Cognitive impairments: History of cognitive impairments, Attention  PT - Cognition Comments: spanish speaking but does understand basic english        Cueing    Exercises      General Comments        Pertinent Vitals/Pain Pain Assessment Pain Assessment: Faces Faces Pain Scale: Hurts whole lot Pain Location: R knee Pain Descriptors / Indicators: Grimacing, Guarding, Moaning, Crying Pain  Intervention(s): Limited activity within patient's tolerance, Monitored during session, Repositioned (anxious for all mobility, enjoys deep pressure, hugs, phone for dogs)    Home Living                          Prior Function            PT Goals (current goals can now be found in the care plan section) Progress towards PT goals: Progressing toward goals    Frequency    Min 1X/week      PT Plan      Co-evaluation PT/OT/SLP Co-Evaluation/Treatment: Yes Reason for Co-Treatment: Complexity of the patient's impairments (multi-system involvement);For patient/therapist safety;To address functional/ADL transfers PT goals addressed during session: Mobility/safety with mobility;Balance OT goals addressed during session: ADL's and self-care;Proper use of Adaptive equipment and DME      AM-PAC PT "6 Clicks" Mobility   Outcome Measure  Help needed turning from your back to your side while in a flat bed without using bedrails?: A Lot Help needed moving from lying on your back to sitting on the side of a flat bed without using bedrails?: Total Help needed moving to and from a bed to a chair (including a wheelchair)?: Total Help needed standing up from a chair using your arms (e.g., wheelchair or bedside chair)?: Total Help needed to walk in hospital room?: Total Help needed climbing 3-5 steps with a railing? : Total 6 Click Score: 7    End of Session Equipment Utilized During Treatment: Right knee immobilizer Activity Tolerance: Other (comment) (limited due to anxiety) Patient left: in bed;with call bell/phone within reach;with bed alarm set;with family/visitor present Nurse Communication: Mobility status;Weight bearing status PT Visit Diagnosis: Unsteadiness on feet (R26.81);History of falling (Z91.81);Muscle weakness (generalized) (M62.81);Pain;Difficulty in walking, not elsewhere classified (R26.2) Pain - Right/Left: Right Pain - part of body: Knee     Time:  4696-2952 PT Time Calculation (min) (ACUTE ONLY): 43 min  Charges:    $Therapeutic Exercise: 8-22 mins $Neuromuscular Re-education: 8-22 mins PT General Charges $$ ACUTE PT VISIT: 1 Visit                     Olga Coaster PT, DPT 4:01 PM,08/31/23

## 2023-08-31 NOTE — Progress Notes (Signed)
 PROGRESS NOTE    Libra Gatz [facility]  XBJ:[id number] DOB: 1995-07-21 DOA: 08/26/2023 PCP: [facility]    Brief Narrative:   28 y.o. female with past medical history of Prader-Willi, syndrome, IDDM with insulin resistance, thyroidism, morbid obesity presented to ED with mechanical fall and acute right knee pain.  Medical team is consulted for diabetes control.  Noticed that the patient has been having poorly controlled diabetes with A1c consistently above 10 for the last 2 checks in 6 months.  At baseline patient uses Lantus 15 units daily and NovoLog 3 units 3 times daily AC plus metformin and Jardiance.   Patient also having breakthrough fevers.  Unclear etiology.  No white count, mild elevation in procalcitonin.  Infectious workup unrevealing thus far.  No PE.  BL LE [place] completed and pending.  No pneumonia, no UTI.  Given the circumstance, patient is as medically optimized as possible and low to moderate risk for complications.  Recs discussed with orthopedic surgery Dr Allena Katz  2/22: Patient underwent successful knee arthroscopy with orthopedics on 2/21.  Tolerated procedure well.  Postoperative pain moderate.  2/23: CIR waiting for admission  Assessment & Plan:   Principal Problem:   Patellar dislocation Active Problems:   Diabetes mellitus with neuropathy (HCC)   Morbid obesity (HCC)   Type 2 diabetes mellitus without complication, with long-term current use of insulin (HCC)   Hypoxia   IDDM with insulin resistance, poor control -Most recent A1c 11.1 in December 2024 Plan: Basal bolus regimen Nightly Semglee increased to 55 units Mealtime insulin, increase to 6 units 3 times daily with meals Sliding scale coverage Continue carb modified diet   Fever Unclear etiology.  Differential is broad.  No white count elevation, mild elevation in procalcitonin.  Workup unrevealing.  Neg for flu/covid/rsv.  No pneumonia on imaging, no PE, no UTI.  No DVT.   Fevers may be secondary to pain.  Fevers have resolved since surgery.  Possible related to pain Plan: No antibiotics.  Monitor vitals and fever curve. No fevers noted  Morbid obesity Prader-Willi syndrome Question of sleep apnea BMI 51.2.  Complicating factor in overall care and prognosis  Patellar dislocation status post knee arthroscopy Tolerated procedure well.  Knee immobilizer in place.  Aspirin 325 daily x 2 weeks Pain control, PT OT, outpatient follow-up with orthopedics in 2 weeks Per therapy will need CIR. Admissions coordinator aware Medically appropriate for discharge     DVT prophylaxis: SQ lovenox Code Status: FULL Family Communication:Mother at bedside 2/21, father at bedside 2/22, 2/23, 2/24, 2/25 Disposition Plan: Status is: Inpatient Remains inpatient appropriate because: Unsafe discharge plan.  Will need CIR.  Medically stable for discharge   Level of care: Med-Surg  Consultants:  Orthpedics  Procedures:  None  Antimicrobials:     Subjective: Seen and examined.  Attempting to have BM this morning.  Still complains of pain in right knee  Objective: Vitals:   08/30/23 1538 08/31/23 0004 08/31/23 0500 08/31/23 0720  BP: (!) 107/53 119/60  (!) 109/49  Pulse: 64 86  89  Resp: 18 18  16   Temp: 98.3 F (36.8 C) 98.1 F (36.7 C)  98.7 F (37.1 C)  TempSrc:  Oral    SpO2: 98% 96%  98%  Weight:   110.8 kg   Height:        Intake/Output Summary (Last 24 hours) at 08/31/2023 1351 Last data filed at 08/31/2023 1349 Gross per 24 hour  Intake 120 ml  Output  300 ml  Net -180 ml   Filed Weights   08/28/23 0500 08/30/23 0500 08/31/23 0500  Weight: 108.4 kg 109.1 kg 110.8 kg    Examination:  General exam: NAD Respiratory system: Lungs clear.  Normal work of breathing.  Room air Cardiovascular system: S1S2, RRR, no murmur Gastrointestinal system: Obese, soft, NT/ND, normal bowel sounds Central nervous system: Alert and oriented. No focal  neurological deficits. Extremities: Right knee immobilizer in place.  Decreased ROM.  Gait not assessed Skin: No rashes, lesions or ulcers Psychiatry: Judgement and insight appear normal. Mood & affect appropriate.     Data Reviewed: I have personally reviewed following labs and imaging studies  CBC: Recent Labs  Lab 08/26/23 1936  WBC 9.6  HGB 13.9  HCT 42.8  MCV 95.1  PLT 54*   Basic Metabolic Panel: Recent Labs  Lab 08/26/23 1936 08/26/23 1942 08/27/23 0650 08/28/23 0357  NA 135  --  139 136  K 4.5  --  4.5 4.4  CL 102  --  107 103  CO2 21*  --  23 23  GLUCOSE 153*  --  168* 232*  BUN 17  --  24* 23*  CREATININE 0.37*  --  0.51 0.42*  CALCIUM 8.9  --  8.8* 8.4*  MG  --  1.9  --  1.9  PHOS  --   --   --  3.1   GFR: Estimated Creatinine Clearance: 123 mL/min (A) (by C-G formula based on SCr of 0.42 mg/dL (L)). Liver Function Tests: Recent Labs  Lab 08/26/23 1936  AST 57*  ALT 74*  ALKPHOS 123  BILITOT 2.4*  PROT 6.9  ALBUMIN 3.3*   No results for input(s): "LIPASE", "AMYLASE" in the last 168 hours. No results for input(s): "AMMONIA" in the last 168 hours. Coagulation Profile: No results for input(s): "INR", "PROTIME" in the last 168 hours. Cardiac Enzymes: No results for input(s): "CKTOTAL", "CKMB", "CKMBINDEX", "TROPONINI" in the last 168 hours. BNP (last 3 results) No results for input(s): "PROBNP" in the last 8760 hours. HbA1C: No results for input(s): "HGBA1C" in the last 72 hours.  CBG: Recent Labs  Lab 08/31/23 0042 08/31/23 0329 08/31/23 0511 08/31/23 0721 08/31/23 1150  GLUCAP 270* 252* 237* 231* 353*   Lipid Profile: No results for input(s): "CHOL", "HDL", "LDLCALC", "TRIG", "CHOLHDL", "LDLDIRECT" in the last 72 hours. Thyroid Function Tests: No results for input(s): "TSH", "T4TOTAL", "FREET4", "T3FREE", "THYROIDAB" in the last 72 hours. Anemia Panel: No results for input(s): "VITAMINB12", "FOLATE", "FERRITIN", "TIBC", "IRON",  "RETICCTPCT" in the last 72 hours. Sepsis Labs: Recent Labs  Lab 08/26/23 1936 08/26/23 1942 08/26/23 2151  PROCALCITON  --  0.38  --   LATICACIDVEN 1.4  --  1.3    Recent Results (from the past 240 hours)  Culture, blood (Routine X 2) w Reflex to ID Panel     Status: None   Collection Time: 08/26/23  7:38 PM   Specimen: BLOOD  Result Value Ref Range Status   Specimen Description BLOOD BLOOD LEFT ARM  Final   Special Requests   Final    BOTTLES DRAWN AEROBIC AND ANAEROBIC Blood Culture adequate volume   Culture   Final    NO GROWTH 5 DAYS Performed at [facility], [address], [place], [place] [address]    Report Status 08/31/2023 FINAL  Final  Culture, blood (Routine X 2) w Reflex to ID Panel     Status: None   Collection Time: 08/26/23  7:42  PM   Specimen: BLOOD  Result Value Ref Range Status   Specimen Description BLOOD BLOOD LEFT HAND  Final   Special Requests   Final    BOTTLES DRAWN AEROBIC ONLY Blood Culture results may not be optimal due to an inadequate volume of blood received in culture bottles   Culture   Final    NO GROWTH 5 DAYS Performed at [facility], [address], [place], [place] [address]    Report Status 08/31/2023 FINAL  Final  Surgical PCR screen     Status: Abnormal   Collection Time: 08/26/23  8:08 PM   Specimen: Nasal Mucosa; Nasal Swab  Result Value Ref Range Status   MRSA, PCR NEGATIVE NEGATIVE Final   Staphylococcus aureus POSITIVE (A) NEGATIVE Final    Comment: (NOTE) The Xpert SA Assay (FDA approved for NASAL specimens in patients 4 years of age and older), is one component of a comprehensive surveillance program. It is not intended to diagnose infection nor to guide or monitor treatment. Performed at [facility], [address] [place], [place] [address]   Resp panel by RT-PCR (RSV, Flu A&B, Covid) Anterior Nasal Swab     Status: None   Collection Time: 08/26/23  9:14 PM   Specimen: Anterior  Nasal Swab  Result Value Ref Range Status   SARS Coronavirus 2 by RT PCR NEGATIVE NEGATIVE Final    Comment: (NOTE) SARS-CoV-2 target nucleic acids are NOT DETECTED.  The SARS-CoV-2 RNA is generally detectable in upper respiratory specimens during the acute phase of infection. The lowest concentration of SARS-CoV-2 viral copies this assay can detect is 138 copies/mL. A negative result does not preclude SARS-Cov-2 infection and should not be used as the sole basis for treatment or other patient management decisions. A negative result may occur with  improper specimen collection/handling, submission of specimen other than nasopharyngeal swab, presence of viral mutation(s) within the areas targeted by this assay, and inadequate number of viral copies(<138 copies/mL). A negative result must be combined with clinical observations, patient history, and epidemiological information. The expected result is Negative.  Fact Sheet for Patients:  BloggerCourse.com  Fact Sheet for Healthcare Providers:  SeriousBroker.it  This test is no t yet approved or cleared by the [place] FDA and  has been authorized for detection and/or diagnosis of SARS-CoV-2 by FDA under an Emergency Use Authorization (EUA). This EUA will remain  in effect (meaning this test can be used) for the duration of the COVID-19 declaration under Section 564(b)(1) of the Act, 21 U.S.C.section 360bbb-3(b)(1), unless the authorization is terminated  or revoked sooner.       Influenza A by PCR NEGATIVE NEGATIVE Final   Influenza B by PCR NEGATIVE NEGATIVE Final    Comment: (NOTE) The Xpert Xpress SARS-CoV-2/FLU/RSV plus assay is intended as an aid in the diagnosis of influenza from Nasopharyngeal swab specimens and should not be used as a sole basis for treatment. Nasal washings and aspirates are unacceptable for Xpert Xpress SARS-CoV-2/FLU/RSV testing.  Fact Sheet for  Patients: BloggerCourse.com  Fact Sheet for Healthcare Providers: SeriousBroker.it  This test is not yet approved or cleared by the [place] FDA and has been authorized for detection and/or diagnosis of SARS-CoV-2 by FDA under an Emergency Use Authorization (EUA). This EUA will remain in effect (meaning this test can be used) for the duration of the COVID-19 declaration under Section 564(b)(1) of the Act, 21 U.S.C. section 360bbb-3(b)(1), unless the authorization is terminated or revoked.  Resp Syncytial Virus by PCR NEGATIVE NEGATIVE Final    Comment: (NOTE) Fact Sheet for Patients: BloggerCourse.com  Fact Sheet for Healthcare Providers: SeriousBroker.it  This test is not yet approved or cleared by the [place] FDA and has been authorized for detection and/or diagnosis of SARS-CoV-2 by FDA under an Emergency Use Authorization (EUA). This EUA will remain in effect (meaning this test can be used) for the duration of the COVID-19 declaration under Section 564(b)(1) of the Act, 21 U.S.C. section 360bbb-3(b)(1), unless the authorization is terminated or revoked.  Performed at [place] [facility], [address] [place], [place] [address]   Respiratory (~20 pathogens) panel by PCR     Status: None   Collection Time: 08/27/23  8:27 AM   Specimen: Nasopharyngeal Swab; Respiratory  Result Value Ref Range Status   Adenovirus NOT DETECTED NOT DETECTED Final   Coronavirus 229E NOT DETECTED NOT DETECTED Final    Comment: (NOTE) The Coronavirus on the Respiratory Panel, DOES NOT test for the novel  Coronavirus (2019 nCoV)    Coronavirus HKU1 NOT DETECTED NOT DETECTED Final   Coronavirus NL63 NOT DETECTED NOT DETECTED Final   Coronavirus OC43 NOT DETECTED NOT DETECTED Final   Metapneumovirus NOT DETECTED NOT DETECTED Final   Rhinovirus / Enterovirus NOT DETECTED NOT  DETECTED Final   Influenza A NOT DETECTED NOT DETECTED Final   Influenza B NOT DETECTED NOT DETECTED Final   Parainfluenza Virus 1 NOT DETECTED NOT DETECTED Final   Parainfluenza Virus 2 NOT DETECTED NOT DETECTED Final   Parainfluenza Virus 3 NOT DETECTED NOT DETECTED Final   Parainfluenza Virus 4 NOT DETECTED NOT DETECTED Final   Respiratory Syncytial Virus NOT DETECTED NOT DETECTED Final   Bordetella pertussis NOT DETECTED NOT DETECTED Final   Bordetella Parapertussis NOT DETECTED NOT DETECTED Final   Chlamydophila pneumoniae NOT DETECTED NOT DETECTED Final   Mycoplasma pneumoniae NOT DETECTED NOT DETECTED Final    Comment: Performed at [facility], [address], [place], [place] [address]         Radiology Studies: No results found.       Scheduled Meds:  acetaminophen  1,000 mg Oral Q8H   aspirin EC  325 mg Oral Daily   docusate sodium  100 mg Oral BID   gabapentin  100 mg Oral TID   insulin aspart  0-9 Units Subcutaneous Q4H   insulin aspart  6 Units Subcutaneous TID WC   insulin glargine-yfgn  55 Units Subcutaneous QHS   levothyroxine  50 mcg Oral Q0600   lisinopril  40 mg Oral Daily   methocarbamol  500 mg Oral TID   metoprolol succinate  25 mg Oral Daily   multivitamin with minerals  1 tablet Oral Daily   naproxen  500 mg Oral BID WC   pantoprazole  80 mg Oral Daily   polyethylene glycol  17 g Oral Daily   Ensure Max Protein  11 oz Oral BID   Vitamin D (Ergocalciferol)  50,000 Units Oral Q7 days   Continuous Infusions:     LOS: 4 days     Tresa Moore, MD [facility]   If 7PM-7AM, please contact night-coverage  08/31/2023, 1:51 PM

## 2023-08-31 NOTE — Progress Notes (Signed)
Inpatient Rehab Admissions Coordinator:    I continue to await insurance auth for CIR.   Kishon Garriga, MS, CCC-SLP Rehab Admissions Coordinator  [phone] (celll) [phone] (office)  

## 2023-08-31 NOTE — Progress Notes (Signed)
 Occupational Therapy Treatment Patient Details Name: Adriona Kaney [facility] MRN: [id number] DOB: [birth year] Today's Date: 08/31/2023   History of present illness Pt admitted to [facility] on 08/26/23 for c/o mechanical fall OOB that resulted in R patellar dislocation. S/p R knee arthroscopy with assisted patella reduction and lateral release. Significant PMH includes: Prader-Willi syndrome, intellectual delay, and diabetes.   OT comments  Chart reviewed to date, pt greeted in bed, anxious for mobility especially with supine>sit however improved after sitting on edge of bed. Improvements noted in STS on this date with pt performing STS in stedy 4x with CGA-MIN A. MAX A for LB dressing, MAX A for washing hair, SET UP for washing face. Pt tolerated sitting on edge of bed for at least 25 minutes prior to STS attempts. Pt is making progress towards goals however continues to perform ADL/functional mobility significantly below PLOF, discharge recommendation remains appropriate. OT will continue to follow       If plan is discharge home, recommend the following:  Two people to help with walking and/or transfers;Two people to help with bathing/dressing/bathroom;Assist for transportation;Help with stairs or ramp for entrance;Supervision due to cognitive status;Assistance with cooking/housework   Equipment Recommendations  Other (comment) (defer)    Recommendations for Other Services      Precautions / Restrictions Precautions Precautions: Knee Recall of Precautions/Restrictions: Impaired Required Braces or Orthoses: Knee Immobilizer - Right Knee Immobilizer - Right: On when out of bed or walking Restrictions Weight Bearing Restrictions Per Provider Order: Yes RLE Weight Bearing Per Provider Order: Weight bearing as tolerated       Mobility Bed Mobility Overal bed mobility: Needs Assistance Bed Mobility: Sit to Supine, Supine to Sit     Supine to sit: Max assist, +2 for physical assistance Sit to  supine: Max assist, +2 for physical assistance   General bed mobility comments: step by step multi modal cues for technique, safety, reassurance but improved tolerance with sit>supine vs supine>sit    Transfers Overall transfer level: Needs assistance Equipment used: Ambulation equipment used Transfers: Sit to/from Stand Sit to Stand: Via lift equipment           General transfer comment: pt stands in stedy 4x with MIN-CGA +1, verbal cues for techinque, maintains standing for approx 30seconds each attempt Transfer via Lift Equipment: Stedy   Balance Overall balance assessment: Needs assistance Sitting-balance support: No upper extremity supported, Feet supported Sitting balance-Leahy Scale: Good     Standing balance support: Bilateral upper extremity supported, During functional activity Standing balance-Leahy Scale: Poor                             ADL either performed or assessed with clinical judgement   ADL Overall ADL's : Needs assistance/impaired Eating/Feeding: Set up;Sitting   Grooming: Wash/dry face;Brushing hair Grooming Details (indicate cue type and reason): shampoo cap/brushing hair with MAX A for thoroughness; wash face with SET UP             Lower Body Dressing: Maximal assistance Lower Body Dressing Details (indicate cue type and reason): KI     Toileting- Clothing Manipulation and Hygiene: Maximal assistance              Extremity/Trunk Assessment              Vision       Perception     Praxis     Communication Communication Communication: No apparent difficulties   Cognition Arousal:  Alert Behavior During Therapy: WFL for tasks assessed/performed, Anxious Cognition: History of cognitive impairments                               Following commands: Intact        Cueing   Cueing Techniques: Verbal cues, Visual cues  Exercises Other Exercises Other Exercises: edu re: role of OT, importance of  participation    Shoulder Instructions       General Comments      Pertinent Vitals/ Pain       Pain Assessment Pain Assessment: Faces Faces Pain Scale: Hurts whole lot Pain Location: R knee Pain Descriptors / Indicators: Grimacing, Guarding, Moaning, Crying Pain Intervention(s): Monitored during session, Premedicated before session, Limited activity within patient's tolerance (anxious for mobility, improved with deep pressure, use of phone to show therapist pictures of her dogs)  Home Living                                          Prior Functioning/Environment              Frequency  Min 1X/week        Progress Toward Goals  OT Goals(current goals can now be found in the care plan section)  Progress towards OT goals: Progressing toward goals  Acute Rehab OT Goals Time For Goal Achievement: 09/11/23  Plan      Co-evaluation    PT/OT/SLP Co-Evaluation/Treatment: Yes Reason for Co-Treatment: Complexity of the patient's impairments (multi-system involvement);For patient/therapist safety;To address functional/ADL transfers PT goals addressed during session: Mobility/safety with mobility;Balance OT goals addressed during session: ADL's and self-care;Proper use of Adaptive equipment and DME      AM-PAC OT "6 Clicks" Daily Activity     Outcome Measure   Help from another person eating meals?: A Little Help from another person taking care of personal grooming?: A Little Help from another person toileting, which includes using toliet, bedpan, or urinal?: A Lot Help from another person bathing (including washing, rinsing, drying)?: A Lot Help from another person to put on and taking off regular upper body clothing?: A Little Help from another person to put on and taking off regular lower body clothing?: A Lot 6 Click Score: 15    End of Session Equipment Utilized During Treatment: Other (comment);Right knee immobilizer (Stedy)  OT Visit  Diagnosis: Other abnormalities of gait and mobility (R26.89)   Activity Tolerance Patient tolerated treatment well   Patient Left in bed;with call bell/phone within reach;with bed alarm set;with family/visitor present   Nurse Communication Mobility status        Time: 8657-8469 OT Time Calculation (min): 44 min  Charges: OT General Charges $OT Visit: 1 Visit OT Treatments $Self Care/Home Management : 8-22 mins  Oleta Mouse, OTD OTR/L  08/31/23, 3:58 PM

## 2023-08-31 NOTE — Plan of Care (Signed)

## 2023-08-31 NOTE — Inpatient Diabetes Management (Signed)
 Inpatient Diabetes Program Recommendations  AACE/ADA: New Consensus Statement on Inpatient Glycemic Control (2015)  Target Ranges:  Prepandial:   less than 140 mg/dL      Peak postprandial:   less than 180 mg/dL (1-2 hours)      Critically ill patients:  140 - 180 mg/dL    Latest Reference Range & Units 08/30/23 00:32 08/30/23 04:12 08/30/23 08:19 08/30/23 11:46 08/30/23 17:00 08/30/23 20:24 08/30/23 22:28  Glucose-Capillary 70 - 99 mg/dL 119 (H)  7 units Novolog  275 (H)  5 units Novolog  239 (H)  7 units Novolog  316 (H)  11 units Novolog  405 (H)  13 units Novolog  382 (H)  9 units Novolog  313 (H)    50 units Semglee  (H): Data is abnormally high  Latest Reference Range & Units 08/31/23 00:42 08/31/23 03:29 08/31/23 05:11  Glucose-Capillary 70 - 99 mg/dL 147 (H)  5 units Novolog  252 (H)  5 units Novolog  237 (H)  7 units Novolog   (H): Data is abnormally high      Home DM Meds: Lantus 50 units daily Novolog 4 units tid meal coverage Jardiance 25 mg daily Metformin 1 gm bid Ozempic 0.5 mg weekly   Current Orders: Semglee 50 units QHS Novolog 0-9 units Q4 hours Novolog 4 units tid with meals   MD- Please consider:  1. Increase Semglee to 55 units at bedtime  2. Increase Novolog Meal Coverage to 8 units TID with meals    --Will follow patient during hospitalization--  Ambrose Finland RN, MSN, CDCES Diabetes Coordinator Inpatient Glycemic Control Team Team Pager: [phone] (8a-5p)

## 2023-08-31 NOTE — Plan of Care (Signed)

## 2023-08-31 NOTE — Progress Notes (Signed)
   Subjective: 4 Days Post-Op Procedure(s) (LRB): ARTHROSCOPY KNEE (Right) Patient reports pain as mild.  Increased pain with any type of mobility.  Did ambulate to the bedside commode and chair.  Knee immobilizer is not in place.  Slow progress with PT  Patient is well, and has had no acute complaints or problems Denies any CP, SOB, ABD pain. We will continue therapy today.  Plan is to go Rehab after hospital stay.  Objective: Vital signs in last 24 hours: Temp:  [98.1 F (36.7 C)-98.7 F (37.1 C)] 98.7 F (37.1 C) (02/25 0720) Pulse Rate:  [64-89] 89 (02/25 0720) Resp:  [16-18] 16 (02/25 0720) BP: (107-119)/(49-60) 109/49 (02/25 0720) SpO2:  [96 %-98 %] 98 % (02/25 0720) Weight:  [110.8 kg] 110.8 kg (02/25 0500)  Intake/Output from previous day: 02/24 0701 - 02/25 0700 In: 0  Out: 300 [Urine:300] Intake/Output this shift: No intake/output data recorded.  No results for input(s): "HGB" in the last 72 hours.  No results for input(s): "WBC", "RBC", "HCT", "PLT" in the last 72 hours.  No results for input(s): "NA", "K", "CL", "CO2", "BUN", "CREATININE", "GLUCOSE", "CALCIUM" in the last 72 hours.  No results for input(s): "LABPT", "INR" in the last 72 hours.  EXAM General - Patient is Alert, Appropriate, and Oriented Extremity - Neurovascular intact Sensation intact distally Intact pulses distally Dorsiflexion/Plantar flexion intact Ace wrap intact Dressing - dressing C/D/I and no drainage Motor Function - intact, moving foot and toes well on exam.  Up to bedside commode.  Past Medical History:  Diagnosis Date   Diabetes mellitus without complication (HCC)     Assessment/Plan:   4 Days Post-Op Procedure(s) (LRB): ARTHROSCOPY KNEE (Right) Principal Problem:   Patellar dislocation Active Problems:   Morbid obesity (HCC)   Diabetes mellitus with neuropathy (HCC)   Type 2 diabetes mellitus without complication, with long-term current use of insulin (HCC)    Hypoxia  Estimated body mass index is 44.68 kg/m as calculated from the following:   Height as of this encounter: 5\' 2"  (1.575 m).   Weight as of this encounter: 110.8 kg. Advance diet Up with therapy, slow progress with PT. PT recommending AIR.  Pain well controlled Vital signs are stable Labs are stable CM to assist with discharge to acute inpatient rehab  Orthopedics cleared for discharge.  Awaiting rehab insurance approval.   Patient will need 2-week follow-up with [facility] orthopedics for suture removal Aspirin 325 mg daily for 2 weeks for DVT prophylaxis Weightbearing as tolerated with a knee immobilizer. Knee immobilizer may be removed while patient is at rest and patient may work on gentle knee range of motion exercises. Patient will keep Ace wrap and bulky dressing on until her 2-week postop appointment for suture removal    DVT Prophylaxis - Aspirin Weight-Bearing as tolerated to right leg   Dedra Skeens, PA-C [facility] 08/31/2023, 10:47 AM

## 2023-09-01 ENCOUNTER — Inpatient Hospital Stay (HOSPITAL_COMMUNITY)
Admission: AD | Admit: 2023-09-01 | Discharge: 2023-09-16 | DRG: 945 | Disposition: A | Discharge status: Home / Self Care | Payer: MEDICAID | Source: Other Acute Inpatient Hospital | Attending: Physical Medicine and Rehabilitation | Admitting: Physical Medicine and Rehabilitation

## 2023-09-01 DIAGNOSIS — R32 Unspecified urinary incontinence: Secondary | ICD-10-CM | POA: Diagnosis present

## 2023-09-01 DIAGNOSIS — M25561 Pain in right knee: Secondary | ICD-10-CM | POA: Diagnosis present

## 2023-09-01 DIAGNOSIS — I10 Essential (primary) hypertension: Secondary | ICD-10-CM | POA: Diagnosis present

## 2023-09-01 DIAGNOSIS — Q8711 Prader-Willi syndrome: Secondary | ICD-10-CM | POA: Diagnosis not present

## 2023-09-01 DIAGNOSIS — Z79899 Other long term (current) drug therapy: Secondary | ICD-10-CM | POA: Diagnosis not present

## 2023-09-01 DIAGNOSIS — E871 Hypo-osmolality and hyponatremia: Secondary | ICD-10-CM | POA: Diagnosis present

## 2023-09-01 DIAGNOSIS — M79672 Pain in left foot: Secondary | ICD-10-CM | POA: Diagnosis present

## 2023-09-01 DIAGNOSIS — M25552 Pain in left hip: Secondary | ICD-10-CM | POA: Diagnosis present

## 2023-09-01 DIAGNOSIS — E875 Hyperkalemia: Secondary | ICD-10-CM | POA: Diagnosis present

## 2023-09-01 DIAGNOSIS — S83004A Unspecified dislocation of right patella, initial encounter: Principal | ICD-10-CM | POA: Diagnosis present

## 2023-09-01 DIAGNOSIS — S82001D Unspecified fracture of right patella, subsequent encounter for closed fracture with routine healing: Secondary | ICD-10-CM

## 2023-09-01 DIAGNOSIS — Z7409 Other reduced mobility: Secondary | ICD-10-CM | POA: Diagnosis present

## 2023-09-01 DIAGNOSIS — R609 Edema, unspecified: Secondary | ICD-10-CM | POA: Diagnosis present

## 2023-09-01 DIAGNOSIS — W06XXXD Fall from bed, subsequent encounter: Secondary | ICD-10-CM | POA: Diagnosis present

## 2023-09-01 DIAGNOSIS — R600 Localized edema: Secondary | ICD-10-CM | POA: Diagnosis present

## 2023-09-01 DIAGNOSIS — M7989 Other specified soft tissue disorders: Secondary | ICD-10-CM | POA: Diagnosis present

## 2023-09-01 DIAGNOSIS — Z794 Long term (current) use of insulin: Secondary | ICD-10-CM

## 2023-09-01 DIAGNOSIS — G479 Sleep disorder, unspecified: Secondary | ICD-10-CM | POA: Diagnosis present

## 2023-09-01 DIAGNOSIS — Z7984 Long term (current) use of oral hypoglycemic drugs: Secondary | ICD-10-CM

## 2023-09-01 DIAGNOSIS — S83004D Unspecified dislocation of right patella, subsequent encounter: Secondary | ICD-10-CM

## 2023-09-01 DIAGNOSIS — E1165 Type 2 diabetes mellitus with hyperglycemia: Secondary | ICD-10-CM | POA: Diagnosis present

## 2023-09-01 DIAGNOSIS — R5381 Other malaise: Principal | ICD-10-CM | POA: Diagnosis present

## 2023-09-01 DIAGNOSIS — Z6841 Body Mass Index (BMI) 40.0 and over, adult: Secondary | ICD-10-CM | POA: Diagnosis not present

## 2023-09-01 DIAGNOSIS — Z79890 Hormone replacement therapy (postmenopausal): Secondary | ICD-10-CM | POA: Diagnosis not present

## 2023-09-01 DIAGNOSIS — Z7901 Long term (current) use of anticoagulants: Secondary | ICD-10-CM

## 2023-09-01 LAB — CBC WITH DIFFERENTIAL/PLATELET
Abs Immature Granulocytes: 0.06 10*3/uL (ref 0.00–0.07)
Basophils Absolute: 0 10*3/uL (ref 0.0–0.1)
Basophils Relative: 1 %
Eosinophils Absolute: 0.2 10*3/uL (ref 0.0–0.5)
Eosinophils Relative: 3 %
HCT: 39.4 % (ref 36.0–46.0)
Hemoglobin: 12.7 g/dL (ref 12.0–15.0)
Immature Granulocytes: 1 %
Lymphocytes Relative: 24 %
Lymphs Abs: 1.6 10*3/uL (ref 0.7–4.0)
MCH: 30.9 pg (ref 26.0–34.0)
MCHC: 32.2 g/dL (ref 30.0–36.0)
MCV: 95.9 fL (ref 80.0–100.0)
Monocytes Absolute: 0.9 10*3/uL (ref 0.1–1.0)
Monocytes Relative: 13 %
Neutro Abs: 4 10*3/uL (ref 1.7–7.7)
Neutrophils Relative %: 58 %
Platelets: 63 10*3/uL — ABNORMAL LOW (ref 150–400)
RBC: 4.11 MIL/uL (ref 3.87–5.11)
RDW: 14.3 % (ref 11.5–15.5)
WBC: 6.7 10*3/uL (ref 4.0–10.5)
nRBC: 0 % (ref 0.0–0.2)

## 2023-09-01 LAB — BASIC METABOLIC PANEL
Anion gap: 8 (ref 5–15)
BUN: 61 mg/dL — ABNORMAL HIGH (ref 6–20)
CO2: 26 mmol/L (ref 22–32)
Calcium: 9 mg/dL (ref 8.9–10.3)
Chloride: 98 mmol/L (ref 98–111)
Creatinine, Ser: 0.56 mg/dL (ref 0.44–1.00)
GFR, Estimated: >60 mL/min (ref >60)
Glucose, Bld: 268 mg/dL — ABNORMAL HIGH (ref 70–99)
Potassium: 5.9 mmol/L — ABNORMAL HIGH (ref 3.5–5.1)
Sodium: 132 mmol/L — ABNORMAL LOW (ref 135–145)

## 2023-09-01 LAB — GLUCOSE, CAPILLARY
Glucose-Capillary: 241 mg/dL — ABNORMAL HIGH (ref 70–99)
Glucose-Capillary: 244 mg/dL — ABNORMAL HIGH (ref 70–99)
Glucose-Capillary: 293 mg/dL — ABNORMAL HIGH (ref 70–99)
Glucose-Capillary: 297 mg/dL — ABNORMAL HIGH (ref 70–99)
Glucose-Capillary: 329 mg/dL — ABNORMAL HIGH (ref 70–99)
Glucose-Capillary: 331 mg/dL — ABNORMAL HIGH (ref 70–99)
Glucose-Capillary: 404 mg/dL — ABNORMAL HIGH (ref 70–99)

## 2023-09-01 MED ORDER — ASPIRIN 325 MG PO TBEC
325.0000 mg | DELAYED_RELEASE_TABLET | Freq: Every day | ORAL | Status: DC
  Administered 2023-09-02 – 2023-09-10 (×9): 325 mg via ORAL
  Filled 2023-09-01 (×9): qty 1

## 2023-09-01 MED ORDER — METHOCARBAMOL 500 MG PO TABS: 500.0000 mg | ORAL_TABLET | Freq: Three times a day (TID) | ORAL | Status: DC

## 2023-09-01 MED ORDER — OXYCODONE HCL 5 MG PO TABS: 5.0000 mg | ORAL_TABLET | ORAL | Status: DC | PRN

## 2023-09-01 MED ORDER — SODIUM ZIRCONIUM CYCLOSILICATE 10 G PO PACK
10.0000 g | PACK | Freq: Once | ORAL | Status: AC
  Administered 2023-09-01: 10 g via ORAL
  Filled 2023-09-01: qty 1

## 2023-09-01 MED ORDER — ACETAMINOPHEN 325 MG PO TABS
325.0000 mg | ORAL_TABLET | ORAL | Status: DC | PRN
  Administered 2023-09-02: 650 mg via ORAL
  Filled 2023-09-01: qty 2

## 2023-09-01 MED ORDER — ENSURE MAX PROTEIN PO LIQD
11.0000 [oz_av] | Freq: Two times a day (BID) | ORAL | Status: DC
  Administered 2023-09-02 – 2023-09-16 (×29): 11 [oz_av] via ORAL

## 2023-09-01 MED ORDER — INSULIN GLARGINE-YFGN 100 UNIT/ML ~~LOC~~ SOLN
32.0000 [IU] | Freq: Two times a day (BID) | SUBCUTANEOUS | Status: DC
  Filled 2023-09-01: qty 0.32

## 2023-09-01 MED ORDER — INSULIN GLARGINE-YFGN 100 UNIT/ML ~~LOC~~ SOLN
32.0000 [IU] | Freq: Two times a day (BID) | SUBCUTANEOUS | Status: DC
  Filled 2023-09-01 (×2): qty 0.32

## 2023-09-01 MED ORDER — OXYCODONE HCL 5 MG PO TABS
5.0000 mg | ORAL_TABLET | ORAL | Status: DC | PRN
Start: 2023-09-01 — End: 2023-09-02
  Administered 2023-09-01 – 2023-09-02 (×3): 10 mg via ORAL
  Filled 2023-09-01 (×3): qty 2

## 2023-09-01 MED ORDER — INSULIN ASPART 100 UNIT/ML IJ SOLN
0.0000 [IU] | Freq: Three times a day (TID) | INTRAMUSCULAR | Status: DC
  Administered 2023-09-01 – 2023-09-02 (×2): 11 [IU] via SUBCUTANEOUS
  Administered 2023-09-02: 20 [IU] via SUBCUTANEOUS
  Administered 2023-09-02 – 2023-09-03 (×3): 11 [IU] via SUBCUTANEOUS
  Administered 2023-09-03 – 2023-09-04 (×2): 15 [IU] via SUBCUTANEOUS
  Administered 2023-09-04: 7 [IU] via SUBCUTANEOUS
  Administered 2023-09-04: 11 [IU] via SUBCUTANEOUS
  Administered 2023-09-05: 20 [IU] via SUBCUTANEOUS
  Administered 2023-09-05 – 2023-09-06 (×3): 7 [IU] via SUBCUTANEOUS
  Administered 2023-09-06: 4 [IU] via SUBCUTANEOUS
  Administered 2023-09-06: 11 [IU] via SUBCUTANEOUS
  Administered 2023-09-07: 7 [IU] via SUBCUTANEOUS
  Administered 2023-09-07 (×2): 11 [IU] via SUBCUTANEOUS
  Administered 2023-09-08 – 2023-09-11 (×4): 3 [IU] via SUBCUTANEOUS
  Administered 2023-09-11: 4 [IU] via SUBCUTANEOUS
  Administered 2023-09-12 (×3): 3 [IU] via SUBCUTANEOUS
  Administered 2023-09-13 – 2023-09-14 (×2): 4 [IU] via SUBCUTANEOUS
  Administered 2023-09-14: 3 [IU] via SUBCUTANEOUS
  Administered 2023-09-14: 4 [IU] via SUBCUTANEOUS
  Administered 2023-09-15: 3 [IU] via SUBCUTANEOUS
  Administered 2023-09-15 (×2): 4 [IU] via SUBCUTANEOUS
  Administered 2023-09-16: 3 [IU] via SUBCUTANEOUS

## 2023-09-01 MED ORDER — POLYETHYLENE GLYCOL 3350 17 G PO PACK: 17.0000 g | PACK | Freq: Every day | ORAL | Status: AC

## 2023-09-01 MED ORDER — ONDANSETRON HCL 4 MG PO TABS: 4.0000 mg | ORAL_TABLET | Freq: Four times a day (QID) | ORAL | Status: DC | PRN

## 2023-09-01 MED ORDER — NAPROXEN 500 MG PO TABS: 500.0000 mg | ORAL_TABLET | Freq: Two times a day (BID) | ORAL | Status: DC

## 2023-09-01 MED ORDER — ASPIRIN 325 MG PO TBEC: 325.0000 mg | DELAYED_RELEASE_TABLET | Freq: Every day | ORAL | Status: DC

## 2023-09-01 MED ORDER — LISINOPRIL 20 MG PO TABS
40.0000 mg | ORAL_TABLET | Freq: Every day | ORAL | Status: DC
  Administered 2023-09-02 – 2023-09-03 (×2): 40 mg via ORAL
  Filled 2023-09-01 (×2): qty 2

## 2023-09-01 MED ORDER — BISACODYL 5 MG PO TBEC: 5.0000 mg | DELAYED_RELEASE_TABLET | Freq: Every day | ORAL | Status: DC | PRN

## 2023-09-01 MED ORDER — INSULIN GLARGINE-YFGN 100 UNIT/ML ~~LOC~~ SOLN: 32.0000 [IU] | Freq: Two times a day (BID) | SUBCUTANEOUS | Status: DC

## 2023-09-01 MED ORDER — METOPROLOL SUCCINATE ER 25 MG PO TB24
25.0000 mg | ORAL_TABLET | Freq: Every day | ORAL | Status: DC
  Administered 2023-09-02 – 2023-09-16 (×15): 25 mg via ORAL
  Filled 2023-09-01 (×15): qty 1

## 2023-09-01 MED ORDER — DOCUSATE SODIUM 100 MG PO CAPS: 100.0000 mg | ORAL_CAPSULE | Freq: Two times a day (BID) | ORAL | Status: AC

## 2023-09-01 MED ORDER — ACETAMINOPHEN 500 MG PO TABS: 1000.0000 mg | ORAL_TABLET | Freq: Three times a day (TID) | ORAL | Status: DC

## 2023-09-01 MED ORDER — NAPROXEN 250 MG PO TABS
500.0000 mg | ORAL_TABLET | Freq: Two times a day (BID) | ORAL | Status: DC
  Administered 2023-09-01 – 2023-09-10 (×18): 500 mg via ORAL
  Filled 2023-09-01 (×18): qty 2

## 2023-09-01 MED ORDER — INSULIN ASPART 100 UNIT/ML IJ SOLN
0.0000 [IU] | Freq: Three times a day (TID) | INTRAMUSCULAR | Status: DC
Start: 2023-09-01 — End: 2023-09-16

## 2023-09-01 MED ORDER — INSULIN ASPART 100 UNIT/ML IJ SOLN
10.0000 [IU] | Freq: Once | INTRAMUSCULAR | Status: AC
  Administered 2023-09-01: 10 [IU] via SUBCUTANEOUS

## 2023-09-01 MED ORDER — ONDANSETRON HCL 4 MG/2ML IJ SOLN: 4.0000 mg | Freq: Four times a day (QID) | INTRAMUSCULAR | Status: DC | PRN

## 2023-09-01 MED ORDER — DOCUSATE SODIUM 100 MG PO CAPS
100.0000 mg | ORAL_CAPSULE | Freq: Two times a day (BID) | ORAL | Status: DC
  Administered 2023-09-01 – 2023-09-16 (×28): 100 mg via ORAL
  Filled 2023-09-01 (×29): qty 1

## 2023-09-01 MED ORDER — SENNOSIDES-DOCUSATE SODIUM 8.6-50 MG PO TABS: 1.0000 | ORAL_TABLET | Freq: Every evening | ORAL | Status: DC | PRN

## 2023-09-01 MED ORDER — INSULIN GLARGINE 100 UNIT/ML ~~LOC~~ SOLN
32.0000 [IU] | Freq: Two times a day (BID) | SUBCUTANEOUS | Status: DC
  Administered 2023-09-01 – 2023-09-03 (×4): 32 [IU] via SUBCUTANEOUS
  Filled 2023-09-01 (×5): qty 0.32

## 2023-09-01 MED ORDER — PANTOPRAZOLE SODIUM 40 MG PO TBEC
80.0000 mg | DELAYED_RELEASE_TABLET | Freq: Every day | ORAL | Status: DC
Start: 2023-09-02 — End: 2023-09-16
  Administered 2023-09-02 – 2023-09-16 (×15): 80 mg via ORAL
  Filled 2023-09-01 (×17): qty 2

## 2023-09-01 MED ORDER — LEVOTHYROXINE SODIUM 50 MCG PO TABS
50.0000 ug | ORAL_TABLET | Freq: Every day | ORAL | Status: DC
  Administered 2023-09-02 – 2023-09-16 (×15): 50 ug via ORAL
  Filled 2023-09-01 (×15): qty 1

## 2023-09-01 MED ORDER — LIVING WELL WITH DIABETES BOOK - IN SPANISH
Freq: Once | Status: AC
  Filled 2023-09-01: qty 1

## 2023-09-01 MED ORDER — ADULT MULTIVITAMIN W/MINERALS CH
1.0000 | ORAL_TABLET | Freq: Every day | ORAL | Status: DC
  Administered 2023-09-02 – 2023-09-16 (×15): 1 via ORAL
  Filled 2023-09-01 (×15): qty 1

## 2023-09-01 MED ORDER — INSULIN ASPART 100 UNIT/ML IJ SOLN: 4.0000 [IU] | Freq: Three times a day (TID) | INTRAMUSCULAR | Status: DC

## 2023-09-01 MED ORDER — GABAPENTIN 100 MG PO CAPS
100.0000 mg | ORAL_CAPSULE | Freq: Three times a day (TID) | ORAL | Status: DC
  Administered 2023-09-01 – 2023-09-03 (×6): 100 mg via ORAL
  Filled 2023-09-01 (×6): qty 1

## 2023-09-01 MED ORDER — INSULIN ASPART 100 UNIT/ML IJ SOLN: 0.0000 [IU] | Freq: Three times a day (TID) | INTRAMUSCULAR | Status: DC

## 2023-09-01 MED ORDER — INSULIN ASPART 100 UNIT/ML IJ SOLN
6.0000 [IU] | Freq: Three times a day (TID) | INTRAMUSCULAR | Status: DC
  Administered 2023-09-01 – 2023-09-03 (×5): 6 [IU] via SUBCUTANEOUS

## 2023-09-01 MED ORDER — POLYVINYL ALCOHOL 1.4 % OP SOLN
1.0000 [drp] | OPHTHALMIC | Status: DC | PRN
  Filled 2023-09-01: qty 15

## 2023-09-01 MED ORDER — VITAMIN D (ERGOCALCIFEROL) 1.25 MG (50000 UNIT) PO CAPS: 50000.0000 [IU] | ORAL_CAPSULE | ORAL | Status: DC

## 2023-09-01 MED ORDER — POLYVINYL ALCOHOL 1.4 % OP SOLN: 1.0000 [drp] | OPHTHALMIC | Status: DC | PRN

## 2023-09-01 MED ORDER — DIPHENHYDRAMINE HCL 12.5 MG/5ML PO ELIX
12.5000 mg | ORAL_SOLUTION | ORAL | Status: DC | PRN
  Administered 2023-09-03: 25 mg via ORAL
  Filled 2023-09-01 (×3): qty 10

## 2023-09-01 MED ORDER — POLYETHYLENE GLYCOL 3350 17 G PO PACK
17.0000 g | PACK | Freq: Every day | ORAL | Status: DC
  Administered 2023-09-02 – 2023-09-16 (×15): 17 g via ORAL
  Filled 2023-09-01 (×15): qty 1

## 2023-09-01 MED ORDER — VITAMIN D (ERGOCALCIFEROL) 1.25 MG (50000 UNIT) PO CAPS
50000.0000 [IU] | ORAL_CAPSULE | ORAL | Status: DC
  Administered 2023-09-07 – 2023-09-14 (×2): 50000 [IU] via ORAL
  Filled 2023-09-01 (×2): qty 1

## 2023-09-01 MED ORDER — METHOCARBAMOL 500 MG PO TABS
500.0000 mg | ORAL_TABLET | Freq: Three times a day (TID) | ORAL | Status: DC
  Administered 2023-09-01 – 2023-09-02 (×3): 500 mg via ORAL
  Filled 2023-09-01 (×3): qty 1

## 2023-09-01 NOTE — Inpatient Diabetes Management (Signed)
 Inpatient Diabetes Program Recommendations  AACE/ADA: New Consensus Statement on Inpatient Glycemic Control (2015)  Target Ranges:  Prepandial:   less than 140 mg/dL      Peak postprandial:   less than 180 mg/dL (1-2 hours)      Critically ill patients:  140 - 180 mg/dL   Lab Results  Component Value Date   GLUCAP 241 (H) 09/01/2023   HGBA1C 9.5 (H) 08/26/2023    Review of Glycemic Control  Latest Reference Range & Units 08/31/23 17:03 08/31/23 18:59 08/31/23 20:00 09/01/23 00:29 09/01/23 03:44 09/01/23 07:56  Glucose-Capillary 70 - 99 mg/dL 409 (H) 811 (H) 914 (H) 329 (H) 244 (H) 241 (H)   Diabetes history: DM 2 Outpatient Diabetes medications:  Lantus 50 units daily, Novolog 4 units tid meal coverage, Jardiance 25 mg daily, Metformin 1 gm bid, Ozempic 0.5 mg weekly Current orders for Inpatient glycemic control:  Semglee 55 units daily Novolog 0-20 units q 4 hours Novolog 6 units tid with meals Inpatient Diabetes Program Recommendations:    Consider changing Semglee to 32 units bid (start this morning).  Also please consider increasing Novolog to 10 units tid with meals.  Patient was on weekly Ozempic at home prior to admission- She has likely missed dose.  May want to resume Ozemipc (non-formulary but family could bring in) to help with glycemic control.   Thanks,  Lorenza Cambridge, RN, BC-ADM Inpatient Diabetes Coordinator Pager [phone]  (8a-5p)

## 2023-09-01 NOTE — Progress Notes (Signed)
 Mobility Specialist - Progress Note   09/01/23 1100  Mobility  Activity Transferred to/from [facility]  Level of Assistance Minimal assist, patient does 75% or more  Assistive Device Stedy  Mobility visit 1 Mobility     Pt lying in bed upon arrival, utilizing RA. Pt agreeable to activity. Voiced pain in L shoulder and back. STS to stedy with minA. Transferred to [facility] for BM. Pt left on [facility] with call bell in reach, family at bedside.    Upon return, pt assisted back to bed via stedy. Voicing increased pain in RLE. Pt left in bed with alarm set, needs in reach.    Filiberto Pinks Mobility Specialist 09/01/23, 11:13 AM

## 2023-09-01 NOTE — H&P (Addendum)
 Physical Medicine and Rehabilitation Admission H&P    CC: R patellar dislocation  HPI: Linda Mason is a 28 year old right-handed morbidly obese female and BMI 44.88 as well as diabetes mellitus, Prader-Willi syndrome, intellectual delay per chart review patient lives with parents.  1 level home 5 steps to entry..  The patient does speak [place] but parents do not.  Family available as needed on discharge.  Presented 08/26/2023 after mechanical fall getting out of her bed.  No loss of consciousness.  X-rays and imaging revealed right knee irreducible patellar dislocation right knee lateral femoral condyle impacting fracture as well as right knee medial patella impaction fracture.  Initial attempts at surgery 08/26/2023 placed on hold due to patient being tachycardic oxygen saturation dropping into the 80s on room air and a low-grade fever 101.4.  CT of the chest showed no acute cardiopulmonary disease no findings suspicious for pneumonia.  CT angio of the chest showed no embolism or lung mass.  Bilateral venous Doppler study showed no signs of DVT.  Fever did subside no white count elevation mild elevation of procalcitonin.  Negative for flu/COVID/RSV.  She was cleared for surgery underwent right knee arthroscopic assisted patellar reduction with lateral release.  Right knee arthroscopic loose body removal right knee open management of lateral femoral condyle and medial patella fracture 08/27/2023 per Dr. Signa Kell.  Placed on aspirin 325 mg daily for DVT prophylaxis.  Weightbearing as tolerated right lower extremity knee immobilizer when out of bed or when walking.  Hospital course hyponatremia 132 bouts of hyperkalemia 5.9.  Therapy evaluations completed due to patient decreased functional mobility was admitted for a comprehensive rehab program. She is currently MaxA with therapy.  Review of Systems  Constitutional:  Positive for fever.  HENT:  Negative for hearing loss.   Eyes:  Negative for  blurred vision and double vision.  Respiratory:  Negative for cough, shortness of breath and wheezing.   Cardiovascular:  Positive for leg swelling. Negative for chest pain and palpitations.  Gastrointestinal:  Positive for constipation. Negative for heartburn, nausea and vomiting.  Genitourinary:  Negative for dysuria, flank pain and hematuria.  Musculoskeletal:  Positive for myalgias.  Skin:  Negative for rash.  All other systems reviewed and are negative.  Past Medical History:  Diagnosis Date   Diabetes mellitus without complication [facility])    Past Surgical History:  Procedure Laterality Date   KNEE ARTHROSCOPY Right 08/27/2023   Procedure: ARTHROSCOPY KNEE;  Surgeon: Signa Kell, MD;  Location: [facility] ORS;  Service: Orthopedics;  Laterality: Right;   No family history on file. Social History:  reports that she has never smoked. She does not have any smokeless tobacco history on file. She reports that she does not drink alcohol. No history on file for drug use. Allergies: No Known Allergies Medications Prior to Admission  Medication Sig Dispense Refill   empagliflozin (JARDIANCE) 25 MG TABS tablet Take 25 mg by mouth daily.     gabapentin (NEURONTIN) 100 MG capsule Take 1 capsule (100 mg total) by mouth 3 (three) times daily. 90 capsule 2   insulin aspart (NOVOLOG) 100 UNIT/ML injection Inject 4 Units into the skin 3 (three) times daily with meals.     insulin glargine (LANTUS) 100 UNIT/ML injection Inject 50 Units into the skin at bedtime.     levothyroxine (SYNTHROID) 50 MCG tablet Take 50 mcg by mouth daily before breakfast.     lisinopril (ZESTRIL) 40 MG tablet Take 40 mg by mouth daily.  metFORMIN (GLUCOPHAGE) 500 MG tablet Take 1,000 mg by mouth 2 (two) times daily with a meal.     metoprolol succinate (TOPROL-XL) 25 MG 24 hr tablet Take 25 mg by mouth daily.     omeprazole (PRILOSEC) 40 MG capsule Take 40 mg by mouth daily.     sulfamethoxazole-trimethoprim (BACTRIM DS)  800-160 MG tablet Take 1 tablet by mouth 2 (two) times daily. 20 tablet 0   lidocaine-prilocaine (EMLA) cream Apply 1 application. topically as needed. To affected area for pain 30 g 0    Home: Home Living Family/patient expects to be discharged to:: Private residence Living Arrangements: Parent Available Help at Discharge: Family Type of Home: House Home Access: Stairs to enter Secretary/administrator of Steps: 5 Home Layout: One level Bathroom Shower/Tub: Health visitor: Administrator Accessibility: Yes Home Equipment: None  Lives With: Spouse   Functional History: Prior Function Prior Level of Function : Independent/Modified Independent (In-person interpreter services utilized. Pt speaks English but parents do not and benefit from interpreter services 2/2 pt baseline cognition.) Mobility Comments: Independent, no device. Denies additional falls. ADLs Comments: Independent per pt/father. Family is available to provide 24/7. Assist for IADL management primarily at baseline.   Functional Status:  Mobility: Bed Mobility Overal bed mobility: Needs Assistance Bed Mobility: Sit to Supine, Supine to Sit Supine to sit: Max assist, +2 for physical assistance Sit to supine: Max assist, +2 for physical assistance General bed mobility comments: step by step multi modal cues for technique, safety, reassurance but improved tolerance with sit>supine vs supine>sit Transfers Overall transfer level: Needs assistance Equipment used: Ambulation equipment used Transfers: Sit to/from Stand Sit to Stand: Via lift equipment Bed to/from chair/wheelchair/BSC transfer type:: Via Lift equipment Squat pivot transfers: Max assist, +2 physical assistance Transfer via Lift Equipment: [employer] transfer comment: pt stands in stedy 4x with CGA, first rep minA, verbal cues for techinque, maintains standing for approx 30seconds each attempt Ambulation/Gait Ambulation/Gait assistance:  Min assist, +2 physical assistance Gait Distance (Feet): 2 Feet General Gait Details: pt declined today due to pain wanting to return to bed   ADL: ADL Overall ADL's : Needs assistance/impaired Eating/Feeding: Set up, Sitting Grooming: Wash/dry face, Brushing hair Grooming Details (indicate cue type and reason): shampoo cap/brushing hair with MAX A for thoroughness; wash face with SET UP Lower Body Dressing: Maximal assistance Lower Body Dressing Details (indicate cue type and reason): KI Toileting- Clothing Manipulation and Hygiene: Maximal assistance General ADL Comments: Significantly functionally limited by increased pain, limited cardiopulmonary status, and decreased LB Access. Requires +2 MAX A for bed mobility. +2 MIN A to STS at EOB with consistent encouragement and reassurance. MAX A for LB wash up from STS after pt noted to be soiled with urine.   Cognition: Cognition Orientation Level: Oriented to person, Oriented to place, Oriented to situation, Disoriented to time Cognition Arousal: Alert Behavior During Therapy: [facility] for tasks assessed/performed, Anxious    Physical Exam: There were no vitals taken for this visit.  Physical Exam Gen: no distress, normal appearing HEENT: oral mucosa pink and moist, NCAT Cardio: Reg rate Chest: normal effort, normal rate of breathing Abd: soft, non-distended Ext: no edema Psych: pleasant, normal affect Skin:    Comments: Right lower extremity with knee immobilizer. Moving other extremities.  Neurological:     Comments: Patient is alert and oriented x2 and answers basic questions.   Results for orders placed or performed during the hospital encounter of 08/26/23 (from the past 48 hours)  Glucose, capillary     Status: Abnormal   Collection Time: 08/30/23  5:00 PM  Result Value Ref Range   Glucose-Capillary 405 (H) 70 - 99 mg/dL    Comment: Glucose reference range applies only to samples taken after fasting for at least 8 hours.   Glucose, capillary     Status: Abnormal   Collection Time: 08/30/23  8:24 PM  Result Value Ref Range   Glucose-Capillary 382 (H) 70 - 99 mg/dL    Comment: Glucose reference range applies only to samples taken after fasting for at least 8 hours.  Glucose, capillary     Status: Abnormal   Collection Time: 08/30/23 10:28 PM  Result Value Ref Range   Glucose-Capillary 313 (H) 70 - 99 mg/dL    Comment: Glucose reference range applies only to samples taken after fasting for at least 8 hours.   Comment 1 Notify RN   Glucose, capillary     Status: Abnormal   Collection Time: 08/31/23 12:42 AM  Result Value Ref Range   Glucose-Capillary 270 (H) 70 - 99 mg/dL    Comment: Glucose reference range applies only to samples taken after fasting for at least 8 hours.   Comment 1 Notify RN   Glucose, capillary     Status: Abnormal   Collection Time: 08/31/23  3:29 AM  Result Value Ref Range   Glucose-Capillary 252 (H) 70 - 99 mg/dL    Comment: Glucose reference range applies only to samples taken after fasting for at least 8 hours.  Glucose, capillary     Status: Abnormal   Collection Time: 08/31/23  5:11 AM  Result Value Ref Range   Glucose-Capillary 237 (H) 70 - 99 mg/dL    Comment: Glucose reference range applies only to samples taken after fasting for at least 8 hours.  Glucose, capillary     Status: Abnormal   Collection Time: 08/31/23  7:21 AM  Result Value Ref Range   Glucose-Capillary 231 (H) 70 - 99 mg/dL    Comment: Glucose reference range applies only to samples taken after fasting for at least 8 hours.  Glucose, capillary     Status: Abnormal   Collection Time: 08/31/23 11:50 AM  Result Value Ref Range   Glucose-Capillary 353 (H) 70 - 99 mg/dL    Comment: Glucose reference range applies only to samples taken after fasting for at least 8 hours.  Glucose, capillary     Status: Abnormal   Collection Time: 08/31/23  5:03 PM  Result Value Ref Range   Glucose-Capillary 468 (H) 70 - 99  mg/dL    Comment: Glucose reference range applies only to samples taken after fasting for at least 8 hours.  Glucose, capillary     Status: Abnormal   Collection Time: 08/31/23  6:59 PM  Result Value Ref Range   Glucose-Capillary 388 (H) 70 - 99 mg/dL    Comment: Glucose reference range applies only to samples taken after fasting for at least 8 hours.  Glucose, capillary     Status: Abnormal   Collection Time: 08/31/23  8:00 PM  Result Value Ref Range   Glucose-Capillary 364 (H) 70 - 99 mg/dL    Comment: Glucose reference range applies only to samples taken after fasting for at least 8 hours.  Glucose, capillary     Status: Abnormal   Collection Time: 09/01/23 12:29 AM  Result Value Ref Range   Glucose-Capillary 329 (H) 70 - 99 mg/dL    Comment: Glucose reference range applies  only to samples taken after fasting for at least 8 hours.   Comment 1 Notify RN   Glucose, capillary     Status: Abnormal   Collection Time: 09/01/23  3:44 AM  Result Value Ref Range   Glucose-Capillary 244 (H) 70 - 99 mg/dL    Comment: Glucose reference range applies only to samples taken after fasting for at least 8 hours.   Comment 1 Notify RN   Glucose, capillary     Status: Abnormal   Collection Time: 09/01/23  7:56 AM  Result Value Ref Range   Glucose-Capillary 241 (H) 70 - 99 mg/dL    Comment: Glucose reference range applies only to samples taken after fasting for at least 8 hours.  Basic metabolic panel     Status: Abnormal   Collection Time: 09/01/23  9:13 AM  Result Value Ref Range   Sodium 132 (L) 135 - 145 mmol/L   Potassium 5.9 (H) 3.5 - 5.1 mmol/L   Chloride 98 98 - 111 mmol/L   CO2 26 22 - 32 mmol/L   Glucose, Bld 268 (H) 70 - 99 mg/dL    Comment: Glucose reference range applies only to samples taken after fasting for at least 8 hours.   BUN 61 (H) 6 - 20 mg/dL   Creatinine, Ser 0.98 0.44 - 1.00 mg/dL   Calcium 9.0 8.9 - 11.9 mg/dL   GFR, Estimated >14 >78 mL/min    Comment:  (NOTE) Calculated using the CKD-EPI Creatinine Equation (2021)    Anion gap 8 5 - 15    Comment: Performed at [facility], [address] [place], [place] [address]  CBC with Differential/Platelet     Status: Abnormal   Collection Time: 09/01/23  9:13 AM  Result Value Ref Range   WBC 6.7 4.0 - 10.5 K/uL   RBC 4.11 3.87 - 5.11 MIL/uL   Hemoglobin 12.7 12.0 - 15.0 g/dL   HCT 13.0 86.5 - 78.4 %   MCV 95.9 80.0 - 100.0 fL   MCH 30.9 26.0 - 34.0 pg   MCHC 32.2 30.0 - 36.0 g/dL   RDW 69.6 29.5 - 28.4 %   Platelets 63 (L) 150 - 400 K/uL    Comment: Immature Platelet Fraction may be clinically indicated, consider ordering this additional test [id number]    nRBC 0.0 0.0 - 0.2 %   Neutrophils Relative % 58 %   Neutro Abs 4.0 1.7 - 7.7 K/uL   Lymphocytes Relative 24 %   Lymphs Abs 1.6 0.7 - 4.0 K/uL   Monocytes Relative 13 %   Monocytes Absolute 0.9 0.1 - 1.0 K/uL   Eosinophils Relative 3 %   Eosinophils Absolute 0.2 0.0 - 0.5 K/uL   Basophils Relative 1 %   Basophils Absolute 0.0 0.0 - 0.1 K/uL   Immature Granulocytes 1 %   Abs Immature Granulocytes 0.06 0.00 - 0.07 K/uL    Comment: Performed at [facility], [address] [place], [place] [address]  Glucose, capillary     Status: Abnormal   Collection Time: 09/01/23 11:30 AM  Result Value Ref Range   Glucose-Capillary 293 (H) 70 - 99 mg/dL    Comment: Glucose reference range applies only to samples taken after fasting for at least 8 hours.   No results found.    There were no vitals taken for this visit.  Medical Problem List and Plan: 1. Functional deficits secondary to right knee patellar dislocation.  Status post right knee arthroscopic assisted patellar  reduction with lateral release.  Right knee arthroscopic loose body removal right knee open management of lateral femoral condyle and medial patella fracture 08/27/2023.  Weightbearing as tolerated.  Knee immobilizer when out of bed or when  ambulating.  -patient may shower  -ELOS/Goals: 10-14 days MinA/S  Admit to CIR  2.  Antithrombotics: -DVT/anticoagulation:  Mechanical: Antiembolism stockings, thigh (TED hose) Bilateral lower extremities.  Venous Dopplers negative  -antiplatelet therapy: continue Aspirin 325 mg daily x 2 weeks  3. Pain Management: continue Neurontin 100 mg 3 times daily Robaxin 500 mg 3 times daily, Naprosyn 500 mg twice daily, oxycodone as needed  4. Mood/Behavior/Sleep: Provide emotional support  -antipsychotic agents: N/A  5. Neuropsych/cognition: This patient is not capable of making decisions on her own behalf.  6. Skin/Wound Care: Routine skin checks  7. Fluids/Electrolytes/Nutrition: Routine in and outs with follow-up chemistries  8.  Prader-Willi syndrome.  Patient lives with parents independent prior to admission  9.  Diabetes mellitus.  Hemoglobin A1c 9.5.  continue NovoLog 6 units 3 times daily, Semglee 55 units nightly  10.  Hypertension.  Lisinopril 40 mg daily, Toprol-XL 25 mg daily  11.  Obesity.  BMI 44.88.  Dietary follow-up  Charlton Amor, PA-C   I have personally performed a face to face diagnostic evaluation, including, but not limited to relevant history and physical exam findings, of this patient and developed relevant assessment and plan.  Additionally, I have reviewed and concur with the physician assistant's documentation above.  Horton Chin, MD 09/01/2023

## 2023-09-01 NOTE — Discharge Summary (Signed)
 Physician Discharge Summary   Patient: Linda Mason MRN: [id number] DOB: 1995/10/24  Admit date:     08/26/2023  Discharge date: 09/01/23  Discharge Physician: Jonah Blue   PCP: [facility]   Recommendations at discharge:    You are being discharged to inpatient rehabilitation at [facility]  Discharge Diagnoses: Principal Problem:   Patellar dislocation Active Problems:   Diabetes mellitus with neuropathy (HCC)   Morbid obesity (HCC)   Type 2 diabetes mellitus without complication, with long-term current use of insulin (HCC)   Hypoxia   Hospital Course: 28yo with h/o Prader-Willi syndrome, DM, hypothyroidism, and morbid obesity who presented on 2/20 with a fall, found to have patellar dislocation s/p repair.  Breakthrough fevers delayed surgery but no cause was identified and this is resolved.  Managing hyperglycemia.  Stable for dc to CIR when possible.  Assessment and Plan:  Patellar dislocation status post knee arthroscopy Tolerated procedure well Knee immobilizer in place Aspirin 325 daily x 2 weeks Pain control, PT OT Outpatient follow-up with orthopedics in 2 weeks Will need CIR and has been accepted for today Medically appropriate for discharge   IDDM with insulin resistance, poor control Most recent A1c 11.1 in December 2024 Semglee changed to 32 units BID  Mealtime insulin 4 units 3 times daily with meals Sliding scale coverage Continue carb modified diet Recommend ongoing TRH consultation while at CIR Resume metformin and Jardiance  HTN Continue lisinopril, Toprol XL  Hyperkalemia Given a dose of Lokelma Suggest recheck tomorrow AM   Fever Unclear etiology, workup unrevealing No white count elevation, mild elevation in procalcitonin Fevers have resolved since surgery, possibly related to pain   Prader-Willi syndrome Patient with some 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 and patient was instructed, not to drive, operate heavy machinery, perform activities at heights, swimming or participation in water activities or provide baby-sitting services while on Pain, Sleep and Anxiety Medications; until their outpatient Physician has advised to do so again. Also recommended to not to take more than prescribed Pain, Sleep and Anxiety Medications.   Disposition: Rehabilitation facility Diet recommendation:  Carb modified diet DISCHARGE MEDICATION: Allergies as of 09/01/2023   No Known Allergies      Medication List     STOP taking these medications    insulin glargine 100 UNIT/ML injection Commonly known as: LANTUS   sulfamethoxazole-trimethoprim 800-160 MG tablet Commonly known as: BACTRIM DS       TAKE these medications    acetaminophen 500 MG tablet Commonly known as: TYLENOL Take 2 tablets (1,000 mg total) by mouth every 8 (eight) hours.   aspirin EC 325 MG tablet Take 1 tablet (325 mg total) by mouth daily for 14 days.   docusate sodium 100 MG capsule Commonly known as: COLACE Take 1 capsule (100 mg total) by mouth 2 (two) times daily.   gabapentin 100 MG capsule Commonly known as: Neurontin Take 1 capsule (100 mg total) by mouth 3 (  three) times daily.   insulin aspart 100 UNIT/ML injection Commonly known as: novoLOG Inject 4 Units into the skin 3 (three) times daily with meals. What changed: Another  medication with the same name was added. Make sure you understand how and when to take each.   insulin aspart 100 UNIT/ML injection Commonly known as: novoLOG Inject 0-20 Units into the skin 3 (three) times daily with meals. What changed: You were already taking a medication with the same name, and this prescription was added. Make sure you understand how and when to take each.   insulin glargine-yfgn 100 UNIT/ML injection Commonly known as: SEMGLEE Inject 0.32 mLs (32 Units total) into the skin 2 (two) times daily.   Jardiance 25 MG Tabs tablet Generic drug: empagliflozin Take 25 mg by mouth daily.   levothyroxine 50 MCG tablet Commonly known as: SYNTHROID Take 50 mcg by mouth daily before breakfast.   lidocaine-prilocaine cream Commonly known as: EMLA Apply 1 application. topically as needed. To affected area for pain   lisinopril 40 MG tablet Commonly known as: ZESTRIL Take 40 mg by mouth daily.   metFORMIN 500 MG tablet Commonly known as: GLUCOPHAGE Take 1,000 mg by mouth 2 (two) times daily with a meal.   methocarbamol 500 MG tablet Commonly known as: ROBAXIN Take 1 tablet (500 mg total) by mouth 3 (three) times daily.   metoprolol succinate 25 MG 24 hr tablet Commonly known as: TOPROL-XL Take 25 mg by mouth daily.   naproxen 500 MG tablet Commonly known as: NAPROSYN Take 1 tablet (500 mg total) by mouth 2 (two) times daily with a meal for 14 days.   omeprazole 40 MG capsule Commonly known as: PRILOSEC Take 40 mg by mouth daily.   ondansetron 4 MG tablet Commonly known as: ZOFRAN Take 1 tablet (4 mg total) by mouth every 6 (six) hours as needed for nausea.   oxyCODONE 5 MG immediate release tablet Commonly known as: Oxy IR/ROXICODONE Take 1 tablet (5 mg total) by mouth every 4 (four) hours as needed for moderate pain (pain score 4-6) (pain score 4-6).   polyethylene glycol 17 g packet Commonly known as: MIRALAX / GLYCOLAX Take 17 g by mouth daily. Start  taking on: September 02, 2023   polyvinyl alcohol 1.4 % ophthalmic solution Commonly known as: LIQUIFILM TEARS Place 1 drop into both eyes as needed for dry eyes.   Vitamin D (Ergocalciferol) 1.25 MG (50000 UNIT) Caps capsule Commonly known as: DRISDOL Take 1 capsule (50,000 Units total) by mouth every 7 (seven) days. Start taking on: September 07, 2023               Durable Medical Equipment  (From admission, onward)           Start     Ordered   08/28/23 0827  For home use only DME Dan Humphreys  Once       Question:  Patient needs a walker to treat with the following condition  Answer:  Patellar dislocation   08/28/23 9604            Follow-up Information     Dedra Skeens, PA-C Follow up in 2 week(s).   Specialty: Orthopedic Surgery Contact information: [address] [place] [address] [phone]                Discharge Exam:   Subjective: Continues to have knee pain, worried about rehab.   Objective: Vitals:   08/31/23 2245 09/01/23  0758  BP: 114/65 (!) 113/57  Pulse: 95 87  Resp: 18 16  Temp: 98 F (36.7 C) 97.9 F (36.6 C)  SpO2: 94% 100%    Intake/Output Summary (Last 24 hours) at 09/01/2023 1315 Last data filed at 09/01/2023 0900 Gross per 24 hour  Intake 600 ml  Output --  Net 600 ml   Filed Weights   08/30/23 0500 08/31/23 0500 09/01/23 0446  Weight: 109.1 kg 110.8 kg 111.3 kg    Exam:  General:  Appears calm and comfortable and is in NAD Eyes:  EOMI, normal lids, iris ENT:  grossly normal hearing, lips & tongue, mmm Neck:  no LAD, masses or thyromegaly Cardiovascular:  RRR, no m/r/g. No LE edema.  Respiratory:   CTA bilaterally with no wheezes/rales/rhonchi.  Normal respiratory effort. Abdomen:  soft, NT, ND Skin:  no rash or induration seen on limited exam Musculoskeletal:  R knee wrapped with cooling device in place Psychiatric:  anxious mood and affect, speech fluent and  appropriate, AOx3, mild cognitive slowing Neurologic:  CN 2-12 grossly intact, moves all extremities in coordinated fashion other than RLE  Data Reviewed: I have reviewed the patient's lab results since admission.  Pertinent labs for today include:   Glucose 231-388  K+ 5.9 BUN 61 Platelets 63 - stable    Condition at discharge: improving  The results of significant diagnostics from this hospitalization (including imaging, microbiology, ancillary and laboratory) are listed below for reference.   Imaging Studies: US Venous Img Lower Bilateral (DVT) Result Date: 08/27/2023 CLINICAL DATA:  Fever of unknown origin.  Right knee injury. EXAM: BILATERAL LOWER EXTREMITY VENOUS DOPPLER ULTRASOUND TECHNIQUE: Gray-scale sonography with graded compression, as well as color Doppler and duplex ultrasound were performed to evaluate the lower extremity deep venous systems from the level of the common femoral vein and including the common femoral, femoral, profunda femoral, popliteal and calf veins including the posterior tibial, peroneal and gastrocnemius veins when visible. The superficial great saphenous vein was also interrogated. Spectral Doppler was utilized to evaluate flow at rest and with distal augmentation maneuvers in the common femoral, femoral and popliteal veins. COMPARISON:  None Available. FINDINGS: RIGHT LOWER EXTREMITY Common Femoral Vein: No evidence of thrombus. Normal compressibility, respiratory phasicity and response to augmentation. Saphenofemoral Junction: No evidence of thrombus. Normal compressibility and flow on color Doppler imaging. Profunda Femoral Vein: No evidence of thrombus. Normal compressibility and flow on color Doppler imaging. Femoral Vein: No evidence of thrombus. Normal compressibility, respiratory phasicity and response to augmentation. Popliteal Vein: No evidence of thrombus. Normal compressibility, respiratory phasicity and response to augmentation. Calf Veins: No  evidence of thrombus. Normal compressibility and flow on color Doppler imaging. Superficial Great Saphenous Vein: No evidence of thrombus. Normal compressibility. Venous Reflux:  None. Other Findings:  None. LEFT LOWER EXTREMITY Common Femoral Vein: No evidence of thrombus. Normal compressibility, respiratory phasicity and response to augmentation. Saphenofemoral Junction: No evidence of thrombus. Normal compressibility and flow on color Doppler imaging. Profunda Femoral Vein: No evidence of thrombus. Normal compressibility and flow on color Doppler imaging. Femoral Vein: No evidence of thrombus. Normal compressibility, respiratory phasicity and response to augmentation. Popliteal Vein: No evidence of thrombus. Normal compressibility, respiratory phasicity and response to augmentation. Calf Veins: No evidence of thrombus. Normal compressibility and flow on color Doppler imaging. Superficial Great Saphenous Vein: No evidence of thrombus. Normal compressibility. Venous Reflux:  None. Other Findings:  None. IMPRESSION: No evidence of deep venous thrombosis in either lower extremity. Electronically Signed  By: Lupita Raider M.D.   On: 08/27/2023 16:20   DG C-Arm 1-60 Min-No Report Result Date: 08/27/2023 Fluoroscopy was utilized by the requesting physician.  No radiographic interpretation.   DG C-Arm 1-60 Min-No Report Result Date: 08/27/2023 Fluoroscopy was utilized by the requesting physician.  No radiographic interpretation.   CT Angio Chest Pulmonary Embolism (PE) W or WO Contrast Result Date: 08/27/2023 CLINICAL DATA:  Pulmonary embolism (PE) suspected, high prob. Shortness of breath. Tachycardia. EXAM: CT ANGIOGRAPHY CHEST WITH CONTRAST TECHNIQUE: Multidetector CT imaging of the chest was performed using the standard protocol during bolus administration of intravenous contrast. Multiplanar CT image reconstructions and MIPs were obtained to evaluate the vascular anatomy. RADIATION DOSE REDUCTION: This  exam was performed according to the departmental dose-optimization program which includes automated exposure control, adjustment of the mA and/or kV according to patient size and/or use of iterative reconstruction technique. CONTRAST:  75mL OMNIPAQUE IOHEXOL 350 MG/ML SOLN COMPARISON:  CT scan chest from 08/26/2023. FINDINGS: Cardiovascular: No evidence of embolism to the proximal subsegmental pulmonary artery level. Top normal cardiac size. No pericardial effusion. No aortic aneurysm. There are coronary artery calcifications, in keeping with coronary artery disease. Mediastinum/Nodes: Visualized thyroid gland appears grossly unremarkable. No solid / cystic mediastinal masses. The esophagus is nondistended precluding optimal assessment. No axillary, mediastinal or hilar lymphadenopathy by size criteria. Lungs/Pleura: The central tracheo-bronchial tree is patent. There are patchy areas of linear, plate-like atelectasis and/or scarring throughout bilateral lungs. No mass or consolidation. No pleural effusion or pneumothorax. No suspicious lung nodules. Upper Abdomen: Visualized upper abdominal viscera within normal limits. Musculoskeletal: The visualized soft tissues of the chest wall are grossly unremarkable. No suspicious osseous lesions. There are mild multilevel degenerative changes in the visualized spine. Review of the MIP images confirms the above findings. IMPRESSION: 1. No embolism to the proximal subsegmental pulmonary artery level. 2. No lung mass, consolidation, pleural effusion or pneumothorax. 3. Other nonacute observations, as described above. Electronically Signed   By: Jules Schick M.D.   On: 08/27/2023 10:33   CT CHEST WO CONTRAST Result Date: 08/26/2023 CLINICAL DATA:  Pneumonia, fever EXAM: CT CHEST WITHOUT CONTRAST TECHNIQUE: Multidetector CT imaging of the chest was performed following the standard protocol without IV contrast. RADIATION DOSE REDUCTION: This exam was performed according to  the departmental dose-optimization program which includes automated exposure control, adjustment of the mA and/or kV according to patient size and/or use of iterative reconstruction technique. COMPARISON:  Chest radiograph dated 08/26/2023 FINDINGS: Cardiovascular: The heart is top-normal in size. No pericardial effusion. No evidence of thoracic aortic aneurysm. Mild coronary atherosclerosis of the right coronary artery. Mediastinum/Nodes: No suspicious mediastinal lymphadenopathy. Lungs/Pleura: Evaluation of the lung parenchyma is constrained by respiratory motion. Within that constraint, there are no suspicious pulmonary nodules. Mild linear scarring in the bilateral lung apices. Mild linear scarring at the lateral right lung base. No focal consolidation. No pleural effusion or pneumothorax. Upper Abdomen: Visualized upper abdomen is notable for hepatic steatosis. Musculoskeletal: S shaped thoracolumbar scoliosis with degenerative changes. IMPRESSION: No acute cardiopulmonary disease. Specifically, no findings suspicious for pneumonia. Electronically Signed   By: Charline Bills M.D.   On: 08/26/2023 19:28   DG Chest 1 View Result Date: 08/26/2023 CLINICAL DATA:  Fever. EXAM: CHEST  1 VIEW COMPARISON:  07/10/2015 FINDINGS: Very low lung volumes limit assessment. Stable heart size, upper normal. Streaky opacity at the left lung base. No confluent consolidation. No pleural effusion or pneumothorax. IMPRESSION: Very low lung volumes limit assessment. Streaky  opacity at the left lung base may be atelectasis or pneumonia. Electronically Signed   By: Narda Rutherford M.D.   On: 08/26/2023 18:00   DG Knee Right Port Result Date: 08/26/2023 CLINICAL DATA:  Right knee pain. EXAM: PORTABLE RIGHT KNEE - 1-2 VIEW COMPARISON:  Radiographs and CT scan of same day. FINDINGS: Single AP image of right knee demonstrates continued lateral dislocation of patella. IMPRESSION: Continued lateral dislocation of patella.  Electronically Signed   By: Lupita Raider M.D.   On: 08/26/2023 14:52   CT Knee Right Wo Contrast Result Date: 08/26/2023 CLINICAL DATA:  Knee trauma.  Patellar dislocation. EXAM: CT OF THE RIGHT KNEE WITHOUT CONTRAST TECHNIQUE: Multidetector CT imaging of the right knee was performed according to the standard protocol. Multiplanar CT image reconstructions were also generated. RADIATION DOSE REDUCTION: This exam was performed according to the departmental dose-optimization program which includes automated exposure control, adjustment of the mA and/or kV according to patient size and/or use of iterative reconstruction technique. COMPARISON:  X-rays from earlier same day FINDINGS: Bones/Joint/Cartilage Lateral patellar dislocation evident with small to moderate joint effusion. Tiny cortical fragments medial to the dislocated patella are likely related to patellar avulsion injury and correspond to the curvilinear density seen on previous x-ray. There is an associated fracture involving the nonweightbearing cortex of the anterolateral right femoral condyle. No evidence for an acute fracture involving the tibial plateau or proximal fibula. There is some loss of joint space in the medial compartment. Ligaments Suboptimally assessed by CT. Muscles and Tendons The patellar tendon appears intact but a portion may be avulsed from the inferomedial patella. Quadriceps tendon appears intact but partial avulsion of the insertion cannot be excluded. Soft tissues Edema/hemorrhage is noted in the soft tissues about the anterior knee. IMPRESSION: 1. Lateral patellar dislocation with small to moderate joint effusion. 2. Tiny cortical fragments medial to the dislocated patella are likely related to patellar avulsion injury at the patellar tendon insertion and correspond to the curvilinear density seen on previous x-ray. 3. Associated fracture involving the nonweightbearing cortex of the anterolateral right femoral condyle. 4. The  patellar tendon appears intact but a portion may be avulsed from the medial patella. Avulsion of the medial quadriceps tendon insertion not excluded. Electronically Signed   By: Kennith Center M.D.   On: 08/26/2023 07:39   DG Hip Unilat W or Wo Pelvis 2-3 Views Right Result Date: 08/26/2023 CLINICAL DATA:  Fall.  Hip pain. EXAM: DG HIP (WITH OR WITHOUT PELVIS) 2-3V RIGHT COMPARISON:  None Available. FINDINGS: There is no evidence of hip fracture or dislocation. There is no evidence of arthropathy or other focal bone abnormality. IMPRESSION: Negative. Electronically Signed   By: Kennith Center M.D.   On: 08/26/2023 05:52   DG Knee 2 Views Right Result Date: 08/26/2023 CLINICAL DATA:  Pain.  Possible patellar dislocation. EXAM: RIGHT KNEE - 1-2 VIEW COMPARISON:  None Available. FINDINGS: Study is limited by positioning and bony demineralization. Patella appears laterally dislocated and there is probably an associated displaced fracture involving the inferior pole of the patella. Oblique lateral projections shows a curvilinear focus of mineralization anterior to the patella, indeterminate but potentially related to cortical fracture fragment. Joint effusion suspected. IMPRESSION: Patella appears laterally dislocated and there is probably an associated displaced fracture involving the inferior pole of the patella. Curvilinear focus of mineralization anterior to the patella is indeterminate. CT imaging of the knee could be used for definitive characterization as clinically warranted. Electronically Signed  By: Kennith Center M.D.   On: 08/26/2023 05:51    Microbiology: Results for orders placed or performed during the hospital encounter of 08/26/23  Culture, blood (Routine X 2) w Reflex to ID Panel     Status: None   Collection Time: 08/26/23  7:38 PM   Specimen: BLOOD  Result Value Ref Range Status   Specimen Description BLOOD BLOOD LEFT ARM  Final   Special Requests   Final    BOTTLES DRAWN AEROBIC AND  ANAEROBIC Blood Culture adequate volume   Culture   Final    NO GROWTH 5 DAYS Performed at [facility], [address] [place], [place] [address]    Report Status 08/31/2023 FINAL  Final  Culture, blood (Routine X 2) w Reflex to ID Panel     Status: None   Collection Time: 08/26/23  7:42 PM   Specimen: BLOOD  Result Value Ref Range Status   Specimen Description BLOOD BLOOD LEFT HAND  Final   Special Requests   Final    BOTTLES DRAWN AEROBIC ONLY Blood Culture results may not be optimal due to an inadequate volume of blood received in culture bottles   Culture   Final    NO GROWTH 5 DAYS Performed at [facility], [address], [place], [place] [address]    Report Status 08/31/2023 FINAL  Final  Surgical PCR screen     Status: Abnormal   Collection Time: 08/26/23  8:08 PM   Specimen: Nasal Mucosa; Nasal Swab  Result Value Ref Range Status   MRSA, PCR NEGATIVE NEGATIVE Final   Staphylococcus aureus POSITIVE (A) NEGATIVE Final    Comment: (NOTE) The Xpert SA Assay (FDA approved for NASAL specimens in patients 20 years of age and older), is one component of a comprehensive surveillance program. It is not intended to diagnose infection nor to guide or monitor treatment. Performed at [facility], [address] [place], [place] [address]   Resp panel by RT-PCR (RSV, Flu A&B, Covid) Anterior Nasal Swab     Status: None   Collection Time: 08/26/23  9:14 PM   Specimen: Anterior Nasal Swab  Result Value Ref Range Status   SARS Coronavirus 2 by RT PCR NEGATIVE NEGATIVE Final    Comment: (NOTE) SARS-CoV-2 target nucleic acids are NOT DETECTED.  The SARS-CoV-2 RNA is generally detectable in upper respiratory specimens during the acute phase of infection. The lowest concentration of SARS-CoV-2 viral copies this assay can detect is 138 copies/mL. A negative result does not preclude SARS-Cov-2 infection and should not be used as the sole basis for  treatment or other patient management decisions. A negative result may occur with  improper specimen collection/handling, submission of specimen other than nasopharyngeal swab, presence of viral mutation(s) within the areas targeted by this assay, and inadequate number of viral copies(<138 copies/mL). A negative result must be combined with clinical observations, patient history, and epidemiological information. The expected result is Negative.  Fact Sheet for Patients:  BloggerCourse.com  Fact Sheet for Healthcare Providers:  SeriousBroker.it  This test is no t yet approved or cleared by the [place] FDA and  has been authorized for detection and/or diagnosis of SARS-CoV-2 by FDA under an Emergency Use Authorization (EUA). This EUA will remain  in effect (meaning this test can be used) for the duration of the COVID-19 declaration under Section 564(b)(1) of the Act, 21 U.S.C.section 360bbb-3(b)(1), unless the authorization is terminated  or revoked sooner.  Influenza A by PCR NEGATIVE NEGATIVE Final   Influenza B by PCR NEGATIVE NEGATIVE Final    Comment: (NOTE) The Xpert Xpress SARS-CoV-2/FLU/RSV plus assay is intended as an aid in the diagnosis of influenza from Nasopharyngeal swab specimens and should not be used as a sole basis for treatment. Nasal washings and aspirates are unacceptable for Xpert Xpress SARS-CoV-2/FLU/RSV testing.  Fact Sheet for Patients: BloggerCourse.com  Fact Sheet for Healthcare Providers: SeriousBroker.it  This test is not yet approved or cleared by the [place] FDA and has been authorized for detection and/or diagnosis of SARS-CoV-2 by FDA under an Emergency Use Authorization (EUA). This EUA will remain in effect (meaning this test can be used) for the duration of the COVID-19 declaration under Section 564(b)(1) of the Act, 21  U.S.C. section 360bbb-3(b)(1), unless the authorization is terminated or revoked.     Resp Syncytial Virus by PCR NEGATIVE NEGATIVE Final    Comment: (NOTE) Fact Sheet for Patients: BloggerCourse.com  Fact Sheet for Healthcare Providers: SeriousBroker.it  This test is not yet approved or cleared by the [place] FDA and has been authorized for detection and/or diagnosis of SARS-CoV-2 by FDA under an Emergency Use Authorization (EUA). This EUA will remain in effect (meaning this test can be used) for the duration of the COVID-19 declaration under Section 564(b)(1) of the Act, 21 U.S.C. section 360bbb-3(b)(1), unless the authorization is terminated or revoked.  Performed at [facility], [address] [place], [place] [address]   Respiratory (~20 pathogens) panel by PCR     Status: None   Collection Time: 08/27/23  8:27 AM   Specimen: Nasopharyngeal Swab; Respiratory  Result Value Ref Range Status   Adenovirus NOT DETECTED NOT DETECTED Final   Coronavirus 229E NOT DETECTED NOT DETECTED Final    Comment: (NOTE) The Coronavirus on the Respiratory Panel, DOES NOT test for the novel  Coronavirus (2019 nCoV)    Coronavirus HKU1 NOT DETECTED NOT DETECTED Final   Coronavirus NL63 NOT DETECTED NOT DETECTED Final   Coronavirus OC43 NOT DETECTED NOT DETECTED Final   Metapneumovirus NOT DETECTED NOT DETECTED Final   Rhinovirus / Enterovirus NOT DETECTED NOT DETECTED Final   Influenza A NOT DETECTED NOT DETECTED Final   Influenza B NOT DETECTED NOT DETECTED Final   Parainfluenza Virus 1 NOT DETECTED NOT DETECTED Final   Parainfluenza Virus 2 NOT DETECTED NOT DETECTED Final   Parainfluenza Virus 3 NOT DETECTED NOT DETECTED Final   Parainfluenza Virus 4 NOT DETECTED NOT DETECTED Final   Respiratory Syncytial Virus NOT DETECTED NOT DETECTED Final   Bordetella pertussis NOT DETECTED NOT DETECTED Final   Bordetella  Parapertussis NOT DETECTED NOT DETECTED Final   Chlamydophila pneumoniae NOT DETECTED NOT DETECTED Final   Mycoplasma pneumoniae NOT DETECTED NOT DETECTED Final    Comment: Performed at [facility], [address], [place], [place] [address]    Labs: CBC: Recent Labs  Lab 08/26/23 1936 09/01/23 0913  WBC 9.6 6.7  NEUTROABS  --  4.0  HGB 13.9 12.7  HCT 42.8 39.4  MCV 95.1 95.9  PLT 54* 63*   Basic Metabolic Panel: Recent Labs  Lab 08/26/23 1936 08/26/23 1942 08/27/23 0650 08/28/23 0357 09/01/23 0913  NA 135  --  139 136 132*  K 4.5  --  4.5 4.4 5.9*  CL 102  --  107 103 98  CO2 21*  --  23 23 26   GLUCOSE 153*  --  168* 232* 268*  BUN  17  --  24* 23* 61*  CREATININE 0.37*  --  0.51 0.42* 0.56  CALCIUM 8.9  --  8.8* 8.4* 9.0  MG  --  1.9  --  1.9  --   PHOS  --   --   --  3.1  --    Liver Function Tests: Recent Labs  Lab 08/26/23 1936  AST 57*  ALT 74*  ALKPHOS 123  BILITOT 2.4*  PROT 6.9  ALBUMIN 3.3*   CBG: Recent Labs  Lab 08/31/23 2000 09/01/23 0029 09/01/23 0344 09/01/23 0756 09/01/23 1130  GLUCAP 364* 329* 244* 241* 293*    Discharge time spent: greater than 30 minutes.  Signed: Jonah Blue, MD [facility] 09/01/2023

## 2023-09-01 NOTE — TOC Transition Note (Signed)
 Transition of Care [facility]) - Discharge Note   Patient Details  Name: Linda Mason MRN: [id number] Date of Birth: 1995-07-25  [facility] CM/SW Contact:  Marlowe Sax, RN Phone Number: 09/01/2023, 11:56 AM   Clinical Narrative:      Patient to discharge to CIR today, [facility] to transport       Patient Goals and CMS Choice            Discharge Placement                       Discharge Plan and Services Additional resources added to the After Visit Summary for                                       Social Drivers of Health (SDOH) Interventions SDOH Screenings   Food Insecurity: No Food Insecurity (08/26/2023)  Housing: Low Risk  (08/27/2023)  Transportation Needs: No Transportation Needs (08/27/2023)  Financial Resource Strain: Unknown (04/29/2017)   Received from [facility]  Physical Activity: Unknown (04/29/2017)   Received from [facility]  Social Connections: Unknown (04/29/2017)   Received from [facility]  Stress: Unknown (04/29/2017)   Received from [facility]  Tobacco Use: Unknown (08/26/2023)     Readmission Risk Interventions     No data to display

## 2023-09-01 NOTE — PMR Pre-admission (Signed)
 PMR Admission Coordinator Pre-Admission Assessment  Patient: Linda Mason is an 28 y.o., female MRN: [id number] DOB: Aug 14, 1995 Height: 5\' 2"  (157.5 cm) Weight: 111.3 kg  Insurance Information HMO:     PPO:      PCP:      IPA:      80/20:      OTHER:  PRIMARY: [employer] Plan       Policy#: [id number] P      Subscriber: Pt CM Name: none      Phone#: [phone]     Fax#: [phone] Pre-Cert#: none      Employer:  Benefits:  Phone #:      Name: Per Wilder Glade at  [place], Pt does not need prior authorization for AIR admit.She requested d/c summary be faxed.  Eff Date: 07/07/2023- 07/05/2024 Deductible: $0 - does not have one OOP Max: $0 - does not have one CIR: 100% coverage SNF: 100% coverage Outpatient: 100% coverage Home Health: 100% coverage DME: 100% coverage Providers: in network   SECONDARY:       Policy#:      Phone#:   Artist:       Phone#:   The Data processing manager" for patients in Inpatient Rehabilitation Facilities with attached "Privacy Act Statement-Health Care Records" was provided and verbally reviewed with: N/A  Emergency Contact Information Contact Information     Name Relation Home Work Mobile   [place] Mother   [phone]      Other Contacts     Name Relation Home Work Mobile   Ortiz,Carlos Father [phone]     Montez,Victor Brother   [phone]       Current Medical History  Patient Admitting Diagnosis: Patella dislocation  History of Present Illness: Linda Mason is a 28 y.o. female w/Past medical history of Prader-Willi syndrome, intellectual delay and diabetes who presents to the [facility] Emergency Department 08/26/23 with her family after she experienced a fall after getting out of bed. Imaging showed R pattellar dislocation. She underwent R knee arthroscopy with assisted patella reduction and lateral release on 08/27/23. She was seen by PT/OT post operatively and  they recommended CIR to assist return to PLOF.     Patient's medical record from [facility] has been reviewed by the rehabilitation admission coordinator and physician.  Past Medical History  Past Medical History:  Diagnosis Date   Diabetes mellitus without complication (HCC)     Has the patient had major surgery during 100 days prior to admission? Yes  Family History   family history is not on file.  Current Medications  Current Facility-Administered Medications:    acetaminophen (TYLENOL) tablet 1,000 mg, 1,000 mg, Oral, Q8H, Signa Kell, MD, 1,000 mg at 09/01/23 6237   aspirin EC tablet 325 mg, 325 mg, Oral, Daily, Signa Kell, MD, 325 mg at 08/31/23 0930   bisacodyl (DULCOLAX) EC tablet 5 mg, 5 mg, Oral, Daily PRN, Signa Kell, MD, 5 mg at 08/31/23 6283   diphenhydrAMINE (BENADRYL) 12.5 MG/5ML elixir 12.5-25 mg, 12.5-25 mg, Oral, Q4H PRN, Signa Kell, MD   docusate sodium (COLACE) capsule 100 mg, 100 mg, Oral, BID, Signa Kell, MD, 100 mg at 08/31/23 2100   gabapentin (NEURONTIN) capsule 100 mg, 100 mg, Oral, TID, Signa Kell, MD, 100 mg at 08/31/23 2100   HYDROmorphone (DILAUDID) injection 0.2-0.4 mg, 0.2-0.4 mg, Intravenous, Q3H PRN, Signa Kell, MD   insulin aspart (novoLOG) injection 0-20 Units, 0-20 Units, Subcutaneous, Q4H, Sreenath, Jonelle Sports, MD,  7 Units at 09/01/23 0356   insulin aspart (novoLOG) injection 6 Units, 6 Units, Subcutaneous, TID WC, Sreenath, Sudheer B, MD   insulin glargine-yfgn (SEMGLEE) injection 55 Units, 55 Units, Subcutaneous, QHS, Sreenath, Sudheer B, MD, 55 Units at 08/31/23 2100   levothyroxine (SYNTHROID) tablet 50 mcg, 50 mcg, Oral, Q0600, Signa Kell, MD, 50 mcg at 09/01/23 0554   lisinopril (ZESTRIL) tablet 40 mg, 40 mg, Oral, Daily, Signa Kell, MD, 40 mg at 08/31/23 0930   methocarbamol (ROBAXIN) tablet 500 mg, 500 mg, Oral, TID, 500 mg at 08/31/23 2100 **OR** [DISCONTINUED] methocarbamol (ROBAXIN) injection 500  mg, 500 mg, Intravenous, Q6H PRN, Signa Kell, MD   metoprolol succinate (TOPROL-XL) 24 hr tablet 25 mg, 25 mg, Oral, Daily, Signa Kell, MD, 25 mg at 08/31/23 0930   multivitamin with minerals tablet 1 tablet, 1 tablet, Oral, Daily, Signa Kell, MD, 1 tablet at 08/31/23 0930   naproxen (NAPROSYN) tablet 500 mg, 500 mg, Oral, BID WC, Signa Kell, MD, 500 mg at 08/31/23 1718   ondansetron (ZOFRAN) tablet 4 mg, 4 mg, Oral, Q6H PRN **OR** ondansetron (ZOFRAN) injection 4 mg, 4 mg, Intravenous, Q6H PRN, Signa Kell, MD   oxyCODONE (Oxy IR/ROXICODONE) immediate release tablet 10-15 mg, 10-15 mg, Oral, Q4H PRN, Signa Kell, MD, 10 mg at 08/31/23 1956   oxyCODONE (Oxy IR/ROXICODONE) immediate release tablet 5-10 mg, 5-10 mg, Oral, Q4H PRN, Signa Kell, MD, 5 mg at 08/30/23 2259   pantoprazole (PROTONIX) EC tablet 80 mg, 80 mg, Oral, Daily, Signa Kell, MD, 80 mg at 08/31/23 0930   polyethylene glycol (MIRALAX / GLYCOLAX) packet 17 g, 17 g, Oral, Daily, Sreenath, Sudheer B, MD, 17 g at 08/31/23 0981   polyvinyl alcohol (LIQUIFILM TEARS) 1.4 % ophthalmic solution 1 drop, 1 drop, Both Eyes, PRN, Jawo, Modou L, NP   protein supplement (ENSURE MAX) liquid, 11 oz, Oral, BID, Signa Kell, MD, 11 oz at 08/31/23 2100   senna-docusate (Senokot-S) tablet 1 tablet, 1 tablet, Oral, QHS PRN, Signa Kell, MD   Vitamin D (Ergocalciferol) (DRISDOL) 1.25 MG (50000 UNIT) capsule 50,000 Units, 50,000 Units, Oral, Q7 days, Lolita Patella B, MD, 50,000 Units at 08/31/23 1914  Patients Current Diet:  Diet Order             Diet Carb Modified Fluid consistency: Thin  Diet effective now                   Precautions / Restrictions Precautions Precautions: Knee Precaution Booklet Issued: No Restrictions Weight Bearing Restrictions Per Provider Order: Yes RLE Weight Bearing Per Provider Order: Weight bearing as tolerated   Has the patient had 2 or more falls or a fall with injury in the past year?  Yes  Prior Activity Level Community (5-7x/wk): Pt. active in the community PTA  Prior Functional Level Self Care: Did the patient need help bathing, dressing, using the toilet or eating? Independent  Indoor Mobility: Did the patient need assistance with walking from room to room (with or without device)? Independent  Stairs: Did the patient need assistance with internal or external stairs (with or without device)? Independent  Functional Cognition: Did the patient need help planning regular tasks such as shopping or remembering to take medications? Dependent  Patient Information Are you of Hispanic, Latino/a,or Spanish origin?: Y. Patient declines to respond (proxy) What is your race?: Y. Patient declines to respond Do you need or want an interpreter to communicate with a doctor or health care staff?: 1. Yes  Patient's  Response To:  Health Literacy and Transportation Is the patient able to respond to health literacy and transportation needs?: No (proxy) Health Literacy - How often do you need to have someone help you when you read instructions, pamphlets, or other written material from your doctor or pharmacy?: Patient unable to respond In the past 12 months, has lack of transportation kept you from medical appointments or from getting medications?: No In the past 12 months, has lack of transportation kept you from meetings, work, or from getting things needed for daily living?: No  Journalist, newspaper / Equipment Home Equipment: None  Prior Device Use: Indicate devices/aids used by the patient prior to current illness, exacerbation or injury? None of the above  Current Functional Level Cognition  Orientation Level: Oriented to person, Oriented to place, Oriented to situation, Disoriented to time    Extremity Assessment (includes Sensation/Coordination)  Upper Extremity Assessment: Overall WFL for tasks assessed (No focal weakness appreciated. Able to move BUE WNL during  session. limited LB access 2/2 body habitus.)  Lower Extremity Assessment: Defer to PT evaluation, RLE deficits/detail RLE Deficits / Details: s/p R knee arthroscopy, WBAT, KI donned prior to mobility, pt very pain limited this date. LLE Deficits / Details: not formally assessed due to increased RLE pain and perseveration; observed to be at least 3+/5 as pt is able to WB through LE for transfers and gait without buckling    ADLs  Overall ADL's : Needs assistance/impaired Eating/Feeding: Set up, Sitting Grooming: Wash/dry face, Brushing hair Grooming Details (indicate cue type and reason): shampoo cap/brushing hair with MAX A for thoroughness; wash face with SET UP Lower Body Dressing: Maximal assistance Lower Body Dressing Details (indicate cue type and reason): KI Toileting- Clothing Manipulation and Hygiene: Maximal assistance General ADL Comments: Significantly functionally limited by increased pain, limited cardiopulmonary status, and decreased LB Access. Requires +2 MAX A for bed mobility. +2 MIN A to STS at EOB with consistent encouragement and reassurance. MAX A for LB wash up from STS after pt noted to be soiled with urine.    Mobility  Overal bed mobility: Needs Assistance Bed Mobility: Sit to Supine, Supine to Sit Supine to sit: Max assist, +2 for physical assistance Sit to supine: Max assist, +2 for physical assistance General bed mobility comments: step by step multi modal cues for technique, safety, reassurance but improved tolerance with sit>supine vs supine>sit    Transfers  Overall transfer level: Needs assistance Equipment used: Ambulation equipment used Transfers: Sit to/from Stand Sit to Stand: Via lift equipment Bed to/from chair/wheelchair/BSC transfer type:: Via Lift equipment Squat pivot transfers: Max assist, +2 physical assistance Transfer via Lift Equipment: [employer] transfer comment: pt stands in stedy 4x with CGA, first rep minA, verbal cues for  techinque, maintains standing for approx 30seconds each attempt    Ambulation / Gait / Stairs / Wheelchair Mobility  Ambulation/Gait Ambulation/Gait assistance: Min assist, +2 physical assistance Gait Distance (Feet): 2 Feet General Gait Details: pt declined today due to pain wanting to return to bed    Posture / Balance Dynamic Sitting Balance Sitting balance - Comments: once positioned in midline and calmer able to sit with supervision Balance Overall balance assessment: Needs assistance Sitting-balance support: No upper extremity supported, Feet supported Sitting balance-Leahy Scale: Good Sitting balance - Comments: once positioned in midline and calmer able to sit with supervision Standing balance support: Bilateral upper extremity supported, During functional activity Standing balance-Leahy Scale: Poor Standing balance comment: reliant on support  Special needs/care consideration Special service needs knee immobilizer, Diabetic management    Previous Home Environment (from acute therapy documentation) Living Arrangements: Parent  Lives With: Spouse Available Help at Discharge: Family Type of Home: House Home Layout: One level Home Access: Stairs to enter Secretary/administrator of Steps: 5 Bathroom Shower/Tub: Health visitor: Standard Bathroom Accessibility: Yes How Accessible: Accessible via walker Home Care Services: No  Discharge Living Setting Plans for Discharge Living Setting: Mobile Home Type of Home at Discharge: House Discharge Home Layout: One level Discharge Home Access: Stairs to enter Entrance Stairs-Rails: Right Entrance Stairs-Number of Steps: 5 Discharge Bathroom Shower/Tub: Walk-in shower Discharge Bathroom Toilet: Standard Discharge Bathroom Accessibility: Yes How Accessible: Accessible via walker Does the patient have any problems obtaining your medications?: No  Social/Family/Support Systems Patient Roles: Parent Contact  Information: [phone] Anticipated Caregiver: Darral Dash Ability/Limitations of Caregiver: 24/7 Min-mod A Caregiver Availability: 24/7 Discharge Plan Discussed with Primary Caregiver: Yes Is Caregiver In Agreement with Plan?: Yes Does Caregiver/Family have Issues with Lodging/Transportation while Pt is in Rehab?: No  Goals Patient/Family Goal for Rehab: PT/OT Min A Expected length of stay: 16-18 days Pt/Family Agrees to Admission and willing to participate: Yes Program Orientation Provided & Reviewed with Pt/Caregiver Including Roles  & Responsibilities: Yes  Decrease burden of Care through IP rehab admission: not anticipated  Possible need for SNF placement upon discharge: not anticipated  Patient Condition: I have reviewed medical records from [facility] , spoken with CM, and family member. I met with patient at the bedside for inpatient rehabilitation assessment.  Patient will benefit from ongoing PT and OT, can actively participate in 3 hours of therapy a day 5 days of the week, and can make measurable gains during the admission.  Patient will also benefit from the coordinated team approach during an Inpatient Acute Rehabilitation admission.  The patient will receive intensive therapy as well as Rehabilitation physician, nursing, social worker, and care management interventions.  Due to safety, skin/wound care, disease management, medication administration, pain management, and patient education the patient requires 24 hour a day rehabilitation nursing.  The patient is currently min-max A  with mobility and basic ADLs.  Discharge setting and therapy post discharge at home with home health is anticipated.  Patient has agreed to participate in the Acute Inpatient Rehabilitation Program and will admit today.  Preadmission Screen Completed By:  Jeronimo Greaves, 09/01/2023 6:22 AM ______________________________________________________________________   Discussed status with  Dr. Carlis Abbott on 09/01/23 at 900 and received approval for admission today.  Admission Coordinator:  Jeronimo Greaves, CCC-SLP, time 1147/Date 09/01/23   Assessment/Plan: Diagnosis: Patellar dislocation Does the need for close, 24 hr/day Medical supervision in concert with the patient's rehab needs make it unreasonable for this patient to be served in a less intensive setting? Yes Co-Morbidities requiring supervision/potential complications: morbid obesity, Prader-Willi syndrome, IDDM with insulin resistance, thyroidism, mechanical fall, right knee pain Due to bladder management, bowel management, safety, skin/wound care, disease management, medication administration, pain management, and patient education, does the patient require 24 hr/day rehab nursing? Yes Does the patient require coordinated care of a physician, rehab nurse, PT, OT to address physical and functional deficits in the context of the above medical diagnosis(es)? Yes Addressing deficits in the following areas: balance, endurance, locomotion, strength, transferring, bowel/bladder control, bathing, dressing, feeding, grooming, toileting, and psychosocial support Can the patient actively participate in an intensive therapy program of at least 3 hrs of therapy 5 days a week? Yes The  potential for patient to make measurable gains while on inpatient rehab is excellent Anticipated functional outcomes upon discharge from inpatient rehab: min assist PT, min assist OT, min assist SLP Estimated rehab length of stay to reach the above functional goals is: 10-14 days Anticipated discharge destination: Home 10. Overall Rehab/Functional Prognosis: excellent   MD Signature: Sula Soda, MD

## 2023-09-01 NOTE — Plan of Care (Signed)

## 2023-09-01 NOTE — Hospital Course (Signed)
 28yo with h/o Prader-Willi syndrome, DM, hypothyroidism, and morbid obesity who presented on 2/20 with a fall, found to have patellar dislocation s/p repair.  Breakthrough fevers delayed surgery but no cause was identified and this is resolved.  Managing hyperglycemia.  Stable for dc to CIR when possible.

## 2023-09-01 NOTE — Plan of Care (Signed)

## 2023-09-01 NOTE — Progress Notes (Signed)
 Patient arrived and oriented to unit to the level of her comprehension. Unable to roll patient on her side to examine skin on backside because each attempt patient cried uncontrollably. Will defer until her mom or other family member present to help her accept process.

## 2023-09-01 NOTE — Progress Notes (Addendum)
 Inpatient Rehabilitation Admission Medication Review by a Pharmacist  A complete drug regimen review was completed for this patient to identify any potential clinically significant medication issues.  High Risk Drug Classes Is patient taking? Indication by Medication  Antipsychotic No   Anticoagulant No   Antibiotic No   Opioid Yes PRN Oxycodone - moderate or severe pain  Antiplatelet Yes Aspirin 325 mg x 14 days - VTE prophylaxis s/p patellar surgery  Hypoglycemics/insulin Yes Insulin aspart and glargine - DM Type 2  Vasoactive Medication Yes Lisinopril, Metoprolol XL - hypertension  Chemotherapy No   Other Yes Gabapentin - neuropathic pain Levothyroxine - hypothyroidism Methocarbamol - muscle relaxer Naproxen - pain control Pantoprazole - GERD Vitamin D weekly (begun 2/25), Multivitamin - supplements Docusate, Miralax - laxatives  PRNs: Acetaminophen - mild pain Diphenhydramine - itching Ondansetron - nausea, vomiting Artificial Tears - dry eyes Bisacodyl, senna-docusate - constipation     Type of Medication Issue Identified Description of Issue Recommendation(s)  Drug Interaction(s) (clinically significant)     Duplicate Therapy     Allergy     No Medication Administration End Date  Aspirin 325 mg planned x 14 days per Ortho Begun 08/26/23 at [facility], but anticipate 14 days from today.   Incorrect Dose     Additional Drug Therapy Needed     Significant med changes from prior encounter (inform family/care partners about these prior to discharge). New Aspirin x 14 days, Naproxen, methocarbamol, laxatives,  Communicate changes with patient/family prior to discharge.  Other  DC summary indicates plan to resume:  - Jardiance and Metformin   Using Pantoprazole 80 mg daily for PTA Omeprazole 40 mg daily. Insulin coverage for norw.  Resume oral meds during CIR admit or at discharge.  Resume Omeprazole at discharge    Clinically significant medication issues were  identified that warrant physician communication and completion of prescribed/recommended actions by midnight of the next day:  Yes  Name of provider notified for urgent issues identified: Dr. Carlis Abbott  Provider Method of Notification: secure chat  Pharmacist comments:  - Inpatient insulin orders were modified after orders were signed and held for transfer to CIR, which removed orders from CIR pending orders. SSI, meal coverage and Semglee have now been reordered per Dr. Carlis Abbott.   - Aspirin 325 mg daily planned x 14 days > stop date to be added by discharge, ELOS 10-14 days.  - Anticipate short course of scheduled Naproxen.   Time spent performing this drug regimen review (minutes):  [address]   Dennie Fetters, [place] 09/01/2023 3:55 PM

## 2023-09-01 NOTE — Progress Notes (Signed)
 PMR Admission Coordinator Pre-Admission Assessment   Patient: Linda Mason is an 28 y.o., female MRN: [id number] DOB: 07-Aug-1995 Height: 5\' 2"  (157.5 cm) Weight: 111.3 kg   Insurance Information HMO:     PPO:      PCP:      IPA:      80/20:      OTHER:  PRIMARY: [employer]       Policy#: [id number] P      Subscriber: Pt CM Name: none      Phone#: [phone]     Fax#: [phone] Pre-Cert#: none      Employer:  Benefits:  Phone #:      Name: Per Wilder Glade at  [facility], Pt does not need prior authorization for AIR admit.She requested d/c summary be faxed.  Eff Date: 07/07/2023- 07/05/2024 Deductible: $0 - does not have one OOP Max: $0 - does not have one CIR: 100% coverage SNF: 100% coverage Outpatient: 100% coverage Home Health: 100% coverage DME: 100% coverage Providers: in network   SECONDARY:       Policy#:      Phone#:    Artist:       Phone#:    The Data processing manager" for patients in Inpatient Rehabilitation Facilities with attached "Privacy Act Statement-Health Care Records" was provided and verbally reviewed with: N/A   Emergency Contact Information Contact Information       Name Relation Home Work Mobile    Bartley Mother     [phone]         Other Contacts       Name Relation Home Work Mobile    Ortiz,Carlos Father [phone]        Montez,Victor Brother     [phone]           Current Medical History  Patient Admitting Diagnosis: Patella dislocation  History of Present Illness: Linda Mason is a 28 y.o. female w/Past medical history of Prader-Willi syndrome, intellectual delay and diabetes who presents to the [facility] Emergency Department 08/26/23 with her family after she experienced a fall after getting out of bed. Imaging showed R pattellar dislocation. She underwent R knee arthroscopy with assisted patella reduction and lateral release on 08/27/23. She was seen by  PT/OT post operatively and they recommended CIR to assist return to PLOF.    Patient's medical record from [facility] has been reviewed by the rehabilitation admission coordinator and physician.   Past Medical History      Past Medical History:  Diagnosis Date   Diabetes mellitus without complication (HCC)            Has the patient had major surgery during 100 days prior to admission? Yes   Family History   family history is not on file.   Current Medications  Current Medications    Current Facility-Administered Medications:    acetaminophen (TYLENOL) tablet 1,000 mg, 1,000 mg, Oral, Q8H, Signa Kell, MD, 1,000 mg at 09/01/23 0630   aspirin EC tablet 325 mg, 325 mg, Oral, Daily, Signa Kell, MD, 325 mg at 08/31/23 0930   bisacodyl (DULCOLAX) EC tablet 5 mg, 5 mg, Oral, Daily PRN, Signa Kell, MD, 5 mg at 08/31/23 1601   diphenhydrAMINE (BENADRYL) 12.5 MG/5ML elixir 12.5-25 mg, 12.5-25 mg, Oral, Q4H PRN, Signa Kell, MD   docusate sodium (COLACE) capsule 100 mg, 100 mg, Oral, BID, Signa Kell, MD, 100 mg at 08/31/23 2100   gabapentin (NEURONTIN) capsule 100 mg,  100 mg, Oral, TID, Signa Kell, MD, 100 mg at 08/31/23 2100   HYDROmorphone (DILAUDID) injection 0.2-0.4 mg, 0.2-0.4 mg, Intravenous, Q3H PRN, Signa Kell, MD   insulin aspart (novoLOG) injection 0-20 Units, 0-20 Units, Subcutaneous, Q4H, Sreenath, Sudheer B, MD, 7 Units at 09/01/23 0356   insulin aspart (novoLOG) injection 6 Units, 6 Units, Subcutaneous, TID WC, Sreenath, Sudheer B, MD   insulin glargine-yfgn (SEMGLEE) injection 55 Units, 55 Units, Subcutaneous, QHS, Sreenath, Sudheer B, MD, 55 Units at 08/31/23 2100   levothyroxine (SYNTHROID) tablet 50 mcg, 50 mcg, Oral, Q0600, Signa Kell, MD, 50 mcg at 09/01/23 0554   lisinopril (ZESTRIL) tablet 40 mg, 40 mg, Oral, Daily, Signa Kell, MD, 40 mg at 08/31/23 0930   methocarbamol (ROBAXIN) tablet 500 mg, 500 mg, Oral, TID, 500 mg at 08/31/23  2100 **OR** [DISCONTINUED] methocarbamol (ROBAXIN) injection 500 mg, 500 mg, Intravenous, Q6H PRN, Signa Kell, MD   metoprolol succinate (TOPROL-XL) 24 hr tablet 25 mg, 25 mg, Oral, Daily, Signa Kell, MD, 25 mg at 08/31/23 0930   multivitamin with minerals tablet 1 tablet, 1 tablet, Oral, Daily, Signa Kell, MD, 1 tablet at 08/31/23 0930   naproxen (NAPROSYN) tablet 500 mg, 500 mg, Oral, BID WC, Signa Kell, MD, 500 mg at 08/31/23 1718   ondansetron (ZOFRAN) tablet 4 mg, 4 mg, Oral, Q6H PRN **OR** ondansetron (ZOFRAN) injection 4 mg, 4 mg, Intravenous, Q6H PRN, Signa Kell, MD   oxyCODONE (Oxy IR/ROXICODONE) immediate release tablet 10-15 mg, 10-15 mg, Oral, Q4H PRN, Signa Kell, MD, 10 mg at 08/31/23 1956   oxyCODONE (Oxy IR/ROXICODONE) immediate release tablet 5-10 mg, 5-10 mg, Oral, Q4H PRN, Signa Kell, MD, 5 mg at 08/30/23 2259   pantoprazole (PROTONIX) EC tablet 80 mg, 80 mg, Oral, Daily, Signa Kell, MD, 80 mg at 08/31/23 0930   polyethylene glycol (MIRALAX / GLYCOLAX) packet 17 g, 17 g, Oral, Daily, Sreenath, Sudheer B, MD, 17 g at 08/31/23 1610   polyvinyl alcohol (LIQUIFILM TEARS) 1.4 % ophthalmic solution 1 drop, 1 drop, Both Eyes, PRN, Jawo, Modou L, NP   protein supplement (ENSURE MAX) liquid, 11 oz, Oral, BID, Signa Kell, MD, 11 oz at 08/31/23 2100   senna-docusate (Senokot-S) tablet 1 tablet, 1 tablet, Oral, QHS PRN, Signa Kell, MD   Vitamin D (Ergocalciferol) (DRISDOL) 1.25 MG (50000 UNIT) capsule 50,000 Units, 50,000 Units, Oral, Q7 days, Linda Patella B, MD, 50,000 Units at 08/31/23 9604     Patients Current Diet:  Diet Order                  Diet Carb Modified Fluid consistency: Thin  Diet effective now                         Precautions / Restrictions Precautions Precautions: Knee Precaution Booklet Issued: No Restrictions Weight Bearing Restrictions Per Provider Order: Yes RLE Weight Bearing Per Provider Order: Weight bearing as tolerated     Has the patient had 2 or more falls or a fall with injury in the past year? Yes   Prior Activity Level Community (5-7x/wk): Pt. active in the community PTA   Prior Functional Level Self Care: Did the patient need help bathing, dressing, using the toilet or eating? Independent   Indoor Mobility: Did the patient need assistance with walking from room to room (with or without device)? Independent   Stairs: Did the patient need assistance with internal or external stairs (with or without device)? Independent   Functional  Cognition: Did the patient need help planning regular tasks such as shopping or remembering to take medications? Dependent   Patient Information Are you of Hispanic, Latino/a,or Spanish origin?: Y. Patient declines to respond (proxy) What is your race?: Y. Patient declines to respond Do you need or want an interpreter to communicate with a doctor or health care staff?: 1. Yes   Patient's Response To:  Health Literacy and Transportation Is the patient able to respond to health literacy and transportation needs?: No (proxy) Health Literacy - How often do you need to have someone help you when you read instructions, pamphlets, or other written material from your doctor or pharmacy?: Patient unable to respond In the past 12 months, has lack of transportation kept you from medical appointments or from getting medications?: No In the past 12 months, has lack of transportation kept you from meetings, work, or from getting things needed for daily living?: No   Journalist, newspaper / Equipment Home Equipment: None   Prior Device Use: Indicate devices/aids used by the patient prior to current illness, exacerbation or injury? None of the above   Current Functional Level Cognition   Orientation Level: Oriented to person, Oriented to place, Oriented to situation, Disoriented to time    Extremity Assessment (includes Sensation/Coordination)   Upper Extremity Assessment:  Overall WFL for tasks assessed (No focal weakness appreciated. Able to move BUE WNL during session. limited LB access 2/2 body habitus.)  Lower Extremity Assessment: Defer to PT evaluation, RLE deficits/detail RLE Deficits / Details: s/p R knee arthroscopy, WBAT, KI donned prior to mobility, pt very pain limited this date. LLE Deficits / Details: not formally assessed due to increased RLE pain and perseveration; observed to be at least 3+/5 as pt is able to WB through LE for transfers and gait without buckling     ADLs   Overall ADL's : Needs assistance/impaired Eating/Feeding: Set up, Sitting Grooming: Wash/dry face, Brushing hair Grooming Details (indicate cue type and reason): shampoo cap/brushing hair with MAX A for thoroughness; wash face with SET UP Lower Body Dressing: Maximal assistance Lower Body Dressing Details (indicate cue type and reason): KI Toileting- Clothing Manipulation and Hygiene: Maximal assistance General ADL Comments: Significantly functionally limited by increased pain, limited cardiopulmonary status, and decreased LB Access. Requires +2 MAX A for bed mobility. +2 MIN A to STS at EOB with consistent encouragement and reassurance. MAX A for LB wash up from STS after pt noted to be soiled with urine.     Mobility   Overal bed mobility: Needs Assistance Bed Mobility: Sit to Supine, Supine to Sit Supine to sit: Max assist, +2 for physical assistance Sit to supine: Max assist, +2 for physical assistance General bed mobility comments: step by step multi modal cues for technique, safety, reassurance but improved tolerance with sit>supine vs supine>sit     Transfers   Overall transfer level: Needs assistance Equipment used: Ambulation equipment used Transfers: Sit to/from Stand Sit to Stand: Via lift equipment Bed to/from chair/wheelchair/BSC transfer type:: Via Lift equipment Squat pivot transfers: Max assist, +2 physical assistance Transfer via Lift Equipment:  [employer] transfer comment: pt stands in stedy 4x with CGA, first rep minA, verbal cues for techinque, maintains standing for approx 30seconds each attempt     Ambulation / Gait / Stairs / Wheelchair Mobility   Ambulation/Gait Ambulation/Gait assistance: Min assist, +2 physical assistance Gait Distance (Feet): 2 Feet General Gait Details: pt declined today due to pain wanting to return to bed  Posture / Balance Dynamic Sitting Balance Sitting balance - Comments: once positioned in midline and calmer able to sit with supervision Balance Overall balance assessment: Needs assistance Sitting-balance support: No upper extremity supported, Feet supported Sitting balance-Leahy Scale: Good Sitting balance - Comments: once positioned in midline and calmer able to sit with supervision Standing balance support: Bilateral upper extremity supported, During functional activity Standing balance-Leahy Scale: Poor Standing balance comment: reliant on support     Special needs/care consideration Special service needs knee immobilizer, Diabetic management     Previous Home Environment (from acute therapy documentation) Living Arrangements: Parent  Lives With: Spouse Available Help at Discharge: Family Type of Home: House Home Layout: One level Home Access: Stairs to enter Secretary/administrator of Steps: 5 Bathroom Shower/Tub: Health visitor: Standard Bathroom Accessibility: Yes How Accessible: Accessible via walker Home Care Services: No   Discharge Living Setting Plans for Discharge Living Setting: Mobile Home Type of Home at Discharge: House Discharge Home Layout: One level Discharge Home Access: Stairs to enter Entrance Stairs-Rails: Right Entrance Stairs-Number of Steps: 5 Discharge Bathroom Shower/Tub: Walk-in shower Discharge Bathroom Toilet: Standard Discharge Bathroom Accessibility: Yes How Accessible: Accessible via walker Does the patient have any  problems obtaining your medications?: No   Social/Family/Support Systems Patient Roles: Parent Contact Information: [phone] Anticipated Caregiver: Darral Dash Ability/Limitations of Caregiver: 24/7 Min-mod A Caregiver Availability: 24/7 Discharge Plan Discussed with Primary Caregiver: Yes Is Caregiver In Agreement with Plan?: Yes Does Caregiver/Family have Issues with Lodging/Transportation while Pt is in Rehab?: No   Goals Patient/Family Goal for Rehab: PT/OT Min A Expected length of stay: 16-18 days Pt/Family Agrees to Admission and willing to participate: Yes Program Orientation Provided & Reviewed with Pt/Caregiver Including Roles  & Responsibilities: Yes   Decrease burden of Care through IP rehab admission: not anticipated   Possible need for SNF placement upon discharge: not anticipated   Patient Condition: I have reviewed medical records from [facility] , spoken with CM, and family member. I met with patient at the bedside for inpatient rehabilitation assessment.  Patient will benefit from ongoing PT and OT, can actively participate in 3 hours of therapy a day 5 days of the week, and can make measurable gains during the admission.  Patient will also benefit from the coordinated team approach during an Inpatient Acute Rehabilitation admission.  The patient will receive intensive therapy as well as Rehabilitation physician, nursing, social worker, and care management interventions.  Due to safety, skin/wound care, disease management, medication administration, pain management, and patient education the patient requires 24 hour a day rehabilitation nursing.  The patient is currently min-max A  with mobility and basic ADLs.  Discharge setting and therapy post discharge at home with home health is anticipated.  Patient has agreed to participate in the Acute Inpatient Rehabilitation Program and will admit today.   Preadmission Screen Completed By:  Jeronimo Greaves,  09/01/2023 6:22 AM ______________________________________________________________________   Discussed status with Dr. Carlis Abbott on 09/01/23 at 900 and received approval for admission today.   Admission Coordinator:  Jeronimo Greaves, CCC-SLP, time 1147/Date 09/01/23    Assessment/Plan: Diagnosis: Patellar dislocation Does the need for close, 24 hr/day Medical supervision in concert with the patient's rehab needs make it unreasonable for this patient to be served in a less intensive setting? Yes Co-Morbidities requiring supervision/potential complications: morbid obesity, Prader-Willi syndrome, IDDM with insulin resistance, thyroidism, mechanical fall, right knee pain Due to bladder management, bowel management, safety, skin/wound care, disease management,  medication administration, pain management, and patient education, does the patient require 24 hr/day rehab nursing? Yes Does the patient require coordinated care of a physician, rehab nurse, PT, OT to address physical and functional deficits in the context of the above medical diagnosis(es)? Yes Addressing deficits in the following areas: balance, endurance, locomotion, strength, transferring, bowel/bladder control, bathing, dressing, feeding, grooming, toileting, and psychosocial support Can the patient actively participate in an intensive therapy program of at least 3 hrs of therapy 5 days a week? Yes The potential for patient to make measurable gains while on inpatient rehab is excellent Anticipated functional outcomes upon discharge from inpatient rehab: min assist PT, min assist OT, min assist SLP Estimated rehab length of stay to reach the above functional goals is: 10-14 days Anticipated discharge destination: Home 10. Overall Rehab/Functional Prognosis: excellent     MD Signature: Sula Soda, MD

## 2023-09-02 ENCOUNTER — Encounter (HOSPITAL_COMMUNITY): Payer: Self-pay | Admitting: Physical Medicine and Rehabilitation

## 2023-09-02 ENCOUNTER — Other Ambulatory Visit: Payer: Self-pay

## 2023-09-02 DIAGNOSIS — S83004A Unspecified dislocation of right patella, initial encounter: Secondary | ICD-10-CM | POA: Diagnosis not present

## 2023-09-02 LAB — CBC WITH DIFFERENTIAL/PLATELET
Abs Immature Granulocytes: 0.08 K/uL — ABNORMAL HIGH (ref 0.00–0.07)
Basophils Absolute: 0 K/uL (ref 0.0–0.1)
Basophils Relative: 1 %
Eosinophils Absolute: 0.1 K/uL (ref 0.0–0.5)
Eosinophils Relative: 2 %
HCT: 35.2 % — ABNORMAL LOW (ref 36.0–46.0)
Hemoglobin: 11.5 g/dL — ABNORMAL LOW (ref 12.0–15.0)
Immature Granulocytes: 1 %
Lymphocytes Relative: 25 %
Lymphs Abs: 1.4 K/uL (ref 0.7–4.0)
MCH: 31.4 pg (ref 26.0–34.0)
MCHC: 32.7 g/dL (ref 30.0–36.0)
MCV: 96.2 fL (ref 80.0–100.0)
Monocytes Absolute: 0.9 K/uL (ref 0.1–1.0)
Monocytes Relative: 15 %
Neutro Abs: 3.1 K/uL (ref 1.7–7.7)
Neutrophils Relative %: 56 %
Platelets: 56 K/uL — ABNORMAL LOW (ref 150–400)
RBC: 3.66 MIL/uL — ABNORMAL LOW (ref 3.87–5.11)
RDW: 14.3 % (ref 11.5–15.5)
Smear Review: DECREASED
WBC: 5.5 K/uL (ref 4.0–10.5)
nRBC: 0 % (ref 0.0–0.2)

## 2023-09-02 LAB — BASIC METABOLIC PANEL
Anion gap: 9 (ref 5–15)
BUN: 27 mg/dL — ABNORMAL HIGH (ref 6–20)
CO2: 26 mmol/L (ref 22–32)
Calcium: 8.6 mg/dL — ABNORMAL LOW (ref 8.9–10.3)
Chloride: 96 mmol/L — ABNORMAL LOW (ref 98–111)
Creatinine, Ser: 0.57 mg/dL (ref 0.44–1.00)
GFR, Estimated: >60 mL/min (ref >60)
Glucose, Bld: 383 mg/dL — ABNORMAL HIGH (ref 70–99)
Potassium: 5.4 mmol/L — ABNORMAL HIGH (ref 3.5–5.1)
Sodium: 131 mmol/L — ABNORMAL LOW (ref 135–145)

## 2023-09-02 LAB — COMPREHENSIVE METABOLIC PANEL
ALT: 52 U/L — ABNORMAL HIGH (ref 0–44)
AST: 49 U/L — ABNORMAL HIGH (ref 15–41)
Albumin: 2.7 g/dL — ABNORMAL LOW (ref 3.5–5.0)
Alkaline Phosphatase: 216 U/L — ABNORMAL HIGH (ref 38–126)
Anion gap: 7 (ref 5–15)
BUN: 25 mg/dL — ABNORMAL HIGH (ref 6–20)
CO2: 28 mmol/L (ref 22–32)
Calcium: 8.7 mg/dL — ABNORMAL LOW (ref 8.9–10.3)
Chloride: 97 mmol/L — ABNORMAL LOW (ref 98–111)
Creatinine, Ser: 0.51 mg/dL (ref 0.44–1.00)
GFR, Estimated: >60 mL/min (ref >60)
Glucose, Bld: 368 mg/dL — ABNORMAL HIGH (ref 70–99)
Potassium: 5.7 mmol/L — ABNORMAL HIGH (ref 3.5–5.1)
Sodium: 132 mmol/L — ABNORMAL LOW (ref 135–145)
Total Bilirubin: 1 mg/dL (ref 0.0–1.2)
Total Protein: 6.2 g/dL — ABNORMAL LOW (ref 6.5–8.1)

## 2023-09-02 LAB — GLUCOSE, CAPILLARY
Glucose-Capillary: 305 mg/dL — ABNORMAL HIGH (ref 70–99)
Glucose-Capillary: 271 mg/dL — ABNORMAL HIGH (ref 70–99)
Glucose-Capillary: 290 mg/dL — ABNORMAL HIGH (ref 70–99)
Glucose-Capillary: 391 mg/dL — ABNORMAL HIGH (ref 70–99)
Glucose-Capillary: 400 mg/dL — ABNORMAL HIGH (ref 70–99)

## 2023-09-02 MED ORDER — OXYCODONE HCL 5 MG PO TABS
5.0000 mg | ORAL_TABLET | ORAL | Status: DC | PRN
  Administered 2023-09-02 – 2023-09-07 (×6): 5 mg via ORAL
  Filled 2023-09-02 (×6): qty 1

## 2023-09-02 MED ORDER — SODIUM CHLORIDE 0.9 % IV SOLN: INTRAVENOUS | Status: DC

## 2023-09-02 MED ORDER — METHOCARBAMOL 750 MG PO TABS
750.0000 mg | ORAL_TABLET | Freq: Three times a day (TID) | ORAL | Status: DC
  Administered 2023-09-02 – 2023-09-14 (×36): 750 mg via ORAL
  Filled 2023-09-02 (×36): qty 1

## 2023-09-02 MED ORDER — ACETAMINOPHEN 500 MG PO TABS
1000.0000 mg | ORAL_TABLET | Freq: Three times a day (TID) | ORAL | Status: DC
  Administered 2023-09-02 – 2023-09-16 (×42): 1000 mg via ORAL
  Filled 2023-09-02 (×42): qty 2

## 2023-09-02 MED ORDER — MELATONIN 5 MG PO TABS
5.0000 mg | ORAL_TABLET | Freq: Every evening | ORAL | Status: DC | PRN
  Administered 2023-09-12: 5 mg via ORAL
  Filled 2023-09-02: qty 1

## 2023-09-02 MED ORDER — OXYCODONE HCL 5 MG PO TABS
5.0000 mg | ORAL_TABLET | Freq: Three times a day (TID) | ORAL | Status: DC
  Administered 2023-09-02 – 2023-09-09 (×22): 5 mg via ORAL
  Filled 2023-09-02 (×22): qty 1

## 2023-09-02 NOTE — Plan of Care (Signed)
  Problem: Sit to Stand Goal: LTG:  Patient will perform sit to stand in prep for activites of daily living with assistance level (OT) Description: LTG:  Patient will perform sit to stand in prep for activites of daily living with assistance level (OT) Flowsheets (Taken 09/02/2023 1344) LTG: PT will perform sit to stand in prep for activites of daily living with assistance level: Contact Guard/Touching assist   Problem: RH Bathing Goal: LTG Patient will bathe all body parts with assist levels (OT) Description: LTG: Patient will bathe all body parts with assist levels (OT) Flowsheets (Taken 09/02/2023 1344) LTG: Pt will perform bathing with assistance level/cueing: Minimal Assistance - Patient > 75%   Problem: RH Dressing Goal: LTG Patient will perform upper body dressing (OT) Description: LTG Patient will perform upper body dressing with assist, with/without cues (OT). Flowsheets (Taken 09/02/2023 1344) LTG: Pt will perform upper body dressing with assistance level of: Set up assist Goal: LTG Patient will perform lower body dressing w/assist (OT) Description: LTG: Patient will perform lower body dressing with assist, with/without cues in positioning using equipment (OT) Flowsheets (Taken 09/02/2023 1344) LTG: Pt will perform lower body dressing with assistance level of: Minimal Assistance - Patient > 75%   Problem: RH Toileting Goal: LTG Patient will perform toileting task (3/3 steps) with assistance level (OT) Description: LTG: Patient will perform toileting task (3/3 steps) with assistance level (OT)  Flowsheets (Taken 09/02/2023 1344) LTG: Pt will perform toileting task (3/3 steps) with assistance level: Minimal Assistance - Patient > 75%   Problem: RH Toilet Transfers Goal: LTG Patient will perform toilet transfers w/assist (OT) Description: LTG: Patient will perform toilet transfers with assist, with/without cues using equipment (OT) Flowsheets (Taken 09/02/2023 1344) LTG: Pt will  perform toilet transfers with assistance level of: Contact Guard/Touching assist   Problem: RH Tub/Shower Transfers Goal: LTG Patient will perform tub/shower transfers w/assist (OT) Description: LTG: Patient will perform tub/shower transfers with assist, with/without cues using equipment (OT) Flowsheets (Taken 09/02/2023 1344) LTG: Pt will perform tub/shower stall transfers with assistance level of: Minimal Assistance - Patient > 75%

## 2023-09-02 NOTE — Progress Notes (Signed)
 Inpatient Rehabilitation  Patient information reviewed and entered into eRehab system by Cheri Rous, OTR/L, Rehab Quality Coordinator.   Information including medical coding, functional ability and quality indicators will be reviewed and updated through discharge.

## 2023-09-02 NOTE — Evaluation (Signed)
 Physical Therapy Assessment and Plan  Patient Details  Name: Linda Mason MRN: [id number] Date of Birth: 01-13-1996  PT Diagnosis: Cognitive deficits, Difficulty walking, Impaired cognition, Muscle weakness, and Pain in knee Rehab Potential: Good ELOS: 12-14 days   Today's Date: 09/02/2023 PT Individual Time: 0920-1015 + 1420-1536 PT Individual Time Calculation (min): 55 min + 76 min  Hospital Problem: Principal Problem:   Closed patellar dislocation, right, initial encounter   Past Medical History:  Past Medical History:  Diagnosis Date   Diabetes mellitus without complication (HCC)    Past Surgical History:  Past Surgical History:  Procedure Laterality Date   KNEE ARTHROSCOPY Right 08/27/2023   Procedure: ARTHROSCOPY KNEE;  Surgeon: Signa Kell, MD;  Location: [facility] ORS;  Service: Orthopedics;  Laterality: Right;    Assessment & Plan Clinical Impression: Patient is a 28 year old right-handed morbidly obese female and BMI 44.88 as well as diabetes mellitus, Prader-Willi syndrome, intellectual delay per chart review patient lives with parents.  1 level home 5 steps to entry..  The patient does speak [place] but parents do not.  Family available as needed on discharge.  Presented 08/26/2023 after mechanical fall getting out of her bed.  No loss of consciousness.  X-rays and imaging revealed right knee irreducible patellar dislocation right knee lateral femoral condyle impacting fracture as well as right knee medial patella impaction fracture.  Initial attempts at surgery 08/26/2023 placed on hold due to patient being tachycardic oxygen saturation dropping into the 80s on room air and a low-grade fever 101.4.  CT of the chest showed no acute cardiopulmonary disease no findings suspicious for pneumonia.  CT angio of the chest showed no embolism or lung mass.  Bilateral venous Doppler study showed no signs of DVT.  Fever did subside no white count elevation mild elevation of  procalcitonin.  Negative for flu/COVID/RSV.  She was cleared for surgery underwent right knee arthroscopic assisted patellar reduction with lateral release.  Right knee arthroscopic loose body removal right knee open management of lateral femoral condyle and medial patella fracture 08/27/2023 per Dr. Signa Kell.  Placed on aspirin 325 mg daily for DVT prophylaxis.  Weightbearing as tolerated right lower extremity knee immobilizer when out of bed or when walking.  Hospital course hyponatremia 132 bouts of hyperkalemia 5.9.  Therapy evaluations completed due to patient decreased functional mobility was admitted for a comprehensive rehab program. She is currently MaxA with therapy.   Patient currently requires max with mobility secondary to muscle weakness, decreased cardiorespiratoy endurance,  , and decreased standing balance, decreased postural control, and decreased balance strategies.  Prior to hospitalization, patient was independent  with mobility and lived with Family in a House home.  Home access is 5Stairs to enter.  Patient will benefit from skilled PT intervention to maximize safe functional mobility, minimize fall risk, and decrease caregiver burden for planned discharge home with 24 hour supervision.  Anticipate patient will benefit from follow up OP at discharge.  PT - End of Session Activity Tolerance: Tolerates 30+ min activity with multiple rests Endurance Deficit: Yes Endurance Deficit Description: rest breaks with all mobility PT Assessment Rehab Potential (ACUTE/IP ONLY): Good PT Barriers to Discharge: Inaccessible home environment;Weight;Behavior;Weight bearing restrictions PT Barriers to Discharge Comments: 5 STE with pt able to tolerate very minimal weightbearing at this time PT Patient demonstrates impairments in the following area(s): Behavior;Balance;Endurance;Motor;Nutrition;Pain;Perception;Safety;Sensory;Edema PT Transfers Functional Problem(s): Bed Mobility;Bed to  Chair;Car PT Locomotion Functional Problem(s): Ambulation;Wheelchair Mobility;Stairs PT Plan PT Intensity: Minimum of 1-2 x/day ,45  to 90 minutes PT Frequency: 5 out of 7 days PT Duration Estimated Length of Stay: 12-14 days PT Treatment/Interventions: Ambulation/gait training;Discharge planning;Functional mobility training;Psychosocial support;Therapeutic Activities;Balance/vestibular training;Disease management/prevention;Neuromuscular re-education;Therapeutic Exercise;Wheelchair propulsion/positioning;DME/adaptive equipment instruction;Cognitive remediation/compensation;Pain management;Splinting/orthotics;UE/LE Strength taining/ROM;Community reintegration;Patient/family education;Stair training;UE/LE Coordination activities PT Transfers Anticipated Outcome(s): supervision PT Locomotion Anticipated Outcome(s): supervisoin ambulatory PT Recommendation Recommendations for Other Services: None Follow Up Recommendations: Outpatient PT Patient destination: Home Equipment Recommended: To be determined   PT Evaluation Precautions/Restrictions Precautions Precautions: Knee Precaution/Restrictions Comments: Prader-willi syndrome Required Braces or Orthoses: Knee Immobilizer - Right Knee Immobilizer - Right: On when out of bed or walking Restrictions Weight Bearing Restrictions Per Provider Order: Yes RLE Weight Bearing Per Provider Order: Weight bearing as tolerated Pain Interference Pain Interference Pain Effect on Sleep: 1. Rarely or not at all Pain Interference with Therapy Activities: 2. Occasionally Pain Interference with Day-to-Day Activities: 2. Occasionally Home Living/Prior Functioning Home Living Available Help at Discharge: Family;Available 24 hours/day Type of Home: House Home Access: Stairs to enter [employer] of Steps: 5 Home Layout: One level Bathroom Shower/Tub: Door (sliding door) Insurance claims handler Accessibility: Yes Additional Comments:  Info received from pt's mother; reports that they are planning for ramp to be installed (do not have current company or plan), no prior DME.  Lives With: Family Prior Function Level of Independence: Independent with homemaking with ambulation;Independent with basic ADLs;Independent with gait  Able to Take Stairs?: Yes Driving: No Vision/Perception  Vision - History Ability to See in Adequate Light: 0 Adequate Perception Perception: Within Functional Limits Praxis Praxis: Impaired Praxis Impairment Details: Initiation;Perseveration;Motor planning  Cognition Overall Cognitive Status: History of cognitive impairments - at baseline Arousal/Alertness: Awake/alert Orientation Level: Oriented to person;Oriented to situation;Oriented to place Memory: Impaired Awareness: Impaired Problem Solving: Impaired Safety/Judgment: Impaired Sensation Sensation Light Touch: Impaired by gross assessment Hot/Cold: Not tested Proprioception: Appears Intact Stereognosis: Not tested Additional Comments: RLE impaired sensation with report of numbness/tingling Coordination Gross Motor Movements are Fluid and Coordinated: No Fine Motor Movements are Fluid and Coordinated: No Finger Nose Finger Test: difficulty following commands but [facility] Motor  Motor Motor: Other (comment);Abnormal postural alignment and control Motor - Skilled Clinical Observations: mobility limited due to R knee immobilizer and RLE pain  Trunk/Postural Assessment  Cervical Assessment Cervical Assessment: Exceptions to [facility] (decreased cervical rotation, forward head) Thoracic Assessment Thoracic Assessment: Exceptions to [facility] (rounded shoulders) Lumbar Assessment Lumbar Assessment: Exceptions to [facility] (posterior pelvic tilt) Postural Control Postural Control: Deficits on evaluation Righting Reactions: delayed and inadequate Protective Responses: decreased Postural Limitations: decreased  Balance Balance Balance Assessed:  Yes Static Sitting Balance Static Sitting - Balance Support: Feet supported Static Sitting - Level of Assistance: 4: Min assist Dynamic Sitting Balance Dynamic Sitting - Balance Support: Feet supported;Bilateral upper extremity supported;During functional activity Dynamic Sitting - Level of Assistance: 4: Min Oncologist Standing - Balance Support: Bilateral upper extremity supported Static Standing - Level of Assistance: 4: Min assist Extremity Assessment  RUE Assessment RUE Assessment: Exceptions to [facility] Active Range of Motion (AROM) Comments: Limited shoulder flexion to 90 degrees, distally [facility] General Strength Comments: 3+/5 grossly LUE Assessment LUE Assessment: Exceptions to [facility] Active Range of Motion (AROM) Comments: Limited shoulder flexion to 90 degrees, distally [facility] General Strength Comments: 3+/5 grossly RLE Assessment RLE Assessment: Exceptions to [facility] General Strength Comments: unable to test formally due to precautions and knee immobilizer LLE Assessment LLE Assessment: Exceptions to [facility] General Strength Comments: unable to formally test however demonstrates grossly 3/5 functionally  Care Tool Care Tool Bed Mobility Roll left and right activity   Roll left and right assist level: Moderate Assistance - Patient 50 - 74%    Sit to lying activity   Sit to lying assist level: Moderate Assistance - Patient 50 - 74%    Lying to sitting on side of bed activity   Lying to sitting on side of bed assist level: the ability to move from lying on the back to sitting on the side of the bed with no back support.: Moderate Assistance - Patient 50 - 74%     Care Tool Transfers Sit to stand transfer   Sit to stand assist level: 2 Helpers    Chair/bed transfer   Chair/bed transfer assist level: 2 Web designer transfer activity did not occur: Safety/medical concerns        Care Tool Locomotion Ambulation Ambulation activity did not  occur: Safety/medical concerns        Walk 10 feet activity Walk 10 feet activity did not occur: Safety/medical concerns       Walk 50 feet with 2 turns activity Walk 50 feet with 2 turns activity did not occur: Safety/medical concerns      Walk 150 feet activity Walk 150 feet activity did not occur: Safety/medical concerns      Walk 10 feet on uneven surfaces activity Walk 10 feet on uneven surfaces activity did not occur: Safety/medical concerns      Stairs Stair activity did not occur: Safety/medical concerns        Walk up/down 1 step activity Walk up/down 1 step or curb (drop down) activity did not occur: Safety/medical concerns      Walk up/down 4 steps activity Walk up/down 4 steps activity did not occur: Safety/medical concerns      Walk up/down 12 steps activity Walk up/down 12 steps activity did not occur: Safety/medical concerns      Pick up small objects from floor Pick up small object from the floor (from standing position) activity did not occur: Safety/medical concerns      Wheelchair Is the patient using a wheelchair?: Yes Type of Wheelchair: Manual   Wheelchair assist level: Dependent - Patient 0%    Wheel 50 feet with 2 turns activity   Assist Level: Dependent - Patient 0%  Wheel 150 feet activity   Assist Level: Dependent - Patient 0%    Refer to Care Plan for Long Term Goals  SHORT TERM GOAL WEEK 1 PT Short Term Goal 1 (Week 1): Pt will complete bed mobility with CGA PT Short Term Goal 2 (Week 1): Pt will complete transfer with LRAD with min assist PT Short Term Goal 3 (Week 1): Pt will ambulate 20' with min assist and 2nd person WC follow  Recommendations for other services: None   Skilled Therapeutic Intervention SESSION 1: Evaluation completed (see details above and below) with education on PT POC and goals and individual treatment initiated with focus on therapeutic activities for bed mobility training, transfer training, WC fit and  positioning, and participation with self care tasks. Pt denies pain at start of session stating premedicated. Pt motivated to mobilize OOB due to pt with urgency to use BSC. Therapist dons knee immobilizer with pt in supine. Pt able to complete supine to sit with hospital bed features with mod assist. Pt completes sit>stand to stedy with min assist x2 and transfer to [facility] with assist x2 for managing brief with pt hesitant to sit to  BSC wanting a pillow behind her. Pt continent of bowel and bladder, charted. Pt completes stand with assist x2 from [facility] to stedy and x2 assist for periarea hygiene and brief management. Pt transferred to 20" WC via stedy with pt hesitant to sit secondary to size of chair and becomes upset, unable to redirect for ~10 minutes with frequent repositioning of RLE to assist with comfort. Therapist provides 22" chair with pt completing mod assist x2 stand to stedy and transferred to new WC with pt reporting increased comfort, pt ceases crying. Pt transported dependently around unit to improve tolerance to upright and sitting in WC. Pt returns to room and remains seated in [facility] with all needs within reach, cal light in place and pt mother at bedside at end of session.  SESSION 2: Pt presents in room seated in [facility] agreeable to PT with encouragement. Pt does not report pain. Session focused on therapeutic exercise for improving tolerance to weightbearing, upright tolerance, and therapeutic activities for transfer training and bed mobility as well as activities to facilitate improved participation with therapies.  Pt transported to main gym and positioned in //bars. Pt completes sit<>stand in //bars with CGA. Pt completes x4 sit<>stand, completes x10 RLE SLR with active assist, attempts to complete weightshifting bilaterally however pt demonstrating minimal weightbearing on RLE. Pt then transported to day room dependenlty for time management and energy conservation.  Pt participates with dance  group with therapist providing mod cues throughout for modifying dance moves to allow pt participation and facilitate improved tolerance to activity as well as improve participation with therapies. Pt participated with dance group in sitting position, utilizing BUEs for UE movements with decreased trunk involvement, able to utilize LLE for any BLE movements with cueing from therapist.  Pt returns to room, completes trasnfer to stedy with CGA and positioned in sitting on EOB. Pt requires mod assist for BLEs into bed and becomes upset with transition to supine, requires increased time to redirect and allow for positioning of RLE for comfort. Pt remains supine with all needs within reach, cal light in place and bed alarm activated at end of session.    Mobility Bed Mobility Bed Mobility: Sit to Supine;Supine to Sit Supine to Sit: Moderate Assistance - Patient 50-74% Sit to Supine: Moderate Assistance - Patient 50-74% Transfers Transfers: Transfer via Theme park manager: Youth worker Ambulation: No   Discharge Criteria: Patient will be discharged from PT if patient refuses treatment 3 consecutive times without medical reason, if treatment goals not met, if there is a change in medical status, if patient makes no progress towards goals or if patient is discharged from hospital.  The above assessment, treatment plan, treatment alternatives and goals were discussed and mutually agreed upon: by patient  Edwin Cap PT, DPT 09/02/2023, 1:02 PM

## 2023-09-02 NOTE — Progress Notes (Signed)
 Occupational Therapy Session Note  Patient Details  Name: Linda Mason MRN: [id number] Date of Birth: 1995/09/27  Session 1: {CHL IP REHAB OT TIME CALCULATIONS:304400400} Session 2: {CHL IP REHAB OT TIME CALCULATIONS:304400400}   Short Term Goals: Week 1:  OT Short Term Goal 1 (Week 1): Pt will complete sit > stand in prep for ADL with min A using LRAD OT Short Term Goal 2 (Week 1): Pt will complete 1/3 toileting steps with CGA for balance OT Short Term Goal 3 (Week 1): Pt will complete toilet transfer with mod A using LRAD  Skilled Therapeutic Interventions/Progress Updates:    Session 1: Patient agreeable to participate in OT session. Reports *** pain level.   Patient participated in skilled OT session focusing on ***. Therapist facilitated/assessed/developed/educated/integrated/elicited *** in order to improve/facilitate/promote   Session 2: Patient agreeable to participate in OT session. Reports *** pain level.   Patient participated in skilled OT session focusing on ***. Therapist facilitated/assessed/developed/educated/integrated/elicited *** in order to improve/facilitate/promote    Therapy Documentation Precautions:  Precautions Precautions: Knee Precaution/Restrictions Comments: Prader-willi syndrome Required Braces or Orthoses: Knee Immobilizer - Right Knee Immobilizer - Right: On when out of bed or walking Restrictions Weight Bearing Restrictions Per Provider Order: Yes RLE Weight Bearing Per Provider Order: Weight bearing as tolerated  Therapy/Group: Individual Therapy  [name], OTR/L,CBIS  Supplemental OT - [facility] and [facility] Secure Chat Preferred   09/02/2023, 9:53 PM

## 2023-09-02 NOTE — Progress Notes (Signed)
 Patient ID: Linda Mason [facility], female   DOB: 11/03/1995, 28 y.o.   MRN: [id number]  1033- SW left message for pt mother Darral Dash to introduce self and explain role. SW requested return phone call.   SW called [facility] to inquire about services offered under pt plan. SW informed pt has Barrister's clerk through [employer] [phone]) ref ID# [id number]. SW spoke with Delaney Meigs with [facility] to learn about plan. Reports pt is assigned to this care management plan,however, they have not been able to make contact with this patient family. SW provided contact information for her mother. States once they make contact with the mother and confirm legal guardian, they will assist with care needs if family agrees. SW requested home care aide form to be emailed. SW will provide contact information to pt mother.   SW sent out trial referral to HHAs to inquire if able to accept referral. All HHAs declined as not in network with plan.  Declined HHAs Cory/[facility] Angie/[facility] Carolyn[facility] Cheryl/[facility] Aaron/[facility] Lynette[facility] Calvin/[facility] Erica/[facility] Art[facility]   Cecile Sheerer, MSW, LCSW Office: [phone] Cell: [phone] Fax: [phone]

## 2023-09-02 NOTE — Progress Notes (Signed)
 PROGRESS NOTE   Subjective/Complaints:   No events overnight.  Nursing unable to perform skin check due to patient crying out with rolling and minimal stimulation. Mother at bedside, assisted by [place] translator, states that this is patient's baseline and she is very difficult to motivate; will refuse, scream, and cry.  At home, requires consistent support through this and can eventually perform tasks needed.  Is motivated by food, coloring books, and puzzles.  Patient has no complaints unless specifically asked, endorses 11 out of 10 pain in her right lower extremity, when discussing therapies patient interjects frequently to say "I cannot do it" and "my leg hurts".  Did sleep poorly overnight due to pain  AM labs pending today; from yesterday, elevated BUN from 20s to 60s, creatinine minimally elevated, hyperkalemia 5.9, hyponatremia 132.  Blood sugars have been elevated into the 200- 300s; appears Semglee was adjusted yesterday to 32 units twice daily.  Per mom, her baseline blood sugars run between 150 and 300.  Bowel bladder, last bowel movement this morning.  ROS: Denies fevers, chills, N/V, abdominal pain, constipation, diarrhea, SOB, cough, chest pain, new weakness or paraesthesias.   + LLE pain + poor sleep  Objective:   No results found. Recent Labs    09/01/23 0913  WBC 6.7  HGB 12.7  HCT 39.4  PLT 63*   Recent Labs    09/01/23 0913  NA 132*  K 5.9*  CL 98  CO2 26  GLUCOSE 268*  BUN 61*  CREATININE 0.56  CALCIUM 9.0    Intake/Output Summary (Last 24 hours) at 09/02/2023 6295 Last data filed at 09/01/2023 1841 Gross per 24 hour  Intake --  Output 700 ml  Net -700 ml        Physical Exam: Vital Signs Blood pressure 135/77, pulse 90, temperature 98.8 F (37.1 C), resp. rate 18, SpO2 92%. Constitutional: No apparent distress. Appropriate appearance for age.  Obese. HENT: No JVD. Neck Supple.  Trachea midline. Atraumatic, normocephalic. Eyes: PERRLA. EOMI. Visual fields grossly intact.  Cardiovascular: RRR, no murmurs/rub/gallops. No Edema. Peripheral pulses 2+  Respiratory: CTAB. No rales, rhonchi, or wheezing.  On 1 L nasal cannula. Abdomen: + bowel sounds, normoactive. No distention or tenderness.  Skin: C/D/I. No apparent lesions.  Unable to examine surgical site due to patient discomfort.   MSK:     Apparent deformities.  Right lower extremity extension brace.=  Neurologic exam:  Cognition: AAO to person, place, time and event.  Follows all simple commands. + Baseline higher-level cognitive deficits in insight and attention  Language: Fluent, No substitutions or neoglisms. No dysarthria. Names 3/3 objects correctly.  Memory: No apparent deficits  Insight: Poor insight into current condition.  Mood: Pleasant affect, appropriate mood.  Sensation: Equal and intact in BL UE and Les.  Reflexes: 2+ in BL UE and LEs. Negative Hoffman's and babinski signs bilaterally.  CN: 2-12 grossly intact.  Coordination: No apparent tremors. No ataxia on FTN, Spasticity: MAS 0 in all extremities.       Strength:                RUE: 5/5 SA, 5/5 EF, 5/5 EE, 5/5 WE, 5/5  FF, 5/5 FA                 LUE: 5/5 SA, 5/5 EF, 5/5 EE, 5/5 WE, 5/5 FF, 5/5 FA                 RLE: Limited due to extension brace; 1/5 HF, 2/5 DF, 2/5 PF due to pain                LLE:  3/5 HF, 4/5 KE, 5/5 DF, 5/5 EHL, 5/5 PF      Assessment/Plan: 1. Functional deficits which require 3+ hours per day of interdisciplinary therapy in a comprehensive inpatient rehab setting. Physiatrist is providing close team supervision and 24 hour management of active medical problems listed below. Physiatrist and rehab team continue to assess barriers to discharge/monitor patient progress toward functional and medical goals  Care Tool:  Bathing              Bathing assist       Upper Body Dressing/Undressing Upper body  dressing        Upper body assist      Lower Body Dressing/Undressing Lower body dressing            Lower body assist       Toileting Toileting    Toileting assist Assist for toileting: Dependent - Patient 0%     Transfers Chair/bed transfer  Transfers assist     Chair/bed transfer assist level: 2 Helpers     Locomotion Ambulation   Ambulation assist              Walk 10 feet activity   Assist           Walk 50 feet activity   Assist           Walk 150 feet activity   Assist           Walk 10 feet on uneven surface  activity   Assist           Wheelchair     Assist               Wheelchair 50 feet with 2 turns activity    Assist            Wheelchair 150 feet activity     Assist          Blood pressure 135/77, pulse 90, temperature 98.8 F (37.1 C), resp. rate 18, SpO2 92%.  Medical Problem List and Plan: 1. Functional deficits secondary to right knee patellar dislocation.  Status post right knee arthroscopic assisted patellar reduction with lateral release.  Right knee arthroscopic loose body removal right knee open management of lateral femoral condyle and medial patella fracture 08/27/2023.  Weightbearing as tolerated.  Knee immobilizer when out of bed or when ambulating.             -patient may shower             -ELOS/Goals: 10-14 days MinA/S              -stable to continue CIR   2.  Antithrombotics: -DVT/anticoagulation:  Mechanical: Antiembolism stockings, thigh (TED hose) Bilateral lower extremities.  Venous Dopplers negative             -antiplatelet therapy: continue Aspirin 325 mg daily x 2 weeks   3. Pain Management: continue Neurontin 100 mg 3 times daily Robaxin 500 mg 3 times daily, Naprosyn 500 mg twice daily, oxycodone  as needed  -2-27: Poor pain control interrupting sleep and very limiting for therapy progress.  Increase regimen to scheduled Tylenol 1000 mg 3 times  daily, Robaxin 750 mg 3 times daily, and scheduled oxycodone 5 mg 3 times daily in addition to 5 mg every 4 hours as needed.   4. Mood/Behavior/Sleep: Provide emotional support             -antipsychotic agents: N/A  -2-27: Poor sleep due to right leg pain.  Pain control as above, add as needed melatonin   5. Neuropsych/cognition: This patient is not capable of making decisions on her own behalf.   6. Skin/Wound Care: Routine skin checks   7. Fluids/Electrolytes/Nutrition: Routine in and outs with follow-up chemistries    - K 5.9, Na 132, BUN 62 - IVF at 100 cc/hr 2/26  - Labs pending this AM; starting IV fluids as below.  Repeat labs tonight to ensure downtrend if ongoing elevation  8.  Prader-Willi syndrome.  Patient lives with parents independent prior to admission   -Will get behavioral plan in place to assist with staff understanding patient's cognitive baseline and motivation; per mom, does well with food motivators and puzzles/coloring books.  Would use food sparingly given uncontrolled diabetes.  9.  Diabetes mellitus.  Hemoglobin A1c 9.5.  continue NovoLog 6 units 3 times daily, Semglee 55 units nightly  - Per mom, baseline blood sugar between 150 and 300    - Was switched to semglee 32 U BID yesterday; IV fluids today and monitor  Recent Labs    09/01/23 2303 09/02/23 0358 09/02/23 0612  GLUCAP 331* 305* 271*     10.  Hypertension.  Lisinopril 40 mg daily, Toprol-XL 25 mg daily    - normotensive    09/02/2023    6:01 AM 09/01/2023    7:41 PM 09/01/2023    3:49 PM  Vitals with BMI  Systolic 135 116 086  Diastolic 77 66 75  Pulse 90 94 95    11.  Obesity.  BMI 44.88.  Dietary follow-up.  Complicated by significant food motivation due to Prader-Willi syndrome.    LOS: 1 days A FACE TO FACE EVALUATION WAS PERFORMED  Linda Mason 09/02/2023, 9:37 AM

## 2023-09-02 NOTE — Progress Notes (Signed)
 No skin breakdown noted on patient's backside. Able to assess with assist from PT.

## 2023-09-02 NOTE — Evaluation (Signed)
 Occupational Therapy Assessment and Plan  Patient Details  Name: Linda Mason MRN: [id number] Date of Birth: Sep 22, 1995  OT Diagnosis: acute pain, cognitive deficits, and muscle weakness (generalized) Rehab Potential: Rehab Potential (ACUTE ONLY): Good ELOS: 2 weeks   Today's Date: 09/02/2023 OT Individual Time: 1047-1200 OT Individual Time Calculation (min): 73 min     Hospital Problem: Principal Problem:   Closed patellar dislocation, right, initial encounter   Past Medical History:  Past Medical History:  Diagnosis Date   Diabetes mellitus without complication (HCC)    Past Surgical History:  Past Surgical History:  Procedure Laterality Date   KNEE ARTHROSCOPY Right 08/27/2023   Procedure: ARTHROSCOPY KNEE;  Surgeon: Signa Kell, MD;  Location: [facility] ORS;  Service: Orthopedics;  Laterality: Right;    Assessment & Plan Clinical Impression:  Linda Mason is a 28 year old right-handed morbidly obese female and BMI 44.88 as well as diabetes mellitus, Prader-Willi syndrome, intellectual delay per chart review patient lives with parents.  1 level home 5 steps to entry..  The patient does speak [place] but parents do not.  Family available as needed on discharge.  Presented 08/26/2023 after mechanical fall getting out of her bed.  No loss of consciousness.  X-rays and imaging revealed right knee irreducible patellar dislocation right knee lateral femoral condyle impacting fracture as well as right knee medial patella impaction fracture.  Initial attempts at surgery 08/26/2023 placed on hold due to patient being tachycardic oxygen saturation dropping into the 80s on room air and a low-grade fever 101.4.  CT of the chest showed no acute cardiopulmonary disease no findings suspicious for pneumonia.  CT angio of the chest showed no embolism or lung mass.  Bilateral venous Doppler study showed no signs of DVT.  Fever did subside no white count elevation mild elevation of procalcitonin.   Negative for flu/COVID/RSV.  She was cleared for surgery underwent right knee arthroscopic assisted patellar reduction with lateral release.  Right knee arthroscopic loose body removal right knee open management of lateral femoral condyle and medial patella fracture 08/27/2023 per Dr. Signa Kell.  Placed on aspirin 325 mg daily for DVT prophylaxis.  Weightbearing as tolerated right lower extremity knee immobilizer when out of bed or when walking.  Hospital course hyponatremia 132 bouts of hyperkalemia 5.9.  Therapy evaluations completed due to patient decreased functional mobility was admitted for a comprehensive rehab program. She is currently MaxA with therapy. Patient transferred to CIR on 09/01/2023 .    Patient currently requires  max A  with basic self-care skills secondary to muscle weakness, decreased cardiorespiratoy endurance, decreased coordination, decreased initiation, decreased attention, decreased awareness, decreased problem solving, decreased safety awareness, decreased memory, and delayed processing, and decreased standing balance.  Prior to hospitalization, patient could complete self-care independently/with intermittent supervision.  Patient will benefit from skilled intervention to decrease level of assist with basic self-care skills and increase independence with basic self-care skills prior to discharge home with care partner.  Anticipate patient will require minimal physical assistance and follow up home health.  OT - End of Session Activity Tolerance: Tolerates < 10 min activity, no significant change in vital signs Endurance Deficit: Yes Endurance Deficit Description: tachycardic after activity and at rest OT Assessment Rehab Potential (ACUTE ONLY): Good OT Barriers to Discharge: Inaccessible home environment;Wound Care OT Patient demonstrates impairments in the following area(s): Balance;Behavior;Cognition;Edema;Endurance;Motor;Pain;Safety OT Basic ADL's Functional Problem(s):  Bathing;Dressing;Toileting OT Transfers Functional Problem(s): Toilet;Tub/Shower OT Additional Impairment(s): None OT Plan OT Intensity: Minimum of 1-2 x/day,  45 to 90 minutes OT Frequency: 5 out of 7 days OT Duration/Estimated Length of Stay: 2 weeks OT Treatment/Interventions: Balance/vestibular training;Discharge planning;Pain management;Self Care/advanced ADL retraining;Therapeutic Activities;UE/LE Coordination activities;Cognitive remediation/compensation;Disease mangement/prevention;Functional mobility training;Patient/family education;Skin care/wound managment;Therapeutic Exercise;DME/adaptive equipment instruction;Neuromuscular re-education;Wheelchair propulsion/positioning;UE/LE Strength taining/ROM OT Self Feeding Anticipated Outcome(s): Set up A OT Basic Self-Care Anticipated Outcome(s): Min A OT Toileting Anticipated Outcome(s): CGA OT Bathroom Transfers Anticipated Outcome(s): CGA OT Recommendation Recommendations for Other Services: Therapeutic Recreation consult Therapeutic Recreation Interventions: Pet therapy Patient destination: Home Follow Up Recommendations: Home health OT;24 hour supervision/assistance Equipment Recommended: To be determined   OT Evaluation Precautions/Restrictions  Precautions Precautions: Knee Precaution/Restrictions Comments: Prader-willi syndrome Required Braces or Orthoses: Knee Immobilizer - Right Knee Immobilizer - Right: On when out of bed or walking Restrictions Weight Bearing Restrictions Per Provider Order: Yes RLE Weight Bearing Per Provider Order: Weight bearing as tolerated Home Living/Prior Functioning Home Living Living Arrangements: Parent Available Help at Discharge: Family, Available 24 hours/day Type of Home: House Home Access: Stairs to enter [employer] of Steps: 5 Home Layout: One level Bathroom Shower/Tub: Door (sliding door) Insurance claims handler Accessibility: Yes Additional Comments: Info  received from pt's mother; reports that they are planning for ramp to be installed (do not have current company or plan), no prior DME.  Lives With: Family IADL History Homemaking Responsibilities: No Leisure and Hobbies: IT trainer, eating snacks, math cards Prior Function Level of Independence: Independent with homemaking with ambulation, Independent with basic ADLs, Independent with gait  Able to Take Stairs?: Yes Driving: No Vision Baseline Vision/History: 0 No visual deficits Ability to See in Adequate Light: 0 Adequate Patient Visual Report: No change from baseline Vision Assessment?: No apparent visual deficits Perception  Perception: Within Functional Limits Praxis Praxis: Impaired Praxis Impairment Details: Initiation;Perseveration;Motor planning Cognition Cognition Overall Cognitive Status: History of cognitive impairments - at baseline Arousal/Alertness: Awake/alert Orientation Level: Person;Place;Situation Person: Oriented Place: Oriented Situation: Disoriented Memory: Impaired Awareness: Impaired Problem Solving: Impaired Safety/Judgment: Impaired Brief Interview for Mental Status (BIMS) Repetition of Three Words (First Attempt): 2 Temporal Orientation: Year: Missed by more than 5 years Temporal Orientation: Month: No answer Temporal Orientation: Day: No answer Recall: "Sock": No, could not recall Recall: "Blue": No, could not recall Recall: "Bed": No, could not recall BIMS Summary Score: 2 Sensation Sensation Light Touch: Impaired by gross assessment Hot/Cold: Not tested Proprioception: Appears Intact Stereognosis: Not tested Additional Comments: RLE impaired sensation with report of numbness/tingling Coordination Gross Motor Movements are Fluid and Coordinated: No Fine Motor Movements are Fluid and Coordinated: No Finger Nose Finger Test: difficulty following commands but [facility] Motor  Motor Motor: Other (comment);Abnormal postural alignment and  control Motor - Skilled Clinical Observations: mobility limited due to R knee immobilizer and RLE pain  Trunk/Postural Assessment  Cervical Assessment Cervical Assessment: Exceptions to [facility] (decreased cervical rotation, forward head) Thoracic Assessment Thoracic Assessment: Exceptions to [facility] (rounded shoulders) Lumbar Assessment Lumbar Assessment: Exceptions to [facility] (posterior pelvic tilt) Postural Control Postural Control: Deficits on evaluation Righting Reactions: delayed and inadequate Protective Responses: decreased Postural Limitations: decreased  Balance Balance Balance Assessed: Yes Static Sitting Balance Static Sitting - Balance Support: Feet supported Static Sitting - Level of Assistance: 4: Min assist Dynamic Sitting Balance Dynamic Sitting - Balance Support: Feet supported;Bilateral upper extremity supported;During functional activity Dynamic Sitting - Level of Assistance: 4: Min Oncologist Standing - Balance Support: Bilateral upper extremity supported Static Standing - Level of Assistance: 4: Min assist Extremity/Trunk Assessment RUE Assessment RUE  Assessment: Exceptions to [facility] Active Range of Motion (AROM) Comments: Limited shoulder flexion to 90 degrees, distally [facility] General Strength Comments: 3+/5 grossly LUE Assessment LUE Assessment: Exceptions to [facility] Active Range of Motion (AROM) Comments: Limited shoulder flexion to 90 degrees, distally WFL General Strength Comments: 3+/5 grossly  Care Tool Care Tool Self Care Eating   Eating Assist Level: Set up assist    Oral Care    Oral Care Assist Level: Supervision/Verbal cueing    Bathing   Body parts bathed by patient: Right arm;Left arm;Chest;Abdomen;Face Body parts bathed by helper: Front perineal area;Buttocks;Right upper leg;Left upper leg;Right lower leg;Left lower leg   Assist Level: Maximal Assistance - Patient 24 - 49%    Upper Body Dressing(including orthotics)   What is  the patient wearing?: Pull over shirt   Assist Level: Minimal Assistance - Patient > 75%    Lower Body Dressing (excluding footwear)   What is the patient wearing?: Incontinence brief Assist for lower body dressing: 2 Helpers    Putting on/Taking off footwear   What is the patient wearing?: Non-skid slipper socks Assist for footwear: Dependent - Patient 0%       Care Tool Toileting Toileting activity   Assist for toileting: 2 Helpers     Care Tool Bed Mobility Roll left and right activity        Sit to lying activity        Lying to sitting on side of bed activity         Care Tool Transfers Sit to stand transfer        Chair/bed transfer         Toilet transfer   Assist Level: Dependent - Patient 0% (stedy)     Care Tool Cognition  Expression of Ideas and Wants Expression of Ideas and Wants: 3. Some difficulty - exhibits some difficulty with expressing needs and ideas (e.g, some words or finishing thoughts) or speech is not clear  Understanding Verbal and Non-Verbal Content Understanding Verbal and Non-Verbal Content: 3. Usually understands - understands most conversations, but misses some part/intent of message. Requires cues at times to understand   Memory/Recall Ability Memory/Recall Ability : That he or she is in a hospital/hospital unit   Refer to Care Plan for Long Term Goals  SHORT TERM GOAL WEEK 1 OT Short Term Goal 1 (Week 1): Pt will complete sit > stand in prep for ADL with min A using LRAD OT Short Term Goal 2 (Week 1): Pt will complete 1/3 toileting steps with CGA for balance OT Short Term Goal 3 (Week 1): Pt will complete toilet transfer with mod A using LRAD  Recommendations for other services: Therapeutic Recreation  Pet therapy   Skilled Therapeutic Intervention Patient received seated in w/c with mother present upon therapy arrival and agreeable to participate in OT evaluation. Education provided on OT purpose, therapy schedule, goals for  therapy, and safety policy while in rehab. Labs and MD arrived, therefore OT retrieved stratus interpreter for care team to communicate with mother due to no in person interpreter available. No number provided, however high level of pain visible with positional changes in R knee; pre-medicated, with continuous ice applied throughout session. OT offered rest breaks, repositioning and distraction for pain reduction.  Mother expressed that pt has behavior challenges, I.e. screaming, throwing tantrums and crying in various situations and will often exhibit these signs when trying to "get out of it." Discussed with MD the plan for behavior plan meeting to  promote pt success while in IPR.   Patient demonstrates global deconditioning, dynamic standing balance, baseline cognitive (awareness, safety, sequencing, problem solving), and functional endurance deficits with high levels of pain resulting in difficulty completing BADL tasks without increased physical assist. Cues needed throughout for sequencing, safety and pain management as pt did become tearful and very vocal during sit > stands with stedy but with mom's assist and coaxing pt tolerated toileting steps. Pt will benefit from skilled OT services to focus on mentioned deficits. See below for ADL and functional transfer performance. ADLs completed in w/c and on BSC, continent of urinary void, but required +2 in stedy for toileting steps. Pt remained seated in w/c at conclusion of session with chair alarm on and all needs met at end of session.  HR noted to be 110 bpm immediately following activity, only lowering to 104 after prolonged rest. On 1 L O2 via [place], with SPO2 at 97%. Lowered pt to 0.5 L due to current order of O2 only for <88%, and pt stating "I can't breathe without it" and not receptive to doffing.  ADL ADL Eating: Set up Where Assessed-Eating: Wheelchair Grooming: Supervision/safety Where Assessed-Grooming: Wheelchair Upper Body Bathing:  Supervision/safety Where Assessed-Upper Body Bathing: Wheelchair Lower Body Bathing: Maximal assistance Where Assessed-Lower Body Bathing: Other (Comment) (BSC) Upper Body Dressing: Minimal assistance Where Assessed-Upper Body Dressing: Wheelchair Lower Body Dressing: Maximal assistance Where Assessed-Lower Body Dressing: Other (Comment) (BSC) Toileting: Dependent Where Assessed-Toileting: Bedside Commode Toilet Transfer: Dependent Toilet Transfer Method: Other (comment) (stedy) Toilet Transfer Equipment: Bedside commode Tub/Shower Transfer: Unable to assess Tub/Shower Transfer Method: Unable to assess Film/video editor: Unable to assess Film/video editor Method: Unable to assess Mobility  Bed Mobility Bed Mobility: Sit to Supine;Supine to Sit Supine to Sit: Moderate Assistance - Patient 50-74% Sit to Supine: Moderate Assistance - Patient 50-74%   Discharge Criteria: Patient will be discharged from OT if patient refuses treatment 3 consecutive times without medical reason, if treatment goals not met, if there is a change in medical status, if patient makes no progress towards goals or if patient is discharged from hospital.  The above assessment, treatment plan, treatment alternatives and goals were discussed and mutually agreed upon: by patient and by family  Melvyn Novas, MS, OTR/L  09/02/2023, 12:29 PM

## 2023-09-02 NOTE — Plan of Care (Signed)
  Problem: RH Balance Goal: LTG Patient will maintain dynamic standing balance (PT) Description: LTG:  Patient will maintain dynamic standing balance with assistance during mobility activities (PT) Flowsheets (Taken 09/02/2023 1623) LTG: Pt will maintain dynamic standing balance during mobility activities with:: Supervision/Verbal cueing   Problem: Sit to Stand Goal: LTG:  Patient will perform sit to stand with assistance level (PT) Description: LTG:  Patient will perform sit to stand with assistance level (PT) Flowsheets (Taken 09/02/2023 1623) LTG: PT will perform sit to stand in preparation for functional mobility with assistance level: Supervision/Verbal cueing   Problem: RH Bed Mobility Goal: LTG Patient will perform bed mobility with assist (PT) Description: LTG: Patient will perform bed mobility with assistance, with/without cues (PT). Flowsheets (Taken 09/02/2023 1623) LTG: Pt will perform bed mobility with assistance level of: Minimal Assistance - Patient > 75%   Problem: RH Bed to Chair Transfers Goal: LTG Patient will perform bed/chair transfers w/assist (PT) Description: LTG: Patient will perform bed to chair transfers with assistance (PT). Flowsheets (Taken 09/02/2023 1623) LTG: Pt will perform Bed to Chair Transfers with assistance level: Supervision/Verbal cueing   Problem: RH Car Transfers Goal: LTG Patient will perform car transfers with assist (PT) Description: LTG: Patient will perform car transfers with assistance (PT). Flowsheets (Taken 09/02/2023 1623) LTG: Pt will perform car transfers with assist:: Minimal Assistance - Patient > 75%   Problem: RH Ambulation Goal: LTG Patient will ambulate in home environment (PT) Description: LTG: Patient will ambulate in home environment, # of feet with assistance (PT). Flowsheets (Taken 09/02/2023 1623) LTG: Pt will ambulate in home environ  assist needed:: Supervision/Verbal cueing LTG: Ambulation distance in home environment:  50'   Problem: RH Wheelchair Mobility Goal: LTG Patient will propel w/c in home environment (PT) Description: LTG: Patient will propel wheelchair in home environment, # of feet with assistance (PT). Flowsheets (Taken 09/02/2023 1623) LTG: Pt will propel w/c in home environ  assist needed:: Minimal Assistance - Patient > 75% Distance: wheelchair distance in controlled environment: 50   Problem: RH Stairs Goal: LTG Patient will ambulate up and down stairs w/assist (PT) Description: LTG: Patient will ambulate up and down # of stairs with assistance (PT) Flowsheets (Taken 09/02/2023 1623) LTG: Pt will ambulate up/down stairs assist needed:: Minimal Assistance - Patient > 75% LTG: Pt will  ambulate up and down number of stairs: 5

## 2023-09-02 NOTE — Progress Notes (Signed)
 Inpatient Rehabilitation Care Coordinator Assessment and Plan Patient Details  Name: Linda Mason MRN: [id number] Date of Birth: 1996/04/11  Today's Date: 09/02/2023  Hospital Problems: Principal Problem:   Closed patellar dislocation, right, initial encounter  Past Medical History:  Past Medical History:  Diagnosis Date   Diabetes mellitus without complication [facility])    Past Surgical History:  Past Surgical History:  Procedure Laterality Date   KNEE ARTHROSCOPY Right 08/27/2023   Procedure: ARTHROSCOPY KNEE;  Surgeon: Signa Kell, MD;  Location: [facility] ORS;  Service: Orthopedics;  Laterality: Right;   Social History:  reports that she has never smoked. She does not have any smokeless tobacco history on file. She reports that she does not drink alcohol. No history on file for drug use.  Family / Support Systems Marital Status: Single Spouse/Significant Other: N/A Children: N/A Other Supports: Pt parents and siblings Anticipated Caregiver: Pt mother Linda Mason Ability/Limitations of Caregiver: Pt mother is able to provide 24/7 care Caregiver Availability: 24/7 Family Dynamics: Pt lives with her parents and siblings.  Social History Preferred language: Spanish Religion: Patient Refused Cultural Background: Pt has IDD; she was able to graduate higschool Education: high school grad Health Literacy - How often do you need to have someone help you when you read instructions, pamphlets, or other written material from your doctor or pharmacy?: Always Writes: Yes Employment Status: Disabled Marine scientist Issues: Denies Guardian/Conservator: Pt mother is leghal guardian. Pt mother does not have guardianship forms. SW explained in length steps to obtain through [facility] of [address].   Abuse/Neglect Abuse/Neglect Assessment Can Be Completed: Unable to assess, patient is non-responsive or altered mental status (due to IDD; unsure on accuracy)  Patient  response to: Social Isolation - How often do you feel lonely or isolated from those around you?: Never  Emotional Status Pt's affect, behavior and adjustment status: Pt presents with labile mood; obvious IDD present. Pt is able to answer some of SW questions. 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 She would like for her to be able to stand up. SW confirmed pt is NWB RLE. SW discussed the likelihood that she may not standing completley at discharge. She understands.  Community Resources [employer]: None Premorbid Home Care/DME Agencies: None Transportation available at discharge: TBD Is the patient able to respond to transportation needs?: Yes In the past 12 months, has lack of transportation kept you from medical appointments or from getting medications?: No In the past 12 months, has lack of transportation kept you from meetings, work, or from getting things needed for daily living?: No Resource referrals recommended: Neuropsychology  Discharge Planning Living Arrangements: Parent, Other relatives Support Systems: Parent, Other relatives Type of Residence: Private residence Insurance Resources: Media planner (specify) Equities trader Health Tailored  Plan) Financial Resources: SSD Financial Screen Referred: No Living Expenses: Lives with  family Money Management: Family Does the patient have any problems obtaining your medications?: No Home Management: family manages home care needs Patient/Family Preliminary Plans: No  changes Care Coordinator Barriers to Discharge: Decreased caregiver support, Lack of/limited family support, Weight bearing restrictions, Insurance for SNF coverage Care Coordinator Anticipated Follow Up Needs: HH/OP  Clinical Impression SW met with pt and pt mother using [employer] Spanish Interpreter Linda Mason ID# [phone]. Pt mother answered questions for SW. Pt is not a Cytogeneticist. No HCPOA. No DME.   Gretchen Short 09/02/2023, 3:51 PM

## 2023-09-03 DIAGNOSIS — S83004A Unspecified dislocation of right patella, initial encounter: Secondary | ICD-10-CM | POA: Diagnosis not present

## 2023-09-03 LAB — POTASSIUM: Potassium: 5.1 mmol/L (ref 3.5–5.1)

## 2023-09-03 LAB — BASIC METABOLIC PANEL
Anion gap: 2 — ABNORMAL LOW (ref 5–15)
BUN: 23 mg/dL — ABNORMAL HIGH (ref 6–20)
CO2: 33 mmol/L — ABNORMAL HIGH (ref 22–32)
Calcium: 8.6 mg/dL — ABNORMAL LOW (ref 8.9–10.3)
Chloride: 97 mmol/L — ABNORMAL LOW (ref 98–111)
Creatinine, Ser: 0.41 mg/dL — ABNORMAL LOW (ref 0.44–1.00)
GFR, Estimated: >60 mL/min (ref >60)
Glucose, Bld: 256 mg/dL — ABNORMAL HIGH (ref 70–99)
Potassium: 6 mmol/L — ABNORMAL HIGH (ref 3.5–5.1)
Sodium: 132 mmol/L — ABNORMAL LOW (ref 135–145)

## 2023-09-03 LAB — GLUCOSE, CAPILLARY
Glucose-Capillary: 258 mg/dL — ABNORMAL HIGH (ref 70–99)
Glucose-Capillary: 278 mg/dL — ABNORMAL HIGH (ref 70–99)
Glucose-Capillary: 342 mg/dL — ABNORMAL HIGH (ref 70–99)
Glucose-Capillary: 305 mg/dL — ABNORMAL HIGH (ref 70–99)

## 2023-09-03 MED ORDER — METFORMIN HCL 500 MG PO TABS
1000.0000 mg | ORAL_TABLET | Freq: Two times a day (BID) | ORAL | Status: DC
  Administered 2023-09-03 – 2023-09-16 (×26): 1000 mg via ORAL
  Filled 2023-09-03 (×26): qty 2

## 2023-09-03 MED ORDER — INSULIN ASPART 100 UNIT/ML IJ SOLN
10.0000 [IU] | Freq: Three times a day (TID) | INTRAMUSCULAR | Status: DC
  Administered 2023-09-03 – 2023-09-04 (×3): 10 [IU] via SUBCUTANEOUS

## 2023-09-03 MED ORDER — GABAPENTIN 300 MG PO CAPS
300.0000 mg | ORAL_CAPSULE | Freq: Every day | ORAL | Status: DC
  Administered 2023-09-03 – 2023-09-15 (×13): 300 mg via ORAL
  Filled 2023-09-03 (×13): qty 1

## 2023-09-03 MED ORDER — INSULIN GLARGINE 100 UNIT/ML ~~LOC~~ SOLN
45.0000 [IU] | Freq: Two times a day (BID) | SUBCUTANEOUS | Status: DC
  Administered 2023-09-03: 45 [IU] via SUBCUTANEOUS
  Filled 2023-09-03 (×3): qty 0.45

## 2023-09-03 MED ORDER — INSULIN GLARGINE 100 UNITS/ML SOLOSTAR PEN
13.0000 [IU] | PEN_INJECTOR | Freq: Once | SUBCUTANEOUS | Status: DC
Start: 2023-09-03 — End: 2023-09-03

## 2023-09-03 MED ORDER — SODIUM CHLORIDE 0.9 % IV SOLN: INTRAVENOUS | Status: DC

## 2023-09-03 MED ORDER — FUROSEMIDE 10 MG/ML IJ SOLN
40.0000 mg | Freq: Once | INTRAMUSCULAR | Status: AC
  Administered 2023-09-03: 40 mg via INTRAVENOUS
  Filled 2023-09-03: qty 4

## 2023-09-03 MED ORDER — GABAPENTIN 100 MG PO CAPS
100.0000 mg | ORAL_CAPSULE | Freq: Two times a day (BID) | ORAL | Status: DC
  Administered 2023-09-03 – 2023-09-16 (×26): 100 mg via ORAL
  Filled 2023-09-03 (×26): qty 1

## 2023-09-03 MED ORDER — INSULIN GLARGINE 100 UNIT/ML ~~LOC~~ SOLN
13.0000 [IU] | Freq: Once | SUBCUTANEOUS | Status: AC
  Administered 2023-09-03: 13 [IU] via SUBCUTANEOUS
  Filled 2023-09-03: qty 0.13

## 2023-09-03 NOTE — Progress Notes (Signed)
 Occupational Therapy Session Note  Patient Details  Name: Linda Mason [facility] MRN: [id number] Date of Birth: June 07, 1996  Today's Date: 09/04/2023 OT Individual Time: 4098-1191 OT Individual Time Calculation (min): 45 min    Short Term Goals: Week 1:  OT Short Term Goal 1 (Week 1): Pt will complete sit > stand in prep for ADL with min A using LRAD OT Short Term Goal 2 (Week 1): Pt will complete 1/3 toileting steps with CGA for balance OT Short Term Goal 3 (Week 1): Pt will complete toilet transfer with mod A using LRAD  Skilled Therapeutic Interventions/Progress Updates:    Patient agreeable to participate in OT session. Demonstrates no right knee pain during session.   Patient participated in skilled OT session focusing on grooming while seated.  Pt positioned seated in WC at sink and completed all steps of oral hygiene with set-up as needed. Pt was able to retrieve necessary items from bag and request any assistance if needed.  OT provided total A for removal of matted hair using detangler spray and hair brush.   Therapy Documentation Precautions:  Precautions Precautions: Knee Precaution/Restrictions Comments: Prader-willi syndrome Required Braces or Orthoses: Knee Immobilizer - Right Knee Immobilizer - Right: On when out of bed or walking Restrictions Weight Bearing Restrictions Per Provider Order: Yes RLE Weight Bearing Per Provider Order: Weight bearing as tolerated  Therapy/Group: Individual Therapy  Linda Mason, OTR/L,CBIS  Supplemental OT - [facility] and [facility] Secure Chat Preferred   09/04/2023, 9:50 AM

## 2023-09-03 NOTE — Progress Notes (Addendum)
 PROGRESS NOTE   Subjective/Complaints:   No events overnight.   Blood sugars remain elevated 200-400s, lower in the a.m. consistently, BMP this morning slightly improved but with potassium uptrending to 6.0.  No anion gap.  After reviewing outpatient endocrinology notes, discussed with mom in a.m., confirmed that they have been able to obtain a Dexcom since the last office visit but are unable to use it at the Wi-Fi in the hospital.  Also, she was informed to continue the last pen of Ozempic until the medication ran out.  Mother has all of her home medications at bedside.  Patient complaining of 7-8 out of 10 pain overnight into her right leg, still having difficulty sleeping, although better controlled with therapies.  Was able to get her weightbearing this morning and walking a little this afternoon with encouragement.  Stable vitals  ROS: Denies fevers, chills, N/V, abdominal pain, constipation, diarrhea, SOB, cough, chest pain, new weakness or paraesthesias.   + LLE pain--ongoing, slightly improved + poor sleep--ongoing  Objective:   No results found. Recent Labs    09/01/23 0913 09/02/23 1058  WBC 6.7 5.5  HGB 12.7 11.5*  HCT 39.4 35.2*  PLT 63* 56*   Recent Labs    09/02/23 1645 09/03/23 0654  NA 131* 132*  K 5.4* 6.0*  CL 96* 97*  CO2 26 33*  GLUCOSE 383* 256*  BUN 27* 23*  CREATININE 0.57 0.41*  CALCIUM 8.6* 8.6*    Intake/Output Summary (Last 24 hours) at 09/03/2023 0937 Last data filed at 09/03/2023 1610 Gross per 24 hour  Intake 1665.93 ml  Output --  Net 1665.93 ml        Physical Exam: Vital Signs Blood pressure 121/77, pulse 73, temperature 97.9 F (36.6 C), resp. rate 16, SpO2 94%. Constitutional: No apparent distress. Appropriate appearance for age.  Morbidly obese. HENT: No JVD. Neck Supple. Trachea midline. Atraumatic, normocephalic. Eyes: PERRLA. EOMI. Visual fields grossly intact.   Cardiovascular: RRR, no murmurs/rub/gallops. No Edema. Peripheral pulses 2+  Respiratory: CTAB. No rales, rhonchi, or wheezing.  On room air. Abdomen: + bowel sounds, normoactive. No distention or tenderness.  Skin: C/D/I. + New appearance of rash along right face, bilateral elbows; urticarial  MSK:     Apparent deformities.  Right lower extremity extension brace.=  Neurologic exam:  Cognition: AAO to person, place, time and event.  Follows all simple commands. + Baseline higher-level cognitive deficits in insight and attention  Language: Fluent, No substitutions or neoglisms. No dysarthria. Names 3/3 objects correctly.  Memory: No apparent deficits  Insight: Poor insight into current condition.  Mood: Pleasant affect, appropriate mood.  Sensation, reflexes, cranial nerves, coordination, and tone without apparent deficits.        Strength:                RUE: 5/5 SA, 5/5 EF, 5/5 EE, 5/5 WE, 5/5 FF, 5/5 FA                 LUE: 5/5 SA, 5/5 EF, 5/5 EE, 5/5 WE, 5/5 FF, 5/5 FA                 RLE: Limited due  to extension brace; 1/5 HF, 2/5 DF, 2/5 PF due to pain                LLE:  2/5 HF, 4/5 KE, 5/5 DF, 5/5 EHL, 5/5 PF --limited by pain     Assessment/Plan: 1. Functional deficits which require 3+ hours per day of interdisciplinary therapy in a comprehensive inpatient rehab setting. Physiatrist is providing close team supervision and 24 hour management of active medical problems listed below. Physiatrist and rehab team continue to assess barriers to discharge/monitor patient progress toward functional and medical goals  Care Tool:  Bathing    Body parts bathed by patient: Right arm, Left arm, Chest, Abdomen, Face   Body parts bathed by helper: Front perineal area, Buttocks, Right upper leg, Left upper leg, Right lower leg, Left lower leg     Bathing assist Assist Level: Maximal Assistance - Patient 24 - 49%     Upper Body Dressing/Undressing Upper body dressing   What is  the patient wearing?: Pull over shirt    Upper body assist Assist Level: Minimal Assistance - Patient > 75%    Lower Body Dressing/Undressing Lower body dressing      What is the patient wearing?: Incontinence brief     Lower body assist Assist for lower body dressing: 2 Helpers     Toileting Toileting    Toileting assist Assist for toileting: 2 Helpers     Transfers Chair/bed transfer  Transfers assist     Chair/bed transfer assist level: 2 Helpers     Locomotion Ambulation   Ambulation assist   Ambulation activity did not occur: Safety/medical concerns          Walk 10 feet activity   Assist  Walk 10 feet activity did not occur: Safety/medical concerns        Walk 50 feet activity   Assist Walk 50 feet with 2 turns activity did not occur: Safety/medical concerns         Walk 150 feet activity   Assist Walk 150 feet activity did not occur: Safety/medical concerns         Walk 10 feet on uneven surface  activity   Assist Walk 10 feet on uneven surfaces activity did not occur: Safety/medical concerns         Wheelchair     Assist Is the patient using a wheelchair?: Yes Type of Wheelchair: Manual    Wheelchair assist level: Dependent - Patient 0%      Wheelchair 50 feet with 2 turns activity    Assist        Assist Level: Dependent - Patient 0%   Wheelchair 150 feet activity     Assist      Assist Level: Dependent - Patient 0%   Blood pressure 121/77, pulse 73, temperature 97.9 F (36.6 C), resp. rate 16, SpO2 94%.  Medical Problem List and Plan: 1. Functional deficits secondary to right knee patellar dislocation.  Status post right knee arthroscopic assisted patellar reduction with lateral release.  Right knee arthroscopic loose body removal right knee open management of lateral femoral condyle and medial patella fracture 08/27/2023.  Weightbearing as tolerated.  Knee immobilizer when out of bed or  when ambulating.             -patient may shower             -ELOS/Goals: 10-14 days MinA/S              -stable to continue  CIR   2.  Antithrombotics: -DVT/anticoagulation:  Mechanical: Antiembolism stockings, thigh (TED hose) Bilateral lower extremities.  Venous Dopplers negative             -antiplatelet therapy: continue Aspirin 325 mg daily x 2 weeks   3. Pain Management: continue Neurontin 100 mg 3 times daily Robaxin 500 mg 3 times daily, Naprosyn 500 mg twice daily, oxycodone as needed  -2-27: Poor pain control interrupting sleep and very limiting for therapy progress.  Increase regimen to scheduled Tylenol 1000 mg 3 times daily, Robaxin 750 mg 3 times daily, and scheduled oxycodone 5 mg 3 times daily in addition to 5 mg every 4 hours as needed.  2-28: Improvement in pain control with the above measures, does well with premedicating therapies with PRNs.  Increase nightly gabapentin to 300 mg, can consider switching scheduled oxycodone to long-acting OxyContin 10 mg twice daily over the weekend if no improvement (discussed this with mother/therapies, patient will endorse severe pain regardless)   4. Mood/Behavior/Sleep: Provide emotional support             -antipsychotic agents: N/A  -2-27: Poor sleep due to right leg pain.  Pain control as above, add as needed melatonin   5. Neuropsych/cognition: This patient is not capable of making decisions on her own behalf.   6. Skin/Wound Care/RASH new 2/28: Routine skin checks  -2-28: Nursing checking with mom regarding any prior medication reactions.  No recently started medications (noticed before AM lasix).  Has as needed Benadryl.   7. Fluids/Electrolytes/Nutrition: Routine in and outs with follow-up chemistries    - K 5.9, Na 132, BUN 62 - IVF at 100 cc/hr 2/26  - 2-28: BUN/creatinine improved with IV fluids, remains hyperglycemic and with potassium up to 6.0; increase insulin as below, continue IV fluids, give Lasix 40 mg IV today and  retest potassium this afternoon.   -- Responsive to Lasix, repeat 5.1. Stop IVF.  Labs in AM ordered   8.  Prader-Willi syndrome.  Patient lives with parents independent prior to admission   -Will get behavioral plan in place to assist with staff understanding patient's cognitive baseline and motivation; per mom, does well with food motivators and puzzles/coloring books.  Would use food sparingly given uncontrolled diabetes.  9.  Diabetes mellitus.  Hemoglobin A1c 9.5.  continue NovoLog 6 units 3 times daily, Semglee 55 units nightly.  Glycemia likely complicated by poor pain control   - home regimen per chart review endocrinology 06/22/23: Basal insulin: Continue Lantus to 50 units daily Prandial insulin: Continue Novolog 12 units with meals Correctional insulin: Continue Novolog 2: 50 > 150 with meals and the half dose before bed Non-insulin agents: - tirzepatide 5 mg weekly, increase as able (is currently finishing out their last ozempic pen before switching) - Continue metformin ER 1000 mg bid - Continue empagliflozin 25 mg daily    - Per mom, baseline blood sugar between 150 and 300    - Was switched to semglee 32 U BID yesterday; IV fluids today   2/28: Resume home metformin, hold Jardiance due to hyperkalemia, increase Lantus to 45 units twice daily   -Per mom, Dexcom monitor does not work on hospital Wi-Fi, will get diabetes coordinator to assist with recommendations on medication adjustments and see if we can work on this  Recent Labs    09/02/23 1708 09/02/23 2021 09/03/23 0628  GLUCAP 391* 400* 258*     10.  Hypertension.  Lisinopril 40 mg daily, Toprol-XL 25  mg daily    - normotensive    09/03/2023    5:09 AM 09/02/2023    7:22 PM 09/02/2023    3:39 PM  Vitals with BMI  Systolic 121 124 213  Diastolic 77 80 72  Pulse 73 99 101    11.  Morbid obesity.  BMI 44.88.  Dietary follow-up.  Complicated by significant food motivation due to Prader-Willi syndrome.     LOS: 2 days A FACE TO FACE EVALUATION WAS PERFORMED  Linda Mason 09/03/2023, 9:37 AM

## 2023-09-03 NOTE — Inpatient Diabetes Management (Addendum)
 Inpatient Diabetes Program Recommendations  AACE/ADA: New Consensus Statement on Inpatient Glycemic Control (2015)  Target Ranges:  Prepandial:   less than 140 mg/dL      Peak postprandial:   less than 180 mg/dL (1-2 hours)      Critically ill patients:  140 - 180 mg/dL   Lab Results  Component Value Date   GLUCAP 342 (H) 09/03/2023   HGBA1C 9.5 (H) 08/26/2023    Review of Glycemic Control  Diabetes history: DM2 Outpatient Diabetes medications: Semglee 32 units BID, Novolog 0-20 units correction scale TID, Novolgo 4 units TID, Metformin 1000 mg BID, Jardiance 25 mg daily Current orders for Inpatient glycemic control: Lantus 45 units BID, Novolog 0-20 units correction scale TID, Novolog 10 units TID, Metformin 1000 mg BID  Inpatient Diabetes Program Recommendations:   Spoke with mother and patient in the room. Mother was concerned that the Dexcom that patient is wearing was not working with the [facility] here at the hospital. Showed the mother and patient how to turn off the [facility] under settings on their phones. Patient was upset thinking that her phone and Dexcom were going to be taken away. Mother stated that her son would help with the phone. Gave them a sample Dexcom G7 CGM to use if they needed a new one.   Will continue to monitor blood sugars while in the hospital rehab. Agree with current insulin orders.   Smith Mince RN BSN CDE Diabetes Coordinator Pager: [phone]  8am-5pm

## 2023-09-03 NOTE — Plan of Care (Signed)
 Behavioral Plan Guideline Note  Patient Details  Name: Linda Mason MRN: [id number] Date of Birth: [birth year]   Behavioral Plan:    [place] Level  N/A - baseline intellectual disability  Behavior to decrease/eliminate Increase participation; Improve pain   Changes to environment Chair pad alarm instead of safety belt alarm    Interventions Ask if pt needs to use bathroom when rounding    Bed alarm setting   Low sensitivity    # of rails up   3 rails    Recommendations for interactions with patient Remain calm and reduce environment stimulation with agitation DO NOT argue with patient Redirect behaviors to alternate tasks/topics Provide choices provide positive motivators such as music    In Attendance at Behavior Plan Meeting OT; PT; RN       This behavior plan was developed with this patient's interdisciplinary team. It will be updated weekly on the day of conference or as needed with changing behaviors to keep the patient and staff safe. A copy of the Behavior plan will be posted in the patient room, at the RN station with the charge RN and in the patient medical record. If you have any questions or need clarification, please feel free to discuss with the interdisciplinary team mentioned above.  Edwin Cap PT, DPT 09/03/2023, 10:09 AM

## 2023-09-03 NOTE — Progress Notes (Addendum)
 Met with patient, mom with interpreter to review current situation, plan of care and team conferences. Reviewed medications and dietary recommendations. Reviewed pain, sleep, bowel and bladder. Reviewed use of knee immobilizer at all times unless in bed. Reviewed diabetes, hypertension  and nutrition. Behavior plan initiated. Continue to follow along to provide educational needs to facilitate preparation for discharge.

## 2023-09-03 NOTE — Discharge Instructions (Addendum)
 Inpatient Rehab Discharge Instructions  [facility] Discharge date and time: No discharge date for patient encounter.   Activities/Precautions/ Functional Status: Activity: Weightbearing as tolerated knee immobilizer when out of bed or ambulating Diet: Diabetic diet Wound Care: Routine skin checks Functional status:  ___ No restrictions     ___ Walk up steps independently ___ 24/7 supervision/assistance   ___ Walk up steps with assistance ___ Intermittent supervision/assistance  ___ Bathe/dress independently ___ Walk with walker     _x__ Bathe/dress with assistance ___ Walk Independently    ___ Shower independently ___ Walk with assistance    ___ Shower with assistance ___ No alcohol     ___ Return to work/school ________  Special Instructions: No driving smoking or alcohol   COMMUNITY REFERRALS UPON DISCHARGE:    Outpatient: PT      OT             Agency: [facility]  Phone: [phone]             Appointment Date/Time: *Please expect follow-up within 7-10 business days to schedule your appointment. If you have not received follow-up, be sure to contact the site directly.*   Medical Equipment/Items Ordered: wheelchair with elevating leg rests, youth rolling walker, and 3in1 bedside commode                                                 Agency/Supplier: [employer] [phone]  Medical Equipment/Items Ordered: incontinence supplies (pull ups)                                                 Agency/Supplier: [employer] # [phone].    My questions have been answered and I understand these instructions. I will adhere to these goals and the provided educational materials after my discharge from the hospital.  Patient/Caregiver Signature _______________________________ Date __________  Clinician Signature _______________________________________ Date __________  Please bring this form and your medication list with you to all your follow-up  doctor's appointments.

## 2023-09-03 NOTE — Discharge Summary (Signed)
 Physician Discharge Summary  Patient ID: Linda Mason [facility] MRN: [id number] DOB/AGE: 28-29-1997 28 y.o.  Admit date: 09/01/2023 Discharge date: 09/16/2023  Discharge Diagnoses:  Principal Problem:   Closed patellar dislocation, right, initial encounter DVT prophylaxis Prater Willi syndrome Diabetes mellitus Hypertension Obesity  Discharged Condition: Stable  Significant Diagnostic Studies: VAS [place] LOWER EXTREMITY VENOUS (DVT) Result Date: 09/09/2023  Lower Venous DVT Study Patient Name:  Linda Mason  Date of Exam:   09/09/2023 Medical Rec #: [id number]              Accession #:    [id number] Date of Birth: 1996-01-24              Patient Gender: F Patient Age:   12 years Exam Location:  [facility] Procedure:      VAS [place] LOWER EXTREMITY VENOUS (DVT) Referring Phys: Elijah Birk --------------------------------------------------------------------------------  Indications: Swelling.  Limitations: Intellectual delay, patient cried the entire exam, disrupting hemodynamics. Comparison Study: Previous study on 2.21.2025. Performing Technologist: Fernande Bras  Examination Guidelines: A complete evaluation includes B-mode imaging, spectral Doppler, color Doppler, and power Doppler as needed of all accessible portions of each vessel. Bilateral testing is considered an integral part of a complete examination. Limited examinations for reoccurring indications may be performed as noted. The reflux portion of the exam is performed with the patient in reverse Trendelenburg.  +-----+---------------+---------+-----------+----------+--------------+ RIGHTCompressibilityPhasicitySpontaneityPropertiesThrombus Aging +-----+---------------+---------+-----------+----------+--------------+ CFV  Full           Yes      Yes                                 +-----+---------------+---------+-----------+----------+--------------+ SFJ  Full           Yes      Yes                                  +-----+---------------+---------+-----------+----------+--------------+   +---------+---------------+---------+-----------+----------+-------------------+ LEFT     CompressibilityPhasicitySpontaneityPropertiesThrombus Aging      +---------+---------------+---------+-----------+----------+-------------------+ CFV      Full           Yes      Yes                                      +---------+---------------+---------+-----------+----------+-------------------+ SFJ      Full           Yes      Yes                                      +---------+---------------+---------+-----------+----------+-------------------+ FV Prox  Full                                                             +---------+---------------+---------+-----------+----------+-------------------+ FV Mid   Full                                                             +---------+---------------+---------+-----------+----------+-------------------+  FV DistalFull           Yes      Yes                                      +---------+---------------+---------+-----------+----------+-------------------+ PFV      Full                                                             +---------+---------------+---------+-----------+----------+-------------------+ POP      Full           Yes      Yes                                      +---------+---------------+---------+-----------+----------+-------------------+ PTV                                                   Not visualized,                                                           patent by color.    +---------+---------------+---------+-----------+----------+-------------------+ PERO                                                  Not visualized,                                                           patent by color.    +---------+---------------+---------+-----------+----------+-------------------+      Summary: RIGHT: - No evidence of common femoral vein obstruction.   LEFT: - There is no evidence of deep vein thrombosis in the lower extremity. However, portions of this examination were limited- see technologist comments above.  - No cystic structure found in the popliteal fossa.  *See table(s) above for measurements and observations. Electronically signed by Heath Lark on 09/09/2023 at 8:37:55 PM.    Final    US Venous Img Lower Bilateral (DVT) Result Date: 08/27/2023 CLINICAL DATA:  Fever of unknown origin.  Right knee injury. EXAM: BILATERAL LOWER EXTREMITY VENOUS DOPPLER ULTRASOUND TECHNIQUE: Gray-scale sonography with graded compression, as well as color Doppler and duplex ultrasound were performed to evaluate the lower extremity deep venous systems from the level of the common femoral vein and including the common femoral, femoral, profunda femoral, popliteal and calf veins including the posterior tibial, peroneal and gastrocnemius veins when visible. The superficial great saphenous vein was also interrogated. Spectral Doppler was utilized to evaluate flow at rest and with distal  augmentation maneuvers in the common femoral, femoral and popliteal veins. COMPARISON:  None Available. FINDINGS: RIGHT LOWER EXTREMITY Common Femoral Vein: No evidence of thrombus. Normal compressibility, respiratory phasicity and response to augmentation. Saphenofemoral Junction: No evidence of thrombus. Normal compressibility and flow on color Doppler imaging. Profunda Femoral Vein: No evidence of thrombus. Normal compressibility and flow on color Doppler imaging. Femoral Vein: No evidence of thrombus. Normal compressibility, respiratory phasicity and response to augmentation. Popliteal Vein: No evidence of thrombus. Normal compressibility, respiratory phasicity and response to augmentation. Calf Veins: No evidence of thrombus. Normal compressibility and flow on color Doppler imaging. Superficial Great Saphenous Vein: No  evidence of thrombus. Normal compressibility. Venous Reflux:  None. Other Findings:  None. LEFT LOWER EXTREMITY Common Femoral Vein: No evidence of thrombus. Normal compressibility, respiratory phasicity and response to augmentation. Saphenofemoral Junction: No evidence of thrombus. Normal compressibility and flow on color Doppler imaging. Profunda Femoral Vein: No evidence of thrombus. Normal compressibility and flow on color Doppler imaging. Femoral Vein: No evidence of thrombus. Normal compressibility, respiratory phasicity and response to augmentation. Popliteal Vein: No evidence of thrombus. Normal compressibility, respiratory phasicity and response to augmentation. Calf Veins: No evidence of thrombus. Normal compressibility and flow on color Doppler imaging. Superficial Great Saphenous Vein: No evidence of thrombus. Normal compressibility. Venous Reflux:  None. Other Findings:  None. IMPRESSION: No evidence of deep venous thrombosis in either lower extremity. Electronically Signed   By: Lupita Raider M.D.   On: 08/27/2023 16:20   DG C-Arm 1-60 Min-No Report Result Date: 08/27/2023 Fluoroscopy was utilized by the requesting physician.  No radiographic interpretation.   DG C-Arm 1-60 Min-No Report Result Date: 08/27/2023 Fluoroscopy was utilized by the requesting physician.  No radiographic interpretation.   CT Angio Chest Pulmonary Embolism (PE) W or WO Contrast Result Date: 08/27/2023 CLINICAL DATA:  Pulmonary embolism (PE) suspected, high prob. Shortness of breath. Tachycardia. EXAM: CT ANGIOGRAPHY CHEST WITH CONTRAST TECHNIQUE: Multidetector CT imaging of the chest was performed using the standard protocol during bolus administration of intravenous contrast. Multiplanar CT image reconstructions and MIPs were obtained to evaluate the vascular anatomy. RADIATION DOSE REDUCTION: This exam was performed according to the departmental dose-optimization program which includes automated exposure control,  adjustment of the mA and/or kV according to patient size and/or use of iterative reconstruction technique. CONTRAST:  75mL OMNIPAQUE IOHEXOL 350 MG/ML SOLN COMPARISON:  CT scan chest from 08/26/2023. FINDINGS: Cardiovascular: No evidence of embolism to the proximal subsegmental pulmonary artery level. Top normal cardiac size. No pericardial effusion. No aortic aneurysm. There are coronary artery calcifications, in keeping with coronary artery disease. Mediastinum/Nodes: Visualized thyroid gland appears grossly unremarkable. No solid / cystic mediastinal masses. The esophagus is nondistended precluding optimal assessment. No axillary, mediastinal or hilar lymphadenopathy by size criteria. Lungs/Pleura: The central tracheo-bronchial tree is patent. There are patchy areas of linear, plate-like atelectasis and/or scarring throughout bilateral lungs. No mass or consolidation. No pleural effusion or pneumothorax. No suspicious lung nodules. Upper Abdomen: Visualized upper abdominal viscera within normal limits. Musculoskeletal: The visualized soft tissues of the chest wall are grossly unremarkable. No suspicious osseous lesions. There are mild multilevel degenerative changes in the visualized spine. Review of the MIP images confirms the above findings. IMPRESSION: 1. No embolism to the proximal subsegmental pulmonary artery level. 2. No lung mass, consolidation, pleural effusion or pneumothorax. 3. Other nonacute observations, as described above. Electronically Signed   By: Jules Schick M.D.   On: 08/27/2023 10:33   CT CHEST WO  CONTRAST Result Date: 08/26/2023 CLINICAL DATA:  Pneumonia, fever EXAM: CT CHEST WITHOUT CONTRAST TECHNIQUE: Multidetector CT imaging of the chest was performed following the standard protocol without IV contrast. RADIATION DOSE REDUCTION: This exam was performed according to the departmental dose-optimization program which includes automated exposure control, adjustment of the mA and/or kV  according to patient size and/or use of iterative reconstruction technique. COMPARISON:  Chest radiograph dated 08/26/2023 FINDINGS: Cardiovascular: The heart is top-normal in size. No pericardial effusion. No evidence of thoracic aortic aneurysm. Mild coronary atherosclerosis of the right coronary artery. Mediastinum/Nodes: No suspicious mediastinal lymphadenopathy. Lungs/Pleura: Evaluation of the lung parenchyma is constrained by respiratory motion. Within that constraint, there are no suspicious pulmonary nodules. Mild linear scarring in the bilateral lung apices. Mild linear scarring at the lateral right lung base. No focal consolidation. No pleural effusion or pneumothorax. Upper Abdomen: Visualized upper abdomen is notable for hepatic steatosis. Musculoskeletal: S shaped thoracolumbar scoliosis with degenerative changes. IMPRESSION: No acute cardiopulmonary disease. Specifically, no findings suspicious for pneumonia. Electronically Signed   By: Charline Bills M.D.   On: 08/26/2023 19:28   DG Chest 1 View Result Date: 08/26/2023 CLINICAL DATA:  Fever. EXAM: CHEST  1 VIEW COMPARISON:  07/10/2015 FINDINGS: Very low lung volumes limit assessment. Stable heart size, upper normal. Streaky opacity at the left lung base. No confluent consolidation. No pleural effusion or pneumothorax. IMPRESSION: Very low lung volumes limit assessment. Streaky opacity at the left lung base may be atelectasis or pneumonia. Electronically Signed   By: Narda Rutherford M.D.   On: 08/26/2023 18:00   DG Knee Right Port Result Date: 08/26/2023 CLINICAL DATA:  Right knee pain. EXAM: PORTABLE RIGHT KNEE - 1-2 VIEW COMPARISON:  Radiographs and CT scan of same day. FINDINGS: Single AP image of right knee demonstrates continued lateral dislocation of patella. IMPRESSION: Continued lateral dislocation of patella. Electronically Signed   By: Lupita Raider M.D.   On: 08/26/2023 14:52   CT Knee Right Wo Contrast Result Date:  08/26/2023 CLINICAL DATA:  Knee trauma.  Patellar dislocation. EXAM: CT OF THE RIGHT KNEE WITHOUT CONTRAST TECHNIQUE: Multidetector CT imaging of the right knee was performed according to the standard protocol. Multiplanar CT image reconstructions were also generated. RADIATION DOSE REDUCTION: This exam was performed according to the departmental dose-optimization program which includes automated exposure control, adjustment of the mA and/or kV according to patient size and/or use of iterative reconstruction technique. COMPARISON:  X-rays from earlier same day FINDINGS: Bones/Joint/Cartilage Lateral patellar dislocation evident with small to moderate joint effusion. Tiny cortical fragments medial to the dislocated patella are likely related to patellar avulsion injury and correspond to the curvilinear density seen on previous x-ray. There is an associated fracture involving the nonweightbearing cortex of the anterolateral right femoral condyle. No evidence for an acute fracture involving the tibial plateau or proximal fibula. There is some loss of joint space in the medial compartment. Ligaments Suboptimally assessed by CT. Muscles and Tendons The patellar tendon appears intact but a portion may be avulsed from the inferomedial patella. Quadriceps tendon appears intact but partial avulsion of the insertion cannot be excluded. Soft tissues Edema/hemorrhage is noted in the soft tissues about the anterior knee. IMPRESSION: 1. Lateral patellar dislocation with small to moderate joint effusion. 2. Tiny cortical fragments medial to the dislocated patella are likely related to patellar avulsion injury at the patellar tendon insertion and correspond to the curvilinear density seen on previous x-ray. 3. Associated fracture involving the nonweightbearing cortex of  the anterolateral right femoral condyle. 4. The patellar tendon appears intact but a portion may be avulsed from the medial patella. Avulsion of the medial  quadriceps tendon insertion not excluded. Electronically Signed   By: Kennith Center M.D.   On: 08/26/2023 07:39   DG Hip Unilat W or Wo Pelvis 2-3 Views Right Result Date: 08/26/2023 CLINICAL DATA:  Fall.  Hip pain. EXAM: DG HIP (WITH OR WITHOUT PELVIS) 2-3V RIGHT COMPARISON:  None Available. FINDINGS: There is no evidence of hip fracture or dislocation. There is no evidence of arthropathy or other focal bone abnormality. IMPRESSION: Negative. Electronically Signed   By: Kennith Center M.D.   On: 08/26/2023 05:52   DG Knee 2 Views Right Result Date: 08/26/2023 CLINICAL DATA:  Pain.  Possible patellar dislocation. EXAM: RIGHT KNEE - 1-2 VIEW COMPARISON:  None Available. FINDINGS: Study is limited by positioning and bony demineralization. Patella appears laterally dislocated and there is probably an associated displaced fracture involving the inferior pole of the patella. Oblique lateral projections shows a curvilinear focus of mineralization anterior to the patella, indeterminate but potentially related to cortical fracture fragment. Joint effusion suspected. IMPRESSION: Patella appears laterally dislocated and there is probably an associated displaced fracture involving the inferior pole of the patella. Curvilinear focus of mineralization anterior to the patella is indeterminate. CT imaging of the knee could be used for definitive characterization as clinically warranted. Electronically Signed   By: Kennith Center M.D.   On: 08/26/2023 05:51    Labs:  Basic Metabolic Panel: Recent Labs  Lab 09/09/23 1139  NA 136  K 4.5  CL 101  CO2 24  GLUCOSE 108*  BUN 20  CREATININE 0.53  CALCIUM 8.7*    CBC: No results for input(s): "WBC", "NEUTROABS", "HGB", "HCT", "MCV", "PLT" in the last 168 hours.   CBG: Recent Labs  Lab 09/13/23 2128 09/14/23 0604 09/14/23 1158 09/14/23 1612 09/14/23 2118  GLUCAP 152* 130* 169* 174* 102*    Brief HPI:   Alivea Gladson is a 28 y.o. right-handed  female with history of obesity BMI 44.88 as well as diabetes mellitus, Prater-Willi syndrome, intellectual delay per chart review patient lives with parents.  1 level home 5 steps to entry.  Patient does speak [place] but parents do not.  Family available as needed on discharge.  Presented 08/26/2023 after mechanical fall while getting out of her bed.  No loss of consciousness.  X-rays and imaging revealed right knee irreducible patellar dislocation right knee lateral femoral condyle impacting fracture as well as right knee medial patella impaction fracture.  Initial attempt at surgery 08/26/2023 placed on hold due to patient being tachycardic oxygen saturation dropping into the 80s on room air and low-grade fever 101.4.  CT of the chest showed no active cardiopulmonary disease no findings suspicious for pneumonia.  CT angiogram of the chest showed no embolism or lung mass.  Bilateral venous Doppler studies negative.  Fever did subside no white count elevation mild elevation of procalcitonin.  Negative for flu/COVID/RSV.  She was cleared for surgery underwent right knee arthroscopic assisted patellar reduction with lateral release.  Right knee arthroscopic loose body removal right knee open bands of lateral femoral condyle and medial patella fracture 08/27/2023 per Dr. Signa Kell.  Placed on aspirin 325 mg for DVT prophylaxis.  Weightbearing as tolerated right lower extremity knee immobilizer when out of bed.  Bouts of hyperkalemia 5.9 as well as hyponatremia.  Patient did receive Lokelma.  Therapy evaluations completed due to  patient decreased functional mobility was admitted for a comprehensive rehab program.   Hospital Course: Etsuko Dierolf [facility] was admitted to rehab 09/01/2023 for inpatient therapies to consist of PT, ST and OT at least three hours five days a week. Past admission physiatrist, therapy team and rehab RN have worked together to provide customized collaborative inpatient rehab.  Pertaining to  patient's right knee patellar dislocation.  Status post right knee arthroscopic assisted patellar reduction with lateral release.  Right knee arthroscopic loose body removal right knee open management of lateral femoral condyle and medial patella fracture 08/27/2023.  Weightbearing as tolerated and knee immobilizer when out of bed or when ambulating.  Neurovascular sensation intact.  Maintained on aspirin 325 mg daily x 2 weeks for DVT prophylaxis venous Doppler studies negative.  Pain management with use of Neurontin scheduled as well as Naprosyn.  Oxycodone for breakthrough pain.  Patient with history of Prader Willi syndrome.  Patient lives with parents independent prior to admission.  Diabetes mellitus hemoglobin A1c 9.5 insulin therapy as well as ongoing Jardiance and Glucophage as directed with diabetic teaching and recommend outpatient follow-up.  Blood pressure remained a bit soft and she continued her Toprol however her lisinopril was held and would need outpatient follow-up.  Obesity BMI 44.88 dietary follow-up.   Blood pressures were monitored on TID basis and monitored and controlled  Diabetes has been monitored with ac/hs CBG checks and SSI was use prn for tighter BS control.    Rehab course: During patient's stay in rehab weekly team conferences were held to monitor patient's progress, set goals and discuss barriers to discharge. At admission, patient required max assist squat pivot transfers minimal assist ambulation 2 feet  Physical exam.  Blood pressure 118/70 pulse 80 temperature 98 respirations 18 oxygen saturation is 94% room air Constitutional.  No acute distress HEENT Head.  Normocephalic and atraumatic Eyes.  Pupils round and reactive to light no discharge without nystagmus Neck.  Supple nontender no JVD without thyromegaly Cardiac regular rate and rhythm without any extra sounds or murmur heard Abdomen.  Soft nontender positive bowel sounds without rebound Respiratory effort  normal no respiratory distress without wheeze Skin.  Right lower extremity knee immobilizer in place Neurologic.  Alert and oriented x 2 answering basic questions  He/She  has had improvement in activity tolerance, balance, postural control as well as ability to compensate for deficits. He/She has had improvement in functional use RUE/LUE  and RLE/LLE as well as improvement in awareness.  Sessions focused on gait training, transfer training and increasing endurance.  Complete sit to stand contact-guard rolling walker ambulates rolling walker 45 feet with contact-guard demonstrating improved gait mechanics.  She continued demonstrate some antalgic gait with decreased right lower extremity stance.  Required extended rest breaks at times.  Patient completed all activities for ADLs with close supervision and no loss of balance.  Full family teaching completed plan discharge to home       Disposition:  Discharge disposition: 06-Home-Health Care Svc        Diet: Diabetic diet  Special Instructions: No driving smoking or alcohol  Medications at discharge. 1.  Tylenol as needed 2.  Vitamin D 50,000 units every 7 days 3.  Colace 100 mg p.o. twice daily 4.  Neurontin 100 mg twice daily and 300 mg nightly 5.  NovoLog 12 units 3 times daily with meals 6.  Lantus insulin 60 units twice daily 7.  Synthroid 50 mcg p.o. daily 8.  Melatonin 5 mg nightly  as needed sleep 9.  Glucophage 1000 mg p.o. twice daily 10.  Robaxin 500 mg p.o. 3 times daily as needed 11.  Toprol-XL 25 mg p.o. daily 12.  Multivitamin daily 13.  Jardiance 25 mg daily 14.  Tramadol 50 mg every 6 hours as needed pain 15.  Prilosec 40 mg daily 16.  MiraLAX daily hold for loose stools 17.  Voltaren gel 2 g 4 times daily    30-35 minutes are spent completing discharge summary and discharge planning     Follow-up Information     Angelina Sheriff, DO Follow up.   Specialty: Physical Medicine and Rehabilitation Why: No  formal follow-up needed Contact information: [address] [place] [address] [phone]         Signa Kell, MD Follow up.   Specialty: Orthopedic Surgery Why: Call for appointment Contact information: [address] [place] [address] [phone]                 Signed: Mcarthur Rossetti Mindie Rawdon 09/15/2023, 5:20 AM

## 2023-09-03 NOTE — Progress Notes (Signed)
 Physical Therapy Session Note  Patient Details  Name: Linda Mason MRN: [id number] Date of Birth: 05-10-1996  Today's Date: 09/03/2023 PT Individual Time: 3086-5784 + 6962-9528 PT Individual Time Calculation (min): 53 min + 72 min   Short Term Goals: Week 1:  PT Short Term Goal 1 (Week 1): Pt will complete bed mobility with CGA PT Short Term Goal 2 (Week 1): Pt will complete transfer with LRAD with min assist PT Short Term Goal 3 (Week 1): Pt will ambulate 20' with min assist and 2nd person WC follow  Skilled Therapeutic Interventions/Progress Updates:    SESSION 1: Pt presents in room in [facility] with pt mother, interpreter, and MD present at start of session. Pt motivated to participate with PT but requesting to go to "dance party." Session focused on therapeutic activities with therapeutic use of self to promote improved participation with therapies.  Pt transported to main gym and positioned in //bars for gait training. Pt completes sit<>stand with CGA, reporting pain in RLE requires max encouragement to participate with AAROM SLR for RLE. Pt prompted to trial gait in //bars however pt declines and requests to use RW instead. Pt transported to day room dependently for energy conservation. Pt completes stand to RW with CGA, reporting pain in RLE and begins crying. Therapist uses therapeutic use of self with music and dancing to improve motivation and participation. Pt attempts to stand x2 more attempts however pt unable to take a step due to pain.  Pt then transported to main gym and completes ball toss activity with therapist in front of and to the side of patient to facilitate improved activity tolerance with BUEs as well as core strengthening, pt completes x20 reps.  Pt provided with seated rest breaks between all activities to promote improved participation with tasks, music used as tool to decrease behavioral outbursts.  Pt returns to room and remains seated in [facility] with all needs  within reach, cal light in place and pt mother at bedside at end of session.   SESSION 2: Pt presents in room in [facility] with pt mother and interpreter present at start of session. Pt mother remains in room during session due to pt mother reporting pt with decreased participation with her present. Pt requesting pain medication, RN present at beginning of session to administer. Session focused on NMR for dynamic standing balance and weightbearing tolerance and therapeutic activity for transfer training and initiating gait training.  Pt transported to day room dependently for time management. Therapist plays music throughout session to improve pt mood and participation. Pt completes stand to RW with CGA and participates with NMR for tossing bean bags at [employer] with LUE for improving dynamic standing balance, upright tolerance,and RLE weightbearing, completes 2x3 minutes in standing and following activity pt initiates ambulation with min assist walking 2x3' with WC follow. Pt reporting decreased RLE pain this session following pain medication administration.  Pt completes stand step transfer with min assist and max verbal cues for sequencing, pt prefers to use LUE on front bar of RW despite verbal cues during RLE stance phase. Pt able to sequence stand step transfer with significant increase in time. Once seated on EOM pt remains in unsupported sitting with supervision, compeltes ball toss activity for challenge to core strength and sitting balance, completes x30 reps. Pt completes stand step transfer to L back to [facility] with RW min assist, mod verbal cues and increased time.  Pt handoff to OT in day room at end of session.  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

## 2023-09-03 NOTE — IPOC Note (Signed)
 Overall Plan of Care [facility]) Patient Details Name: Linda Mason MRN: [id number] DOB: [birth year]  Admitting Diagnosis: Closed patellar dislocation, right, initial encounter  Hospital Problems: Principal Problem:   Closed patellar dislocation, right, initial encounter     Functional Problem List: Nursing Bladder, Endurance, Medication Management, Pain, Safety, Skin Integrity  PT Behavior, Balance, Endurance, Motor, Nutrition, Pain, Perception, Safety, Sensory, Edema  OT Balance, Behavior, Cognition, Edema, Endurance, Motor, Pain, Safety  SLP    TR         Basic ADL's: OT Bathing, Dressing, Toileting     Advanced  ADL's: OT       Transfers: PT Bed Mobility, Bed to Chair, Car  OT Toilet, Tub/Shower     Locomotion: PT Ambulation, Psychologist, prison and probation services, Stairs     Additional Impairments: OT None  SLP        TR      Anticipated Outcomes Item Anticipated Outcome  Self Feeding Set up A  Swallowing      Basic self-care  Min A  Toileting  CGA   Bathroom Transfers CGA  Bowel/Bladder  manage bowels with medications, manage bladder with time toileting  Transfers  supervision  Locomotion  supervisoin ambulatory  Communication     Cognition     Pain  <4 w/ prns  Safety/Judgment  manage safety with minimal assistance   Therapy Plan: PT Intensity: Minimum of 1-2 x/day ,45 to 90 minutes PT Frequency: 5 out of 7 days PT Duration Estimated Length of Stay: 12-14 days OT Intensity: Minimum of 1-2 x/day, 45 to 90 minutes OT Frequency: 5 out of 7 days OT Duration/Estimated Length of Stay: 2 weeks     Team Interventions: Nursing Interventions Patient/Family Education, Medication Management, Bladder Management, Bowel Management, Disease Management/Prevention, Pain Management, Discharge Planning, Cognitive Remediation/Compensation, Skin Care/Wound Management  PT interventions Ambulation/gait training, Discharge planning, Functional mobility training,  Psychosocial support, Therapeutic Activities, Balance/vestibular training, Disease management/prevention, Neuromuscular re-education, Therapeutic Exercise, Wheelchair propulsion/positioning, DME/adaptive equipment instruction, Cognitive remediation/compensation, Pain management, Splinting/orthotics, UE/LE Strength taining/ROM, Community reintegration, Equities trader education, Museum/gallery curator, UE/LE Coordination activities  OT Interventions Warden/ranger, Discharge planning, Pain management, Self Care/advanced ADL retraining, Therapeutic Activities, UE/LE Coordination activities, Cognitive remediation/compensation, Disease mangement/prevention, Functional mobility training, Patient/family education, Skin care/wound managment, Therapeutic Exercise, DME/adaptive equipment instruction, Neuromuscular re-education, Wheelchair propulsion/positioning, UE/LE Strength taining/ROM  SLP Interventions    TR Interventions    SW/CM Interventions Psychosocial Support, Patient/Family Education, Discharge Planning   Barriers to Discharge MD  Medical stability, Home enviroment access/loayout, Lack of/limited family support, Weight, Weight bearing restrictions, Medication compliance, and Behavior  Nursing Decreased caregiver support, Home environment access/layout, Weight bearing restrictions Home Access: Stairs to enter  Entrance Stairs-Number of Steps: 5  Home Layout: One level  PT Inaccessible home environment, Weight, Behavior, Weight bearing restrictions 5 STE with pt able to tolerate very minimal weightbearing at this time  OT Inaccessible home environment, Wound Care    SLP      SW Decreased caregiver support, Lack of/limited family support, Weight bearing restrictions, Insurance for SNF coverage     Team Discharge Planning: Destination: PT-Home ,OT- Home , SLP-  Projected Follow-up: PT-Outpatient PT, OT-  Home health OT, 24 hour supervision/assistance, SLP-  Projected Equipment Needs: PT-To  be determined, OT- To be determined, SLP-  Equipment Details: PT- , OT-  Patient/family involved in discharge planning: PT- Patient,  OT-Patient, Family member/caregiver, SLP-   MD ELOS: 10-14 days Medical Rehab Prognosis:  Good Assessment: The patient has been admitted for CIR  therapies with the diagnosis of patellar dislocation. The team will be addressing functional mobility, strength, stamina, balance, safety, adaptive techniques and equipment, self-care, bowel and bladder mgt, patient and caregiver education,. Goals have been set at [facility] A PT/OT. Anticipated discharge destination is home.       See Team Conference Notes for weekly updates to the plan of care

## 2023-09-04 DIAGNOSIS — S83004A Unspecified dislocation of right patella, initial encounter: Secondary | ICD-10-CM | POA: Diagnosis not present

## 2023-09-04 LAB — CBC WITH DIFFERENTIAL/PLATELET
Abs Immature Granulocytes: 0.11 10*3/uL — ABNORMAL HIGH (ref 0.00–0.07)
Basophils Absolute: 0.1 10*3/uL (ref 0.0–0.1)
Basophils Relative: 1 %
Eosinophils Absolute: 0.2 10*3/uL (ref 0.0–0.5)
Eosinophils Relative: 3 %
HCT: 36.9 % (ref 36.0–46.0)
Hemoglobin: 12.1 g/dL (ref 12.0–15.0)
Immature Granulocytes: 2 %
Lymphocytes Relative: 33 %
Lymphs Abs: 1.8 10*3/uL (ref 0.7–4.0)
MCH: 31.1 pg (ref 26.0–34.0)
MCHC: 32.8 g/dL (ref 30.0–36.0)
MCV: 94.9 fL (ref 80.0–100.0)
Monocytes Absolute: 0.8 10*3/uL (ref 0.1–1.0)
Monocytes Relative: 15 %
Neutro Abs: 2.6 10*3/uL (ref 1.7–7.7)
Neutrophils Relative %: 46 %
Platelets: 73 10*3/uL — ABNORMAL LOW (ref 150–400)
RBC: 3.89 MIL/uL (ref 3.87–5.11)
RDW: 14.6 % (ref 11.5–15.5)
WBC: 5.6 10*3/uL (ref 4.0–10.5)
nRBC: 0 % (ref 0.0–0.2)

## 2023-09-04 LAB — BASIC METABOLIC PANEL WITH GFR
Anion gap: 11 (ref 5–15)
BUN: 21 mg/dL — ABNORMAL HIGH (ref 6–20)
CO2: 27 mmol/L (ref 22–32)
Calcium: 9 mg/dL (ref 8.9–10.3)
Chloride: 94 mmol/L — ABNORMAL LOW (ref 98–111)
Creatinine, Ser: 0.47 mg/dL (ref 0.44–1.00)
GFR, Estimated: >60 mL/min (ref >60)
Glucose, Bld: 300 mg/dL — ABNORMAL HIGH (ref 70–99)
Potassium: 4.6 mmol/L (ref 3.5–5.1)
Sodium: 132 mmol/L — ABNORMAL LOW (ref 135–145)

## 2023-09-04 LAB — GLUCOSE, CAPILLARY
Glucose-Capillary: 255 mg/dL — ABNORMAL HIGH (ref 70–99)
Glucose-Capillary: 247 mg/dL — ABNORMAL HIGH (ref 70–99)
Glucose-Capillary: 324 mg/dL — ABNORMAL HIGH (ref 70–99)
Glucose-Capillary: 264 mg/dL — ABNORMAL HIGH (ref 70–99)

## 2023-09-04 MED ORDER — INSULIN GLARGINE 100 UNIT/ML ~~LOC~~ SOLN
50.0000 [IU] | Freq: Two times a day (BID) | SUBCUTANEOUS | Status: DC
  Administered 2023-09-04 – 2023-09-05 (×4): 50 [IU] via SUBCUTANEOUS
  Filled 2023-09-04 (×6): qty 0.5

## 2023-09-04 MED ORDER — INSULIN ASPART 100 UNIT/ML IJ SOLN
13.0000 [IU] | Freq: Three times a day (TID) | INTRAMUSCULAR | Status: DC
  Administered 2023-09-04 – 2023-09-06 (×6): 13 [IU] via SUBCUTANEOUS

## 2023-09-04 MED ORDER — INSULIN GLARGINE 100 UNIT/ML ~~LOC~~ SOLN
46.0000 [IU] | Freq: Two times a day (BID) | SUBCUTANEOUS | Status: DC
  Filled 2023-09-04 (×2): qty 0.46

## 2023-09-04 NOTE — Progress Notes (Signed)
 Occupational Therapy Session Note  Patient Details  Name: Linda Mason MRN: [id number] Date of Birth: 1996-06-28  Today's Date: 09/04/2023 OT Individual Time: 0815-0920 OT Individual Time Calculation (min): 65 min    Short Term Goals: Week 1:  OT Short Term Goal 1 (Week 1): Pt will complete sit > stand in prep for ADL with min A using LRAD OT Short Term Goal 2 (Week 1): Pt will complete 1/3 toileting steps with CGA for balance OT Short Term Goal 3 (Week 1): Pt will complete toilet transfer with mod A using LRAD  Skilled Therapeutic Interventions/Progress Updates:    Pt resting in bed upon arrival with father present and interpreter present for father. Pt requesting to take a shower. RLE KI donned and covered with plastic bags. Supine>sit EOB with min A using bed functions. Pt able to scoot to EOB to place feet on Stedy. Sit<>stand from [facility] with min A at EOB and TTB before and after shower. Pt completed bathing with max A seated on TTB. Transfer back to bed with Stedy. Sit>supine with mod A for BLE mgmt. Dependent for repositioning in bed. Focus throughout session on bed mobility, sit<>stand in [place], standing balance in [place], and safety awareness to increase pt's independence with BADLs. Pt remained in bed with all needs within reach and bed alarm activated. Nursing staff notified that pt needed assistance to complete dressing since unable to complete 2/2 time constraints.   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

## 2023-09-04 NOTE — Progress Notes (Signed)
 Physical Therapy Session Note  Patient Details  Name: Linda Mason MRN: [id number] Date of Birth: 28-Jan-1996  Today's Date: 09/04/2023 PT Individual Time: 0950-1045 PT Individual Time Calculation (min): 55 min   Short Term Goals: Week 1:  PT Short Term Goal 1 (Week 1): Pt will complete bed mobility with CGA PT Short Term Goal 2 (Week 1): Pt will complete transfer with LRAD with min assist PT Short Term Goal 3 (Week 1): Pt will ambulate 20' with min assist and 2nd person WC follow  Skilled Therapeutic Interventions/Progress Updates:    Handoff from nursing who was assisting pt with toileting using stedy. Pt still needing to get dressed. Suggested use of RW for sit > stands but pt declined despite stating that was what she wanted to work on in the gym. Requesting use of stedy only despite education on not having a stedy at home. Total assist to don pants and then min assist with stedy for sit > stand and pt assisting some to pull up. (Reapplied KI under pants prior to standing). Pt c/o pain with the way RLE on legrest and educated on it needing to be straight and not down due to the KI. Transported to gym for time management. Focused on sit > stand with RW (seems very low, so will need to adjust height but pt did not stand again after first attempt) with min assist with cues for technique. In standing with min assist focused on achieving upright balance and weightshifting and then pt attempting to take a step without cues by therapist, she then states she can't and sits quickly into the chair due to stating her knee was in pain. Elevated RLE and cues for deep breathing as pt with increased work of breathing. Took a few minutes but able to calm down and transitioned to seated UE therex due to continued c/o of her RLE hurting. Utilized 5 # straight weight for bicep curls, forward press and overhead press with mod cues needed for technique for each rep. Pt distractible when engaging in conversation,  so decreased this to allow her to focus. End of session returned to room with all needs in reach.  Medical interpreter present during session.   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

## 2023-09-04 NOTE — Progress Notes (Signed)
 Physical Therapy Session Note  Patient Details  Name: Linda Mason MRN: [id number] Date of Birth: 17-Feb-1996  Today's Date: 09/04/2023 PT Individual Time: 1329-1355 PT Individual Time Calculation (min): 26 min   Short Term Goals: Week 1:  PT Short Term Goal 1 (Week 1): Pt will complete bed mobility with CGA PT Short Term Goal 2 (Week 1): Pt will complete transfer with LRAD with min assist PT Short Term Goal 3 (Week 1): Pt will ambulate 20' with min assist and 2nd person WC follow  Skilled Therapeutic Interventions/Progress Updates:   Pt presents up in w/c eating lunch requesting to defer session. Came back and pt agreeable. Pt initially agreeable to work on sit <> stands with RW but then when getting set up, she declines and states her knee hurts and she does not want to. Performed seated activity with BLE supported and no back support in w/c to focus on anterior weightshift, functional dynamic sitting balance and core strengthening while performing game of Connect 4 (best of 3) - pt states she does not know game previously but able to follow. Declines again at end of session for standing attempt. Better able to scoot back and reposition in w/c than this morning. End of session returned to room with all needs in reach and dad at bedside.  Medical interpreter present during session.  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   Therapy/Group: Individual Therapy  Karolee Stamps Darrol Poke, PT, DPT, CBIS  09/04/2023, 1:59 PM

## 2023-09-04 NOTE — Progress Notes (Signed)
 PROGRESS NOTE   Subjective/Complaints:  Appreciate diabetic coordinator recommendations, ordered Patient has no complaints this morning Working in gym with therapy  Stable vitals  ROS: Denies fevers, chills, N/V, abdominal pain, constipation, diarrhea, SOB, cough, chest pain, new weakness or paraesthesias.   + LLE pain--ongoing, slightly improved + poor sleep--ongoing  Objective:   No results found. Recent Labs    09/02/23 1058 09/04/23 1119  WBC 5.5 5.6  HGB 11.5* 12.1  HCT 35.2* 36.9  PLT 56* 73*   Recent Labs    09/03/23 0654 09/03/23 1526 09/04/23 1119  NA 132*  --  132*  K 6.0* 5.1 4.6  CL 97*  --  94*  CO2 33*  --  27  GLUCOSE 256*  --  300*  BUN 23*  --  21*  CREATININE 0.41*  --  0.47  CALCIUM 8.6*  --  9.0    Intake/Output Summary (Last 24 hours) at 09/04/2023 1834 Last data filed at 09/04/2023 1700 Gross per 24 hour  Intake 797 ml  Output --  Net 797 ml        Physical Exam: Vital Signs Blood pressure 115/81, pulse 100, temperature (!) 97.3 F (36.3 C), temperature source Oral, resp. rate 16, weight 107.2 kg, SpO2 95%. Constitutional: No apparent distress. Appropriate appearance for age.  Morbidly obese. HENT: No JVD. Neck Supple. Trachea midline. Atraumatic, normocephalic. Eyes: PERRLA. EOMI. Visual fields grossly intact.  Cardiovascular: RRR, no murmurs/rub/gallops. No Edema. Peripheral pulses 2+  Respiratory: CTAB. No rales, rhonchi, or wheezing.  On room air. Abdomen: + bowel sounds, normoactive. No distention or tenderness.  Skin: C/D/I. + New appearance of rash along right face, bilateral elbows; urticarial  MSK:     Apparent deformities.  Right lower extremity extension brace.=  Neurologic exam:  Cognition: AAO to person, place, time and event.  Follows all simple commands. + Baseline higher-level cognitive deficits in insight and attention  Language: Fluent, No substitutions  or neoglisms. No dysarthria. Names 3/3 objects correctly.  Memory: No apparent deficits  Insight: Poor insight into current condition.  Mood: Pleasant affect, appropriate mood.  Sensation, reflexes, cranial nerves, coordination, and tone without apparent deficits.        Strength:                RUE: 5/5 SA, 5/5 EF, 5/5 EE, 5/5 WE, 5/5 FF, 5/5 FA                 LUE: 5/5 SA, 5/5 EF, 5/5 EE, 5/5 WE, 5/5 FF, 5/5 FA                 RLE: Limited due to extension brace; 1/5 HF, 2/5 DF, 2/5 PF due to pain                LLE:  2/5 HF, 4/5 KE, 5/5 DF, 5/5 EHL, 5/5 PF --limited by pain, stable 3/1     Assessment/Plan: 1. Functional deficits which require 3+ hours per day of interdisciplinary therapy in a comprehensive inpatient rehab setting. Physiatrist is providing close team supervision and 24 hour management of active medical problems listed below. Physiatrist and rehab team continue to  assess barriers to discharge/monitor patient progress toward functional and medical goals  Care Tool:  Bathing    Body parts bathed by patient: Right arm, Left arm, Chest, Abdomen   Body parts bathed by helper: Front perineal area, Buttocks, Right upper leg, Left upper leg, Right lower leg, Left lower leg Body parts n/a: Right upper leg, Right lower leg   Bathing assist Assist Level: Maximal Assistance - Patient 24 - 49%     Upper Body Dressing/Undressing Upper body dressing   What is the patient wearing?: Pull over shirt    Upper body assist Assist Level: Minimal Assistance - Patient > 75%    Lower Body Dressing/Undressing Lower body dressing      What is the patient wearing?: Incontinence brief     Lower body assist Assist for lower body dressing: 2 Helpers     Toileting Toileting    Toileting assist Assist for toileting: 2 Helpers     Transfers Chair/bed transfer  Transfers assist     Chair/bed transfer assist level: Minimal Assistance - Patient > 75%      Locomotion Ambulation   Ambulation assist   Ambulation activity did not occur: Safety/medical concerns          Walk 10 feet activity   Assist  Walk 10 feet activity did not occur: Safety/medical concerns        Walk 50 feet activity   Assist Walk 50 feet with 2 turns activity did not occur: Safety/medical concerns         Walk 150 feet activity   Assist Walk 150 feet activity did not occur: Safety/medical concerns         Walk 10 feet on uneven surface  activity   Assist Walk 10 feet on uneven surfaces activity did not occur: Safety/medical concerns         Wheelchair     Assist Is the patient using a wheelchair?: Yes Type of Wheelchair: Manual    Wheelchair assist level: Dependent - Patient 0%      Wheelchair 50 feet with 2 turns activity    Assist        Assist Level: Dependent - Patient 0%   Wheelchair 150 feet activity     Assist      Assist Level: Dependent - Patient 0%   Blood pressure 115/81, pulse 100, temperature (!) 97.3 F (36.3 C), temperature source Oral, resp. rate 16, weight 107.2 kg, SpO2 95%.  Medical Problem List and Plan: 1. Functional deficits secondary to right knee patellar dislocation.  Status post right knee arthroscopic assisted patellar reduction with lateral release.  Right knee arthroscopic loose body removal right knee open management of lateral femoral condyle and medial patella fracture 08/27/2023.  Weightbearing as tolerated.  Knee immobilizer when out of bed or when ambulating.             -patient may shower             -ELOS/Goals: 10-14 days MinA/S              -stable to continue CIR   2.  Antithrombotics: -DVT/anticoagulation:  Mechanical: Antiembolism stockings, thigh (TED hose) Bilateral lower extremities.  Venous Dopplers negative             -antiplatelet therapy: continue Aspirin 325 mg daily x 2 weeks   3. Pain Management: continue Neurontin 100 mg 3 times daily Robaxin 500  mg 3 times daily, Naprosyn 500 mg twice daily, oxycodone as needed  -  2-27: Poor pain control interrupting sleep and very limiting for therapy progress.  Increase regimen to scheduled Tylenol 1000 mg 3 times daily, Robaxin 750 mg 3 times daily, and scheduled oxycodone 5 mg 3 times daily in addition to 5 mg every 4 hours as needed.  2-28: Improvement in pain control with the above measures, does well with premedicating therapies with PRNs.  Increase nightly gabapentin to 300 mg, can consider switching scheduled oxycodone to long-acting OxyContin 10 mg twice daily over the weekend if no improvement (discussed this with mother/therapies, patient will endorse severe pain regardless)   4. Mood/Behavior/Sleep: Provide emotional support             -antipsychotic agents: N/A  -2-27: Poor sleep due to right leg pain.  Pain control as above, add as needed melatonin   5. Neuropsych/cognition: This patient is not capable of making decisions on her own behalf.   6. Skin/Wound Care/RASH new 2/28: Routine skin checks  -2-28: Nursing checking with mom regarding any prior medication reactions.  No recently started medications (noticed before AM lasix).  Continue as needed Benadryl.   7. Fluids/Electrolytes/Nutrition: Routine in and outs with follow-up chemistries    - K 5.9, Na 132, BUN 62 - IVF at 100 cc/hr 2/26  - 2-28: BUN/creatinine improved with IV fluids, remains hyperglycemic and with potassium up to 6.0; increase insulin as below, continue IV fluids, give Lasix 40 mg IV today and retest potassium this afternoon.   -- Responsive to Lasix, repeat 5.1. Stop IVF.  Labs in AM ordered K+ reviewed and normalized   8.  Prader-Willi syndrome.  Patient lives with parents independent prior to admission   -Will get behavioral plan in place to assist with staff understanding patient's cognitive baseline and motivation; per mom, does well with food motivators and puzzles/coloring books.  Would use food sparingly given  uncontrolled diabetes.  9.  Diabetes mellitus.  Hemoglobin A1c 9.5.  continue NovoLog 6 units 3 times daily, Semglee 55 units nightly.  Glycemia likely complicated by poor pain control   - home regimen per chart review endocrinology 06/22/23: Basal insulin: Continue Lantus to 50 units daily Prandial insulin: Continue Novolog 12 units with meals Correctional insulin: Continue Novolog 2: 50 > 150 with meals and the half dose before bed Non-insulin agents: - tirzepatide 5 mg weekly, increase as able (is currently finishing out their last ozempic pen before switching) - Continue metformin ER 1000 mg bid - Continue empagliflozin 25 mg daily    - Per mom, baseline blood sugar between 150 and 300    - Was switched to semglee 32 U BID yesterday; IV fluids today   2/28: Resume home metformin, hold Jardiance due to hyperkalemia, increase Lantus to 45 units twice daily  Regimen adjusted as per diabetic coordinator recommendations  Recent Labs    09/04/23 0602 09/04/23 1135 09/04/23 1628  GLUCAP 255* 247* 324*     10.  Hypertension.  Continue Lisinopril 40 mg daily, Toprol-XL 25 mg daily    - normotensive    09/04/2023    2:10 PM 09/04/2023    8:19 AM 09/04/2023    6:05 AM  Vitals with BMI  Weight   236 lbs 5 oz  BMI   43.21  Systolic 115 121 161  Diastolic 81 71 74  Pulse 100 73 72    11.  Morbid obesity.  BMI 44.88.  Dietary follow-up.  Complicated by significant food motivation due to Prader-Willi syndrome.    LOS: 3  days A FACE TO FACE EVALUATION WAS PERFORMED  Horton Chin 09/04/2023, 6:34 PM

## 2023-09-04 NOTE — Inpatient Diabetes Management (Signed)
 Inpatient Diabetes Program Recommendations  AACE/ADA: New Consensus Statement on Inpatient Glycemic Control  Target Ranges:  Prepandial:   less than 140 mg/dL      Peak postprandial:   less than 180 mg/dL (1-2 hours)      Critically ill patients:  140 - 180 mg/dL    Latest Reference Range & Units 09/03/23 06:28 09/03/23 12:08 09/03/23 16:31 09/03/23 20:59 09/04/23 06:02  Glucose-Capillary 70 - 99 mg/dL 782 (H) 956 (H) 213 (H) 305 (H) 255 (H)   Review of Glycemic Control  Diabetes history: DM2 Outpatient Diabetes medications: Semglee 32 units BID, Novolog 4 units TID with meals, Jardiance 25 mg daily, Novolog 0-20 units TID with, Metformin 1000 mg BID Current orders for Inpatient glycemic control: Lantus 46 units BID, Novolog 0-20 units TID with meals, Novolog 10 units TID with meals, Metformin 1000 mg BID  Inpatient Diabetes Program Recommendations:    Insulin: Please consider increasing Lantus 50 units BID and meal coverage to Novolog 13 units TID with meals.  Thanks, Orlando Penner, RN, MSN, CDCES Diabetes Coordinator Inpatient Diabetes Program [phone] (Team Pager from 8am to 5pm)

## 2023-09-05 LAB — GLUCOSE, CAPILLARY
Glucose-Capillary: 222 mg/dL — ABNORMAL HIGH (ref 70–99)
Glucose-Capillary: 228 mg/dL — ABNORMAL HIGH (ref 70–99)
Glucose-Capillary: 353 mg/dL — ABNORMAL HIGH (ref 70–99)
Glucose-Capillary: 326 mg/dL — ABNORMAL HIGH (ref 70–99)

## 2023-09-05 MED ORDER — SALINE SPRAY 0.65 % NA SOLN
1.0000 | NASAL | Status: DC | PRN
Start: 2023-09-05 — End: 2023-09-16
  Administered 2023-09-05: 1 via NASAL
  Filled 2023-09-05: qty 44

## 2023-09-06 DIAGNOSIS — S83004A Unspecified dislocation of right patella, initial encounter: Secondary | ICD-10-CM | POA: Diagnosis not present

## 2023-09-06 LAB — CBC WITH DIFFERENTIAL/PLATELET
Abs Immature Granulocytes: 0.34 10*3/uL — ABNORMAL HIGH (ref 0.00–0.07)
Basophils Absolute: 0.1 10*3/uL (ref 0.0–0.1)
Basophils Relative: 1 %
Eosinophils Absolute: 0.2 10*3/uL (ref 0.0–0.5)
Eosinophils Relative: 3 %
HCT: 38.9 % (ref 36.0–46.0)
Hemoglobin: 12.7 g/dL (ref 12.0–15.0)
Immature Granulocytes: 5 %
Lymphocytes Relative: 32 %
Lymphs Abs: 2 10*3/uL (ref 0.7–4.0)
MCH: 31.3 pg (ref 26.0–34.0)
MCHC: 32.6 g/dL (ref 30.0–36.0)
MCV: 95.8 fL (ref 80.0–100.0)
Monocytes Absolute: 0.8 10*3/uL (ref 0.1–1.0)
Monocytes Relative: 13 %
Neutro Abs: 2.9 10*3/uL (ref 1.7–7.7)
Neutrophils Relative %: 46 %
Platelets: 65 10*3/uL — ABNORMAL LOW (ref 150–400)
RBC: 4.06 MIL/uL (ref 3.87–5.11)
RDW: 15 % (ref 11.5–15.5)
WBC: 6.2 10*3/uL (ref 4.0–10.5)
nRBC: 0 % (ref 0.0–0.2)

## 2023-09-06 LAB — BASIC METABOLIC PANEL
Anion gap: 10 (ref 5–15)
BUN: 19 mg/dL (ref 6–20)
CO2: 27 mmol/L (ref 22–32)
Calcium: 9 mg/dL (ref 8.9–10.3)
Chloride: 92 mmol/L — ABNORMAL LOW (ref 98–111)
Creatinine, Ser: 0.49 mg/dL (ref 0.44–1.00)
GFR, Estimated: >60 mL/min (ref >60)
Glucose, Bld: 171 mg/dL — ABNORMAL HIGH (ref 70–99)
Potassium: 4.4 mmol/L (ref 3.5–5.1)
Sodium: 129 mmol/L — ABNORMAL LOW (ref 135–145)

## 2023-09-06 LAB — GLUCOSE, CAPILLARY
Glucose-Capillary: 258 mg/dL — ABNORMAL HIGH (ref 70–99)
Glucose-Capillary: 176 mg/dL — ABNORMAL HIGH (ref 70–99)
Glucose-Capillary: 208 mg/dL — ABNORMAL HIGH (ref 70–99)
Glucose-Capillary: 260 mg/dL — ABNORMAL HIGH (ref 70–99)

## 2023-09-06 MED ORDER — INSULIN GLARGINE 100 UNIT/ML ~~LOC~~ SOLN
60.0000 [IU] | Freq: Two times a day (BID) | SUBCUTANEOUS | Status: DC
  Administered 2023-09-06 – 2023-09-08 (×5): 60 [IU] via SUBCUTANEOUS
  Filled 2023-09-06 (×8): qty 0.6

## 2023-09-06 MED ORDER — INSULIN ASPART 100 UNIT/ML IJ SOLN
15.0000 [IU] | Freq: Three times a day (TID) | INTRAMUSCULAR | Status: DC
  Administered 2023-09-06 – 2023-09-08 (×7): 15 [IU] via SUBCUTANEOUS

## 2023-09-06 NOTE — Progress Notes (Signed)
 Occupational Therapy Session Note  Patient Details  Name: Linda Mason [facility] MRN: [id number] Date of Birth: 04-21-1996  Today's Date: 09/06/2023 OT Individual Time: 1100-1200 OT Individual Time Calculation (min): 60 min    Short Term Goals: Week 1:  OT Short Term Goal 1 (Week 1): Pt will complete sit > stand in prep for ADL with min A using LRAD OT Short Term Goal 2 (Week 1): Pt will complete 1/3 toileting steps with CGA for balance OT Short Term Goal 3 (Week 1): Pt will complete toilet transfer with mod A using LRAD  Skilled Therapeutic Interventions/Progress Updates:      Therapy Documentation Precautions:  Precautions Precautions: Knee Precaution/Restrictions Comments: Prader-willi syndrome Required Braces or Orthoses: Knee Immobilizer - Right Knee Immobilizer - Right: On when out of bed or walking Restrictions Weight Bearing Restrictions Per Provider Order: Yes RLE Weight Bearing Per Provider Order: Weight bearing as tolerated General: "I like [employer]" Pt seated in W/C upon OT arrival, agreeable to OT.  Pain: no pain reported, reports medicated prior to therapy  Balance Pt completed a variety of seated and standing activities in order to promote increased sitting and standing balance strategies with ADL participation. Pt completed all activities at W/C or standing at [place] level with RW with higher level balance activities with intermittent supported/unsupported with unilateral UE standing at RW. Pt able to reach with BUE out of BOS without LOB. Pt completed exercises listed below: -standing at [facility] system for various trials for various games, seated rest breaks in between standing trials -seated in W/C working on dynamic sitting playing card game, reaching out of BOS with no LOB  Pt with VC for outstretched leg when lowing to sit in order to prevent bending. Pt may need reinforcement to decrease pain.   Pt seated in W/C at end of session with W/C alarm donned, call light  within reach and 4Ps assessed.    Therapy/Group: Individual Therapy  Velia Meyer, OTD, OTR/L 09/06/2023, 12:47 PM

## 2023-09-06 NOTE — Progress Notes (Signed)
 Occupational Therapy Session Note  Patient Details  Name: Linda Mason [facility] MRN: [id number] Date of Birth: 31-Mar-1996  Today's Date: 09/06/2023 OT Individual Time: 6578-4696 OT Individual Time Calculation (min): 56 min    Short Term Goals: Week 1:  OT Short Term Goal 1 (Week 1): Pt will complete sit > stand in prep for ADL with min A using LRAD OT Short Term Goal 2 (Week 1): Pt will complete 1/3 toileting steps with CGA for balance OT Short Term Goal 3 (Week 1): Pt will complete toilet transfer with mod A using LRAD  Skilled Therapeutic Interventions/Progress Updates:    Patient agreeable to participate in OT session. Reports no pain at start of session. Demonstrated and verbalized some right knee pain during session when standing for an extended amount of time. Monitored during session and task adjusted as needed.    Patient participated in skilled OT session focusing on standing tolerance and sit to stand transition in order to increase ability to complete toileting and LB dressing steps.   Pt utilized resistive clothespins placed on herself, OT or vertical surface to work on standing tolerance. Pt stood 3 times to remove and/or place clothespins utilizing RW for balance. Pt provided with visual demonstration and VC for hand placement during RW management and keeping RLE extended out in front of her (with KI on) while completing each sit<>stand transition. Required max verbal and tactile cues to carry over. Increased difficulty with extending RLE out d/t to grips on hospital socks. Recommended trying to fix into shoes tomorrow.  3 standing trials completed total  Pt was able to increase time standing for each trial.  Trial 1: 8 pins used. Trial 2: 14 pins used Trial 3: 16 pins used.   Pt participated in BUE strengthening task using rebounder and small yellow therapy ball seated. 3 trials completed. Each trial consisted of 10 throws. Pt able to throw/catch all tosses except one.   In  person interpreter present to translate for mother when needed.    Therapy Documentation Precautions:  Precautions Precautions: Knee Precaution/Restrictions Comments: Prader-willi syndrome Required Braces or Orthoses: Knee Immobilizer - Right Knee Immobilizer - Right: On when out of bed or walking Restrictions Weight Bearing Restrictions Per Provider Order: Yes RLE Weight Bearing Per Provider Order: Weight bearing as tolerated  Therapy/Group: Individual Therapy  [name], OTR/L,CBIS  Supplemental OT - [facility] and [facility] Secure Chat Preferred   09/06/2023, 8:45 AM

## 2023-09-06 NOTE — Progress Notes (Signed)
 Occupational Therapy Session Note  Patient Details  Name: Rosalene Wardrop MRN: [id number] Date of Birth: 15-Mar-1996 Session 1: {CHL IP REHAB OT TIME CALCULATIONS:304400400} Session 2: {CHL IP REHAB OT TIME CALCULATIONS:304400400}  Short Term Goals: Week 1:  OT Short Term Goal 1 (Week 1): Pt will complete sit > stand in prep for ADL with min A using LRAD OT Short Term Goal 2 (Week 1): Pt will complete 1/3 toileting steps with CGA for balance OT Short Term Goal 3 (Week 1): Pt will complete toilet transfer with mod A using LRAD  Skilled Therapeutic Interventions/Progress Updates:    Session 1: Patient agreeable to participate in OT session. Reports *** pain level.   Patient participated in skilled OT session focusing on ***. Therapist facilitated/assessed/developed/educated/integrated/elicited *** in order to improve/facilitate/promote   Session 2: Patient agreeable to participate in OT session. Reports *** pain level.   Patient participated in skilled OT session focusing on ***. Therapist facilitated/assessed/developed/educated/integrated/elicited *** in order to improve/facilitate/promote    Therapy Documentation Precautions:  Precautions Precautions: Knee Precaution/Restrictions Comments: Prader-willi syndrome Required Braces or Orthoses: Knee Immobilizer - Right Knee Immobilizer - Right: On when out of bed or walking Restrictions Weight Bearing Restrictions Per Provider Order: Yes RLE Weight Bearing Per Provider Order: Weight bearing as tolerated  Therapy/Group: Individual Therapy  [name], OTR/L,CBIS  Supplemental OT - [facility] and [facility] Secure Chat Preferred   09/06/2023, 5:21 PM

## 2023-09-06 NOTE — Progress Notes (Signed)
 Physical Therapy Session Note  Patient Details  Name: Linda Mason MRN: [id number] Date of Birth: [birth year]  Today's Date: 09/06/2023 PT Individual Time: 0900-1011 PT Individual Time Calculation (min): 71 min   Short Term Goals: Week 1:  PT Short Term Goal 1 (Week 1): Pt will complete bed mobility with CGA PT Short Term Goal 2 (Week 1): Pt will complete transfer with LRAD with min assist PT Short Term Goal 3 (Week 1): Pt will ambulate 20' with min assist and 2nd person WC follow  Skilled Therapeutic Interventions/Progress Updates:      Direct handoff of care from RN to start with patient sitting in wheelchair. Patient requesting to don pants before leaving her room. Donned with maxA and KI applied over pants. Sit<>stand to RW with minA from w/c height with cues for hand placement. Family in room to assist with pulling pants over hips in standing with RW support. Transported to main rehab gym for time.   Completed stand pivot transfer with minA and RW from w/c to mat table - cues for sequencing and ensuring legs touch surface before sitting.   2x5 sit<>stands from mat table to RW with CGA - TC for upright posture for hip and trunk extension, encouraged forward gaze as well.   Progressed to gait training 2x47ft with minA and RW with +2 assist for w/c follow for safety. Pt grabbing the front of RW frame with her L hand and needing cues for hand placement, postural awareness, and increasing stride length. Gait distance limited by fatigue.   Switched out 22x18 wheelchair for a hemi-height 20x16 wheelchair to improve floor-to-height distance to help with forward/backward scooting and repositioning in wheelchair.   Updated safety plan to change from Stedy +2 to stand pivot RW +2 - RN made aware via secure chat.   Seated there-ex with 2# dumbbell: -3x10 shoulder press bilaterally -3x10 arm punches bilaterally -3x10 bicep curls bilaterally  Pt completed wheelchair mobility 14ft with  x2 turns with supervision assist - min cues for increasing shoulder extension bilaterally for improving stroke propulsion and efficiency.   Pt returned to her room at end of treatment. Left sitting upright with her family present. All her needs met.   Therapy Documentation Precautions:  Precautions Precautions: Knee Precaution/Restrictions Comments: Prader-willi syndrome Required Braces or Orthoses: Knee Immobilizer - Right Knee Immobilizer - Right: On when out of bed or walking Restrictions Weight Bearing Restrictions Per Provider Order: Yes RLE Weight Bearing Per Provider Order: Weight bearing as tolerated General:    Therapy/Group: Individual Therapy  Orrin Brigham 09/06/2023, 7:58 AM

## 2023-09-06 NOTE — Progress Notes (Signed)
 PROGRESS NOTE   Subjective/Complaints:  Blood sugars remain elevated 200-300s. Vital stable Patient doing well overall, says her leg is hurting less, still having trouble sleeping at night. Per mom, pain medication at night helps.   ROS: Denies fevers, chills, N/V, abdominal pain, constipation, diarrhea, SOB, cough, chest pain, new weakness or paraesthesias.    + LLE pain--ongoing, slightly improved + poor sleep--ongoing  Objective:   No results found. Recent Labs    09/04/23 1119  WBC 5.6  HGB 12.1  HCT 36.9  PLT 73*   Recent Labs    09/03/23 1526 09/04/23 1119  NA  --  132*  K 5.1 4.6  CL  --  94*  CO2  --  27  GLUCOSE  --  300*  BUN  --  21*  CREATININE  --  0.47  CALCIUM  --  9.0    Intake/Output Summary (Last 24 hours) at 09/06/2023 0800 Last data filed at 09/05/2023 1800 Gross per 24 hour  Intake 327 ml  Output --  Net 327 ml        Physical Exam: Vital Signs Blood pressure (!) 132/90, pulse 81, temperature 97.6 F (36.4 C), temperature source Oral, resp. rate 18, weight 107.2 kg, SpO2 99%. Constitutional: No apparent distress. Appropriate appearance for age.  Morbidly obese.  Sitting up in bedside chair. HENT: No JVD. Neck Supple. Trachea midline. Atraumatic, normocephalic. Eyes: PERRLA. EOMI. Visual fields grossly intact.  Cardiovascular: RRR, no murmurs/rub/gallops.  1+ bilateral lower extremity edema. Peripheral pulses 2+  Respiratory: CTAB. No rales, rhonchi, or wheezing.  On room air. Abdomen: + bowel sounds, normoactive. No distention or tenderness.  Skin: C/D/I. -No further apparent rash on face  MSK:     Apparent deformities.  Right lower extremity extension brace donned  Neurologic exam:  Cognition: AAO to person, place, time and event.  Follows all simple commands. + Baseline higher-level cognitive deficits in insight and attention  Language: Fluent, No substitutions or  neoglisms. No dysarthria. Names 3/3 objects correctly.  Memory: No apparent deficits  Insight: Poor insight into current condition.  Mood: Pleasant affect, appropriate mood.  Sensation, reflexes, cranial nerves, coordination, and tone without apparent deficits.        Strength:                RUE: 5/5 SA, 5/5 EF, 5/5 EE, 5/5 WE, 5/5 FF, 5/5 FA                 LUE: 5/5 SA, 5/5 EF, 5/5 EE, 5/5 WE, 5/5 FF, 5/5 FA                 RLE: Limited due to extension brace; 1/5 HF, 2/5 DF, 2/5 PF due to pain                LLE:  2/5 HF, 4/5 KE, 5/5 DF, 5/5 EHL, 5/5 PF --limited by pain, stable 3/3     Assessment/Plan: 1. Functional deficits which require 3+ hours per day of interdisciplinary therapy in a comprehensive inpatient rehab setting. Physiatrist is providing close team supervision and 24 hour management of active medical problems listed below. Physiatrist and rehab  team continue to assess barriers to discharge/monitor patient progress toward functional and medical goals  Care Tool:  Bathing    Body parts bathed by patient: Right arm, Left arm, Chest, Abdomen   Body parts bathed by helper: Front perineal area, Buttocks, Right upper leg, Left upper leg, Right lower leg, Left lower leg Body parts n/a: Right upper leg, Right lower leg   Bathing assist Assist Level: Maximal Assistance - Patient 24 - 49%     Upper Body Dressing/Undressing Upper body dressing   What is the patient wearing?: Pull over shirt    Upper body assist Assist Level: Minimal Assistance - Patient > 75%    Lower Body Dressing/Undressing Lower body dressing      What is the patient wearing?: Incontinence brief     Lower body assist Assist for lower body dressing: 2 Helpers     Toileting Toileting    Toileting assist Assist for toileting: 2 Helpers     Transfers Chair/bed transfer  Transfers assist     Chair/bed transfer assist level: Minimal Assistance - Patient > 75%      Locomotion Ambulation   Ambulation assist   Ambulation activity did not occur: Safety/medical concerns          Walk 10 feet activity   Assist  Walk 10 feet activity did not occur: Safety/medical concerns        Walk 50 feet activity   Assist Walk 50 feet with 2 turns activity did not occur: Safety/medical concerns         Walk 150 feet activity   Assist Walk 150 feet activity did not occur: Safety/medical concerns         Walk 10 feet on uneven surface  activity   Assist Walk 10 feet on uneven surfaces activity did not occur: Safety/medical concerns         Wheelchair     Assist Is the patient using a wheelchair?: Yes Type of Wheelchair: Manual    Wheelchair assist level: Dependent - Patient 0%      Wheelchair 50 feet with 2 turns activity    Assist        Assist Level: Dependent - Patient 0%   Wheelchair 150 feet activity     Assist      Assist Level: Dependent - Patient 0%   Blood pressure (!) 132/90, pulse 81, temperature 97.6 F (36.4 C), temperature source Oral, resp. rate 18, weight 107.2 kg, SpO2 99%.  Medical Problem List and Plan: 1. Functional deficits secondary to right knee patellar dislocation.  Status post right knee arthroscopic assisted patellar reduction with lateral release.  Right knee arthroscopic loose body removal right knee open management of lateral femoral condyle and medial patella fracture 08/27/2023.  Weightbearing as tolerated.  Knee immobilizer when out of bed or when ambulating.             -patient may shower             -ELOS/Goals: 10-14 days MinA/S              -stable to continue CIR   2.  Antithrombotics: -DVT/anticoagulation:  Mechanical: Antiembolism stockings, thigh (TED hose) Bilateral lower extremities.  Venous Dopplers negative             -antiplatelet therapy: continue Aspirin 325 mg daily x 2 weeks   3. Pain Management: continue Neurontin 100 mg 3 times daily Robaxin 500 mg  3 times daily, Naprosyn 500 mg twice daily, oxycodone  as needed  -2-27: Poor pain control interrupting sleep and very limiting for therapy progress.  Increase regimen to scheduled Tylenol 1000 mg 3 times daily, Robaxin 750 mg 3 times daily, and scheduled oxycodone 5 mg 3 times daily in addition to 5 mg every 4 hours as needed.  2-28: Improvement in pain control with the above measures, does well with premedicating therapies with PRNs.  Increase nightly gabapentin to 300 mg, can consider switching scheduled oxycodone to long-acting OxyContin 10 mg twice daily over the weekend if no improvement (discussed this with mother/therapies, patient will endorse severe pain regardless)  3/3: Using PRN oxycodone overnight consistently   4. Mood/Behavior/Sleep: Provide emotional support             -antipsychotic agents: N/A  -2-27: Poor sleep due to right leg pain.  Pain control as above, add as needed melatonin  3-3: Sleep remains intermittently interrupted, able to go back to sleep with pain medication.   5. Neuropsych/cognition: This patient is not capable of making decisions on her own behalf.   6. Skin/Wound Care/RASH new 2/28: Routine skin checks  -2-28: Nursing checking with mom regarding any prior medication reactions.  No recently started medications (noticed before AM lasix).  Continue as needed Benadryl.  3/3: Rash appears resolved   7. Fluids/Electrolytes/Nutrition: Routine in and outs with follow-up chemistries    - K 5.9, Na 132, BUN 62 - IVF at 100 cc/hr 2/26  - 2-28: BUN/creatinine improved with IV fluids, remains hyperglycemic and with potassium up to 6.0; increase insulin as below, continue IV fluids, give Lasix 40 mg IV today and retest potassium this afternoon.   -- Responsive to Lasix, repeat 5.1. Stop IVF.  Labs in AM ordered - 3/1: K+ reviewed and normalized   - 3/3: BMP with decreased sodium 129; has been slightly low around 132.  Otherwise, stable.  8.  Prader-Willi syndrome.   Patient lives with parents independent prior to admission   -Will get behavioral plan in place to assist with staff understanding patient's cognitive baseline and motivation; per mom, does well with food motivators and puzzles/coloring books.  Would use food sparingly given uncontrolled diabetes.  9.  Diabetes mellitus.  Hemoglobin A1c 9.5.  continue NovoLog 6 units 3 times daily, Semglee 55 units nightly.  Glycemia likely complicated by poor pain control   - home regimen per chart review endocrinology 06/22/23: Basal insulin: Continue Lantus to 50 units daily Prandial insulin: Continue Novolog 12 units with meals Correctional insulin: Continue Novolog 2: 50 > 150 with meals and the half dose before bed Non-insulin agents: - tirzepatide 5 mg weekly, increase as able (is currently finishing out their last ozempic pen before switching) - Continue metformin ER 1000 mg bid - Continue empagliflozin 25 mg daily    - Per mom, baseline blood sugar between 150 and 300    - Was switched to semglee 32 U BID yesterday; IV fluids today   2/28: Resume home metformin, hold Jardiance due to hyperkalemia, increase Lantus to 45 units twice daily  Regimen adjusted as per diabetic coordinator recommendations-- 13U premeal + SSI + 50U lantis BID  3/3: BG remains elevated; increase lantus to 60 U BID, premeal to 15 U TID--improved this afternoon.  If potassium remains stable and sodium improved in a.m. labs, will resume Jardiance.  Recent Labs    09/06/23 1206 09/06/23 1625 09/06/23 2117  GLUCAP 176* 208* 260*     10.  Hypertension.  Continue Lisinopril 40 mg daily, Toprol-XL  25 mg daily    - normotensive    09/06/2023    5:15 AM 09/05/2023    7:53 PM 09/05/2023    1:30 PM  Vitals with BMI  Systolic 132 101 161  Diastolic 90 65 69  Pulse 81 96 96    11.  Morbid obesity.  BMI 44.88.  Dietary follow-up.  Complicated by significant food motivation due to Prader-Willi syndrome.    LOS: 5 days A FACE TO  FACE EVALUATION WAS PERFORMED  Angelina Sheriff 09/06/2023, 8:00 AM

## 2023-09-07 DIAGNOSIS — S83004A Unspecified dislocation of right patella, initial encounter: Secondary | ICD-10-CM | POA: Diagnosis not present

## 2023-09-07 LAB — BASIC METABOLIC PANEL
Anion gap: 7 (ref 5–15)
BUN: 18 mg/dL (ref 6–20)
CO2: 29 mmol/L (ref 22–32)
Calcium: 9 mg/dL (ref 8.9–10.3)
Chloride: 96 mmol/L — ABNORMAL LOW (ref 98–111)
Creatinine, Ser: 0.52 mg/dL (ref 0.44–1.00)
GFR, Estimated: >60 mL/min (ref >60)
Glucose, Bld: 280 mg/dL — ABNORMAL HIGH (ref 70–99)
Potassium: 4.7 mmol/L (ref 3.5–5.1)
Sodium: 132 mmol/L — ABNORMAL LOW (ref 135–145)

## 2023-09-07 LAB — GLUCOSE, CAPILLARY
Glucose-Capillary: 265 mg/dL — ABNORMAL HIGH (ref 70–99)
Glucose-Capillary: 276 mg/dL — ABNORMAL HIGH (ref 70–99)
Glucose-Capillary: 210 mg/dL — ABNORMAL HIGH (ref 70–99)
Glucose-Capillary: 165 mg/dL — ABNORMAL HIGH (ref 70–99)

## 2023-09-07 MED ORDER — EMPAGLIFLOZIN 25 MG PO TABS
25.0000 mg | ORAL_TABLET | Freq: Every day | ORAL | Status: DC
  Administered 2023-09-07 – 2023-09-16 (×10): 25 mg via ORAL
  Filled 2023-09-07 (×10): qty 1

## 2023-09-07 NOTE — Progress Notes (Signed)
 Physical Therapy Session Note  Patient Details  Name: Linda Mason MRN: [id number] Date of Birth: 1996-06-08  Today's Date: 09/07/2023 PT Individual Time: 1102-1200 + 4098-1191 PT Individual Time Calculation (min): 58 min  + 54 min  Short Term Goals: Week 1:  PT Short Term Goal 1 (Week 1): Pt will complete bed mobility with CGA PT Short Term Goal 2 (Week 1): Pt will complete transfer with LRAD with min assist PT Short Term Goal 3 (Week 1): Pt will ambulate 20' with min assist and 2nd person WC follow  Skilled Therapeutic Interventions/Progress Updates:    SESSION 1: Pt presents in room in [facility] with mother and interpreter present. Pt mother states pt will need RW and WC at DC, states they will be building a ramp prior to DC for entry to house, and states she'll be present 24/7 at DC. Session focused on gait training and WC mobility. Pt completes transfers to RW with CGA throughout session to RW  Pt self propels WC ~100' from room to main gym with supervision demonstrating good negotiation of doorways and obstacles with min cues.  Pt ambulates 2x5' then 2x10' to cone placed as external cue with CGA and therapist pulling WC in tow for pt fatigue. Pt places LUE on front bar of RW rather than RW handle however does not correct despite cues.  Pt provided with seated rest breaks between all gait trials to promote energy conservation and quality with tasks as well as promote improved participation with tasks with pt provided with music during rest breaks. MD present during rest break and assess pt concerns for rash under R knee brace, with MD encouraging return to bed to take brace off to decrease irritation.  Pt self propels WC back to room with supervision, therapist prompts pt to complete transfer back to bed. Pt refusing transfer back to bed and begins crying with increased time to allow pt to return to baseline emotional status. Pt remains seated in WC with all needs within reach, cal light  in place and pt mother in room at end of session.   SESSION 2: Pt presents seated behind nursing station, agreeable to PT. Pt denies pain at this time. Session focused on WC mobility, therapeutic activities standing tolerance and dynamic standing balance, transfer training, and participation with self care tasks, and gait training.  Pt self propels WC to main gym with distant supervision. Pt begins to complete gait training but report difficulty ambulating due to pain in hands. Therapist wraps washrag with coban around RW handles to promote improved comfort and RW management. Pt completes stand to RW with CGA and begins ambulating with LUE on front bar of RW despite adjustment to RW for comfort but does demonstrating increased gait speed and improved tolerance to upright, ambulates 15' with RW CGA pulling WC in tow.  Pt takes extended seated rest break, then self propels with BUEs to day room. Pt participates with standing activity bowling to knock over pins, completed to promote improved participation with task, dynamic standing balance, and standing tolerance with pt using RUE on RW, LUE to throw bowling ball. Pt maintains standing 1-2 minutes x3 trials, takes seated rest break between trials.  Pt begins to self propel back to room however demonstrating fatigue, therapist assist for transport back to room for energy conservation and time management. Pt requests to use restroom, completes stand step transfer with min assist and RW, cues for positioning. Pt requires max assist for clothing management and periarea hygiene,  pt continent of bowel and bladder, charted. Pt completes stand step transfer back to [facility] and positioned in room where she remains seated in [employer] with all needs within reach, call light in place at end of session.  Therapy Documentation Precautions:  Precautions Precautions: Knee Precaution/Restrictions Comments: Prader-willi syndrome Required Braces or Orthoses: Knee Immobilizer -  Right Knee Immobilizer - Right: On when out of bed or walking Restrictions Weight Bearing Restrictions Per Provider Order: Yes RLE Weight Bearing Per Provider Order: Weight bearing as tolerated    Therapy/Group: Individual Therapy  Edwin Cap PT, DPT 09/07/2023, 4:44 PM

## 2023-09-07 NOTE — Patient Care Conference (Signed)
 Inpatient RehabilitationTeam Conference and Plan of Care Update Date: 09/07/2023   Time: 1056    Patient Name: Linda Mason [facility]      Medical Record Number: [id number]  Date of Birth: Feb 12, 1996 Sex: Female         Room/Bed: 4M13C/4M13C-01 Payor Info: Payor: [employer] / Plan: [employer] / Product Type: *No Product type* /    Admit Date/Time:  09/01/2023  2:51 PM  Primary Diagnosis:  Closed patellar dislocation, right, initial encounter  Hospital Problems: Principal Problem:   Closed patellar dislocation, right, initial encounter    Expected Discharge Date: Expected Discharge Date: 09/16/23  Team Members Present: Physician leading conference: Dr. Elijah Birk Social Worker Present: Cecile Sheerer, LCSWA Nurse Present: Konrad Dolores, RN PT Present: Darrold Span, PT OT Present: [name], OT SLP Present: Other (comment) Alvera Novel SLP) PPS Coordinator present : Fae Pippin, SLP     Current Status/Progress Goal Weekly Team Focus  Bowel/Bladder   Continent of bowels; incontinent of bladder  remain continent   Assist with toileting qshift and prn    Swallow/Nutrition/ Hydration               ADL's   max A-total LB dressing, max A bathing, Set-up grooming, Min A sit<>stand with RW and step pivot t/f,   Min A   Increase standing tolerance time, sit<>stand transitions using RW, step pivot transfers, LB ADL participation.    Mobility   minA bed mobility, stand pivot transfers with RW, and gait 10'   supervision  barriers: behavior, pain; focus on gait training, weightbearing tolerance, tolerance to upright    Communication                Safety/Cognition/ Behavioral Observations               Pain   pt c/o R knee and leg pain 9/10 pain score. Scheduled and prn meds given   <3 pain score   Assess qshift and prn    Skin   Skin intact, Generalized rash, sutures to the R knee   Maintain skin integrity   Assess qshift and prn      Discharge Planning:  Pt will discharge to home with her mother providing 24/7 care. Mother would like for her daughter to be as independent as possible. Pt only eligible for outpatient therapies as no [facility] is in network with insurance. SW will confirm there are no barriers to discharge.   Team Discussion: Patient admitted post fall with right knee patellar dislocation.Patient was status post right knee arthroscopic assisted patellar reduction.  Patient has uncontrolled blood sugars, electrolyte imbalances: medications adjusted by MD.  Patient limited by pain, poor sleep due to right leg pain and  behavioral issues.   Patient on target to meet rehab goals: yes, Currently, patient requires max A-total for Upper body care and Lower body care. Patient requires max A with  bathing. Patient requires Min A sit<>stand using a  RW. Patient requires minA with  stand pivot transfers. Patient able to  ambulate  10' using a RW. Overall goals for discharge set at minimal assistance -supervision.   *See Care Plan and progress notes for long and short-term goals.   Revisions to Treatment Plan:  Behavior plan   Teaching Needs: Safety, dietary modification, toileting, transfers, medications, etc.   Current Barriers to Discharge: Decreased caregiver support, Weight, and Behavior  Possible Resolutions to Barriers: Family education Outpatient follow-up DME: W/C , RW  Medical Summary Current Status: medically complicated by behavioral difficulties, poor pain control, hyperphagia with uncontrolled BG, intermitteny hyperkalemia, hyponatremia, rash and sleep disruption  Barriers to Discharge: Behavior/Mood;Electrolyte abnormality;Medical stability;Morbid Obesity;Self-care education;Uncontrolled Diabetes;Uncontrolled Pain;Weight bearing restrictions   Possible Resolutions to [employer]: titrate pain medication to minimal tolerable dosing, titrating and resuming home  diabetes medications, monitorring of labs for hyperkalemia/hyponatremia/dehydration, and monitorring of sleep pattern   Continued Need for Acute Rehabilitation Level of Care: The patient requires daily medical management by a physician with specialized training in physical medicine and rehabilitation for the following reasons: Direction of a multidisciplinary physical rehabilitation program to maximize functional independence : Yes Medical management of patient stability for increased activity during participation in an intensive rehabilitation regime.: Yes Analysis of laboratory values and/or radiology reports with any subsequent need for medication adjustment and/or medical intervention. : Yes   I attest that I was present, lead the team conference, and concur with the assessment and plan of the team.   Gwenyth Allegra 09/07/2023, 6:45 PM

## 2023-09-07 NOTE — Progress Notes (Signed)
 PROGRESS NOTE   Subjective/Complaints:  Patient complaining of itching in leg extension brace; otherwise doing well. Walked a little with PT today.    ROS: Denies fevers, chills, N/V, abdominal pain, constipation, diarrhea, SOB, cough, chest pain, new weakness or paraesthesias.    + LLE pain--ongoing, slightly improved + poor sleep--improved + R leg itching  Objective:   No results found. Recent Labs    09/04/23 1119 09/06/23 1219  WBC 5.6 6.2  HGB 12.1 12.7  HCT 36.9 38.9  PLT 73* 65*   Recent Labs    09/06/23 1219 09/07/23 0529  NA 129* 132*  K 4.4 4.7  CL 92* 96*  CO2 27 29  GLUCOSE 171* 280*  BUN 19 18  CREATININE 0.49 0.52  CALCIUM 9.0 9.0    Intake/Output Summary (Last 24 hours) at 09/07/2023 1056 Last data filed at 09/07/2023 0700 Gross per 24 hour  Intake 348 ml  Output --  Net 348 ml        Physical Exam: Vital Signs Blood pressure 132/87, pulse 85, temperature (!) 97.4 F (36.3 C), resp. rate 18, weight 107.2 kg, SpO2 97%.  Constitutional: No apparent distress. Appropriate appearance for age.  Morbidly obese. Sitting in therapy gym.  HENT: No JVD. Neck Supple. Trachea midline. Atraumatic, normocephalic. Eyes: PERRLA. EOMI. Visual fields grossly intact.  Cardiovascular: RRR, no murmurs/rub/gallops.  1+ bilateral lower extremity edema. Peripheral pulses 2+  Respiratory: CTAB. No rales, rhonchi, or wheezing.  On room air. Abdomen: + bowel sounds, normoactive. No distention or tenderness.  Skin: C/D/I. Mild swelling and erythema superior to medial patella; no obvious urticaria or rash   MSK:     Apparent deformities.  Right lower extremity extension brace donned  Neurologic exam:  Cognition: AAO to person, place, time and event.  Follows all simple commands. + Baseline higher-level cognitive deficits in insight and attention  Language: Fluent, No substitutions or neoglisms. No dysarthria.  Names 3/3 objects correctly.  Memory: No apparent deficits  Insight: Poor insight into current condition.  Mood: Pleasant affect, appropriate mood.  Sensation, reflexes, cranial nerves, coordination, and tone without apparent deficits.        Strength:                RUE: 5/5 SA, 5/5 EF, 5/5 EE, 5/5 WE, 5/5 FF, 5/5 FA                 LUE: 5/5 SA, 5/5 EF, 5/5 EE, 5/5 WE, 5/5 FF, 5/5 FA                 RLE: Limited due to extension brace; 1/5 HF, 2/5 DF, 2/5 PF due to pain                LLE:  4/5 HF, 4/5 KE, 5/5 DF, 5/5 EHL, 5/5 PF   Unchanged 3/4     Assessment/Plan: 1. Functional deficits which require 3+ hours per day of interdisciplinary therapy in a comprehensive inpatient rehab setting. Physiatrist is providing close team supervision and 24 hour management of active medical problems listed below. Physiatrist and rehab team continue to assess barriers to discharge/monitor patient progress toward functional and  medical goals  Care Tool:  Bathing    Body parts bathed by patient: Right arm, Left arm, Chest, Abdomen   Body parts bathed by helper: Front perineal area, Buttocks, Right upper leg, Left upper leg, Right lower leg, Left lower leg Body parts n/a: Right upper leg, Right lower leg   Bathing assist Assist Level: Maximal Assistance - Patient 24 - 49%     Upper Body Dressing/Undressing Upper body dressing   What is the patient wearing?: Pull over shirt    Upper body assist Assist Level: Minimal Assistance - Patient > 75%    Lower Body Dressing/Undressing Lower body dressing      What is the patient wearing?: Incontinence brief     Lower body assist Assist for lower body dressing: 2 Helpers     Toileting Toileting    Toileting assist Assist for toileting: 2 Helpers     Transfers Chair/bed transfer  Transfers assist     Chair/bed transfer assist level: Minimal Assistance - Patient > 75%     Locomotion Ambulation   Ambulation assist   Ambulation  activity did not occur: Safety/medical concerns  Assist level: 2 helpers (w/c follow) Assistive device: Walker-rolling Max distance: 12ft   Walk 10 feet activity   Assist  Walk 10 feet activity did not occur: Safety/medical concerns  Assist level: 2 helpers Assistive device: Walker-rolling   Walk 50 feet activity   Assist Walk 50 feet with 2 turns activity did not occur: Safety/medical concerns         Walk 150 feet activity   Assist Walk 150 feet activity did not occur: Safety/medical concerns         Walk 10 feet on uneven surface  activity   Assist Walk 10 feet on uneven surfaces activity did not occur: Safety/medical concerns         Wheelchair     Assist Is the patient using a wheelchair?: Yes Type of Wheelchair: Manual    Wheelchair assist level: Supervision/Verbal cueing Max wheelchair distance: 125'    Wheelchair 50 feet with 2 turns activity    Assist        Assist Level: Supervision/Verbal cueing   Wheelchair 150 feet activity     Assist      Assist Level: Dependent - Patient 0%   Blood pressure 132/87, pulse 85, temperature (!) 97.4 F (36.3 C), resp. rate 18, weight 107.2 kg, SpO2 97%.  Medical Problem List and Plan: 1. Functional deficits secondary to right knee patellar dislocation.  Status post right knee arthroscopic assisted patellar reduction with lateral release.  Right knee arthroscopic loose body removal right knee open management of lateral femoral condyle and medial patella fracture 08/27/2023.  Weightbearing as tolerated.  Knee immobilizer when out of bed or when ambulating.             -patient may shower             -ELOS/Goals: 10-14 days MinA/S- 09/15/33              -stable to continue CIR   - 3/3: Will need WC and walker for home; OP therapies only. Making progress but limited by poor pain tolerance.    2.  Antithrombotics: -DVT/anticoagulation:  Mechanical: Antiembolism stockings, thigh (TED hose)  Bilateral lower extremities.  Venous Dopplers negative             -antiplatelet therapy: continue Aspirin 325 mg daily x 2 weeks   3. Pain Management: continue Neurontin 100  mg 3 times daily Robaxin 500 mg 3 times daily, Naprosyn 500 mg twice daily, oxycodone as needed  -2-27: Poor pain control interrupting sleep and very limiting for therapy progress.  Increase regimen to scheduled Tylenol 1000 mg 3 times daily, Robaxin 750 mg 3 times daily, and scheduled oxycodone 5 mg 3 times daily in addition to 5 mg every 4 hours as needed.  2-28: Improvement in pain control with the above measures, does well with premedicating therapies with PRNs.  Increase nightly gabapentin to 300 mg, can consider switching scheduled oxycodone to long-acting OxyContin 10 mg twice daily over the weekend if no improvement (discussed this with mother/therapies, patient will endorse severe pain regardless)  3/3: Using PRN oxycodone overnight consistently   4. Mood/Behavior/Sleep: Provide emotional support             -antipsychotic agents: N/A  -2-27: Poor sleep due to right leg pain.  Pain control as above, add as needed melatonin  3-3: Sleep remains intermittently interrupted, able to go back to sleep with pain medication.--improving   5. Neuropsych/cognition: This patient is not capable of making decisions on her own behalf.   6. Skin/Wound Care/RASH new 2/28: Routine skin checks  -2-28: Nursing checking with mom regarding any prior medication reactions.  No recently started medications (noticed before AM lasix).  Continue as needed Benadryl.  3/3: Rash appears resolved  3/4: Itching from moisture in extension brace; remove when in bed   7. Fluids/Electrolytes/Nutrition: Routine in and outs with follow-up chemistries    - K 5.9, Na 132, BUN 62 - IVF at 100 cc/hr 2/26  - 2-28: BUN/creatinine improved with IV fluids, remains hyperglycemic and with potassium up to 6.0; increase insulin as below, continue IV fluids, give  Lasix 40 mg IV today and retest potassium this afternoon.   -- Responsive to Lasix, repeat 5.1. Stop IVF.  Labs in AM ordered - 3/1: K+ reviewed and normalized   - 3/3: BMP with decreased sodium 129; has been slightly low around 132.  Otherwise, stable. 3/4: Na improved, K stable  8.  Prader-Willi syndrome.  Patient lives with parents independent prior to admission   -Will get behavioral plan in place to assist with staff understanding patient's cognitive baseline and motivation; per mom, does well with food motivators and puzzles/coloring books.  Would use food sparingly given uncontrolled diabetes.  9.  Diabetes mellitus.  Hemoglobin A1c 9.5.  continue NovoLog 6 units 3 times daily, Semglee 55 units nightly.  Glycemia likely complicated by poor pain control   - home regimen per chart review endocrinology 06/22/23: Basal insulin: Continue Lantus to 50 units daily Prandial insulin: Continue Novolog 12 units with meals Correctional insulin: Continue Novolog 2: 50 > 150 with meals and the half dose before bed Non-insulin agents: - tirzepatide 5 mg weekly, increase as able (is currently finishing out their last ozempic pen before switching) - Continue metformin ER 1000 mg bid - Continue empagliflozin 25 mg daily    - Per mom, baseline blood sugar between 150 and 300    - Was switched to semglee 32 U BID yesterday; IV fluids today   2/28: Resume home metformin, hold Jardiance due to hyperkalemia, increase Lantus to 45 units twice daily  Regimen adjusted as per diabetic coordinator recommendations-- 13U premeal + SSI + 50U lantis BID  3/3: BG remains elevated; increase lantus to 60 U BID, premeal to 15 U TID--improved this afternoon.  If potassium remains stable and sodium improved in a.m. labs, will  resume Jardiance-- 3/4: K stable, ordered Jardiance 25 mg daily  Recent Labs    09/06/23 1625 09/06/23 2117 09/07/23 0621  GLUCAP 208* 260* 265*     10.  Hypertension.  Continue Lisinopril  40 mg daily, Toprol-XL 25 mg daily    - normotensive    09/07/2023    6:01 AM 09/06/2023    1:17 PM 09/06/2023    5:15 AM  Vitals with BMI  Systolic 132 118 562  Diastolic 87 73 90  Pulse 85 89 81    11.  Morbid obesity.  BMI 44.88.  Dietary follow-up.  Complicated by significant food motivation due to Prader-Willi syndrome.    LOS: 6 days A FACE TO FACE EVALUATION WAS PERFORMED  Angelina Sheriff 09/07/2023, 10:56 AM

## 2023-09-07 NOTE — Progress Notes (Signed)
 Patient ID: Braelee Herrle [facility], female   DOB: [birth year], 28 y.o.   MRN: [id number]  SW went by pt room to provide updates but pt mother not present. SW will continue to follow-up to provide updates.   Cecile Sheerer, MSW, LCSW Office: [phone] Cell: [phone] Fax: [phone]

## 2023-09-08 DIAGNOSIS — S83004A Unspecified dislocation of right patella, initial encounter: Secondary | ICD-10-CM | POA: Diagnosis not present

## 2023-09-08 LAB — GLUCOSE, CAPILLARY
Glucose-Capillary: 142 mg/dL — ABNORMAL HIGH (ref 70–99)
Glucose-Capillary: 101 mg/dL — ABNORMAL HIGH (ref 70–99)
Glucose-Capillary: 126 mg/dL — ABNORMAL HIGH (ref 70–99)
Glucose-Capillary: 160 mg/dL — ABNORMAL HIGH (ref 70–99)

## 2023-09-08 MED ORDER — INSULIN GLARGINE 100 UNIT/ML ~~LOC~~ SOLN
30.0000 [IU] | Freq: Two times a day (BID) | SUBCUTANEOUS | Status: DC
  Administered 2023-09-08 – 2023-09-10 (×4): 30 [IU] via SUBCUTANEOUS
  Filled 2023-09-08 (×6): qty 0.3

## 2023-09-08 MED ORDER — INSULIN ASPART 100 UNIT/ML IJ SOLN
12.0000 [IU] | Freq: Three times a day (TID) | INTRAMUSCULAR | Status: DC
  Administered 2023-09-08 – 2023-09-16 (×21): 12 [IU] via SUBCUTANEOUS

## 2023-09-08 NOTE — Progress Notes (Signed)
 Pt ambulated to bathroom, knee immobilizer noted to be in incorrect position.Marland Kitchen discussed with pt the need to adjust to ensure proper alignment to prevent injury. Knee immobilizer adjusted to correct position, pt crying saying "thats not right thats not right" second nurse verified placement. Pt ambulated back to bed. Knee immobilizer removed as ordered. Ace wrap also incorrectly wrapped causing edema. Ace wrap removed, site cleansed and foams changed. Leg elevated and ice pack on. Pt still crying at this time. Offered emotional support and mother at bedside. Mother requested x4 bedrails, bedrails placed as requested. Call light in reach, bed alarm on and in low position, bedside table in reach.   Mylo Red, LPN

## 2023-09-08 NOTE — Progress Notes (Signed)
 Physical Therapy Session Note  Patient Details  Name: Linda Mason MRN: [id number] Date of Birth: 1996/05/04  Today's Date: 09/08/2023 PT Individual Time: 0805-0900 PT Individual Time Calculation (min): 55 min   Short Term Goals: Week 1:  PT Short Term Goal 1 (Week 1): Pt will complete bed mobility with CGA PT Short Term Goal 2 (Week 1): Pt will complete transfer with LRAD with min assist PT Short Term Goal 3 (Week 1): Pt will ambulate 20' with min assist and 2nd person WC follow  Skilled Therapeutic Interventions/Progress Updates: Pt presented in w/c with mom present agreeable to therapy. Pt denies pain initially but expresses 10/10 pain a few min later, pain meds received during session. Pt noted to have KI placed below knee therefore PTA adjusted to allow for improved stabilization of knee. Pt propelled ~181ft to main gym with supervision. After seated rest pt ambulated ~19ft x 2 with CGA overall with seated rest breaks between bouts. Pt then ambulated x10ft in same manner as prior. Pt noted to ambulate with heavy forward flexed posture and stood with B hands on RW which required some increased effort to stand but was CGA overall.  Participated in some seated level UE activities for general conditioning including ball taps with 2lb dowel 2 x 15, AROM/dorsal extension with small yellow physioball x 10 (incorporating dorsal extension with small range lateral leans. Pt also performed Sit to stand x 5 with CGA overall, tactile cues to improve posture and forward head gaze. Pt propelled back to room in same manner as prior and remained in w/c at end of session with belt alarm on, call bell within reach and needs met.      Therapy Documentation Precautions:  Precautions Precautions: Knee Precaution/Restrictions Comments: Prader-willi syndrome Required Braces or Orthoses: Knee Immobilizer - Right Knee Immobilizer - Right: On when out of bed or walking Restrictions Weight Bearing  Restrictions Per Provider Order: Yes RLE Weight Bearing Per Provider Order: Weight bearing as tolerated General:   Vital Signs: Therapy Vitals Temp: 98.4 F (36.9 C) Pulse Rate: 87 Resp: 17 BP: 118/68 Patient Position (if appropriate): Sitting Oxygen Therapy SpO2: 97 % O2 Device: Room Air   Therapy/Group: Individual Therapy  Kayzlee Wirtanen 09/08/2023, 3:53 PM

## 2023-09-08 NOTE — Progress Notes (Signed)
 PROGRESS NOTE   Subjective/Complaints:  BG much improved this AM 142 Patient with minimal complaints, stating that her leg feels little bit better.  Asking for milkshake.  ROS: Denies fevers, chills, N/V, abdominal pain, constipation, diarrhea, SOB, cough, chest pain, new weakness or paraesthesias.    + LLE pain--improved + poor sleep--improved + R leg itching--improved  Objective:   No results found. Recent Labs    09/06/23 1219  WBC 6.2  HGB 12.7  HCT 38.9  PLT 65*   Recent Labs    09/06/23 1219 09/07/23 0529  NA 129* 132*  K 4.4 4.7  CL 92* 96*  CO2 27 29  GLUCOSE 171* 280*  BUN 19 18  CREATININE 0.49 0.52  CALCIUM 9.0 9.0    Intake/Output Summary (Last 24 hours) at 09/08/2023 0827 Last data filed at 09/08/2023 0804 Gross per 24 hour  Intake 946 ml  Output --  Net 946 ml        Physical Exam: Vital Signs Blood pressure 130/70, pulse 94, temperature 97.8 F (36.6 C), temperature source Oral, resp. rate 18, weight 107.2 kg, SpO2 94%.  Constitutional: No apparent distress. Appropriate appearance for age.  Morbidly obese.  Sitting up in bedside chair.  HENT: No JVD. Neck Supple. Trachea midline. Atraumatic, normocephalic. Eyes: PERRLA. EOMI. Visual fields grossly intact.  Cardiovascular: RRR, no murmurs/rub/gallops.  1+ bilateral lower extremity edema. Peripheral pulses 2+  Respiratory: CTAB. No rales, rhonchi, or wheezing.  On room air. Abdomen: + bowel sounds, normoactive. No distention or tenderness.  Skin: C/D/I. Mild swelling and erythema superior to medial patella; no obvious urticaria or rash--in extension brace today, no changes.   MSK:     Apparent deformities.  Right lower extremity extension brace donned  Neurologic exam:  Cognition: AAO to person, place, time and event.  Follows all simple commands. + Baseline higher-level cognitive deficits in insight and attention  Insight: Poor  insight into current condition.  Mood: Pleasant affect, appropriate mood.  Sensation, reflexes, cranial nerves, coordination, and tone without apparent deficits.        Strength:                RUE: 5/5 SA, 5/5 EF, 5/5 EE, 5/5 WE, 5/5 FF, 5/5 FA                 LUE: 5/5 SA, 5/5 EF, 5/5 EE, 5/5 WE, 5/5 FF, 5/5 FA                 RLE: Limited due to extension brace; 2/5 HF, 4/5 DF, 4/5 PF                LLE:  4/5 HF, 4/5 KE, 5/5 DF, 5/5 EHL, 5/5 PF   Unchanged 3/5     Assessment/Plan: 1. Functional deficits which require 3+ hours per day of interdisciplinary therapy in a comprehensive inpatient rehab setting. Physiatrist is providing close team supervision and 24 hour management of active medical problems listed below. Physiatrist and rehab team continue to assess barriers to discharge/monitor patient progress toward functional and medical goals  Care Tool:  Bathing    Body parts bathed by patient: Right arm,  Left arm, Chest, Abdomen   Body parts bathed by helper: Front perineal area, Buttocks, Right upper leg, Left upper leg, Right lower leg, Left lower leg Body parts n/a: Right upper leg, Right lower leg   Bathing assist Assist Level: Maximal Assistance - Patient 24 - 49%     Upper Body Dressing/Undressing Upper body dressing   What is the patient wearing?: Pull over shirt    Upper body assist Assist Level: Minimal Assistance - Patient > 75%    Lower Body Dressing/Undressing Lower body dressing      What is the patient wearing?: Incontinence brief     Lower body assist Assist for lower body dressing: 2 Helpers     Toileting Toileting    Toileting assist Assist for toileting: Moderate Assistance - Patient 50 - 74%     Transfers Chair/bed transfer  Transfers assist     Chair/bed transfer assist level: Contact Guard/Touching assist     Locomotion Ambulation   Ambulation assist   Ambulation activity did not occur: Safety/medical concerns  Assist level:  2 helpers (w/c follow) Assistive device: Walker-rolling Max distance: 75ft   Walk 10 feet activity   Assist  Walk 10 feet activity did not occur: Safety/medical concerns  Assist level: 2 helpers Assistive device: Walker-rolling   Walk 50 feet activity   Assist Walk 50 feet with 2 turns activity did not occur: Safety/medical concerns         Walk 150 feet activity   Assist Walk 150 feet activity did not occur: Safety/medical concerns         Walk 10 feet on uneven surface  activity   Assist Walk 10 feet on uneven surfaces activity did not occur: Safety/medical concerns         Wheelchair     Assist Is the patient using a wheelchair?: Yes Type of Wheelchair: Manual    Wheelchair assist level: Supervision/Verbal cueing Max wheelchair distance: 125'    Wheelchair 50 feet with 2 turns activity    Assist        Assist Level: Supervision/Verbal cueing   Wheelchair 150 feet activity     Assist      Assist Level: Dependent - Patient 0%   Blood pressure 130/70, pulse 94, temperature 97.8 F (36.6 C), temperature source Oral, resp. rate 18, weight 107.2 kg, SpO2 94%.  Medical Problem List and Plan: 1. Functional deficits secondary to right knee patellar dislocation.  Status post right knee arthroscopic assisted patellar reduction with lateral release.  Right knee arthroscopic loose body removal right knee open management of lateral femoral condyle and medial patella fracture 08/27/2023.  Weightbearing as tolerated.  Knee immobilizer when out of bed or when ambulating.             -patient may shower             -ELOS/Goals: 10-14 days MinA/S- 09/15/33              -stable to continue CIR   - 3/3: Will need WC and walker for home; OP therapies only. Making progress but limited by poor pain tolerance.    2.  Antithrombotics: -DVT/anticoagulation:  Mechanical: Antiembolism stockings, thigh (TED hose) Bilateral lower extremities.  Venous Dopplers  negative             -antiplatelet therapy: continue Aspirin 325 mg daily x 2 weeks   3. Pain Management: continue Neurontin 100 mg 3 times daily Robaxin 500 mg 3 times daily, Naprosyn 500 mg  twice daily, oxycodone as needed  -2-27: Poor pain control interrupting sleep and very limiting for therapy progress.  Increase regimen to scheduled Tylenol 1000 mg 3 times daily, Robaxin 750 mg 3 times daily, and scheduled oxycodone 5 mg 3 times daily in addition to 5 mg every 4 hours as needed.  2-28: Improvement in pain control with the above measures, does well with premedicating therapies with PRNs.  Increase nightly gabapentin to 300 mg, can consider switching scheduled oxycodone to long-acting OxyContin 10 mg twice daily over the weekend if no improvement (discussed this with mother/therapies, patient will endorse severe pain regardless)  3/3: Using PRN oxycodone overnight consistently  3-5: Used decreasing to once daily; can likely reduce standing in 1 to 2 days   4. Mood/Behavior/Sleep: Provide emotional support             -antipsychotic agents: N/A  -2-27: Poor sleep due to right leg pain.  Pain control as above, add as needed melatonin  3-3: Sleep remains intermittently interrupted, able to go back to sleep with pain medication.--improving   5. Neuropsych/cognition: This patient is not capable of making decisions on her own behalf.   6. Skin/Wound Care/RASH new 2/28: Routine skin checks  -2-28: Nursing checking with mom regarding any prior medication reactions.  No recently started medications (noticed before AM lasix).  Continue as needed Benadryl.  3/3: Rash appears resolved  3/4: Itching from moisture in extension brace; remove when in bed--improved today   7. Fluids/Electrolytes/Nutrition: Routine in and outs with follow-up chemistries    - K 5.9, Na 132, BUN 62 - IVF at 100 cc/hr 2/26  - 2-28: BUN/creatinine improved with IV fluids, remains hyperglycemic and with potassium up to 6.0;  increase insulin as below, continue IV fluids, give Lasix 40 mg IV today and retest potassium this afternoon.   -- Responsive to Lasix, repeat 5.1. Stop IVF.  Labs in AM ordered - 3/1: K+ reviewed and normalized   - 3/3: BMP with decreased sodium 129; has been slightly low around 132.  Otherwise, stable. 3/4: Na improved, K stable BMP in AM  8.  Prader-Willi syndrome.  Patient lives with parents independent prior to admission   -Will get behavioral plan in place to assist with staff understanding patient's cognitive baseline and motivation; per mom, does well with food motivators and puzzles/coloring books.  Would use food sparingly given uncontrolled diabetes.  9.  Diabetes mellitus.  Hemoglobin A1c 9.5.  continue NovoLog 6 units 3 times daily, Semglee 55 units nightly.  Glycemia likely complicated by poor pain control   - home regimen per chart review endocrinology 06/22/23: Basal insulin: Continue Lantus to 50 units daily Prandial insulin: Continue Novolog 12 units with meals Correctional insulin: Continue Novolog 2: 50 > 150 with meals and the half dose before bed Non-insulin agents: - tirzepatide 5 mg weekly, increase as able (is currently finishing out their last ozempic pen before switching) - Continue metformin ER 1000 mg bid - Continue empagliflozin 25 mg daily    - Per mom, baseline blood sugar between 150 and 300    - Was switched to semglee 32 U BID yesterday; IV fluids today   2/28: Resume home metformin, hold Jardiance due to hyperkalemia, increase Lantus to 45 units twice daily  Regimen adjusted as per diabetic coordinator recommendations-- 13U premeal + SSI + 50U lantis BID  3/3: BG remains elevated; increase lantus to 60 U BID, premeal to 15 U TID--improved this afternoon.  If potassium remains stable  and sodium improved in a.m. labs, will resume Jardiance-- 3/4: K stable, ordered Jardiance 25 mg daily 3-5: Blood glucose down today, with reduction in pain and resumption of  home medications concern for overtreatment; switch Premeal insulin to home 12 units 3 times daily, and long-acting Semglee to 30 units twice daily.  Okay to go a little high to avoid overnight lows, as she generally is lowest first thing in the morning.  Will titrate long-acting further based on a.m. reads.  Recent Labs    09/08/23 1200 09/08/23 1644 09/08/23 2047  GLUCAP 101* 126* 160*     10.  Hypertension.  Continue Lisinopril 40 mg daily, Toprol-XL 25 mg daily    - normotensive    09/08/2023    4:30 AM 09/07/2023    9:18 PM 09/07/2023    3:49 PM  Vitals with BMI  Systolic 130 123 295  Diastolic 70 61 70  Pulse 94 95     11.  Morbid obesity.  BMI 44.88.  Dietary follow-up.  Complicated by significant food motivation due to Prader-Willi syndrome.    LOS: 7 days A FACE TO FACE EVALUATION WAS PERFORMED  Angelina Sheriff 09/08/2023, 8:27 AM

## 2023-09-08 NOTE — Progress Notes (Signed)
 Occupational Therapy Session Note  Patient Details  Name: Linda Mason [facility] MRN: [id number] Date of Birth: 03-Mar-1996  Today's Date: 09/08/2023 OT Individual Time: 1045-1200 OT Individual Time Calculation (min): 75 min    Short Term Goals: Week 1:  OT Short Term Goal 1 (Week 1): Pt will complete sit > stand in prep for ADL with min A using LRAD OT Short Term Goal 2 (Week 1): Pt will complete 1/3 toileting steps with CGA for balance OT Short Term Goal 3 (Week 1): Pt will complete toilet transfer with mod A using LRAD  Skilled Therapeutic Interventions/Progress Updates:    OT intervention with focus on sit<>stand, standing balance, anterior weight shifts seated in w/c while reaching outside BOS, and safety awareness to increase independence with BADLs. BUE activities with basketball at bounce back and ball taps with 2# bar-4x10 with each task. Sit<>stand from w/c with CGA x5. Standing task at mirror placing Squigz on mirror and removing. Pt also cleaned Squigz after donning gloves. Pt propelled w/c from room to main gym and returned at end of session. Pt with stready progress with sit<>stand and standing balance. Pt remained in w/c with all needs within reach. Belt alarm activated. Mother present.   Therapy Documentation Precautions:  Precautions Precautions: Knee Precaution/Restrictions Comments: Prader-willi syndrome Required Braces or Orthoses: Knee Immobilizer - Right Knee Immobilizer - Right: On when out of bed or walking Restrictions Weight Bearing Restrictions Per Provider Order: Yes RLE Weight Bearing Per Provider Order: Weight bearing as tolerated   Pain: Pt denied pain this morning  Therapy/Group: Individual Therapy  Rich Brave 09/08/2023, 12:09 PM

## 2023-09-08 NOTE — Progress Notes (Signed)
 Physical Therapy Session Note  Patient Details  Name: Linda Mason MRN: [id number] Date of Birth: Apr 21, 1996  Today's Date: 09/08/2023 PT Individual Time: 1191-4782 PT Individual Time Calculation (min): 71 min   Short Term Goals: Week 1:  PT Short Term Goal 1 (Week 1): Pt will complete bed mobility with CGA PT Short Term Goal 2 (Week 1): Pt will complete transfer with LRAD with min assist PT Short Term Goal 3 (Week 1): Pt will ambulate 20' with min assist and 2nd person WC follow  Skilled Therapeutic Interventions/Progress Updates:      Therapy Documentation Precautions:  Precautions Precautions: Knee Precaution/Restrictions Comments: Prader-willi syndrome Required Braces or Orthoses: Knee Immobilizer - Right Knee Immobilizer - Right: On when out of bed or walking Restrictions Weight Bearing Restrictions Per Provider Order: Yes RLE Weight Bearing Per Provider Order: Weight bearing as tolerated  Pt received with nursing for toileting and PT present for direct handoff. Pt supervision w/c mobility in session hospital room<>ortho gym.   Pt engaged in dynamic standing involving contralateral reaching to address activity tolerance deficits 4 x 2 min while performing cognition activity of matching cards supervision.   Pt transitioned to UE contralateral punches with boxing gloves to emphasize speed and coordination 5 x 40 seconds with frequent rest breaks for energy conservation.   Pt performed following strength exercises with 2.2# weighted ball x 10:   -OH press  -chest press   -chops in bilateral directions   Pt CGA with all stand pivot transfers with RW throughout session and left seated at bedside with all need in reach, alarm on.   Pt with unrated R LE pain, nurse provided medication in session and PT provided rest/repositioning for relief.     Therapy/Group: Individual Therapy  Truitt Leep Truitt Leep PT, DPT  09/08/2023, 3:50 PM

## 2023-09-09 ENCOUNTER — Ambulatory Visit (HOSPITAL_COMMUNITY): Payer: MEDICAID | Attending: Physical Medicine and Rehabilitation

## 2023-09-09 DIAGNOSIS — R609 Edema, unspecified: Secondary | ICD-10-CM | POA: Diagnosis present

## 2023-09-09 LAB — BASIC METABOLIC PANEL
Anion gap: 11 (ref 5–15)
BUN: 20 mg/dL (ref 6–20)
CO2: 24 mmol/L (ref 22–32)
Calcium: 8.7 mg/dL — ABNORMAL LOW (ref 8.9–10.3)
Chloride: 101 mmol/L (ref 98–111)
Creatinine, Ser: 0.53 mg/dL (ref 0.44–1.00)
GFR, Estimated: >60 mL/min (ref >60)
Glucose, Bld: 108 mg/dL — ABNORMAL HIGH (ref 70–99)
Potassium: 4.5 mmol/L (ref 3.5–5.1)
Sodium: 136 mmol/L (ref 135–145)

## 2023-09-09 LAB — GLUCOSE, CAPILLARY
Glucose-Capillary: 107 mg/dL — ABNORMAL HIGH (ref 70–99)
Glucose-Capillary: 139 mg/dL — ABNORMAL HIGH (ref 70–99)
Glucose-Capillary: 107 mg/dL — ABNORMAL HIGH (ref 70–99)
Glucose-Capillary: 110 mg/dL — ABNORMAL HIGH (ref 70–99)
Glucose-Capillary: 125 mg/dL — ABNORMAL HIGH (ref 70–99)
Glucose-Capillary: 106 mg/dL — ABNORMAL HIGH (ref 70–99)

## 2023-09-09 MED ORDER — DICLOFENAC SODIUM 1 % EX GEL
2.0000 g | Freq: Four times a day (QID) | CUTANEOUS | Status: DC
  Administered 2023-09-09 – 2023-09-16 (×20): 2 g via TOPICAL
  Filled 2023-09-09: qty 100

## 2023-09-09 MED ORDER — OXYCODONE HCL 5 MG PO TABS
5.0000 mg | ORAL_TABLET | ORAL | Status: DC | PRN
  Administered 2023-09-09 – 2023-09-14 (×13): 5 mg via ORAL
  Filled 2023-09-09 (×15): qty 1

## 2023-09-09 NOTE — Progress Notes (Signed)
 Occupational Therapy Session Note  Patient Details  Name: Linda Mason [facility] MRN: [id number] Date of Birth: [birth year]  Today's Date: 09/09/2023 OT Individual Time: 4098-1191 OT Individual Time Calculation (min): 40 min    Short Term Goals: Week 1:  OT Short Term Goal 1 (Week 1): Pt will complete sit > stand in prep for ADL with min A using LRAD OT Short Term Goal 2 (Week 1): Pt will complete 1/3 toileting steps with CGA for balance OT Short Term Goal 3 (Week 1): Pt will complete toilet transfer with mod A using LRAD  Skilled Therapeutic Interventions/Progress Updates:    OT intervention with focus on w/c mobility, sit<>stand, and standing balance to increase independence with BADLs and functional transfers. Standing tasks for corn hole and peg board tasks. Pt completed all activities with close supervision and no LOB. Sit<>stand x 4 with rest breaks. Pt used BUE during cleaning tasks. W/c mobility in hallways with supervision. Pt remained in w/c in day room awaiting start of dance group. Rehab tech present.   Therapy Documentation Precautions:  Precautions Precautions: Knee Precaution/Restrictions Comments: Prader-willi syndrome Required Braces or Orthoses: Knee Immobilizer - Right Knee Immobilizer - Right: On when out of bed or walking Restrictions Weight Bearing Restrictions Per Provider Order: Yes RLE Weight Bearing Per Provider Order: Weight bearing as tolerated   Pain: Pt denies pain this afternoon    Therapy/Group: Individual Therapy  Rich Brave 09/09/2023, 1:57 PM

## 2023-09-09 NOTE — Progress Notes (Signed)
 PROGRESS NOTE   Subjective/Complaints:  No events overnight.  Patient complaining of increased swelling in her left leg today, along with discomfort on palpation.  Was having some pain in her right knee this morning, but has improved since her brace was adjusted.  No other complaints, sleeping much better now.  ROS: Denies fevers, chills, N/V, abdominal pain, constipation, diarrhea, SOB, cough, chest pain, new weakness or paraesthesias.    + LLE swelling + R leg itching--resolved  Objective:   No results found. Recent Labs    09/06/23 1219  WBC 6.2  HGB 12.7  HCT 38.9  PLT 65*   Recent Labs    09/06/23 1219 09/07/23 0529  NA 129* 132*  K 4.4 4.7  CL 92* 96*  CO2 27 29  GLUCOSE 171* 280*  BUN 19 18  CREATININE 0.49 0.52  CALCIUM 9.0 9.0    Intake/Output Summary (Last 24 hours) at 09/09/2023 0941 Last data filed at 09/09/2023 0837 Gross per 24 hour  Intake 940 ml  Output --  Net 940 ml        Physical Exam: Vital Signs Blood pressure (!) 140/85, pulse 84, temperature 97.8 F (36.6 C), resp. rate 18, weight 107.2 kg, SpO2 93%.  Constitutional: No apparent distress. Appropriate appearance for age.  Morbidly obese.   HENT: Atraumatic, normocephalic. Eyes: PERRLA. EOMI. Visual fields grossly intact.  Cardiovascular: RRR, no murmurs/rub/gallops.  2+ bilateral lower extremity edema. + ttp L calf. Respiratory: CTAB. No rales, rhonchi, or wheezing.  On room air. Abdomen: + bowel sounds, normoactive. No distention or tenderness.  Skin: C/D/I. Mild swelling and erythema superior to medial patella; no obvious urticaria or rash--in extension brace--no changes    MSK:     Apparent deformities.  Right lower extremity extension brace donned  Neurologic exam:  Cognition: AAO to person, place, time and event.  Follows all simple commands. + Baseline higher-level cognitive deficits in insight and attention  Insight:  Poor insight into current condition.  Mood: Pleasant affect, appropriate mood.  Sensation, reflexes, cranial nerves, coordination, and tone without apparent deficits.        Strength:                RUE: 5/5 SA, 5/5 EF, 5/5 EE, 5/5 WE, 5/5 FF, 5/5 FA                 LUE: 5/5 SA, 5/5 EF, 5/5 EE, 5/5 WE, 5/5 FF, 5/5 FA                 RLE: Limited due to extension brace; 2/5 HF, 4/5 DF, 4/5 PF                LLE:  4/5 HF, 4/5 KE, 5/5 DF, 5/5 EHL, 5/5 PF   Unchanged 3/6     Assessment/Plan: 1. Functional deficits which require 3+ hours per day of interdisciplinary therapy in a comprehensive inpatient rehab setting. Physiatrist is providing close team supervision and 24 hour management of active medical problems listed below. Physiatrist and rehab team continue to assess barriers to discharge/monitor patient progress toward functional and medical goals  Care Tool:  Bathing  Body parts bathed by patient: Right arm, Left arm, Chest, Abdomen   Body parts bathed by helper: Front perineal area, Buttocks, Right upper leg, Left upper leg, Right lower leg, Left lower leg Body parts n/a: Right upper leg, Right lower leg   Bathing assist Assist Level: Maximal Assistance - Patient 24 - 49%     Upper Body Dressing/Undressing Upper body dressing   What is the patient wearing?: Pull over shirt    Upper body assist Assist Level: Minimal Assistance - Patient > 75%    Lower Body Dressing/Undressing Lower body dressing      What is the patient wearing?: Incontinence brief     Lower body assist Assist for lower body dressing: 2 Helpers     Toileting Toileting    Toileting assist Assist for toileting: Moderate Assistance - Patient 50 - 74%     Transfers Chair/bed transfer  Transfers assist     Chair/bed transfer assist level: Contact Guard/Touching assist     Locomotion Ambulation   Ambulation assist   Ambulation activity did not occur: Safety/medical concerns  Assist  level: 2 helpers (w/c follow) Assistive device: Walker-rolling Max distance: 2ft   Walk 10 feet activity   Assist  Walk 10 feet activity did not occur: Safety/medical concerns  Assist level: 2 helpers Assistive device: Walker-rolling   Walk 50 feet activity   Assist Walk 50 feet with 2 turns activity did not occur: Safety/medical concerns         Walk 150 feet activity   Assist Walk 150 feet activity did not occur: Safety/medical concerns         Walk 10 feet on uneven surface  activity   Assist Walk 10 feet on uneven surfaces activity did not occur: Safety/medical concerns         Wheelchair     Assist Is the patient using a wheelchair?: Yes Type of Wheelchair: Manual    Wheelchair assist level: Supervision/Verbal cueing Max wheelchair distance: 125'    Wheelchair 50 feet with 2 turns activity    Assist        Assist Level: Supervision/Verbal cueing   Wheelchair 150 feet activity     Assist      Assist Level: Dependent - Patient 0%   Blood pressure (!) 140/85, pulse 84, temperature 97.8 F (36.6 C), resp. rate 18, weight 107.2 kg, SpO2 93%.  Medical Problem List and Plan: 1. Functional deficits secondary to right knee patellar dislocation.  Status post right knee arthroscopic assisted patellar reduction with lateral release.  Right knee arthroscopic loose body removal right knee open management of lateral femoral condyle and medial patella fracture 08/27/2023.  Weightbearing as tolerated.  Knee immobilizer when out of bed or when ambulating.             -patient may shower             -ELOS/Goals: 10-14 days MinA/S- 09/15/33              -stable to continue CIR   - 3/3: Will need WC and walker for home; OP therapies only. Making progress but limited by poor pain tolerance.    2.  Antithrombotics: -DVT/anticoagulation:  Mechanical: Antiembolism stockings, thigh (TED hose) Bilateral lower extremities.  Venous Dopplers negative              -antiplatelet therapy: continue Aspirin 325 mg daily x 2 weeks   3. Pain Management: continue Neurontin 100 mg 3 times daily Robaxin 500 mg 3  times daily, Naprosyn 500 mg twice daily, oxycodone as needed  -2-27: Poor pain control interrupting sleep and very limiting for therapy progress.  Increase regimen to scheduled Tylenol 1000 mg 3 times daily, Robaxin 750 mg 3 times daily, and scheduled oxycodone 5 mg 3 times daily in addition to 5 mg every 4 hours as needed.  2-28: Improvement in pain control with the above measures, does well with premedicating therapies with PRNs.  Increase nightly gabapentin to 300 mg, can consider switching scheduled oxycodone to long-acting OxyContin 10 mg twice daily over the weekend if no improvement (discussed this with mother/therapies, patient will endorse severe pain regardless)  3/3: Using PRN oxycodone overnight consistently  3-5: Used decreasing to once daily; can likely reduce standing in 1 to 2 days  3-6: Last as needed used 3-4.  Will DC standing oxycodone   4. Mood/Behavior/Sleep: Provide emotional support             -antipsychotic agents: N/A  -2-27: Poor sleep due to right leg pain.  Pain control as above, add as needed melatonin  3-3: Sleep remains intermittently interrupted, able to go back to sleep with pain medication.--Resolved   5. Neuropsych/cognition: This patient is not capable of making decisions on her own behalf.   6. Skin/Wound Care/RASH new 2/28: Routine skin checks  -2-28: Nursing checking with mom regarding any prior medication reactions.  No recently started medications (noticed before AM lasix).  Continue as needed Benadryl.  3/3: Rash appears resolved  3/4: Itching from moisture in extension brace; remove when in bed--improved     7. Fluids/Electrolytes/Nutrition: Routine in and outs with follow-up chemistries    - K 5.9, Na 132, BUN 62 - IVF at 100 cc/hr 2/26  - 2-28: BUN/creatinine improved with IV fluids, remains  hyperglycemic and with potassium up to 6.0; increase insulin as below, continue IV fluids, give Lasix 40 mg IV today and retest potassium this afternoon.   -- Responsive to Lasix, repeat 5.1. Stop IVF.  Labs in AM ordered - 3/1: K+ reviewed and normalized   - 3/3: BMP with decreased sodium 129; has been slightly low around 132.  Otherwise, stable. 3/4: Na improved, K stable 3-6: BMP stable, continue current regimen  8.  Prader-Willi syndrome.  Patient lives with parents independent prior to admission   -Will get behavioral plan in place to assist with staff understanding patient's cognitive baseline and motivation; per mom, does well with food motivators and puzzles/coloring books.  Would use food sparingly given uncontrolled diabetes.  9.  Diabetes mellitus.  Hemoglobin A1c 9.5.  continue NovoLog 6 units 3 times daily, Semglee 55 units nightly.  Glycemia likely complicated by poor pain control   - home regimen per chart review endocrinology 06/22/23: Basal insulin: Continue Lantus to 50 units daily Prandial insulin: Continue Novolog 12 units with meals Correctional insulin: Continue Novolog 2: 50 > 150 with meals and the half dose before bed Non-insulin agents: - tirzepatide 5 mg weekly, increase as able (is currently finishing out their last ozempic pen before switching) - Continue metformin ER 1000 mg bid - Continue empagliflozin 25 mg daily    - Per mom, baseline blood sugar between 150 and 300    - Was switched to semglee 32 U BID yesterday; IV fluids today   2/28: Resume home metformin, hold Jardiance due to hyperkalemia, increase Lantus to 45 units twice daily  Regimen adjusted as per diabetic coordinator recommendations-- 13U premeal + SSI + 50U lantis BID  3/3:  BG remains elevated; increase lantus to 60 U BID, premeal to 15 U TID--improved this afternoon.  If potassium remains stable and sodium improved in a.m. labs, will resume Jardiance-- 3/4: K stable, ordered Jardiance 25 mg  daily 3-5: Blood glucose down today, with reduction in pain and resumption of home medications concern for overtreatment; switch Premeal insulin to home 12 units 3 times daily, and long-acting Semglee to 30 units twice daily.  Okay to go a little high to avoid overnight lows, as she generally is lowest first thing in the morning.  Will titrate long-acting further based on a.m. reads. 3/6: Blood sugars in normal range with adjusted dosing; can likely resume home medication regimen at discharge  Recent Labs    09/09/23 0540 09/09/23 0815 09/09/23 1128  GLUCAP 107* 139* 107*     10.  Hypertension.  Continue Lisinopril 40 mg daily, Toprol-XL 25 mg daily    - normotensive    09/09/2023    5:37 AM 09/08/2023    7:27 PM 09/08/2023    2:19 PM  Vitals with BMI  Systolic 140 122 098  Diastolic 85 61 68  Pulse 84 95 87    11.  Morbid obesity.  BMI 44.88.  Dietary follow-up.  Complicated by significant food motivation due to Prader-Willi syndrome.   12.  Bilateral lower extremity edema.  Will get duplexes today; if negative, will get Ace wrap for left lower extremity and elevate in bed.  LOS: 8 days A FACE TO FACE EVALUATION WAS PERFORMED  Angelina Sheriff 09/09/2023, 9:41 AM

## 2023-09-09 NOTE — Progress Notes (Signed)
 Physical Therapy Session Note  Patient Details  Name: Linda Mason MRN: [id number] Date of Birth: February 14, 1996  Today's Date: 09/09/2023 PT Individual Time: 3329-5188 PT Individual Time Calculation (min): 58 min   Short Term Goals: Week 1:  PT Short Term Goal 1 (Week 1): Pt will complete bed mobility with CGA PT Short Term Goal 2 (Week 1): Pt will complete transfer with LRAD with min assist PT Short Term Goal 3 (Week 1): Pt will ambulate 20' with min assist and 2nd person WC follow  Skilled Therapeutic Interventions/Progress Updates: Pt presents sitting in w/c and agreeable to therapy.  Knee immobilizer doffed and knee ace wrapped and threaded pants over feet and pulled as high as possible.  Knee immobilizer donned w/ assist from nursing.  Pt transferred sit to stand w/ CGA and cues for hand placement, although pushes from RW.  Pt required min/mod A for assist to complete pants and then re-adjusted immobilizer for better fit.  Pt wheeled to main gym w/ supervision.  Pt amb 57' and then 58' w/ RW and min/CGA, cues for walker management, sometimes pushes too far.  Pt stops during gait to greet other therapists.  Pt pleased w/ gait.  Pt remained sitting in w/c w/ chair alarm on and all needs in reach, mother present throughout session.     Therapy Documentation Precautions:  Precautions Precautions: Knee Precaution/Restrictions Comments: Prader-willi syndrome Required Braces or Orthoses: Knee Immobilizer - Right Knee Immobilizer - Right: On when out of bed or walking Restrictions Weight Bearing Restrictions Per Provider Order: Yes RLE Weight Bearing Per Provider Order: Weight bearing as tolerated General:   Vital Signs:   Pain:7/10 Pain Assessment Pain Scale: 0-10 Pain Score: 7     Therapy/Group: Individual Therapy  Lucio Edward 09/09/2023, 12:07 PM

## 2023-09-09 NOTE — Progress Notes (Signed)
 Patient ID: Aleiyah Halpin [facility], female   DOB: Sep 06, 1995, 28 y.o.   MRN: [id number]  SW ordered RW and W/c with [employer] via parachute.   Cecile Sheerer, MSW, LCSW Office: [phone] Cell: [phone] Fax: [phone]

## 2023-09-09 NOTE — Progress Notes (Signed)
 Physical Therapy Session Note  Patient Details  Name: Linda Mason MRN: [id number] Date of Birth: May 02, 1996  Today's Date: 09/09/2023 PT Individual Time: 1020-1113 PT Individual Time Calculation (min): 53 min   Short Term Goals: Week 1:  PT Short Term Goal 1 (Week 1): Pt will complete bed mobility with CGA PT Short Term Goal 2 (Week 1): Pt will complete transfer with LRAD with min assist PT Short Term Goal 3 (Week 1): Pt will ambulate 20' with min assist and 2nd person WC follow  Skilled Therapeutic Interventions/Progress Updates:     Pt received seated in w/c and agreeable to therapy session focusing on ambulation. Pt has no c/o pain at this time.   Pt self propelled to therapy gym with supervision for gait training.  Gait training: CGA and verbal cues for upright posture - pt has tendency to lean forward on RW, decreased step length with LLE and decreased stance time with RLE - 46' - 62', one standing rest break during bout, longer seated rest break following this bout - 50'  Pt also performed lego activity to improve turning during ambulation and cognitive dual task - retrieved legos needed from different piles before returning to w/c to build piece with CGA. Pt significantly fatigued from retrieving pieces, so longer active rest break taken with lego building to improve fine motor skills.   Pt self propelled back to room and left seated in w/c with all needs in reach and seat belt alarm on.   Therapy Documentation Precautions:  Precautions Precautions: Knee Precaution/Restrictions Comments: Prader-willi syndrome Required Braces or Orthoses: Knee Immobilizer - Right Knee Immobilizer - Right: On when out of bed or walking Restrictions Weight Bearing Restrictions Per Provider Order: Yes RLE Weight Bearing Per Provider Order: Weight bearing as tolerated General:       Therapy/Group: Individual Therapy  [name] 09/09/2023, 12:50 PM

## 2023-09-09 NOTE — Progress Notes (Signed)
 Left lower extremity venous performed as patient declined having both legs evaluated. Nurse at bedside.  Results can be found in chart review under CV Proc.  09/09/2023 5:26 PM  Danetta Prom Durenda Age, RVT.

## 2023-09-09 NOTE — Progress Notes (Signed)
 Patient ID: Mychele Seyller [facility], female   DOB: [birth year], 28 y.o.   MRN: [id number]  SW met with pt mother sing [employer] Spanish interpreter Sue Lush [id number] to provide updates from team conference, and d/c date 3/13. SW discussed ramp needed in order for her to get into the home. She confirms pt is in a day program M-F for 4hrs,and she is always home when pt returns home. She is aware SW will order DME- w/c and discussed her purchasing a RW as not likely covered under insurance. Fam edu on Monday 9am-12pm. SW informed on outpatient therapies recommended. SW answered her mother's questions in regards to previous visits, and the contact numbers that were provided from insurance.   Cecile Sheerer, MSW, LCSW Office: [phone] Cell: [phone] Fax: [phone]

## 2023-09-09 NOTE — Progress Notes (Signed)
 Physical Therapy Session Note  Patient Details  Name: Linda Mason MRN: [id number] Date of Birth: 04-12-1996  Today's Date: 09/09/2023 PT Individual Time: 1402-1500 PT Individual Time Calculation (min): 58 min   Short Term Goals: Week 1:  PT Short Term Goal 1 (Week 1): Pt will complete bed mobility with CGA PT Short Term Goal 2 (Week 1): Pt will complete transfer with LRAD with min assist PT Short Term Goal 3 (Week 1): Pt will ambulate 20' with min assist and 2nd person WC follow  Skilled Therapeutic Interventions/Progress Updates:    Pt presents in day room in [facility], motivated to participate with therapy due to pt expressing excitement for dance group. Pt does not report pain at this time. Session focused on gait training, transfer training, and therapeutic exercise in group setting for improving tolerance to activity as well as improving participation with therapies.  Pt agreeable to gait trial, pt completes sit<>stand with CGA to RW, pt ambulates with RW 45' with CGA demonstrating improved gait mechanics with BUE support on RW handles. Pt continues to demonstrate antalgic gait with decreased RLE stance phase however improving weight tolerance noted. Pt walks to external cue (basketball goal) and back to [facility] with pt demonstrating improved ability to turn to sit, min cues for positioning prior to sitting.  Pt requires extended seated rest break, during which therapist attempts to prompt pt to trial standing during dance group with pt refusing. Pt completes x5 sit<>stands with CGA/supervision to RW, increased time to complete.  Pt then participates with dance group for BUE/LLE/core strengthening and tolerance to activity as well as for improving participation with activities of enjoyment. Pt actively participates with all songs with therapist providing skilled cues for adapting dance moves to allow improved participation with pt with limited ROM for RLE.  Pt remains seated in WC in dance  group at end of session.  Therapy Documentation Precautions:  Precautions Precautions: Knee Precaution/Restrictions Comments: Prader-willi syndrome Required Braces or Orthoses: Knee Immobilizer - Right Knee Immobilizer - Right: On when out of bed or walking Restrictions Weight Bearing Restrictions Per Provider Order: Yes RLE Weight Bearing Per Provider Order: Weight bearing as tolerated    Therapy/Group: Individual Therapy  Edwin Cap PT, DPT 09/09/2023, 3:29 PM

## 2023-09-10 ENCOUNTER — Other Ambulatory Visit (HOSPITAL_COMMUNITY): Payer: Self-pay

## 2023-09-10 ENCOUNTER — Telehealth (HOSPITAL_COMMUNITY): Payer: Self-pay | Admitting: Pharmacy Technician

## 2023-09-10 LAB — GLUCOSE, CAPILLARY
Glucose-Capillary: 106 mg/dL — ABNORMAL HIGH (ref 70–99)
Glucose-Capillary: 100 mg/dL — ABNORMAL HIGH (ref 70–99)
Glucose-Capillary: 112 mg/dL — ABNORMAL HIGH (ref 70–99)
Glucose-Capillary: 169 mg/dL — ABNORMAL HIGH (ref 70–99)

## 2023-09-10 MED ORDER — APIXABAN (ELIQUIS) EDUCATION KIT FOR DVT/PE PATIENTS
PACK | Freq: Once | Status: AC
  Filled 2023-09-10: qty 1

## 2023-09-10 MED ORDER — ASPIRIN 325 MG PO TABS
325.0000 mg | ORAL_TABLET | Freq: Every day | ORAL | Status: DC
  Administered 2023-09-11 – 2023-09-16 (×6): 325 mg via ORAL
  Filled 2023-09-10 (×6): qty 1

## 2023-09-10 MED ORDER — APIXABAN 5 MG PO TABS
10.0000 mg | ORAL_TABLET | Freq: Two times a day (BID) | ORAL | Status: DC
  Administered 2023-09-10: 10 mg via ORAL
  Filled 2023-09-10: qty 2

## 2023-09-10 MED ORDER — FUROSEMIDE 20 MG PO TABS
20.0000 mg | ORAL_TABLET | Freq: Once | ORAL | Status: AC
  Administered 2023-09-10: 20 mg via ORAL
  Filled 2023-09-10: qty 1

## 2023-09-10 MED ORDER — INSULIN GLARGINE 100 UNIT/ML ~~LOC~~ SOLN
30.0000 [IU] | Freq: Two times a day (BID) | SUBCUTANEOUS | Status: AC
  Administered 2023-09-10: 30 [IU] via SUBCUTANEOUS
  Filled 2023-09-10: qty 0.3

## 2023-09-10 MED ORDER — APIXABAN 5 MG PO TABS: 5.0000 mg | ORAL_TABLET | Freq: Two times a day (BID) | ORAL | Status: DC

## 2023-09-10 MED ORDER — INSULIN GLARGINE 100 UNIT/ML ~~LOC~~ SOLN
60.0000 [IU] | Freq: Every day | SUBCUTANEOUS | Status: DC
  Administered 2023-09-11 – 2023-09-16 (×6): 60 [IU] via SUBCUTANEOUS
  Filled 2023-09-10 (×6): qty 0.6

## 2023-09-10 NOTE — Telephone Encounter (Signed)
 Patient Product/process development scientist completed.    The patient is insured through [employer] [place].     Ran test claim for Eliquis 5 mg and the current 30 day co-pay is $4.00.  Ran test claim for Eliquis Starter Pack and the current 30 day co-pay is $4.00.  This test claim was processed through [facility]- copay amounts may vary at other pharmacies due to pharmacy/plan contracts, or as the patient moves through the different stages of their insurance plan.     Roland Earl, CPHT Pharmacy Technician III Certified Patient Advocate [facility] Pharmacy Patient Advocate Team Direct Number: [phone]  Fax: [phone]

## 2023-09-10 NOTE — Progress Notes (Signed)
 Occupational Therapy Session Note  Patient Details  Name: Linda Mason MRN: [id number] Date of Birth: 06-Feb-1996  Today's Date: 09/10/2023 OT Individual Time: 1000-1057 OT Individual Time Calculation (min): 57 min    Short Term Goals: Week 1:  OT Short Term Goal 1 (Week 1): Pt will complete sit > stand in prep for ADL with min A using LRAD OT Short Term Goal 2 (Week 1): Pt will complete 1/3 toileting steps with CGA for balance OT Short Term Goal 3 (Week 1): Pt will complete toilet transfer with mod A using LRAD  Skilled Therapeutic Interventions/Progress Updates: Patient received sitting in w/c requesting to get in the shower. Patient's parents present. Assisted to wrap the KI while patient was seated in the w/c. Used the Friendly to stand from w/c and transfer into the shower. Patient able to doff dress without assist. Once set up in the shower patient wanted mother to wash her hair. Patient needing encouragement to perform bathing of Ue's and face. Required Max assist over all with bathing and drying. Patient able to don sports bra and long sleeve shirt with minimal assist. Stood with with stedy to dry buttocks and don brief. Returned to the w/c to don pants. Patient needed Max/Total assist to don pants over KI, but did assist with pants management up over hips while standing at the RW. Continued treatment session in the therapy gym working on UE strength and endurance using 2 lb dowel. Performed 2 x 10 reps in varied planes. Good participation throughout treatment.      Therapy Documentation Precautions:  Precautions Precautions: Knee Precaution/Restrictions Comments: Prader-willi syndrome Required Braces or Orthoses: Knee Immobilizer - Right Knee Immobilizer - Right: On when out of bed or walking Restrictions Weight Bearing Restrictions Per Provider Order: Yes RLE Weight Bearing Per Provider Order: Weight bearing as tolerated General:   Vital Signs:   Pain:Expressed pain with  stand in stedy for transfer when asked, but did not quantify and reported being comfortable once completing the shower transfer.    ADL: ADL Eating: Set up Where Assessed-Eating: Wheelchair Grooming: Supervision/safety Where Assessed-Grooming: Wheelchair Upper Body Bathing: Supervision/safety Where Assessed-Upper Body Bathing: Wheelchair Lower Body Bathing: Maximal assistance Where Assessed-Lower Body Bathing: Other (Comment) (BSC) Upper Body Dressing: Minimal assistance Where Assessed-Upper Body Dressing: Wheelchair Lower Body Dressing: Maximal assistance Where Assessed-Lower Body Dressing: Other (Comment) (BSC) Toileting: Dependent Where Assessed-Toileting: Bedside Commode Toilet Transfer: Dependent Toilet Transfer Method: Other (comment) (stedy) Toilet Transfer Equipment: Bedside commode Tub/Shower Transfer: Unable to assess Tub/Shower Transfer Method: Unable to assess [employer] Transfer: Unable to assess [employer] Method: Unable to assess       Therapy/Group: Individual Therapy  Warnell Forester 09/10/2023, 12:19 PM

## 2023-09-10 NOTE — Progress Notes (Signed)
 PROGRESS NOTE   Subjective/Complaints:  Patient complains of ongoing pain in her left hip, as well as swelling in her bilateral legs.  Informed via secure chat overnight that Duplex + LLE DVT; however, on final read, none appreciated. Patient was given initial Eliquis 10 mg dose and stopped naprosyn and aspirin, Eliquis subsequently Dced and aspirin resumed. Discussed this with patient; will have nursing relay to family when they return tonight.   ROS: Denies fevers, chills, N/V, abdominal pain, constipation, diarrhea, SOB, cough, chest pain, new weakness or paraesthesias.    + LLE swelling--ongoing + R leg itching--resolved  Objective:   VAS [place] LOWER EXTREMITY VENOUS (DVT) Result Date: 09/09/2023  Lower Venous DVT Study Patient Name:  Linda Mason  Date of Exam:   09/09/2023 Medical Rec #: [id number]              Accession #:    [id number] Date of Birth: 08-29-1995              Patient Gender: F Patient Age:   28 years Exam Location:  [facility] Procedure:      VAS [place] LOWER EXTREMITY VENOUS (DVT) Referring Phys: Elijah Birk --------------------------------------------------------------------------------  Indications: Swelling.  Limitations: Intellectual delay, patient cried the entire exam, disrupting hemodynamics. Comparison Study: Previous study on 2.21.2025. Performing Technologist: Fernande Bras  Examination Guidelines: A complete evaluation includes B-mode imaging, spectral Doppler, color Doppler, and power Doppler as needed of all accessible portions of each vessel. Bilateral testing is considered an integral part of a complete examination. Limited examinations for reoccurring indications may be performed as noted. The reflux portion of the exam is performed with the patient in reverse Trendelenburg.  +-----+---------------+---------+-----------+----------+--------------+  RIGHTCompressibilityPhasicitySpontaneityPropertiesThrombus Aging +-----+---------------+---------+-----------+----------+--------------+ CFV  Full           Yes      Yes                                 +-----+---------------+---------+-----------+----------+--------------+ SFJ  Full           Yes      Yes                                 +-----+---------------+---------+-----------+----------+--------------+   +---------+---------------+---------+-----------+----------+-------------------+ LEFT     CompressibilityPhasicitySpontaneityPropertiesThrombus Aging      +---------+---------------+---------+-----------+----------+-------------------+ CFV      Full           Yes      Yes                                      +---------+---------------+---------+-----------+----------+-------------------+ SFJ      Full           Yes      Yes                                      +---------+---------------+---------+-----------+----------+-------------------+ FV Prox  Full                                                             +---------+---------------+---------+-----------+----------+-------------------+ FV Mid   Full                                                             +---------+---------------+---------+-----------+----------+-------------------+ FV DistalFull           Yes      Yes                                      +---------+---------------+---------+-----------+----------+-------------------+ PFV      Full                                                             +---------+---------------+---------+-----------+----------+-------------------+ POP      Full           Yes      Yes                                      +---------+---------------+---------+-----------+----------+-------------------+ PTV                                                   Not visualized,                                                            patent by color.    +---------+---------------+---------+-----------+----------+-------------------+ PERO                                                  Not visualized,                                                           patent by color.    +---------+---------------+---------+-----------+----------+-------------------+     Summary: RIGHT: - No evidence of common femoral vein obstruction.   LEFT: - There is no evidence of deep vein thrombosis in the lower extremity. However, portions of this examination were limited- see technologist comments above.  - No cystic structure found in the  popliteal fossa.  *See table(s) above for measurements and observations. Electronically signed by Heath Lark on 09/09/2023 at 8:37:55 PM.    Final    No results for input(s): "WBC", "HGB", "HCT", "PLT" in the last 72 hours.  Recent Labs    09/09/23 1139  NA 136  K 4.5  CL 101  CO2 24  GLUCOSE 108*  BUN 20  CREATININE 0.53  CALCIUM 8.7*    Intake/Output Summary (Last 24 hours) at 09/10/2023 1504 Last data filed at 09/10/2023 1300 Gross per 24 hour  Intake 236 ml  Output --  Net 236 ml        Physical Exam: Vital Signs Blood pressure 117/72, pulse 78, temperature 97.9 F (36.6 C), resp. rate 18, weight 107.2 kg, SpO2 92%.  Constitutional: No apparent distress. Appropriate appearance for age.  Morbidly obese.   HENT: Atraumatic, normocephalic. Eyes: PERRLA. EOMI. Visual fields grossly intact.  Cardiovascular: RRR, no murmurs/rub/gallops.  2+ bilateral lower extremity edema. + ttp L calf--unchanged Respiratory: CTAB. No rales, rhonchi, or wheezing.  On room air. Abdomen: + bowel sounds, normoactive. No distention or tenderness.  Skin: C/D/I. Mild swelling and erythema superior to medial patella; no obvious urticaria or rash--in extension brace--no changes    MSK:     Apparent deformities.  Right lower extremity extension brace donned  Neurologic exam:  Cognition: AAO to  person, place, time and event.  Follows all simple commands. + Baseline higher-level cognitive deficits in insight and attention  Insight: Poor insight into current condition.  Mood: Pleasant affect, appropriate mood.  Sensation, reflexes, cranial nerves, coordination, and tone without apparent deficits.        Strength:                RUE: 5/5 SA, 5/5 EF, 5/5 EE, 5/5 WE, 5/5 FF, 5/5 FA                 LUE: 5/5 SA, 5/5 EF, 5/5 EE, 5/5 WE, 5/5 FF, 5/5 FA                 RLE: Limited due to extension brace; 2/5 HF, 4/5 DF, 4/5 PF                LLE:  4/5 HF, 4/5 KE, 5/5 DF, 5/5 EHL, 5/5 PF   Unchanged 3/7     Assessment/Plan: 1. Functional deficits which require 3+ hours per day of interdisciplinary therapy in a comprehensive inpatient rehab setting. Physiatrist is providing close team supervision and 24 hour management of active medical problems listed below. Physiatrist and rehab team continue to assess barriers to discharge/monitor patient progress toward functional and medical goals  Care Tool:  Bathing    Body parts bathed by patient: Right arm, Left arm, Chest, Abdomen   Body parts bathed by helper: Front perineal area, Buttocks, Right upper leg, Left upper leg, Right lower leg, Left lower leg Body parts n/a: Right upper leg, Right lower leg   Bathing assist Assist Level: Maximal Assistance - Patient 24 - 49%     Upper Body Dressing/Undressing Upper body dressing   What is the patient wearing?: Pull over shirt    Upper body assist Assist Level: Minimal Assistance - Patient > 75%    Lower Body Dressing/Undressing Lower body dressing      What is the patient wearing?: Incontinence brief     Lower body assist Assist for lower body dressing: 2 Helpers     Toileting Toileting  Toileting assist Assist for toileting: Moderate Assistance - Patient 50 - 74%     Transfers Chair/bed transfer  Transfers assist     Chair/bed transfer assist level: Contact  Guard/Touching assist     Locomotion Ambulation   Ambulation assist   Ambulation activity did not occur: Safety/medical concerns  Assist level: Minimal Assistance - Patient > 75% Assistive device: Walker-rolling Max distance: 43   Walk 10 feet activity   Assist  Walk 10 feet activity did not occur: Safety/medical concerns  Assist level: Minimal Assistance - Patient > 75% Assistive device: Walker-rolling   Walk 50 feet activity   Assist Walk 50 feet with 2 turns activity did not occur: Safety/medical concerns         Walk 150 feet activity   Assist Walk 150 feet activity did not occur: Safety/medical concerns         Walk 10 feet on uneven surface  activity   Assist Walk 10 feet on uneven surfaces activity did not occur: Safety/medical concerns         Wheelchair     Assist Is the patient using a wheelchair?: Yes Type of Wheelchair: Manual    Wheelchair assist level: Supervision/Verbal cueing Max wheelchair distance: 150    Wheelchair 50 feet with 2 turns activity    Assist        Assist Level: Supervision/Verbal cueing   Wheelchair 150 feet activity     Assist      Assist Level: Supervision/Verbal cueing   Blood pressure 117/72, pulse 78, temperature 97.9 F (36.6 C), resp. rate 18, weight 107.2 kg, SpO2 92%.  Medical Problem List and Plan: 1. Functional deficits secondary to right knee patellar dislocation.  Status post right knee arthroscopic assisted patellar reduction with lateral release.  Right knee arthroscopic loose body removal right knee open management of lateral femoral condyle and medial patella fracture 08/27/2023.  Weightbearing as tolerated.  Knee immobilizer when out of bed or when ambulating.             -patient may shower             -ELOS/Goals: 10-14 days MinA/S- 09/15/33              -stable to continue CIR   - 3/3: Will need WC and walker for home; OP therapies only. Making progress but limited by  poor pain tolerance.    2.  Antithrombotics: -DVT/anticoagulation:  Mechanical: Antiembolism stockings, thigh (TED hose) Bilateral lower extremities.  Venous Dopplers negative             -antiplatelet therapy: continue Aspirin 325 mg daily x 2 weeks   3. Pain Management: continue Neurontin 100 mg 3 times daily Robaxin 500 mg 3 times daily, Naprosyn 500 mg twice daily, oxycodone as needed  -2-27: Poor pain control interrupting sleep and very limiting for therapy progress.  Increase regimen to scheduled Tylenol 1000 mg 3 times daily, Robaxin 750 mg 3 times daily, and scheduled oxycodone 5 mg 3 times daily in addition to 5 mg every 4 hours as needed.  2-28: Improvement in pain control with the above measures, does well with premedicating therapies with PRNs.  Increase nightly gabapentin to 300 mg, can consider switching scheduled oxycodone to long-acting OxyContin 10 mg twice daily over the weekend if no improvement (discussed this with mother/therapies, patient will endorse severe pain regardless)  3/3: Using PRN oxycodone overnight consistently  3-5: Used decreasing to once daily; can likely reduce standing in 1 to  2 days  3-6: Last as needed used 3-4.  Will DC standing oxycodone  3-7: Voltaren to L hip QID. DC naprosyn 500 mg BID due to risk of bleeding and peripheral edema   4. Mood/Behavior/Sleep: Provide emotional support             -antipsychotic agents: N/A  -2-27: Poor sleep due to right leg pain.  Pain control as above, add as needed melatonin  3-3: Sleep remains intermittently interrupted, able to go back to sleep with pain medication.--Resolved   5. Neuropsych/cognition: This patient is not capable of making decisions on her own behalf.   6. Skin/Wound Care/RASH new 2/28: Routine skin checks  -2-28: Nursing checking with mom regarding any prior medication reactions.  No recently started medications (noticed before AM lasix).  Continue as needed Benadryl.  3/3: Rash appears  resolved  3/4: Itching from moisture in extension brace; remove when in bed--improved    3/7: Edema management on LLE with ACE wrap when OOB   7. Fluids/Electrolytes/Nutrition: Routine in and outs with follow-up chemistries    - K 5.9, Na 132, BUN 62 - IVF at 100 cc/hr 2/26  - 2-28: BUN/creatinine improved with IV fluids, remains hyperglycemic and with potassium up to 6.0; increase insulin as below, continue IV fluids, give Lasix 40 mg IV today and retest potassium this afternoon.   -- Responsive to Lasix, repeat 5.1. Stop IVF.  Labs in AM ordered - 3/1: K+ reviewed and normalized   - 3/3: BMP with decreased sodium 129; has been slightly low around 132.  Otherwise, stable. 3/4: Na improved, K stable 3-6: BMP stable, continue current regimen  8.  Prader-Willi syndrome.  Patient lives with parents independent prior to admission   -Will get behavioral plan in place to assist with staff understanding patient's cognitive baseline and motivation; per mom, does well with food motivators and puzzles/coloring books.  Would use food sparingly given uncontrolled diabetes.  9.  Diabetes mellitus.  Hemoglobin A1c 9.5.  continue NovoLog 6 units 3 times daily, Semglee 55 units nightly.  Glycemia likely complicated by poor pain control   - home regimen per chart review endocrinology 06/22/23: Basal insulin: Continue Lantus to 50 units daily Prandial insulin: Continue Novolog 12 units with meals Correctional insulin: Continue Novolog 2: 50 > 150 with meals and the half dose before bed Non-insulin agents: - tirzepatide 5 mg weekly, increase as able (is currently finishing out their last ozempic pen before switching) - Continue metformin ER 1000 mg bid - Continue empagliflozin 25 mg daily    - Per mom, baseline blood sugar between 150 and 300    - Was switched to semglee 32 U BID yesterday; IV fluids today   2/28: Resume home metformin, hold Jardiance due to hyperkalemia, increase Lantus to 45 units twice  daily  Regimen adjusted as per diabetic coordinator recommendations-- 13U premeal + SSI + 50U lantis BID  3/3: BG remains elevated; increase lantus to 60 U BID, premeal to 15 U TID--improved this afternoon.  If potassium remains stable and sodium improved in a.m. labs, will resume Jardiance-- 3/4: K stable, ordered Jardiance 25 mg daily 3-5: Blood glucose down today, with reduction in pain and resumption of home medications concern for overtreatment; switch Premeal insulin to home 12 units 3 times daily, and long-acting Semglee to 30 units twice daily.  Okay to go a little high to avoid overnight lows, as she generally is lowest first thing in the morning.  Will titrate long-acting further  based on a.m. reads. 3/6: Blood sugars in normal range with adjusted dosing  3/7: Will transition back to home Semglee every morning only at 60 units 3-8 Recent Labs    09/09/23 2251 09/10/23 0616 09/10/23 1246  GLUCAP 106* 106* 100*     10.  Hypertension.  Continue Lisinopril 40 mg daily, Toprol-XL 25 mg daily    - normotensive    09/10/2023    5:29 AM 09/09/2023    7:46 PM 09/09/2023    3:40 PM  Vitals with BMI  Systolic 117 112 161  Diastolic 72 53 61  Pulse 78 87 87    11.  Morbid obesity.  BMI 44.88.  Dietary follow-up.  Complicated by significant food motivation due to Prader-Willi syndrome.   12.  Bilateral lower extremity edema.  Will get duplexes today; if negative, will get Ace wrap for left lower extremity and elevate in bed.  - 3/7: Patient received 1x dose Eliquis 10 mg before final duplex read as no DVT LLE; patient did not allow study to be done on right   - DC eliquis, resume prior Aspirin 325 mg daily 3/8   - Lasix 20 mg x1 for edema   - ACE wrap to LLE when OOB for swelling  LOS: 9 days A FACE TO FACE EVALUATION WAS PERFORMED  Angelina Sheriff 09/10/2023, 3:04 PM

## 2023-09-10 NOTE — Progress Notes (Signed)
 Physical Therapy Weekly Progress Note  Patient Details  Name: Linda Mason MRN: [id number] Date of Birth: [birth year]  Beginning of progress report period: September 02, 2023 End of progress report period: September 10, 2023  Today's Date: 09/10/2023 PT Individual Time: 5284-1324 + 4010-2725 PT Individual Time Calculation (min): 55 min + 77 min  Patient has met 3 of 3 short term goals.  Pt making great progress towards functional goals. Pt currently completes supine>sit with supervision, sit to supine with mod assist for RLE management. Pt completes sit<>stand with RW with CGA, completes bed to [facility] transfer with CGA. Pt ambulates up to 60' with RW with CGA. Education is ongoing with pt mother, however they would benefit from formal family education prior to DC.  Patient continues to demonstrate the following deficits muscle weakness, decreased cardiorespiratoy endurance, and decreased standing balance, decreased postural control, and decreased balance strategies and therefore will continue to benefit from skilled PT intervention to increase functional independence with mobility.  Patient progressing toward long term goals..  Continue plan of care.  PT Short Term Goals Week 1:  PT Short Term Goal 1 (Week 1): Pt will complete bed mobility with CGA PT Short Term Goal 1 - Progress (Week 1): Progressing toward goal PT Short Term Goal 2 (Week 1): Pt will complete transfer with LRAD with min assist PT Short Term Goal 2 - Progress (Week 1): Met PT Short Term Goal 3 (Week 1): Pt will ambulate 20' with min assist and 2nd person WC follow PT Short Term Goal 3 - Progress (Week 1): Met Week 2:  PT Short Term Goal 1 (Week 2): STG = LTG due to ELOS  Skilled Therapeutic Interventions/Progress Updates:  SESSION 1: Pt presents in room in bed, agreeable to PT however pt initially upset that she had not had a shower. Pt agreeable to PT with education on OT at 10am. Pt reporting 10/10 pain in R knee, RN  notified and present with medications during session. Session focused on gait training, WC mobility training, and therapeutic activities for therapeutic use of self, bed mobility, and transfer training.  Therapist dons knee immobilizer with pt in supine total assist, requires increased time due to pt reporting pain. Pt completes bed mobility with supervision, increased time due to pain. Pt completes stand step transfer to [facility] with CGA and increased time. Pt self propels WC to day room with supervision and able to back up into position with min verbal cues, navigating obstacles with min cues. Pt takes extended seated rest break, RN present for medication administration.  Pt ambulates with RW 20' with CGA, significantly increased time to complete due to pt reporting R knee pain, requires standing rest break every few steps but motivated to continue ambulating without encouragement. Pt takes extended seated rest break at end of gait trial, then pt self propels back to room.  Pt returns to room and handoff to NT as pt requesting to use restroom at end of session.   SESSION 2: Pt presents in room getting into bed with NT with pt initially not agreeable to PT due to fatigue but agreeable with encouragement. Pt reporting pain in R knee, requesting pain medication, RN notified. Session focused on therapeutic activities for bed mobility training, transfer training, WC mobility training, and therapeutic exercise from seated position to facilitate improved activity tolerance and participation with activities of enjoyment.   Pt completes supine to sit with supervision, elevated HOB, completes stand step transfer to [facility] with CGA. Pt self  propels WC to nurses station with supervision for pain medication administration. Pt then self propels to day room with supervision, maintains good speed with propulsion and able to position WC in front of TV without cues for therex.  Pt participates with UE therex with Wii bowling  and baseball for improving participation with therapeutic exercise as well as improving activity tolerance. Pt completes two rounds of bowling and one round of baseball, therapist assisting with facilitating increased ROM for BUE with games as well as promote crossbody movements for improved core recruitment.  Pt then self propels back to room with supervision ~300', no rest breaks and positions WC for transfer. Pt compeltes stand step transfer to bed with CGA, requesting topical pain relief cream on LLE with pt able to manage pants over hips to bilateral knees with CGA for postural stability prior to sitting on EOB. Therapist assist with doffing pants min assist, then pt requires mod assist for BLEs into bed. Pt positioned with LLE elevated, ice placed on L knee, and all needs within reach, call light in place, bed alarm activated, 4 bed rails up per pt request. RN notified that pt requesting topical cream and present for administration.      Therapy Documentation Precautions:  Precautions Precautions: Knee Precaution/Restrictions Comments: Prader-willi syndrome Required Braces or Orthoses: Knee Immobilizer - Right Knee Immobilizer - Right: On when out of bed or walking Restrictions Weight Bearing Restrictions Per Provider Order: Yes RLE Weight Bearing Per Provider Order: Weight bearing as tolerated   Therapy/Group: Individual Therapy  Edwin Cap PT, DPT 09/10/2023, 9:02 AM

## 2023-09-11 LAB — GLUCOSE, CAPILLARY
Glucose-Capillary: 160 mg/dL — ABNORMAL HIGH (ref 70–99)
Glucose-Capillary: 177 mg/dL — ABNORMAL HIGH (ref 70–99)
Glucose-Capillary: 124 mg/dL — ABNORMAL HIGH (ref 70–99)
Glucose-Capillary: 129 mg/dL — ABNORMAL HIGH (ref 70–99)
Glucose-Capillary: 164 mg/dL — ABNORMAL HIGH (ref 70–99)

## 2023-09-11 NOTE — Progress Notes (Signed)
 PROGRESS NOTE   Subjective/Complaints:  Pt reports pain Is "fine" father asleep -pt doesn't want me to wake him up-  Wants nursing ot help to bathroom.     ROS:  Pt denies SOB, abd pain, CP, N/V/C/D, and vision changes  + LLE swelling--ongoing + R leg itching--resolved  Objective:   No results found.  No results for input(s): "WBC", "HGB", "HCT", "PLT" in the last 72 hours.  Recent Labs    09/09/23 1139  NA 136  K 4.5  CL 101  CO2 24  GLUCOSE 108*  BUN 20  CREATININE 0.53  CALCIUM 8.7*    Intake/Output Summary (Last 24 hours) at 09/11/2023 1647 Last data filed at 09/11/2023 1300 Gross per 24 hour  Intake 474 ml  Output --  Net 474 ml        Physical Exam: Vital Signs Blood pressure (!) 140/80, pulse 81, temperature (!) 97.5 F (36.4 C), resp. rate 18, weight 107.2 kg, SpO2 92%.    General: awake, alert, appropriate, sitting up in w/c- eager to get to bathroom; adult female asleep in room;  NAD HENT: conjugate gaze; oropharynx moist CV: regular rate and rhythm; no JVD Pulmonary: CTA B/L; no W/R/R- good air movement GI: soft, NT, ND, (+)BS- protuberant Psychiatric: appropriate Neurological: Ox3 Extremities:   2+ bilateral lower extremity edema. + ttp L calf--unchanged  Skin: C/D/I. Mild swelling and erythema superior to medial patella; no obvious urticaria or rash--in extension brace--no changes    MSK:     Apparent deformities.  Right lower extremity extension brace donned  Neurologic exam:  Cognition: AAO to person, place, time and event.  Follows all simple commands. + Baseline higher-level cognitive deficits in insight and attention  Insight: Poor insight into current condition.  Mood: Pleasant affect, appropriate mood.  Sensation, reflexes, cranial nerves, coordination, and tone without apparent deficits.        Strength:                RUE: 5/5 SA, 5/5 EF, 5/5 EE, 5/5 WE, 5/5 FF, 5/5 FA                  LUE: 5/5 SA, 5/5 EF, 5/5 EE, 5/5 WE, 5/5 FF, 5/5 FA                 RLE: Limited due to extension brace; 2/5 HF, 4/5 DF, 4/5 PF                LLE:  4/5 HF, 4/5 KE, 5/5 DF, 5/5 EHL, 5/5 PF   Unchanged 3/7     Assessment/Plan: 1. Functional deficits which require 3+ hours per day of interdisciplinary therapy in a comprehensive inpatient rehab setting. Physiatrist is providing close team supervision and 24 hour management of active medical problems listed below. Physiatrist and rehab team continue to assess barriers to discharge/monitor patient progress toward functional and medical goals  Care Tool:  Bathing    Body parts bathed by patient: Right arm, Left arm, Chest, Abdomen   Body parts bathed by helper: Front perineal area, Buttocks, Right upper leg, Left upper leg, Right lower leg, Left lower leg Body parts n/a:  Right upper leg, Right lower leg   Bathing assist Assist Level: Maximal Assistance - Patient 24 - 49%     Upper Body Dressing/Undressing Upper body dressing   What is the patient wearing?: Pull over shirt    Upper body assist Assist Level: Minimal Assistance - Patient > 75%    Lower Body Dressing/Undressing Lower body dressing      What is the patient wearing?: Incontinence brief     Lower body assist Assist for lower body dressing: 2 Helpers     Toileting Toileting    Toileting assist Assist for toileting: Moderate Assistance - Patient 50 - 74%     Transfers Chair/bed transfer  Transfers assist     Chair/bed transfer assist level: Contact Guard/Touching assist     Locomotion Ambulation   Ambulation assist   Ambulation activity did not occur: Safety/medical concerns  Assist level: Contact Guard/Touching assist Assistive device: Walker-rolling Max distance: 62   Walk 10 feet activity   Assist  Walk 10 feet activity did not occur: Safety/medical concerns  Assist level: Contact Guard/Touching assist Assistive device:  Walker-rolling   Walk 50 feet activity   Assist Walk 50 feet with 2 turns activity did not occur: Safety/medical concerns  Assist level: Contact Guard/Touching assist Assistive device: Walker-rolling    Walk 150 feet activity   Assist Walk 150 feet activity did not occur: Safety/medical concerns         Walk 10 feet on uneven surface  activity   Assist Walk 10 feet on uneven surfaces activity did not occur: Safety/medical concerns         Wheelchair     Assist Is the patient using a wheelchair?: Yes Type of Wheelchair: Manual    Wheelchair assist level: Supervision/Verbal cueing Max wheelchair distance: 150    Wheelchair 50 feet with 2 turns activity    Assist        Assist Level: Supervision/Verbal cueing   Wheelchair 150 feet activity     Assist      Assist Level: Supervision/Verbal cueing   Blood pressure (!) 140/80, pulse 81, temperature (!) 97.5 F (36.4 C), resp. rate 18, weight 107.2 kg, SpO2 92%.  Medical Problem List and Plan: 1. Functional deficits secondary to right knee patellar dislocation.  Status post right knee arthroscopic assisted patellar reduction with lateral release.  Right knee arthroscopic loose body removal right knee open management of lateral femoral condyle and medial patella fracture 08/27/2023.  Weightbearing as tolerated.  Knee immobilizer when out of bed or when ambulating.             -patient may shower             -ELOS/Goals: 10-14 days MinA/S- 09/15/33              -Con't CIR    - 3/3: Will need WC and walker for home; OP therapies only. Making progress but limited by poor pain tolerance.    2.  Antithrombotics: -DVT/anticoagulation:  Mechanical: Antiembolism stockings, thigh (TED hose) Bilateral lower extremities.  Venous Dopplers negative             -antiplatelet therapy: continue Aspirin 325 mg daily x 2 weeks   3. Pain Management: continue Neurontin 100 mg 3 times daily Robaxin 500 mg 3 times daily,  Naprosyn 500 mg twice daily, oxycodone as needed  -2-27: Poor pain control interrupting sleep and very limiting for therapy progress.  Increase regimen to scheduled Tylenol 1000 mg 3 times daily, Robaxin  750 mg 3 times daily, and scheduled oxycodone 5 mg 3 times daily in addition to 5 mg every 4 hours as needed.  2-28: Improvement in pain control with the above measures, does well with premedicating therapies with PRNs.  Increase nightly gabapentin to 300 mg, can consider switching scheduled oxycodone to long-acting OxyContin 10 mg twice daily over the weekend if no improvement (discussed this with mother/therapies, patient will endorse severe pain regardless)  3/3: Using PRN oxycodone overnight consistently  3-5: Used decreasing to once daily; can likely reduce standing in 1 to 2 days  3-6: Last as needed used 3-4.  Will DC standing oxycodone  3-7: Voltaren to L hip QID. DC naprosyn 500 mg BID due to risk of bleeding and peripheral edema   3/8- said pain is controlled today- focused on bathroom 4. Mood/Behavior/Sleep: Provide emotional support             -antipsychotic agents: N/A  -2-27: Poor sleep due to right leg pain.  Pain control as above, add as needed melatonin  3-3: Sleep remains intermittently interrupted, able to go back to sleep with pain medication.--Resolved   5. Neuropsych/cognition: This patient is not capable of making decisions on her own behalf.   6. Skin/Wound Care/RASH new 2/28: Routine skin checks  -2-28: Nursing checking with mom regarding any prior medication reactions.  No recently started medications (noticed before AM lasix).  Continue as needed Benadryl.  3/3: Rash appears resolved  3/4: Itching from moisture in extension brace; remove when in bed--improved    3/7: Edema management on LLE with ACE wrap when OOB   7. Fluids/Electrolytes/Nutrition: Routine in and outs with follow-up chemistries    - K 5.9, Na 132, BUN 62 - IVF at 100 cc/hr 2/26  - 2-28:  BUN/creatinine improved with IV fluids, remains hyperglycemic and with potassium up to 6.0; increase insulin as below, continue IV fluids, give Lasix 40 mg IV today and retest potassium this afternoon.   -- Responsive to Lasix, repeat 5.1. Stop IVF.  Labs in AM ordered - 3/1: K+ reviewed and normalized   - 3/3: BMP with decreased sodium 129; has been slightly low around 132.  Otherwise, stable. 3/4: Na improved, K stable 3-6: BMP stable, continue current regimen  8.  Prader-Willi syndrome.  Patient lives with parents independent prior to admission   -Will get behavioral plan in place to assist with staff understanding patient's cognitive baseline and motivation; per mom, does well with food motivators and puzzles/coloring books.  Would use food sparingly given uncontrolled diabetes.   9.  Diabetes mellitus.  Hemoglobin A1c 9.5.  continue NovoLog 6 units 3 times daily, Semglee 55 units nightly.  Glycemia likely complicated by poor pain control   - home regimen per chart review endocrinology 06/22/23: Basal insulin: Continue Lantus to 50 units daily Prandial insulin: Continue Novolog 12 units with meals Correctional insulin: Continue Novolog 2: 50 > 150 with meals and the half dose before bed Non-insulin agents: - tirzepatide 5 mg weekly, increase as able (is currently finishing out their last ozempic pen before switching) - Continue metformin ER 1000 mg bid - Continue empagliflozin 25 mg daily    - Per mom, baseline blood sugar between 150 and 300    - Was switched to semglee 32 U BID yesterday; IV fluids today   2/28: Resume home metformin, hold Jardiance due to hyperkalemia, increase Lantus to 45 units twice daily  Regimen adjusted as per diabetic coordinator recommendations-- 13U premeal + SSI +  50U lantis BID  3/3: BG remains elevated; increase lantus to 60 U BID, premeal to 15 U TID--improved this afternoon.  If potassium remains stable and sodium improved in a.m. labs, will resume  Jardiance-- 3/4: K stable, ordered Jardiance 25 mg daily 3-5: Blood glucose down today, with reduction in pain and resumption of home medications concern for overtreatment; switch Premeal insulin to home 12 units 3 times daily, and long-acting Semglee to 30 units twice daily.  Okay to go a little high to avoid overnight lows, as she generally is lowest first thing in the morning.  Will titrate long-acting further based on a.m. reads. 3/6: Blood sugars in normal range with adjusted dosing  3/7: Will transition back to home Semglee every morning only at 60 units 3-8  3/8- better BG control- con't regimen for now Recent Labs    09/11/23 0603 09/11/23 0753 09/11/23 1147  GLUCAP 160* 177* 124*     10.  Hypertension.  Continue Lisinopril 40 mg daily, Toprol-XL 25 mg daily    - normotensive  3/8- Slightly elevated at 140/80 this AM- but overall controlled- con't regimen    09/11/2023    5:18 AM 09/10/2023    7:35 PM 09/10/2023    5:29 AM  Vitals with BMI  Systolic 140 99 117  Diastolic 80 61 72  Pulse 81 85 78    11.  Morbid obesity.  BMI 44.88.  Dietary follow-up.  Complicated by significant food motivation due to Prader-Willi syndrome.   12.  Bilateral lower extremity edema.  Will get duplexes today; if negative, will get Ace wrap for left lower extremity and elevate in bed.  - 3/7: Patient received 1x dose Eliquis 10 mg before final duplex read as no DVT LLE; patient did not allow study to be done on right   - DC eliquis, resume prior Aspirin 325 mg daily 3/8   - Lasix 20 mg x1 for edema   - ACE wrap to LLE when OOB for swelling  LOS: 10 days A FACE TO FACE EVALUATION WAS PERFORMED  Ugochukwu Chichester 09/11/2023, 4:47 PM

## 2023-09-12 LAB — GLUCOSE, CAPILLARY
Glucose-Capillary: 133 mg/dL — ABNORMAL HIGH (ref 70–99)
Glucose-Capillary: 161 mg/dL — ABNORMAL HIGH (ref 70–99)
Glucose-Capillary: 132 mg/dL — ABNORMAL HIGH (ref 70–99)
Glucose-Capillary: 147 mg/dL — ABNORMAL HIGH (ref 70–99)

## 2023-09-12 NOTE — Progress Notes (Signed)
 Pt requesting shower and gave pt education as to why we are unable to give shower considering pt safety and staffs. Offered bed bath and clothing change. Pt refusing all care at this time.

## 2023-09-12 NOTE — Progress Notes (Signed)
 Physical Therapy Session Note  Patient Details  Name: Linda Mason MRN: [id number] Date of Birth: 1996/02/24  Today's Date: 09/12/2023 PT Individual Time: 5284-1324 PT Individual Time Calculation (min): 51 min   Short Term Goals: Week 1:  PT Short Term Goal 1 (Week 1): Pt will complete bed mobility with CGA PT Short Term Goal 1 - Progress (Week 1): Progressing toward goal PT Short Term Goal 2 (Week 1): Pt will complete transfer with LRAD with min assist PT Short Term Goal 2 - Progress (Week 1): Met PT Short Term Goal 3 (Week 1): Pt will ambulate 20' with min assist and 2nd person WC follow PT Short Term Goal 3 - Progress (Week 1): Met Week 2:  PT Short Term Goal 1 (Week 2): STG = LTG due to ELOS  Skilled Therapeutic Interventions/Progress Updates:     Pt received sitting in wheelchair. Pt reports absent pain. PT introduced herself and pt asked for a shower. PT let her know what they would be working on and pt began to hysterically cry that she wanted a shower; PT offered to assist with a sinkside bath. PT notified nursing who stated they had already let her know that due to staffing shortage, a shower could not be performed today by them, that PT could not do a shower and that OT would do one on Monday. PT relayed the message to patient and offered to assist with a sink bath. Pt continued to cry and constantly rub her eyes; her father tried to talk with her and get her to calm down and explain she couldn't have a shower today and would need to wait until Monday. Pt did not calm and called her Mom. Her Mom tried to talk with her after the situation was explained but pt would not listen to her Mother. PT went and got nursing and asked them if they could come and explain to pt again the circumstances. Nursing came and attempted to talk to patient with pt getting more and more upset; nursing recommended that she and PT leave pt and give her some calming time of about 5 minutes. Dad agreed that  was a good idea. After 5 minutes, PT went back and stated she could give pt a sink bath with pt refusing.  Upon PT departure, PT happened to see rehab tech who states she has a good relationship with the patient. She spoke with the patient and calmed her down. PT went in after rehab tech and asked her if she wanted a sink bath and pt stated she would and she was more calm. Pt now agreeable to PT tx.  Session began at this time.  Theract: STS sinkside with PT initially min A decreasing to CGA for transfers x 6 to assist with dressing and sinkside bathing. Standing tolerance to perform hygiene at 4 min with PT SBA to dependency to assist with hygiene but pt able to sustain standing holding with 1 hand onto the RW and the other onto the sink. Upper body dressing S; lower body dressing moderate A. W/C mobility approx 202 ft with PT verbal cues for obstacle negotiation performed. Pt declined walking. Immobilizer donned for all (was on when PT arrived to room).  Pt left in room with seatbelt alarm on in w/c, no complaints of pain, Dad still present.   Therapy Documentation Precautions:  Precautions Precautions: Knee Precaution/Restrictions Comments: Prader-willi syndrome Required Braces or Orthoses: Knee Immobilizer - Right Knee Immobilizer - Right: On when out of bed  or walking Restrictions Weight Bearing Restrictions Per Provider Order: Yes RLE Weight Bearing Per Provider Order: Weight bearing as tolerated   Therapy/Group: Individual Therapy  Luna Fuse 09/12/2023, 6:27 AM

## 2023-09-12 NOTE — Progress Notes (Signed)
 Occupational Therapy Weekly Progress Note  Patient Details  Name: Linda Mason MRN: [id number] Date of Birth: 12-Jan-1996  Beginning of progress report period: September 02, 2023 End of progress report period: September 09, 2023  Session 1: {CHL IP REHAB OT TIME CALCULATIONS:304400400} Session 2: {CHL IP REHAB OT TIME CALCULATIONS:304400400}  Patient has met 2 of 3 short term goals.  As of ***  Patient continues to demonstrate the following deficits: muscle weakness and decreased standing balance, decreased postural control, decreased balance strategies, and difficulty maintaining precautions and therefore will continue to benefit from skilled OT intervention to enhance overall performance with BADL and Reduce care partner burden.  Patient progressing toward long term goals..  Continue plan of care.  OT Short Term Goals Week 1:  OT Short Term Goal 1 (Week 1): Pt will complete sit > stand in prep for ADL with min A using LRAD OT Short Term Goal 1 - Progress (Week 1): Met OT Short Term Goal 2 (Week 1): Pt will complete 1/3 toileting steps with CGA for balance OT Short Term Goal 2 - Progress (Week 1): Met OT Short Term Goal 3 (Week 1): Pt will complete toilet transfer with mod A using LRAD OT Short Term Goal 3 - Progress (Week 1): Progressing toward goal Week 2:  OT Short Term Goal 1 (Week 2): STG = LTG (d/t ELOS)  Skilled Therapeutic Interventions/Progress Updates:    Session 1: Completed family training with ***. Therapist educated family regarding ***. Education also provided on strategies and compensatory techniques to use if ***. Education provided for gait belt use and handling technique. *** completed hands on training for *** and *** transfer with therapist providing VC as needed for technique and form. All education completed and all questions   Session 2: Patient agreeable to participate in OT session. Reports *** pain level.   Patient participated in skilled OT session  focusing on ***. Therapist facilitated/assessed/developed/educated/integrated/elicited *** in order to improve/facilitate/promote    Therapy Documentation Precautions:  Precautions Precautions: Knee Precaution/Restrictions Comments: Prader-willi syndrome Required Braces or Orthoses: Knee Immobilizer - Right Knee Immobilizer - Right: On when out of bed or walking Restrictions Weight Bearing Restrictions Per Provider Order: Yes RLE Weight Bearing Per Provider Order: Weight bearing as tolerated   Therapy/Group: Individual Therapy  [name], OTR/L,CBIS  Supplemental OT - [facility] and [facility] Secure Chat Preferred   09/12/2023, 10:18 PM

## 2023-09-12 NOTE — Progress Notes (Signed)
 PROGRESS NOTE   Subjective/Complaints:  Pt focused on needing to go to bathroom again this AM.  And needs to shower- said had pain but took meds and is better.    ROS:   Pt denies SOB, abd pain, CP, N/V/C/D, and vision changes  + LLE swelling--ongoing + R leg itching--resolved  Objective:   No results found.  No results for input(s): "WBC", "HGB", "HCT", "PLT" in the last 72 hours.  Recent Labs    09/09/23 1139  NA 136  K 4.5  CL 101  CO2 24  GLUCOSE 108*  BUN 20  CREATININE 0.53  CALCIUM 8.7*    Intake/Output Summary (Last 24 hours) at 09/12/2023 1059 Last data filed at 09/12/2023 0800 Gross per 24 hour  Intake 921 ml  Output --  Net 921 ml        Physical Exam: Vital Signs Blood pressure (!) 116/59, pulse 91, temperature (!) 97.5 F (36.4 C), temperature source Oral, resp. rate 18, weight 107.2 kg, SpO2 93%.     General: awake, alert, appropriate, urgently wants to pee; NT in room; NAD HENT: conjugate gaze; oropharynx moist CV: regular rate and rhythm; no JVD Pulmonary: CTA B/L; no W/R/R- good air movement GI: soft, NT, ND, (+)BS Psychiatric: appropriate Neurological: alert Extremities:   1-2+ bilateral lower extremity edema. + ttp L calf--unchanged  Skin: C/D/I. Mild swelling and erythema superior to medial patella; no obvious urticaria or rash--in extension brace--no changes    MSK:     Apparent deformities.  Right lower extremity extension brace donned  Neurologic exam:  Cognition: AAO to person, place, time and event.  Follows all simple commands. + Baseline higher-level cognitive deficits in insight and attention  Insight: Poor insight into current condition.  Mood: Pleasant affect, appropriate mood.  Sensation, reflexes, cranial nerves, coordination, and tone without apparent deficits.        Strength:                RUE: 5/5 SA, 5/5 EF, 5/5 EE, 5/5 WE, 5/5 FF, 5/5 FA                  LUE: 5/5 SA, 5/5 EF, 5/5 EE, 5/5 WE, 5/5 FF, 5/5 FA                 RLE: Limited due to extension brace; 2/5 HF, 4/5 DF, 4/5 PF                LLE:  4/5 HF, 4/5 KE, 5/5 DF, 5/5 EHL, 5/5 PF   Unchanged 3/7     Assessment/Plan: 1. Functional deficits which require 3+ hours per day of interdisciplinary therapy in a comprehensive inpatient rehab setting. Physiatrist is providing close team supervision and 24 hour management of active medical problems listed below. Physiatrist and rehab team continue to assess barriers to discharge/monitor patient progress toward functional and medical goals  Care Tool:  Bathing    Body parts bathed by patient: Right arm, Left arm, Chest, Abdomen   Body parts bathed by helper: Front perineal area, Buttocks, Right upper leg, Left upper leg, Right lower leg, Left lower leg Body parts n/a: Right upper  leg, Right lower leg   Bathing assist Assist Level: Maximal Assistance - Patient 24 - 49%     Upper Body Dressing/Undressing Upper body dressing   What is the patient wearing?: Pull over shirt    Upper body assist Assist Level: Minimal Assistance - Patient > 75%    Lower Body Dressing/Undressing Lower body dressing      What is the patient wearing?: Incontinence brief     Lower body assist Assist for lower body dressing: 2 Helpers     Toileting Toileting    Toileting assist Assist for toileting: Moderate Assistance - Patient 50 - 74%     Transfers Chair/bed transfer  Transfers assist     Chair/bed transfer assist level: Contact Guard/Touching assist     Locomotion Ambulation   Ambulation assist   Ambulation activity did not occur: Safety/medical concerns  Assist level: Contact Guard/Touching assist Assistive device: Walker-rolling Max distance: 62   Walk 10 feet activity   Assist  Walk 10 feet activity did not occur: Safety/medical concerns  Assist level: Contact Guard/Touching assist Assistive device:  Walker-rolling   Walk 50 feet activity   Assist Walk 50 feet with 2 turns activity did not occur: Safety/medical concerns  Assist level: Contact Guard/Touching assist Assistive device: Walker-rolling    Walk 150 feet activity   Assist Walk 150 feet activity did not occur: Safety/medical concerns         Walk 10 feet on uneven surface  activity   Assist Walk 10 feet on uneven surfaces activity did not occur: Safety/medical concerns         Wheelchair     Assist Is the patient using a wheelchair?: Yes Type of Wheelchair: Manual    Wheelchair assist level: Supervision/Verbal cueing Max wheelchair distance: 150    Wheelchair 50 feet with 2 turns activity    Assist        Assist Level: Supervision/Verbal cueing   Wheelchair 150 feet activity     Assist      Assist Level: Supervision/Verbal cueing   Blood pressure (!) 116/59, pulse 91, temperature (!) 97.5 F (36.4 C), temperature source Oral, resp. rate 18, weight 107.2 kg, SpO2 93%.  Medical Problem List and Plan: 1. Functional deficits secondary to right knee patellar dislocation.  Status post right knee arthroscopic assisted patellar reduction with lateral release.  Right knee arthroscopic loose body removal right knee open management of lateral femoral condyle and medial patella fracture 08/27/2023.  Weightbearing as tolerated.  Knee immobilizer when out of bed or when ambulating.             -patient may shower             -ELOS/Goals: 10-14 days MinA/S- 09/15/33              Con't CIR    - 3/3: Will need WC and walker for home; OP therapies only. Making progress but limited by poor pain tolerance.    2.  Antithrombotics: -DVT/anticoagulation:  Mechanical: Antiembolism stockings, thigh (TED hose) Bilateral lower extremities.  Venous Dopplers negative             -antiplatelet therapy: continue Aspirin 325 mg daily x 2 weeks   3. Pain Management: continue Neurontin 100 mg 3 times daily  Robaxin 500 mg 3 times daily, Naprosyn 500 mg twice daily, oxycodone as needed  -2-27: Poor pain control interrupting sleep and very limiting for therapy progress.  Increase regimen to scheduled Tylenol 1000 mg 3 times daily,  Robaxin 750 mg 3 times daily, and scheduled oxycodone 5 mg 3 times daily in addition to 5 mg every 4 hours as needed.  2-28: Improvement in pain control with the above measures, does well with premedicating therapies with PRNs.  Increase nightly gabapentin to 300 mg, can consider switching scheduled oxycodone to long-acting OxyContin 10 mg twice daily over the weekend if no improvement (discussed this with mother/therapies, patient will endorse severe pain regardless)  3/3: Using PRN oxycodone overnight consistently  3-5: Used decreasing to once daily; can likely reduce standing in 1 to 2 days  3-6: Last as needed used 3-4.  Will DC standing oxycodone  3-7: Voltaren to L hip QID. DC naprosyn 500 mg BID due to risk of bleeding and peripheral edema   3/8-3/9- again today, - said pain is controlled today- focused on bathroom   4. Mood/Behavior/Sleep: Provide emotional support             -antipsychotic agents: N/A  -2-27: Poor sleep due to right leg pain.  Pain control as above, add as needed melatonin  3-3: Sleep remains intermittently interrupted, able to go back to sleep with pain medication.--Resolved   5. Neuropsych/cognition: This patient is not capable of making decisions on her own behalf.   6. Skin/Wound Care/RASH new 2/28: Routine skin checks  -2-28: Nursing checking with mom regarding any prior medication reactions.  No recently started medications (noticed before AM lasix).  Continue as needed Benadryl.  3/3: Rash appears resolved  3/4: Itching from moisture in extension brace; remove when in bed--improved    3/7: Edema management on LLE with ACE wrap when OOB   7. Fluids/Electrolytes/Nutrition: Routine in and outs with follow-up chemistries    - K 5.9, Na 132,  BUN 62 - IVF at 100 cc/hr 2/26  - 2-28: BUN/creatinine improved with IV fluids, remains hyperglycemic and with potassium up to 6.0; increase insulin as below, continue IV fluids, give Lasix 40 mg IV today and retest potassium this afternoon.   -- Responsive to Lasix, repeat 5.1. Stop IVF.  Labs in AM ordered - 3/1: K+ reviewed and normalized   - 3/3: BMP with decreased sodium 129; has been slightly low around 132.  Otherwise, stable. 3/4: Na improved, K stable 3-6: BMP stable, continue current regimen  8.  Prader-Willi syndrome.  Patient lives with parents independent prior to admission   -Will get behavioral plan in place to assist with staff understanding patient's cognitive baseline and motivation; per mom, does well with food motivators and puzzles/coloring books.  Would use food sparingly given uncontrolled diabetes.   9.  Diabetes mellitus.  Hemoglobin A1c 9.5.  continue NovoLog 6 units 3 times daily, Semglee 55 units nightly.  Glycemia likely complicated by poor pain control   - home regimen per chart review endocrinology 06/22/23: Basal insulin: Continue Lantus to 50 units daily Prandial insulin: Continue Novolog 12 units with meals Correctional insulin: Continue Novolog 2: 50 > 150 with meals and the half dose before bed Non-insulin agents: - tirzepatide 5 mg weekly, increase as able (is currently finishing out their last ozempic pen before switching) - Continue metformin ER 1000 mg bid - Continue empagliflozin 25 mg daily    - Per mom, baseline blood sugar between 150 and 300    - Was switched to semglee 32 U BID yesterday; IV fluids today   2/28: Resume home metformin, hold Jardiance due to hyperkalemia, increase Lantus to 45 units twice daily  Regimen adjusted as per diabetic  coordinator recommendations-- 13U premeal + SSI + 50U lantis BID  3/3: BG remains elevated; increase lantus to 60 U BID, premeal to 15 U TID--improved this afternoon.  If potassium remains stable and sodium  improved in a.m. labs, will resume Jardiance-- 3/4: K stable, ordered Jardiance 25 mg daily 3-5: Blood glucose down today, with reduction in pain and resumption of home medications concern for overtreatment; switch Premeal insulin to home 12 units 3 times daily, and long-acting Semglee to 30 units twice daily.  Okay to go a little high to avoid overnight lows, as she generally is lowest first thing in the morning.  Will titrate long-acting further based on a.m. reads. 3/6: Blood sugars in normal range with adjusted dosing  3/7: Will transition back to home Semglee every morning only at 60 units 3-8  3/8- better BG control- con't regimen for now Recent Labs    09/11/23 1649 09/11/23 2051 09/12/23 0552  GLUCAP 129* 164* 133*     10.  Hypertension.  Continue Lisinopril 40 mg daily, Toprol-XL 25 mg daily    - normotensive  3/8- Slightly elevated at 140/80 this AM- but overall controlled- con't regimen    09/12/2023    3:26 AM 09/11/2023    7:40 PM 09/11/2023    5:18 AM  Vitals with BMI  Systolic 116 123 409  Diastolic 59 79 80  Pulse 91 93 81    11.  Morbid obesity.  BMI 44.88.  Dietary follow-up.  Complicated by significant food motivation due to Prader-Willi syndrome.   12.  Bilateral lower extremity edema.  Will get duplexes today; if negative, will get Ace wrap for left lower extremity and elevate in bed.  - 3/7: Patient received 1x dose Eliquis 10 mg before final duplex read as no DVT LLE; patient did not allow study to be done on right   - DC eliquis, resume prior Aspirin 325 mg daily 3/8   - Lasix 20 mg x1 for edema   - ACE wrap to LLE when OOB for swelling  LOS: 11 days A FACE TO FACE EVALUATION WAS PERFORMED  Westlyn Glaza 09/12/2023, 10:59 AM

## 2023-09-13 LAB — GLUCOSE, CAPILLARY
Glucose-Capillary: 111 mg/dL — ABNORMAL HIGH (ref 70–99)
Glucose-Capillary: 163 mg/dL — ABNORMAL HIGH (ref 70–99)
Glucose-Capillary: 118 mg/dL — ABNORMAL HIGH (ref 70–99)
Glucose-Capillary: 152 mg/dL — ABNORMAL HIGH (ref 70–99)

## 2023-09-13 NOTE — Progress Notes (Signed)
 Occupational Therapy Session Note  Patient Details  Name: Linda Mason [facility] MRN: [id number] Date of Birth: July 06, 1996  Session 1: {CHL IP REHAB OT TIME CALCULATIONS:304400400} Session 2:  {CHL IP REHAB OT TIME CALCULATIONS:304400400}  Short Term Goals: Week 2:  OT Short Term Goal 1 (Week 2): STG = LTG (d/t ELOS)  Skilled Therapeutic Interventions/Progress Updates:    Session 1: Patient agreeable to participate in OT session. Reports *** pain level.   Patient participated in skilled OT session focusing on ***. Therapist facilitated/assessed/developed/educated/integrated/elicited *** in order to improve/facilitate/promote   Session 2: Patient agreeable to participate in OT session. Reports *** pain level.   Patient participated in skilled OT session focusing on ***. Therapist facilitated/assessed/developed/educated/integrated/elicited *** in order to improve/facilitate/promote    Therapy Documentation Precautions:  Precautions Precautions: Knee Precaution/Restrictions Comments: Prader-willi syndrome Required Braces or Orthoses: Knee Immobilizer - Right Knee Immobilizer - Right: On when out of bed or walking Restrictions Weight Bearing Restrictions Per Provider Order: Yes RLE Weight Bearing Per Provider Order: Weight bearing as tolerated    Therapy/Group: Individual Therapy  [name], OTR/L,CBIS  Supplemental OT - [facility] and [facility] Secure Chat Preferred   09/13/2023, 11:43 PM

## 2023-09-13 NOTE — Progress Notes (Signed)
 Physical Therapy Session Note  Patient Details  Name: Linda Mason [facility] MRN: [id number] Date of Birth: 1996/06/14  Today's Date: 09/13/2023 PT Individual Time: 1103-1200 + 9811-9147 PT Individual Time Calculation (min): 57 min + 40 min  Short Term Goals: Week 2:  PT Short Term Goal 1 (Week 2): STG = LTG due to ELOS  Skilled Therapeutic Interventions/Progress Updates:    SESSION 1: Pt presents in room in [facility], agreeable to PT, denies pain at start of session but reporting pain with mobility during session. Session focused on family education with pt mother for [facility] mobility and parts management, car transfers, and gait training.  Pt self propels in [facility] with supervision navigating narrow doorway with min cues to protect fingers on L side, positions self for transfer to car. Pt with increased reluctance to complete car transfer requiring increased time to initiate but agreeable with time. Therapist demonstrates positioning across seats to allow for RLE management into car with knee immobilizer. Pt completes transfer to sitting on car with CGA with RW. Pt attempts to scoot posteriorly across seats, demonstrates difficulty completing due to intolerance to RLE movement, insufficient BUE and core strength. Therapist places 6" step beneath pt feet to provide improved leverage for task with pt continuing to requiring increased time, mod assist to bring LLE into car. Pt able to scoot posterior enough to allow RLE to swing into floorboard, however this therapist unsure if this method would be appropriate for pt's family's personal vehicle and pt mother educated on this, agreeable to bring in personal vehicle to trial tomorrow. Pt able to complete transfer out of car with min assist for RLE management and pt completes sit to stand from car to RW with supervision, completes ambulatory transfer back to [facility].  Pt self propels WC ~250' slowly but with supervision to day room. Pt mother educated on insurance covering  [facility] but will require personal purchase of RW with pt mother verbalizing understanding. Pt mother then educated on [facility] parts management for leg rests with pt mother demonstrating understanding.  Pt then ambulates with RW with CGA 20' with two turns, demonstrating antalgic gait with R stance phase, completes gait slowly. Pt returns to sitting in WC.  Pt returns to room and remains seated in [facility] with all needs within reach, cal light in place and chair alarm donned and activated at end of session.    SESSION 2: Pt presents in room in [facility], agreeable to PT. Pt denies pain at this time. Session focused on WC mobility, gait training, and NMR for dynamic standing balance, upright tolerance, reaching outside BOS and cognitive dual task.  Pt self propels WC 250' with supervision, cues for navigating people in hallway with pt distracted with passersby. Pt requires assist for positioning once in gym to prepare for gait training, requires assist for managing WC parts.  Pt completes gait around 3 bowling pins with RW to promote improved obstacle negotiation, attendance to task, and RW mgmt with pt demonstrating ambulating ~30' total, requires increased time, CGA, and cues for attending to task however demonstrates good RW mgmt and obstacle negotiation.  Pt takes seated rest break, then completes NMR for standing balance for reaching outside BOS with unilateral UE support, standing tolerance, and cognitive dual task with pt playing stand up connect 4 game. Pt maintains standing reaching outside BOS with LUE to L and across midline for placing game pieces, RUE support on RW. Therapist provides pt with cues for cognitive task to attend to game goals.  Pt completes 2 trials, 2:15 minutes for first trial, 1:34min for second trial with seated rest break between. During rest breaks pt completes reaching forward to L outside BOS for placing game pieces on correct peg to promote improved core strengthening and seated balance  needed for functional transfers.  Pt returns to room, self propels WC with supervision 250' improved speed however increased distractibility. Pt remains seated in WC with all needs within reach, cal light in place at end of session.     Therapy Documentation Precautions:  Precautions Precautions: Knee Precaution/Restrictions Comments: Prader-willi syndrome Required Braces or Orthoses: Knee Immobilizer - Right Knee Immobilizer - Right: On when out of bed or walking Restrictions Weight Bearing Restrictions Per Provider Order: Yes RLE Weight Bearing Per Provider Order: Weight bearing as tolerated   Therapy/Group: Individual Therapy  Edwin Cap PT, DPT 09/13/2023, 12:23 PM

## 2023-09-13 NOTE — Plan of Care (Signed)
  Problem: Consults Goal: RH GENERAL PATIENT EDUCATION Description: See Patient Education module for education specifics. Outcome: Progressing   Problem: RH BOWEL ELIMINATION Goal: RH STG MANAGE BOWEL WITH ASSISTANCE Description: STG Manage Bowel with minimal Assistance. Outcome: Progressing Goal: RH STG MANAGE BOWEL W/MEDICATION W/ASSISTANCE Description: STG Manage Bowel with Medication with Assistance. Outcome: Progressing   Problem: RH BLADDER ELIMINATION Goal: RH STG MANAGE BLADDER WITH ASSISTANCE Description: STG Manage Bladder With  minimal Assistance Outcome: Progressing   Problem: RH SKIN INTEGRITY Goal: RH STG SKIN FREE OF INFECTION/BREAKDOWN Description: Manage skin free of infection/ breakdown with minimal assistance Outcome: Progressing   Problem: RH SAFETY Goal: RH STG ADHERE TO SAFETY PRECAUTIONS W/ASSISTANCE/DEVICE Description: STG Adhere to Safety Precautions minimal With Assistance/Device. Outcome: Progressing   Problem: RH PAIN MANAGEMENT Goal: RH STG PAIN MANAGED AT OR BELOW PT'S PAIN GOAL Description: <4w/ prns Outcome: Progressing   Problem: RH KNOWLEDGE DEFICIT GENERAL Goal: RH STG INCREASE KNOWLEDGE OF SELF CARE AFTER HOSPITALIZATION Description: Manage increase knowledge of self care after with minimal assistance Outcome: Progressing

## 2023-09-13 NOTE — Progress Notes (Signed)
 PROGRESS NOTE   Subjective/Complaints:  No acute complaints.  No events overnight. ROS:   Pt denies SOB, abd pain, CP, N/V/C/D, and vision changes  + LLE swelling--ongoing + R leg itching--resolved  Objective:   No results found.  No results for input(s): "WBC", "HGB", "HCT", "PLT" in the last 72 hours.  No results for input(s): "NA", "K", "CL", "CO2", "GLUCOSE", "BUN", "CREATININE", "CALCIUM" in the last 72 hours.   Intake/Output Summary (Last 24 hours) at 09/13/2023 0756 Last data filed at 09/12/2023 1345 Gross per 24 hour  Intake 630 ml  Output --  Net 630 ml        Physical Exam: Vital Signs Blood pressure 126/77, pulse 85, temperature 97.7 F (36.5 C), temperature source Oral, resp. rate 16, weight 107.2 kg, SpO2 95%.     General: awake, alert, appropriate, urgently wants to pee; NT in room; NAD HENT: conjugate gaze; oropharynx moist CV: regular rate and rhythm; no JVD Pulmonary: CTA B/L; no W/R/R- good air movement GI: soft, NT, ND, (+)BS Psychiatric: appropriate Neurological: alert Extremities:   1-2+ bilateral lower extremity edema. + ttp L calf--unchanged  Skin: C/D/I. Mild swelling and erythema superior to medial patella; no obvious urticaria or rash--in extension brace--no changes    MSK:     Apparent deformities.  Right lower extremity extension brace donned  Neurologic exam:  Cognition: AAO to person, place, time and event.  Follows all simple commands. + Baseline higher-level cognitive deficits in insight and attention  Insight: Poor insight into current condition.  Mood: Pleasant affect, appropriate mood.  Sensation, reflexes, cranial nerves, coordination, and tone without apparent deficits.        Strength:                RUE: 5/5 SA, 5/5 EF, 5/5 EE, 5/5 WE, 5/5 FF, 5/5 FA                 LUE: 5/5 SA, 5/5 EF, 5/5 EE, 5/5 WE, 5/5 FF, 5/5 FA                 RLE: Limited due to  extension brace; 2/5 HF, 4/5 DF, 4/5 PF                LLE:  4/5 HF, 4/5 KE, 5/5 DF, 5/5 EHL, 5/5 PF   Unchanged 3/7     Assessment/Plan: 1. Functional deficits which require 3+ hours per day of interdisciplinary therapy in a comprehensive inpatient rehab setting. Physiatrist is providing close team supervision and 24 hour management of active medical problems listed below. Physiatrist and rehab team continue to assess barriers to discharge/monitor patient progress toward functional and medical goals  Care Tool:  Bathing    Body parts bathed by patient: Right arm, Left arm, Chest, Abdomen   Body parts bathed by helper: Front perineal area, Buttocks, Right upper leg, Left upper leg, Right lower leg, Left lower leg Body parts n/a: Right upper leg, Right lower leg   Bathing assist Assist Level: Maximal Assistance - Patient 24 - 49%     Upper Body Dressing/Undressing Upper body dressing   What is the patient wearing?: Pull over shirt  Upper body assist Assist Level: Minimal Assistance - Patient > 75%    Lower Body Dressing/Undressing Lower body dressing      What is the patient wearing?: Incontinence brief     Lower body assist Assist for lower body dressing: 2 Helpers     Toileting Toileting    Toileting assist Assist for toileting: Moderate Assistance - Patient 50 - 74%     Transfers Chair/bed transfer  Transfers assist     Chair/bed transfer assist level: Contact Guard/Touching assist     Locomotion Ambulation   Ambulation assist   Ambulation activity did not occur: Safety/medical concerns  Assist level: Contact Guard/Touching assist Assistive device: Walker-rolling Max distance: 62   Walk 10 feet activity   Assist  Walk 10 feet activity did not occur: Safety/medical concerns  Assist level: Contact Guard/Touching assist Assistive device: Walker-rolling   Walk 50 feet activity   Assist Walk 50 feet with 2 turns activity did not occur:  Safety/medical concerns  Assist level: Contact Guard/Touching assist Assistive device: Walker-rolling    Walk 150 feet activity   Assist Walk 150 feet activity did not occur: Safety/medical concerns         Walk 10 feet on uneven surface  activity   Assist Walk 10 feet on uneven surfaces activity did not occur: Safety/medical concerns         Wheelchair     Assist Is the patient using a wheelchair?: Yes Type of Wheelchair: Manual    Wheelchair assist level: Supervision/Verbal cueing Max wheelchair distance: 150    Wheelchair 50 feet with 2 turns activity    Assist        Assist Level: Supervision/Verbal cueing   Wheelchair 150 feet activity     Assist      Assist Level: Supervision/Verbal cueing   Blood pressure 126/77, pulse 85, temperature 97.7 F (36.5 C), temperature source Oral, resp. rate 16, weight 107.2 kg, SpO2 95%.  Medical Problem List and Plan: 1. Functional deficits secondary to right knee patellar dislocation.  Status post right knee arthroscopic assisted patellar reduction with lateral release.  Right knee arthroscopic loose body removal right knee open management of lateral femoral condyle and medial patella fracture 08/27/2023.  Weightbearing as tolerated.  Knee immobilizer when out of bed or when ambulating.             -patient may shower             -ELOS/Goals: 10-14 days MinA/S- 09/15/33              Con't CIR    - 3/3: Will need WC and walker for home; OP therapies only. Making progress but limited by poor pain tolerance.    2.  Antithrombotics: -DVT/anticoagulation:  Mechanical: Antiembolism stockings, thigh (TED hose) Bilateral lower extremities.  Venous Dopplers negative             -antiplatelet therapy: continue Aspirin 325 mg daily x 2 weeks   3. Pain Management: continue Neurontin 100 mg 3 times daily Robaxin 500 mg 3 times daily, Naprosyn 500 mg twice daily, oxycodone as needed  -2-27: Poor pain control  interrupting sleep and very limiting for therapy progress.  Increase regimen to scheduled Tylenol 1000 mg 3 times daily, Robaxin 750 mg 3 times daily, and scheduled oxycodone 5 mg 3 times daily in addition to 5 mg every 4 hours as needed.  2-28: Improvement in pain control with the above measures, does well with premedicating therapies with PRNs.  Increase nightly gabapentin to 300 mg, can consider switching scheduled oxycodone to long-acting OxyContin 10 mg twice daily over the weekend if no improvement (discussed this with mother/therapies, patient will endorse severe pain regardless)  3/3: Using PRN oxycodone overnight consistently  3-5: Used decreasing to once daily; can likely reduce standing in 1 to 2 days  3-6: Last as needed used 3-4.  Will DC standing oxycodone  3-7: Voltaren to L hip QID. DC naprosyn 500 mg BID due to risk of bleeding and peripheral edema   3/8-3/9- again today, - said pain is controlled today- focused on bathroom   4. Mood/Behavior/Sleep: Provide emotional support             -antipsychotic agents: N/A  -2-27: Poor sleep due to right leg pain.  Pain control as above, add as needed melatonin  3-3: Sleep remains intermittently interrupted, able to go back to sleep with pain medication.--Resolved   5. Neuropsych/cognition: This patient is not capable of making decisions on her own behalf.   6. Skin/Wound Care/RASH new 2/28: Routine skin checks  -2-28: Nursing checking with mom regarding any prior medication reactions.  No recently started medications (noticed before AM lasix).  Continue as needed Benadryl.  3/3: Rash appears resolved  3/4: Itching from moisture in extension brace; remove when in bed--improved    3/7: Edema management on LLE with ACE wrap when OOB   7. Fluids/Electrolytes/Nutrition: Routine in and outs with follow-up chemistries    - K 5.9, Na 132, BUN 62 - IVF at 100 cc/hr 2/26  - 2-28: BUN/creatinine improved with IV fluids, remains hyperglycemic and  with potassium up to 6.0; increase insulin as below, continue IV fluids, give Lasix 40 mg IV today and retest potassium this afternoon.   -- Responsive to Lasix, repeat 5.1. Stop IVF.  Labs in AM ordered - 3/1: K+ reviewed and normalized   - 3/3: BMP with decreased sodium 129; has been slightly low around 132.  Otherwise, stable. 3/4: Na improved, K stable 3-6: BMP stable, continue current regimen  8.  Prader-Willi syndrome.  Patient lives with parents independent prior to admission   -Will get behavioral plan in place to assist with staff understanding patient's cognitive baseline and motivation; per mom, does well with food motivators and puzzles/coloring books.  Would use food sparingly given uncontrolled diabetes.   9.  Diabetes mellitus.  Hemoglobin A1c 9.5.  continue NovoLog 6 units 3 times daily, Semglee 55 units nightly.  Glycemia likely complicated by poor pain control   - home regimen per chart review endocrinology 06/22/23: Basal insulin: Continue Lantus to 50 units daily Prandial insulin: Continue Novolog 12 units with meals Correctional insulin: Continue Novolog 2: 50 > 150 with meals and the half dose before bed Non-insulin agents: - tirzepatide 5 mg weekly, increase as able (is currently finishing out their last ozempic pen before switching) - Continue metformin ER 1000 mg bid - Continue empagliflozin 25 mg daily    - Per mom, baseline blood sugar between 150 and 300    - Was switched to semglee 32 U BID yesterday; IV fluids today   2/28: Resume home metformin, hold Jardiance due to hyperkalemia, increase Lantus to 45 units twice daily  Regimen adjusted as per diabetic coordinator recommendations-- 13U premeal + SSI + 50U lantis BID  3/3: BG remains elevated; increase lantus to 60 U BID, premeal to 15 U TID--improved this afternoon.  If potassium remains stable and sodium improved in a.m. labs, will resume Jardiance--  3/4: K stable, ordered Jardiance 25 mg daily 3-5: Blood  glucose down today, with reduction in pain and resumption of home medications concern for overtreatment; switch Premeal insulin to home 12 units 3 times daily, and long-acting Semglee to 30 units twice daily.  Okay to go a little high to avoid overnight lows, as she generally is lowest first thing in the morning.  Will titrate long-acting further based on a.m. reads. 3/6: Blood sugars in normal range with adjusted dosing  3/7: Will transition back to home Semglee every morning only at 60 units 3-8  3/8-10 better BG control- con't regimen for now Recent Labs    09/12/23 1621 09/12/23 2052 09/13/23 0604  GLUCAP 132* 147* 111*     10.  Hypertension.  Continue Lisinopril 40 mg daily, Toprol-XL 25 mg daily    - normotensive  3/8- Slightly elevated at 140/80 this AM- but overall controlled- con't regimen    09/13/2023    6:06 AM 09/12/2023    7:21 PM 09/12/2023   11:41 AM  Vitals with BMI  Systolic 126 114 782  Diastolic 77 79 81  Pulse 85 86 81    11.  Morbid obesity.  BMI 44.88.  Dietary follow-up.  Complicated by significant food motivation due to Prader-Willi syndrome.   12.  Bilateral lower extremity edema.  Will get duplexes today; if negative, will get Ace wrap for left lower extremity and elevate in bed.  - 3/7: Patient received 1x dose Eliquis 10 mg before final duplex read as no DVT LLE; patient did not allow study to be done on right   - DC eliquis, resume prior Aspirin 325 mg daily 3/8   - Lasix 20 mg x1 for edema   - ACE wrap to LLE when OOB for swelling  LOS: 12 days A FACE TO FACE EVALUATION WAS PERFORMED  Angelina Sheriff 09/13/2023, 7:56 AM

## 2023-09-14 LAB — GLUCOSE, CAPILLARY
Glucose-Capillary: 130 mg/dL — ABNORMAL HIGH (ref 70–99)
Glucose-Capillary: 169 mg/dL — ABNORMAL HIGH (ref 70–99)
Glucose-Capillary: 174 mg/dL — ABNORMAL HIGH (ref 70–99)
Glucose-Capillary: 102 mg/dL — ABNORMAL HIGH (ref 70–99)

## 2023-09-14 MED ORDER — ADULT MULTIVITAMIN W/MINERALS CH: 1.0000 | ORAL_TABLET | Freq: Every day | ORAL | Status: AC

## 2023-09-14 MED ORDER — ACETAMINOPHEN 325 MG PO TABS: 650.0000 mg | ORAL_TABLET | Freq: Four times a day (QID) | ORAL | Status: AC | PRN

## 2023-09-14 MED ORDER — TRAMADOL HCL 50 MG PO TABS: 50.0000 mg | ORAL_TABLET | Freq: Four times a day (QID) | ORAL | Status: DC | PRN

## 2023-09-14 MED ORDER — OXYCODONE HCL 5 MG PO TABS
5.0000 mg | ORAL_TABLET | Freq: Four times a day (QID) | ORAL | Status: DC | PRN
  Administered 2023-09-14 – 2023-09-15 (×4): 5 mg via ORAL
  Filled 2023-09-14 (×4): qty 1

## 2023-09-14 NOTE — Progress Notes (Signed)
 Occupational Therapy Discharge Summary  Patient Details  Name: Chakia Counts MRN: [id number] Date of Birth: 10/09/1995  Date of Discharge from OT service:{Time; dates multiple:304500300}  {CHL IP REHAB OT TIME CALCULATIONS:304400400}   Patient has met {NUMBERS 0-12:18577} of 5 long term goals due to {due WN:0272536}.  Patient to discharge at overall {LOA:3049010} level.  Patient's care partner {care partner:3041650} to provide the necessary {assistance:3041652} assistance at discharge.    Reasons goals not met: ***  Recommendation:  Patient will benefit from ongoing skilled OT services in outpatient setting to continue to advance functional skills in the area of BADL and Reduce care partner burden.  Equipment: BSC, manual WC, youth RW  Reasons for discharge: treatment goals met and discharge from hospital  Patient/family agrees with progress made and goals achieved: Yes  OT Discharge Precautions/Restrictions  Precautions Precautions: Knee Precaution Booklet Issued: No Recall of Precautions/Restrictions: Impaired Precaution/Restrictions Comments: Prader-willi syndrome Required Braces or Orthoses: Knee Immobilizer - Right Knee Immobilizer - Right: On when out of bed or walking Restrictions Weight Bearing Restrictions Per Provider Order: Yes RLE Weight Bearing Per Provider Order: Weight bearing as tolerated   ADL ADL Eating: Independent Where Assessed-Eating: Wheelchair Grooming: Setup Where Assessed-Grooming: Sitting at sink Upper Body Bathing: Supervision/safety Where Assessed-Upper Body Bathing: Shower Lower Body Bathing: Moderate assistance Where Assessed-Lower Body Bathing: Shower Upper Body Dressing: Setup Where Assessed-Upper Body Dressing: Wheelchair Lower Body Dressing: Maximal assistance Where Assessed-Lower Body Dressing: Wheelchair Toileting: Minimal assistance Where Assessed-Toileting: Bedside Commode, Teacher, adult education: Curator Method: Surveyor, minerals: Gaffer: Not assessed Tub/Shower Transfer Method: Unable to assess Film/video editor: Administrator, arts Method: Ambulating Vision Baseline Vision/History: 0 No visual deficits Patient Visual Report: No change from baseline Vision Assessment?: No apparent visual deficits Perception  Perception: Within Functional Limits Praxis Praxis: Impaired Praxis Impairment Details: Initiation;Perseveration;Motor planning Cognition Cognition Overall Cognitive Status: History of cognitive impairments - at baseline Arousal/Alertness: Awake/alert Orientation Level: Person;Place;Situation Person: Oriented Place: Oriented Situation: Oriented Memory: Impaired Awareness: Impaired Problem Solving: Impaired Safety/Judgment: Impaired Sensation Sensation Light Touch: Impaired by gross assessment Hot/Cold: Appears Intact Proprioception: Appears Intact Stereognosis: Not tested Additional Comments: RLE impaired Coordination Gross Motor Movements are Fluid and Coordinated: Yes Fine Motor Movements are Fluid and Coordinated: No Finger Nose Finger Test: NT 9 Hole Peg Test: NT Motor  Motor Motor: Other (comment);Abnormal postural alignment and control Motor - Skilled Clinical Observations: mobility limited due to R knee immobilizer and RLE pain Mobility  Transfers Sit to Stand: Contact Guard/Touching assist Stand to Sit: Contact Guard/Touching assist  Trunk/Postural Assessment  Cervical Assessment Cervical Assessment: Exceptions to [facility] (forward head) Thoracic Assessment Thoracic Assessment: Exceptions to [facility] (rounded shoulders) Lumbar Assessment Lumbar Assessment: Exceptions to [facility] (posterior pelvic tilt) Postural Control Postural Control: Deficits on evaluation Righting Reactions: delayed and inadequate Protective Responses: decreased Postural Limitations: decreased   Balance Balance Balance Assessed: Yes Static Sitting Balance Static Sitting - Balance Support: Feet supported;No upper extremity supported Static Sitting - Level of Assistance: 5: Stand by assistance Dynamic Sitting Balance Dynamic Sitting - Balance Support: Feet supported;Bilateral upper extremity supported;During functional activity Dynamic Sitting - Level of Assistance: 5: Stand by assistance Dynamic Sitting - Balance Activities: [employer] toss;Forward lean/weight shifting;Lateral lean/weight shifting Static Standing Balance Static Standing - Balance Support: Bilateral upper extremity supported;During functional activity Static Standing - Level of Assistance: 5: Stand by assistance Extremity/Trunk Assessment RUE Assessment RUE Assessment: Exceptions to [facility] Active Range of Motion (AROM)  Comments: Baseline limited shoulder flexion to 90 degrees, distally [facility] General Strength Comments: 4/5 grossly LUE Assessment LUE Assessment: Exceptions to [facility] Active Range of Motion (AROM) Comments: Baseline limited shoulder flexion to 90 degrees, distally [facility] General Strength Comments: 4/5 grossly   Bambi Fehnel, Charisse March 09/14/2023, 4:13 PM

## 2023-09-14 NOTE — Plan of Care (Signed)
  Problem: Consults Goal: RH GENERAL PATIENT EDUCATION Description: See Patient Education module for education specifics. Outcome: Progressing   Problem: RH BOWEL ELIMINATION Goal: RH STG MANAGE BOWEL WITH ASSISTANCE Description: STG Manage Bowel with minimal Assistance. Outcome: Progressing Goal: RH STG MANAGE BOWEL W/MEDICATION W/ASSISTANCE Description: STG Manage Bowel with Medication with Assistance. Outcome: Progressing   Problem: RH BLADDER ELIMINATION Goal: RH STG MANAGE BLADDER WITH ASSISTANCE Description: STG Manage Bladder With  minimal Assistance Outcome: Progressing   Problem: RH SKIN INTEGRITY Goal: RH STG SKIN FREE OF INFECTION/BREAKDOWN Description: Manage skin free of infection/ breakdown with minimal assistance Outcome: Progressing   Problem: RH SAFETY Goal: RH STG ADHERE TO SAFETY PRECAUTIONS W/ASSISTANCE/DEVICE Description: STG Adhere to Safety Precautions minimal With Assistance/Device. Outcome: Progressing   Problem: RH PAIN MANAGEMENT Goal: RH STG PAIN MANAGED AT OR BELOW PT'S PAIN GOAL Description: <4w/ prns Outcome: Progressing   Problem: RH KNOWLEDGE DEFICIT GENERAL Goal: RH STG INCREASE KNOWLEDGE OF SELF CARE AFTER HOSPITALIZATION Description: Manage increase knowledge of self care after with minimal assistance Outcome: Progressing

## 2023-09-14 NOTE — Patient Care Conference (Signed)
 Inpatient RehabilitationTeam Conference and Plan of Care Update Date: 09/14/2023   Time: 1054 am    Patient Name: Linda Mason      Medical Record Number: [id number]  Date of Birth: 1996-04-25 Sex: Female         Room/Bed: 4M13C/4M13C-01 Payor Info: Payor: [employer] / Plan: [employer] / Product Type: *No Product type* /    Admit Date/Time:  09/01/2023  2:51 PM  Primary Diagnosis:  Closed patellar dislocation, right, initial encounter  Mason Problems: Principal Problem:   Closed patellar dislocation, right, initial encounter    Expected Discharge Date: Expected Discharge Date: 09/16/23  Team Members Present: Physician leading conference: Dr. Elijah Birk Social Worker Present: Cecile Sheerer, LCSWA Nurse Present: Konrad Dolores, RN PT Present: Darrold Span, PT OT Present: [name], OT SLP Present: Other (comment) Kristeen Mans) PPS Coordinator present : Fae Pippin, SLP     Current Status/Progress Goal Weekly Team Focus  Bowel/Bladder   patient is continent of b/b   maintain continence   offer toileting qshift and PRN    Swallow/Nutrition/ Hydration               ADL's   Set-up grooming/UB dressing, Max A LB dressing (due to KI, socks, and incontinent brief), Mod A bathing, CGA step pivot t/f with RW   Min A   Increase bathing and LB dressing, family training has been completed    Mobility   minA bed mobility, stand pivot transfers with RW CGA, and gait with RW CGA up to 60'   supervision for transfers/ambulation, bed mobility and car transfers minA  barriers: behavior, pain; focus on gait training, weightbearing tolerance, standing tolerance    Communication                Safety/Cognition/ Behavioral Observations               Pain   right knee pain from dislocated patellar 7/10. PRN 5mg  Oxy relieves pain   <3/10 pain   assess pain qshift and PRN    Skin   incision with sutures right  knee   keep incision free from infection  assess incision qshift and PRN for s/s of infection      Discharge Planning:  Pt will discharge to home with her mother providing 24/7 care. Mother would like for her daughter to be as independent as possible. Pt only eligible for outpatient therapies as no [facility] is in network with insurance. SW will confirm there are no barriers to discharge.   Team Discussion: Patient admitted post fall with right knee patellar dislocation with right knee arthroscopic assisted patellar reduction. Patient has left leg pain and edema medications adjusted by MD. Patient limited by behavioral and cognitive deficits.  Patient on target to meet rehab goals: yes, Patient requires set-up assist with UB care. Patient requires Max A LB care due to Knee Immobilizer. Patient requires Mod A with  bathing. Patient requires CGA with transfers using a RW. Patient able to ambulate up to 85' CGA using a RW. Overall goals for discharge set at minimal assistance -supervision.    *See Care Plan and progress notes for long and short-term goals.   Revisions to Treatment Plan:  Behavior plan Car simulator Ace wrap for left leg Doppler study for left leg  Teaching Needs: Safety, medications, transfers, toileting, dietary modifications, etc.   Current Barriers to Discharge: Decreased caregiver support, Weight, Weight bearing restrictions, and Behavior  Possible Resolutions  to Barriers: Family Education Outpatient follow-up Wheel[facility] [employer] / Medical transport pending     Medical Summary Current Status: Medically complicated by behavioral/cognitive deficits, poor pain control, hyperphasia with diabetes, intermittent hyperkalemia, left lower extremity edema, and sleep disruption  Barriers to Discharge: Behavior/Mood;Electrolyte abnormality;Medical stability;Morbid Obesity;Self-care education;Uncontrolled Diabetes;Uncontrolled Pain;Weight bearing restrictions   Possible Resolutions to  [employer]: titrate pain medication to minimal tolerable dosing, titrating and resuming home diabetes medications, monitorring of labs for hyperkalemia/hyponatremia/dehydration, and monitorring of sleep pattern   Continued Need for Acute Rehabilitation Level of Care: The patient requires daily medical management by a physician with specialized training in physical medicine and rehabilitation for the following reasons: Direction of a multidisciplinary physical rehabilitation program to maximize functional independence : Yes Medical management of patient stability for increased activity during participation in an intensive rehabilitation regime.: Yes Analysis of laboratory values and/or radiology reports with any subsequent need for medication adjustment and/or medical intervention. : Yes   I attest that I was present, lead the team conference, and concur with the assessment and plan of the team.   Konrad Dolores [facility] 09/14/2023, 7:59 PM

## 2023-09-14 NOTE — Progress Notes (Signed)
 Physical Therapy Session Note  Patient Details  Name: Linda Mason MRN: [id number] Date of Birth: [birth year]  Today's Date: 09/14/2023 PT Individual Time: 6962-9528 PT Individual Time Calculation (min): 53 min   Short Term Goals: Week 2:  PT Short Term Goal 1 (Week 2): STG = LTG due to ELOS  Skilled Therapeutic Interventions/Progress Updates:    Pt presents in room in [facility], agreeable to PT. Pt mother and interpreter present for family education session. Session focused on transfer training to pt family personal vehicle, WC mobility, and gait training for tolerance to upright and weightbearing tolerance.  Pt self propels WC over elevator threshold, navigated busy atrium floor and outside over uneven terrain with supervision/CGA for incline/decline. Pt brother arrives with personal vehicle, pt able to complete transfer with RW to front seat of car, pt fearful of sitting to car seat due to bucket seat however pt completes with CGA, requires min assist for managing RLE into car. Pt able to complete transfer out of car with CGA with RW. Pt then self propels slowly back to 4th floor to main gym with supervision.  Pt completes sit<>stand with supervision, ambulates 60' and 72' with supervision with RW, improved gait speed, does not require standing rest breaks. Pt takes seated rest break between gait trials due to fatigue. Pt then completes pregait training for foot clearance and weightbearing tolerance with pt completing x10 step taps with RLE to airex pad, x1 repetition with LLE to airex pad.  Pt returns to sitting in WC, then returns to room self propelling with supervioins. Pt remains seated in WC with all needs within reach, cal light in place and family members present at end of session.   Therapy Documentation Precautions:  Precautions Precautions: Knee Precaution Booklet Issued: No Recall of Precautions/Restrictions: Impaired Precaution/Restrictions Comments: Prader-willi  syndrome Required Braces or Orthoses: Knee Immobilizer - Right Knee Immobilizer - Right: On when out of bed or walking Restrictions Weight Bearing Restrictions Per Provider Order: Yes RLE Weight Bearing Per Provider Order: Weight bearing as tolerated  Therapy/Group: Individual Therapy  Edwin Cap PT, DPT 09/14/2023, 4:14 PM

## 2023-09-14 NOTE — Progress Notes (Signed)
 Patient ID: Aqua Denslow [facility], female   DOB: Sep 09, 1995, 28 y.o.   MRN: [id number]   SW faxed outpatient PT/OT referral to [facility].   SW faxed PCS referral to [facility] (p:[phone]/f:[phone]). *reports this form needs to go to [facility]. SW faxed form to [facility] [phone]).   SW faxed incontinence referral to [employer] (p:[phone]/f:[phone]).  SW provided letter to pt mother for transportation needs.   Cecile Sheerer, MSW, LCSW Office: [phone] Cell: [phone] Fax: [phone]

## 2023-09-14 NOTE — Progress Notes (Addendum)
 PROGRESS NOTE   Subjective/Complaints: 7 out of 10 pain in her leg intermittently overnight, otherwise stable Continent of bowel bladder, last bowel movement yesterday Blood sugars remain well-controlled  Mom has no complaints, concerns today.  Patient endorsing pain in her left foot with palpation.  ROS:   Pt denies SOB, abd pain, CP, N/V/C/D, and vision change + LLE swelling--ongoing  Objective:   No results found.  No results for input(s): "WBC", "HGB", "HCT", "PLT" in the last 72 hours.  No results for input(s): "NA", "K", "CL", "CO2", "GLUCOSE", "BUN", "CREATININE", "CALCIUM" in the last 72 hours.   Intake/Output Summary (Last 24 hours) at 09/14/2023 0943 Last data filed at 09/14/2023 0749 Gross per 24 hour  Intake 476 ml  Output --  Net 476 ml        Physical Exam: Vital Signs Blood pressure (!) 131/90, pulse 86, temperature (!) 97.5 F (36.4 C), temperature source Oral, resp. rate 18, weight 107.2 kg, SpO2 100%.     General: awake, alert, appropriate, sitting up in bedside chair. HENT: conjugate gaze; oropharynx moist CV: regular rate and rhythm; no JVD Pulmonary: CTA B/L; no W/R/R- good air movement GI: soft, NT, ND, (+)BS Psychiatric: appropriate Neurological: alert, oriented x 4. Extremities: 2+ bilateral lower extremity edema.  Skin: C/D/I. Mild swelling  medial patella;--in extension brace.  No obvious skin breakdown.    MSK:     Apparent deformities.  Right lower extremity extension brace donned + TTP dorsum of left foot, without any apparent crepitus, abnormality, or range of motion deficit.  Neurologic exam:  Cognition: AAO to person, place, time and event.  Follows all simple commands. + Baseline higher-level cognitive deficits in insight and attention  Insight: Poor insight into current condition.  Mood: Pleasant affect, appropriate mood.  Sensation, reflexes, cranial nerves,  coordination, and tone without apparent deficits.        Strength:                RUE: 5/5 SA, 5/5 EF, 5/5 EE, 5/5 WE, 5/5 FF, 5/5 FA                 LUE: 5/5 SA, 5/5 EF, 5/5 EE, 5/5 WE, 5/5 FF, 5/5 FA                 RLE: Limited due to extension brace; 4/5 HF, 4/5 DF, 4/5 PF                LLE:  4/5 HF, 4/5 KE, 4/5 DF, 5/5 EHL, 4/5 PF  --limited by pain.     Assessment/Plan: 1. Functional deficits which require 3+ hours per day of interdisciplinary therapy in a comprehensive inpatient rehab setting. Physiatrist is providing close team supervision and 24 hour management of active medical problems listed below. Physiatrist and rehab team continue to assess barriers to discharge/monitor patient progress toward functional and medical goals  Care Tool:  Bathing    Body parts bathed by patient: Right arm, Left arm, Chest, Abdomen   Body parts bathed by helper: Front perineal area, Buttocks, Right upper leg, Left upper leg, Right lower leg, Left lower leg Body parts n/a: Right upper leg,  Right lower leg   Bathing assist Assist Level: Maximal Assistance - Patient 24 - 49%     Upper Body Dressing/Undressing Upper body dressing   What is the patient wearing?: Pull over shirt    Upper body assist Assist Level: Minimal Assistance - Patient > 75%    Lower Body Dressing/Undressing Lower body dressing      What is the patient wearing?: Incontinence brief     Lower body assist Assist for lower body dressing: 2 Helpers     Toileting Toileting    Toileting assist Assist for toileting: Moderate Assistance - Patient 50 - 74%     Transfers Chair/bed transfer  Transfers assist     Chair/bed transfer assist level: Contact Guard/Touching assist     Locomotion Ambulation   Ambulation assist   Ambulation activity did not occur: Safety/medical concerns  Assist level: Contact Guard/Touching assist Assistive device: Walker-rolling Max distance: 62   Walk 10 feet  activity   Assist  Walk 10 feet activity did not occur: Safety/medical concerns  Assist level: Contact Guard/Touching assist Assistive device: Walker-rolling   Walk 50 feet activity   Assist Walk 50 feet with 2 turns activity did not occur: Safety/medical concerns  Assist level: Contact Guard/Touching assist Assistive device: Walker-rolling    Walk 150 feet activity   Assist Walk 150 feet activity did not occur: Safety/medical concerns         Walk 10 feet on uneven surface  activity   Assist Walk 10 feet on uneven surfaces activity did not occur: Safety/medical concerns         Wheelchair     Assist Is the patient using a wheelchair?: Yes Type of Wheelchair: Manual    Wheelchair assist level: Supervision/Verbal cueing Max wheelchair distance: 150    Wheelchair 50 feet with 2 turns activity    Assist        Assist Level: Supervision/Verbal cueing   Wheelchair 150 feet activity     Assist      Assist Level: Supervision/Verbal cueing   Blood pressure (!) 131/90, pulse 86, temperature (!) 97.5 F (36.4 C), temperature source Oral, resp. rate 18, weight 107.2 kg, SpO2 100%.  Medical Problem List and Plan: 1. Functional deficits secondary to right knee patellar dislocation.  Status post right knee arthroscopic assisted patellar reduction with lateral release.  Right knee arthroscopic loose body removal right knee open management of lateral femoral condyle and medial patella fracture 08/27/2023.  Weightbearing as tolerated.  Knee immobilizer when out of bed or when ambulating.             -patient may shower             -ELOS/Goals: 10-14 days MinA/S- 09/15/33              Con't CIR    - 3/3: Will need WC and walker for home; OP therapies only. Making progress but limited by poor pain tolerance.   - 3/11: Car simulator today, went well, working towards discharge goals   2.  Antithrombotics: -DVT/anticoagulation:  Mechanical: Antiembolism  stockings, thigh (TED hose) Bilateral lower extremities.  Venous Dopplers negative             -antiplatelet therapy: continue Aspirin 325 mg daily x 2 weeks   3. Pain Management: continue Neurontin 100 mg 3 times daily Robaxin 500 mg 3 times daily, Naprosyn 500 mg twice daily, oxycodone as needed  -2-27: Poor pain control interrupting sleep and very limiting for therapy progress.  Increase regimen to scheduled Tylenol 1000 mg 3 times daily, Robaxin 750 mg 3 times daily, and scheduled oxycodone 5 mg 3 times daily in addition to 5 mg every 4 hours as needed.  2-28: Improvement in pain control with the above measures, does well with premedicating therapies with PRNs.  Increase nightly gabapentin to 300 mg, can consider switching scheduled oxycodone to long-acting OxyContin 10 mg twice daily over the weekend if no improvement (discussed this with mother/therapies, patient will endorse severe pain regardless)  3/3: Using PRN oxycodone overnight consistently  3-5: Used decreasing to once daily; can likely reduce standing in 1 to 2 days  3-6: Last as needed used 3-4.  Will DC standing oxycodone  3-7: Voltaren to L hip QID. DC naprosyn 500 mg BID due to risk of bleeding and peripheral edema   3/8-3/9- again today, - said pain is controlled today- focused on bathroom  3-11: Reduce pain regimen, tramadol 50 mg every 6 hours as needed for moderate pain, oxycodone 5 mg every 6 hours as needed for severe pain.  Add Voltaren gel to the dorsal left foot.   4. Mood/Behavior/Sleep: Provide emotional support             -antipsychotic agents: N/A  -2-27: Poor sleep due to right leg pain.  Pain control as above, add as needed melatonin  3-3: Sleep remains intermittently interrupted, able to go back to sleep with pain medication.--Resolved   5. Neuropsych/cognition: This patient is not capable of making decisions on her own behalf.   6. Skin/Wound Care/RASH new 2/28: Routine skin checks  -2-28: Nursing checking  with mom regarding any prior medication reactions.  No recently started medications (noticed before AM lasix).  Continue as needed Benadryl.  3/3: Rash appears resolved  3/4: Itching from moisture in extension brace; remove when in bed--improved    3/7: Edema management on LLE with ACE wrap when OOB--patient has not been getting, per therapies refusing   7. Fluids/Electrolytes/Nutrition: Routine in and outs with follow-up chemistries    - K 5.9, Na 132, BUN 62 - IVF at 100 cc/hr 2/26  - 2-28: BUN/creatinine improved with IV fluids, remains hyperglycemic and with potassium up to 6.0; increase insulin as below, continue IV fluids, give Lasix 40 mg IV today and retest potassium this afternoon.   -- Responsive to Lasix, repeat 5.1. Stop IVF.  Labs in AM ordered - 3/1: K+ reviewed and normalized   - 3/3: BMP with decreased sodium 129; has been slightly low around 132.  Otherwise, stable. 3/4: Na improved, K stable 3-6: BMP stable, continue current regimen  8.  Prader-Willi syndrome.  Patient lives with parents independent prior to admission   -Will get behavioral plan in place to assist with staff understanding patient's cognitive baseline and motivation; per mom, does well with food motivators and puzzles/coloring books.  Would use food sparingly given uncontrolled diabetes.   9.  Diabetes mellitus.  Hemoglobin A1c 9.5.  continue NovoLog 6 units 3 times daily, Semglee 55 units nightly.  Glycemia likely complicated by poor pain control   - home regimen per chart review endocrinology 06/22/23: Basal insulin: Continue Lantus to 50 units daily Prandial insulin: Continue Novolog 12 units with meals Correctional insulin: Continue Novolog 2: 50 > 150 with meals and the half dose before bed Non-insulin agents: - tirzepatide 5 mg weekly, increase as able (is currently finishing out their last ozempic pen before switching) - Continue metformin ER 1000 mg bid - Continue empagliflozin 25 mg  daily    - Per  mom, baseline blood sugar between 150 and 300    - Was switched to semglee 32 U BID yesterday; IV fluids today   2/28: Resume home metformin, hold Jardiance due to hyperkalemia, increase Lantus to 45 units twice daily  Regimen adjusted as per diabetic coordinator recommendations-- 13U premeal + SSI + 50U lantis BID  3/3: BG remains elevated; increase lantus to 60 U BID, premeal to 15 U TID--improved this afternoon.  If potassium remains stable and sodium improved in a.m. labs, will resume Jardiance-- 3/4: K stable, ordered Jardiance 25 mg daily 3-5: Blood glucose down today, with reduction in pain and resumption of home medications concern for overtreatment; switch Premeal insulin to home 12 units 3 times daily, and long-acting Semglee to 30 units twice daily.  Okay to go a little high to avoid overnight lows, as she generally is lowest first thing in the morning.  Will titrate long-acting further based on a.m. reads. 3/6: Blood sugars in normal range with adjusted dosing  3/7: Will transition back to home Semglee every morning only at 60 units 3-8  3/8-11 better BG control- con't regimen for now Recent Labs    09/13/23 1630 09/13/23 2128 09/14/23 0604  GLUCAP 118* 152* 130*     10.  Hypertension.  Continue Lisinopril 40 mg daily, Toprol-XL 25 mg daily .  -Normotensive to slightly low    09/14/2023    3:15 AM 09/13/2023   10:45 PM 09/13/2023    6:06 AM  Vitals with BMI  Systolic 131 96 126  Diastolic 90 52 77  Pulse 86 81 85    11.  Morbid obesity.  BMI 44.88.  Dietary follow-up.  Complicated by significant food motivation due to Prader-Willi syndrome.   12.  Bilateral lower extremity edema.  Will get duplexes today; if negative, will get Ace wrap for left lower extremity and elevate in bed.  - 3/7: Patient received 1x dose Eliquis 10 mg before final duplex read as no DVT LLE; patient did not allow study to be done on right   - DC eliquis, resume prior Aspirin 325 mg daily 3/8   -  Lasix 20 mg x1 for edema   - ACE wrap to LLE when OOB for swelling  3-11: Bilateral lower extremity swelling remains persistent, encourage use of Ace wrap as above--patient refusing  LOS: 13 days A FACE TO FACE EVALUATION WAS PERFORMED  Angelina Sheriff 09/14/2023, 9:43 AM

## 2023-09-15 ENCOUNTER — Other Ambulatory Visit (HOSPITAL_COMMUNITY): Payer: Self-pay

## 2023-09-15 ENCOUNTER — Telehealth (HOSPITAL_COMMUNITY): Payer: Self-pay | Admitting: Pharmacy Technician

## 2023-09-15 LAB — GLUCOSE, CAPILLARY
Glucose-Capillary: 153 mg/dL — ABNORMAL HIGH (ref 70–99)
Glucose-Capillary: 130 mg/dL — ABNORMAL HIGH (ref 70–99)
Glucose-Capillary: 155 mg/dL — ABNORMAL HIGH (ref 70–99)
Glucose-Capillary: 133 mg/dL — ABNORMAL HIGH (ref 70–99)

## 2023-09-15 MED ORDER — DICLOFENAC SODIUM 1 % EX GEL
2.0000 g | Freq: Four times a day (QID) | CUTANEOUS | 0 refills | Status: AC
  Filled 2023-09-15: qty 50, 7d supply, fill #0

## 2023-09-15 MED ORDER — INSULIN GLARGINE 100 UNIT/ML SOLOSTAR PEN
60.0000 [IU] | PEN_INJECTOR | Freq: Every day | SUBCUTANEOUS | 11 refills | Status: AC
  Filled 2023-09-15: qty 15, 25d supply, fill #0

## 2023-09-15 MED ORDER — METFORMIN HCL 500 MG PO TABS
1000.0000 mg | ORAL_TABLET | Freq: Two times a day (BID) | ORAL | 0 refills | Status: AC
  Filled 2023-09-15: qty 60, 15d supply, fill #0
  Filled 2023-09-15: qty 120, 30d supply, fill #0

## 2023-09-15 MED ORDER — EMPAGLIFLOZIN 25 MG PO TABS
25.0000 mg | ORAL_TABLET | Freq: Every day | ORAL | 0 refills | Status: AC
Start: 2023-09-15 — End: ?
  Filled 2023-09-15: qty 30, 30d supply, fill #0

## 2023-09-15 MED ORDER — OMEPRAZOLE 40 MG PO CPDR
40.0000 mg | DELAYED_RELEASE_CAPSULE | Freq: Every day | ORAL | 0 refills | Status: AC
Start: 2023-09-15 — End: ?
  Filled 2023-09-15: qty 30, 30d supply, fill #0

## 2023-09-15 MED ORDER — TRAMADOL HCL 50 MG PO TABS
50.0000 mg | ORAL_TABLET | Freq: Four times a day (QID) | ORAL | 0 refills | Status: AC | PRN
  Filled 2023-09-15: qty 30, 8d supply, fill #0

## 2023-09-15 MED ORDER — METOPROLOL SUCCINATE ER 25 MG PO TB24
25.0000 mg | ORAL_TABLET | Freq: Every day | ORAL | 0 refills | Status: AC
  Filled 2023-09-15: qty 30, 30d supply, fill #0

## 2023-09-15 MED ORDER — METHOCARBAMOL 500 MG PO TABS
500.0000 mg | ORAL_TABLET | Freq: Three times a day (TID) | ORAL | 0 refills | Status: AC
  Filled 2023-09-15: qty 90, 30d supply, fill #0

## 2023-09-15 MED ORDER — LEVOTHYROXINE SODIUM 50 MCG PO TABS
50.0000 ug | ORAL_TABLET | Freq: Every day | ORAL | 0 refills | Status: AC
  Filled 2023-09-15: qty 30, 30d supply, fill #0

## 2023-09-15 MED ORDER — INSULIN ASPART 100 UNIT/ML FLEXPEN
12.0000 [IU] | PEN_INJECTOR | Freq: Three times a day (TID) | SUBCUTANEOUS | 0 refills | Status: AC
  Filled 2023-09-15: qty 9, 25d supply, fill #0

## 2023-09-15 MED ORDER — MELATONIN 5 MG PO TABS
5.0000 mg | ORAL_TABLET | Freq: Every evening | ORAL | 0 refills | Status: AC | PRN
  Filled 2023-09-15: qty 30, 30d supply, fill #0

## 2023-09-15 MED ORDER — VITAMIN D (ERGOCALCIFEROL) 1.25 MG (50000 UNIT) PO CAPS
50000.0000 [IU] | ORAL_CAPSULE | ORAL | 0 refills | Status: AC
  Filled 2023-09-15: qty 5, 35d supply, fill #0

## 2023-09-15 MED ORDER — GABAPENTIN 100 MG PO CAPS
ORAL_CAPSULE | ORAL | 0 refills | Status: AC
  Filled 2023-09-15: qty 60, 12d supply, fill #0

## 2023-09-15 NOTE — Progress Notes (Signed)
 Patient ID: Linda Mason [facility], female   DOB: 1995-10-11, 28 y.o.   MRN: [id number]  SW informed by PT current RW delivered is to big and pt needs youth RW.  SW placed new order with [employer] via parachute so RW can be swapped out.   Cecile Sheerer, MSW, LCSW Office: [phone] Cell: [phone] Fax: [phone]

## 2023-09-15 NOTE — Progress Notes (Signed)
 Occupational Therapy Session Note  Patient Details  Name: Linda Mason [facility] MRN: [id number] Date of Birth: April 09, 1996  Today's Date: 09/15/2023 OT Individual Time: 4098-1191 OT Individual Time Calculation (min): 43 min    Short Term Goals: Week 2:  OT Short Term Goal 1 (Week 2): STG = LTG (d/t ELOS)  Skilled Therapeutic Interventions/Progress Updates:    Pt received sitting up with no c/o pain, agreeable to OT session. Pt stood from w/c with cueing for hand placement, CGA for safety. She used the RW to transfer, several steps, to her new w/c adjusted by PT with CGA. She required fast placement of leg rests d/t knee pain. Pt was able to self propel her w/c 100 ft to the therapy gym. Discussed delivery of standard RW vs youth walker which she was using prior. She was perseverative on taking home the RW she had already been given despite education. Pt completed blocked practice sit <> stand, 3x3 repetitions with only single UE support, to challenge generalized strengthening for ADL transfers, as well as increasing functional activity tolerance and cardiorespiratory endurance. Pt required close (S). She requested to try the stairs. Demonstration provided re leading with the LLE. She attempted but with extra time was unable to sequence/tolerate a big enough weight shift to unload LLE to step. Tried again with a smaller step, only 2 steps. She ended with 3x10 overhead reach with a 2 lb ball to increase BUE strength and endurance needed to support herself on the RW. She returned to her room and was left sitting in the w/c with all needs met.    Therapy Documentation Precautions:  Precautions Precautions: Knee Precaution Booklet Issued: No Recall of Precautions/Restrictions: Impaired Precaution/Restrictions Comments: Prader-willi syndrome Required Braces or Orthoses: Knee Immobilizer - Right Knee Immobilizer - Right: On when out of bed or walking Restrictions Weight Bearing Restrictions Per  Provider Order: Yes RLE Weight Bearing Per Provider Order: Weight bearing as tolerated  Therapy/Group: Individual Therapy  Crissie Reese 09/15/2023, 6:26 AM

## 2023-09-15 NOTE — Progress Notes (Signed)
 Physical Therapy Discharge Summary  Patient Details  Name: Linda Mason MRN: [id number] Date of Birth: [birth year]  Date of Discharge from PT service:September 15, 2023  Today's Date: 09/15/2023 PT Individual Time: 1104-1202 PT Individual Time Calculation (min): 58 min    Patient has met 7 of 7 long term goals due to improved activity tolerance, improved balance, improved postural control, increased strength, decreased pain, and ability to compensate for deficits.  Patient to discharge at  a [facility] and short distance ambulatory  level Supervision.   Patient's care partner is independent to provide the necessary physical assistance at discharge.  Reasons goals not met: N/A  Recommendation:  Patient will benefit from ongoing skilled PT services in outpatient setting to continue to advance safe functional mobility, address ongoing impairments in gait mechanics, weightbearing tolerance, endurance, and minimize fall risk.  Equipment: Youth RW, WC, BSC  Reasons for discharge: treatment goals met and discharge from hospital  Patient/family agrees with progress made and goals achieved: Yes  PT Discharge Precautions/Restrictions Precautions Precautions: Knee Precaution Booklet Issued: No Recall of Precautions/Restrictions: Impaired Precaution/Restrictions Comments: Prader-willi syndrome Required Braces or Orthoses: Knee Immobilizer - Right Knee Immobilizer - Right: On when out of bed or walking Restrictions Weight Bearing Restrictions Per Provider Order: Yes RLE Weight Bearing Per Provider Order: Weight bearing as tolerated Pain Interference Pain Interference Pain Effect on Sleep: 1. Rarely or not at all Pain Interference with Therapy Activities: 2. Occasionally Pain Interference with Day-to-Day Activities: 2. Occasionally Cognition Overall Cognitive Status: History of cognitive impairments - at baseline Arousal/Alertness: Awake/alert Orientation Level: Oriented X4 Memory:  Impaired Memory Impairment: Decreased short term memory Awareness: Impaired Problem Solving: Impaired Safety/Judgment: Impaired Sensation Sensation Light Touch: Appears Intact Hot/Cold: Appears Intact Proprioception: Appears Intact Stereognosis: Not tested Coordination Gross Motor Movements are Fluid and Coordinated: Yes Fine Motor Movements are Fluid and Coordinated: Yes Motor  Motor Motor: Other (comment);Abnormal postural alignment and control Motor - Discharge Observations: mobility limited due to R knee immobilizer and RLE pain  Mobility Bed Mobility Bed Mobility: Sit to Supine;Supine to Sit Supine to Sit: Supervision/Verbal cueing Sit to Supine: Minimal Assistance - Patient > 75% Transfers Transfers: Sit to Stand;Stand to Sit;Stand Pivot Transfers Sit to Stand: Supervision/Verbal cueing Stand to Sit: Supervision/Verbal cueing Stand Pivot Transfers: Supervision/Verbal cueing Stand Pivot Transfer Details: Verbal cues for precautions/safety Transfer (Assistive device): Rolling walker Locomotion  Gait Ambulation: Yes Gait Assistance: Supervision/Verbal cueing Gait Distance (Feet): 72 Feet  Trunk/Postural Assessment  Cervical Assessment Cervical Assessment: Exceptions to [facility] (forward head) Thoracic Assessment Thoracic Assessment: Exceptions to [facility] (rounded shoulders) Lumbar Assessment Lumbar Assessment: Exceptions to [facility] (posterior pelvic tilt) Postural Control Postural Control: Deficits on evaluation Righting Reactions: delayed and inadequate Protective Responses: decreased Postural Limitations: decreased  Balance Balance Balance Assessed: Yes Static Sitting Balance Static Sitting - Balance Support: Feet supported;No upper extremity supported Static Sitting - Level of Assistance: 5: Stand by assistance Dynamic Sitting Balance Dynamic Sitting - Balance Support: Feet supported;Bilateral upper extremity supported;During functional activity Dynamic Sitting - Level  of Assistance: 5: Stand by assistance Static Standing Balance Static Standing - Balance Support: Bilateral upper extremity supported;During functional activity Static Standing - Level of Assistance: 5: Stand by assistance Dynamic Standing Balance Dynamic Standing - Balance Support: During functional activity;Bilateral upper extremity supported Dynamic Standing - Level of Assistance: 5: Stand by assistance Extremity Assessment  RLE Assessment General Strength Comments: unable to test formally due to precautions and knee immobilizer LLE Assessment LLE Assessment: Exceptions to [facility] General Strength  Comments: unable to formally test however demonstrates grossly 3+/5 functionally   Edwin Cap PT, DPT 09/15/2023, 3:03 PM

## 2023-09-15 NOTE — Progress Notes (Signed)
 Physical Therapy Session Note  Patient Details  Name: Linda Mason MRN: [id number] Date of Birth: 08-16-1995  Today's Date: 09/15/2023 PT Individual Time: 2130-8657 PT Individual Time Calculation (min): 38 min   Short Term Goals: Week 2:  PT Short Term Goal 1 (Week 2): STG = LTG due to ELOS  Skilled Therapeutic Interventions/Progress Updates: Pt presented in w/c agreeable to therapy. Pt states increased pain in RLE, nsg notified with rest and repositioning provided during session. Pt with limited tolerance to placing RLE on ELR this session however pt was able to tolerate ELR at lowest setting with foot plate moved out of way. When asked pt stating pain 10/10.  Pt propelled to main gym and after instruction was able to stand with close supervision and pick up object on floor with use of reacher and close supervision. Pt also participated in TUG with close supervision and a time of 1:53. Throughout session pt requiring frequent encouragement due to pain. Pt propelled back to room in same manner as prior and with increased time was able to navigate w/c in room to align by bed. Pt left in w/c at end of session with mom present and current needs met.      Therapy Documentation Precautions:  Precautions Precautions: Knee Precaution Booklet Issued: No Recall of Precautions/Restrictions: Impaired Precaution/Restrictions Comments: Prader-willi syndrome Required Braces or Orthoses: Knee Immobilizer - Right Knee Immobilizer - Right: On when out of bed or walking Restrictions Weight Bearing Restrictions Per Provider Order: Yes RLE Weight Bearing Per Provider Order: Weight bearing as tolerated General:   Vital Signs: Therapy Vitals Temp: (!) 97.5 F (36.4 C) Temp Source: Oral Pulse Rate: 85 Resp: 16 BP: 129/81 Patient Position (if appropriate): Sitting Oxygen Therapy SpO2: 100 % O2 Device: Room Air Pain:       Therapy/Group: Individual Therapy  Muranda Coye 09/15/2023,  3:38 PM

## 2023-09-15 NOTE — Plan of Care (Signed)
  Problem: RH Stairs Goal: LTG Patient will ambulate up and down stairs w/assist (PT) Description: LTG: Patient will ambulate up and down # of stairs with assistance (PT) Outcome: Not Applicable Note: Discontinued due to pt family installing ramp and poor weightbearing tolerance with RLE

## 2023-09-15 NOTE — Progress Notes (Signed)
 Physical Therapy Session Note  Patient Details  Name: Linda Mason MRN: [id number] Date of Birth: [birth year]  Today's Date: 09/15/2023 PT Individual Time: 1104-1202 PT Individual Time Calculation (min): 58 min   Short Term Goals: Week 1:  PT Short Term Goal 1 (Week 1): Pt will complete bed mobility with CGA PT Short Term Goal 1 - Progress (Week 1): Progressing toward goal PT Short Term Goal 2 (Week 1): Pt will complete transfer with LRAD with min assist PT Short Term Goal 2 - Progress (Week 1): Met PT Short Term Goal 3 (Week 1): Pt will ambulate 20' with min assist and 2nd person WC follow PT Short Term Goal 3 - Progress (Week 1): Met  Skilled Therapeutic Interventions/Progress Updates:    Pt presents in room in [facility] with nursing staff present to remove sutures. Therapist provides therapeutic use of self while nurse removes sutures due to pt crying and reporting feeling scared of pain. Therapist provides education on need to remove sutures prior to DC with pt verbalizing understanding, agreeable to suture removal with therapist holding pt hand and providing words of encouragement throughout. Session then focused on [facility] mobility and equipment management as pt personal equipment arrived prior to session. Pt RW adjusted for comfort and WC lowered to hemi height to promote improved mobility and independence. Pt educated throughout [facility] adjustment on proper fit of WC to allow improved propulsion and comfort with sitting with pt verbalizing understanding. Pt self propels WC 150' with supervision, does require occasional verbal cues for attention to task and awareness of surroundings with distractions. Pt returns to room and remains seated in [facility] with all needs within reach, cal light in place and chair alarm donned and activated at end of session.   Therapy Documentation Precautions:  Precautions Precautions: Knee Precaution Booklet Issued: No Recall of Precautions/Restrictions:  Impaired Precaution/Restrictions Comments: Prader-willi syndrome Required Braces or Orthoses: Knee Immobilizer - Right Knee Immobilizer - Right: On when out of bed or walking Restrictions Weight Bearing Restrictions Per Provider Order: Yes RLE Weight Bearing Per Provider Order: Weight bearing as tolerated   Therapy/Group: Individual Therapy  Edwin Cap PT, DPT 09/15/2023, 3:03 PM

## 2023-09-15 NOTE — Telephone Encounter (Signed)
 Pharmacy Patient Advocate Encounter   Received notification that prior authorization for traMADol HCl 50MG  tablets is required/requested.   Insurance verification completed.   The patient is insured through [place] .   Per test claim: PA required; PA submitted to above mentioned insurance via CoverMyMeds Key/confirmation #/EOC [id number] Status is pending

## 2023-09-15 NOTE — Progress Notes (Signed)
 Inpatient Rehabilitation Discharge Medication Review by a Pharmacist  A complete drug regimen review was completed for this patient to identify any potential clinically significant medication issues.  High Risk Drug Classes Is patient taking? Indication by Medication  Antipsychotic No   Anticoagulant No   Antibiotic No   Opioid Yes Tramadol prn pain  Antiplatelet No   Hypoglycemics/insulin Yes Insulin, Jardiance, Metformin- DM  Vasoactive Medication Yes Metoprolol - HTN  Chemotherapy No   Other Yes Gabapentin - pain Omeprazole - reflux Vitamin D - supplement Levothyroxine - low thyroid Methocarbamol prn spasms     Type of Medication Issue Identified Description of Issue Recommendation(s)  Drug Interaction(s) (clinically significant)     Duplicate Therapy     Allergy     No Medication Administration End Date     Incorrect Dose     Additional Drug Therapy Needed     Significant med changes from prior encounter (inform family/care partners about these prior to discharge).    Other       Clinically significant medication issues were identified that warrant physician communication and completion of prescribed/recommended actions by midnight of the next day:  No  Name of provider notified for urgent issues identified:   Provider Method of Notification:     Pharmacist comments:   Time spent performing this drug regimen review (minutes):  20 minutes  Thank you Okey Regal, PharmD

## 2023-09-15 NOTE — Progress Notes (Addendum)
 PROGRESS NOTE   Subjective/Complaints:  Feeling good, prepared for discharge tomorrow.  Mom concerned regarding Velcro breaking on cold pack prescribed by orthopedics; asking if this can be replaced  Mom asking about letter for return to school at discharge; believes that observation to maintain weightbearing precautions on the extremity should not be an issue.  She will have to get it in and out of a bus for this, which should have handicap accessibility.  ROS:   Pt denies SOB, abd pain, CP, N/V/C/D, and vision change + LLE swelling--ongoing + RLE pain - ongoing  Objective:   No results found.  No results for input(s): "WBC", "HGB", "HCT", "PLT" in the last 72 hours.  No results for input(s): "NA", "K", "CL", "CO2", "GLUCOSE", "BUN", "CREATININE", "CALCIUM" in the last 72 hours.   Intake/Output Summary (Last 24 hours) at 09/15/2023 0848 Last data filed at 09/15/2023 0700 Gross per 24 hour  Intake 1197 ml  Output --  Net 1197 ml        Physical Exam: Vital Signs Blood pressure 124/84, pulse 93, temperature 97.8 F (36.6 C), resp. rate 19, weight 107.2 kg, SpO2 91%.  General: awake, alert, appropriate, sitting up in bedside chair. HENT: conjugate gaze; oropharynx moist CV: regular rate and rhythm; no JVD Pulmonary: CTA B/L; no W/R/R- good air movement GI: soft, NT, ND, (+)BS Psychiatric: appropriate mood and affect; becomes distressed when discussing painful activities like ambulating or removing sutures.  Neurological: alert, oriented x 4. Extremities: 2+ bilateral lower extremity edema.--Stable from prior exams  Skin: C/D/I   No obvious skin breakdown.  Sutures on bilateral patella remain intact, some mild surrounding erythema without discharge or warmth.  Sutures are starting to embed in the skin.    MSK:     Apparent deformities.  Right lower extremity extension brace donned + No further TTP on the  dorsal left foot, TTP on dorsal right foot without deformity or crepitus.  Full active range of motion.  Neurologic exam:  Cognition: AAO to person, place, time and event.  Follows all simple commands. + Baseline higher-level cognitive deficits in insight and attention  Insight: Poor insight into current condition.  Mood: Pleasant affect, appropriate mood.  Sensation, reflexes, cranial nerves, coordination, and tone without apparent deficits.        Strength:                RUE: 5/5 SA, 5/5 EF, 5/5 EE, 5/5 WE, 5/5 FF, 5/5 FA                 LUE: 5/5 SA, 5/5 EF, 5/5 EE, 5/5 WE, 5/5 FF, 5/5 FA                 RLE: Limited due to extension brace; 4/5 HF, 4/5 DF, 4/5 PF                LLE:  4/5 HF, 4/5 KE, 4/5 DF, 5/5 EHL, 4/5 PF  --limited by pain, unchanged     Assessment/Plan: 1. Functional deficits which require 3+ hours per day of interdisciplinary therapy in a comprehensive inpatient rehab setting. Physiatrist is providing close team supervision and 24 hour  management of active medical problems listed below. Physiatrist and rehab team continue to assess barriers to discharge/monitor patient progress toward functional and medical goals  Care Tool:  Bathing    Body parts bathed by patient: Right arm, Left arm, Chest, Abdomen, Front perineal area, Left upper leg, Face   Body parts bathed by helper: Buttocks, Left lower leg Body parts n/a: Right lower leg, Right upper leg   Bathing assist Assist Level: Minimal Assistance - Patient > 75%     Upper Body Dressing/Undressing Upper body dressing   What is the patient wearing?: Pull over shirt, Bra    Upper body assist Assist Level: Set up assist    Lower Body Dressing/Undressing Lower body dressing      What is the patient wearing?: Incontinence brief, Pants     Lower body assist Assist for lower body dressing: Moderate Assistance - Patient 50 - 74%     Toileting Toileting    Toileting assist Assist for toileting:  Minimal Assistance - Patient > 75%     Transfers Chair/bed transfer  Transfers assist     Chair/bed transfer assist level: Contact Guard/Touching assist     Locomotion Ambulation   Ambulation assist   Ambulation activity did not occur: Safety/medical concerns  Assist level: Contact Guard/Touching assist Assistive device: Walker-rolling Max distance: 62   Walk 10 feet activity   Assist  Walk 10 feet activity did not occur: Safety/medical concerns  Assist level: Contact Guard/Touching assist Assistive device: Walker-rolling   Walk 50 feet activity   Assist Walk 50 feet with 2 turns activity did not occur: Safety/medical concerns  Assist level: Contact Guard/Touching assist Assistive device: Walker-rolling    Walk 150 feet activity   Assist Walk 150 feet activity did not occur: Safety/medical concerns         Walk 10 feet on uneven surface  activity   Assist Walk 10 feet on uneven surfaces activity did not occur: Safety/medical concerns         Wheelchair     Assist Is the patient using a wheelchair?: Yes Type of Wheelchair: Manual    Wheelchair assist level: Supervision/Verbal cueing Max wheelchair distance: 150    Wheelchair 50 feet with 2 turns activity    Assist        Assist Level: Supervision/Verbal cueing   Wheelchair 150 feet activity     Assist      Assist Level: Supervision/Verbal cueing   Blood pressure 124/84, pulse 93, temperature 97.8 F (36.6 C), resp. rate 19, weight 107.2 kg, SpO2 91%.  Medical Problem List and Plan: 1. Functional deficits secondary to right knee patellar dislocation.  Status post right knee arthroscopic assisted patellar reduction with lateral release.  Right knee arthroscopic loose body removal right knee open management of lateral femoral condyle and medial patella fracture 08/27/2023.  Weightbearing as tolerated.  Knee immobilizer when out of bed or when ambulating.              -patient may shower             -ELOS/Goals: 10-14 days MinA/S- 09/15/33              Con't CIR    - 3/3: Will need WC and walker for home; OP therapies only. Making progress but limited by poor pain tolerance.   - 3/11: Car simulator today, went well, working towards discharge goals  - 3/12: Will provide letter to mother of accommodations needed for bus and daycare/school program after  discharge   2.  Antithrombotics: -DVT/anticoagulation:  Mechanical: Antiembolism stockings, thigh (TED hose) Bilateral lower extremities.  Venous Dopplers negative             -antiplatelet therapy: continue Aspirin 325 mg daily x 2 weeks-should be completing regimen at discharge   3. Pain Management: continue Neurontin 100 mg 3 times daily Robaxin 500 mg 3 times daily, Naprosyn 500 mg twice daily, oxycodone as needed  -2-27: Poor pain control interrupting sleep and very limiting for therapy progress.  Increase regimen to scheduled Tylenol 1000 mg 3 times daily, Robaxin 750 mg 3 times daily, and scheduled oxycodone 5 mg 3 times daily in addition to 5 mg every 4 hours as needed.  2-28: Improvement in pain control with the above measures, does well with premedicating therapies with PRNs.  Increase nightly gabapentin to 300 mg, can consider switching scheduled oxycodone to long-acting OxyContin 10 mg twice daily over the weekend if no improvement (discussed this with mother/therapies, patient will endorse severe pain regardless)  3/3: Using PRN oxycodone overnight consistently  3-5: Used decreasing to once daily; can likely reduce standing in 1 to 2 days  3-6: Last as needed used 3-4.  Will DC standing oxycodone  3-7: Voltaren to L hip QID. DC naprosyn 500 mg BID due to risk of bleeding and peripheral edema   3/8-3/9- again today, - said pain is controlled today- focused on bathroom  3-11: Reduce pain regimen, tramadol 50 mg every 6 hours as needed for moderate pain, oxycodone 5 mg every 6 hours as needed for severe  pain.  Add Voltaren gel to the dorsal left foot.  3/12: Ice pack with missing Velcro; no orthopedic orders for product, unsure if it can be replaced in the hospital.  Will look into supply for this.    4. Mood/Behavior/Sleep: Provide emotional support             -antipsychotic agents: N/A  -2-27: Poor sleep due to right leg pain.  Pain control as above, add as needed melatonin  3-3: Sleep remains intermittently interrupted, able to go back to sleep with pain medication.--Resolved   5. Neuropsych/cognition: This patient is not capable of making decisions on her own behalf.   6. Skin/Wound Care/RASH new 2/28: Routine skin checks  -2-28: Nursing checking with mom regarding any prior medication reactions.  No recently started medications (noticed before AM lasix).  Continue as needed Benadryl.  3/3: Rash appears resolved  3/4: Itching from moisture in extension brace; remove when in bed--resolved  3/7: Edema management on LLE with ACE wrap when OOB--patient has not been getting, per therapies refusing  3-12: Is approaching 3 weeks postop, suture starting to embed, will remove today so no complication while awaiting orthopedics follow-up.   7. Fluids/Electrolytes/Nutrition: Routine in and outs with follow-up chemistries    - K 5.9, Na 132, BUN 62 - IVF at 100 cc/hr 2/26  - 2-28: BUN/creatinine improved with IV fluids, remains hyperglycemic and with potassium up to 6.0; increase insulin as below, continue IV fluids, give Lasix 40 mg IV today and retest potassium this afternoon.   -- Responsive to Lasix, repeat 5.1. Stop IVF.  Labs in AM ordered - 3/1: K+ reviewed and normalized   - 3/3: BMP with decreased sodium 129; has been slightly low around 132.  Otherwise, stable. 3/4: Na improved, K stable 3-6: BMP stable, continue current regimen  8.  Prader-Willi syndrome.  Patient lives with parents independent prior to admission   -Will get  behavioral plan in place to assist with staff understanding  patient's cognitive baseline and motivation; per mom, does well with food motivators and puzzles/coloring books.  Would use food sparingly given uncontrolled diabetes.   9.  Diabetes mellitus.  Hemoglobin A1c 9.5.  continue NovoLog 6 units 3 times daily, Semglee 55 units nightly.  Glycemia likely complicated by poor pain control   - home regimen per chart review endocrinology 06/22/23: Basal insulin: Continue Lantus to 50 units daily Prandial insulin: Continue Novolog 12 units with meals Correctional insulin: Continue Novolog 2: 50 > 150 with meals and the half dose before bed Non-insulin agents: - tirzepatide 5 mg weekly, increase as able (is currently finishing out their last ozempic pen before switching) - Continue metformin ER 1000 mg bid - Continue empagliflozin 25 mg daily    - Per mom, baseline blood sugar between 150 and 300    - Was switched to semglee 32 U BID yesterday; IV fluids today   2/28: Resume home metformin, hold Jardiance due to hyperkalemia, increase Lantus to 45 units twice daily  Regimen adjusted as per diabetic coordinator recommendations-- 13U premeal + SSI + 50U lantis BID  3/3: BG remains elevated; increase lantus to 60 U BID, premeal to 15 U TID--improved this afternoon.  If potassium remains stable and sodium improved in a.m. labs, will resume Jardiance-- 3/4: K stable, ordered Jardiance 25 mg daily 3-5: Blood glucose down today, with reduction in pain and resumption of home medications concern for overtreatment; switch Premeal insulin to home 12 units 3 times daily, and long-acting Semglee to 30 units twice daily.  Okay to go a little high to avoid overnight lows, as she generally is lowest first thing in the morning.  Will titrate long-acting further based on a.m. reads. 3/6: Blood sugars in normal range with adjusted dosing  3/7: Will transition back to home Semglee every morning only at 60 units 3-8  3/8-11 better BG control- con't regimen for now Recent Labs     09/14/23 1612 09/14/23 2118 09/15/23 0704  GLUCAP 174* 102* 153*     10.  Hypertension.  Continue Lisinopril 40 mg daily, Toprol-XL 25 mg daily .  -Normotensive to slightly low    09/14/2023    7:40 PM 09/14/2023    3:31 PM 09/14/2023    3:15 AM  Vitals with BMI  Systolic 124 122 865  Diastolic 84 76 90  Pulse 93 87 86    11.  Morbid obesity.  BMI 44.88.  Dietary follow-up.  Complicated by significant food motivation due to Prader-Willi syndrome.   12.  Bilateral lower extremity edema.  Will get duplexes today; if negative, will get Ace wrap for left lower extremity and elevate in bed.  - 3/7: Patient received 1x dose Eliquis 10 mg before final duplex read as no DVT LLE; patient did not allow study to be done on right   - DC eliquis, resume prior Aspirin 325 mg daily 3/8   - Lasix 20 mg x1 for edema   - ACE wrap to LLE when OOB for swelling  3-11: Bilateral lower extremity swelling remains persistent, encourage use of Ace wrap as above--patient refusing  13.  Bladder incontinence.   - Ongoing, complicated by mobility deficit from orthopedic injury  -Will write for incontinence supplies for home to prevent skin breakdown/wounds  LOS: 14 days A FACE TO FACE EVALUATION WAS PERFORMED  Angelina Sheriff 09/15/2023, 8:48 AM

## 2023-09-15 NOTE — Progress Notes (Signed)
 Occupational Therapy Session Note  Patient Details  Name: Linda Mason [facility] MRN: [id number] Date of Birth: May 12, 1996  Today's Date: 09/15/2023 OT Individual Time: 4098-1191 OT Individual Time Calculation (min): 55 min    Short Term Goals: Week 1:  OT Short Term Goal 1 (Week 1): Pt will complete sit > stand in prep for ADL with min A using LRAD OT Short Term Goal 1 - Progress (Week 1): Met OT Short Term Goal 2 (Week 1): Pt will complete 1/3 toileting steps with CGA for balance OT Short Term Goal 2 - Progress (Week 1): Met OT Short Term Goal 3 (Week 1): Pt will complete toilet transfer with mod A using LRAD OT Short Term Goal 3 - Progress (Week 1): Met Week 2:  OT Short Term Goal 1 (Week 2): STG = LTG (d/t ELOS)  Skilled Therapeutic Interventions/Progress Updates:    1:1 Participated in ADL retraining at shower level. PT received in the w/c. Pt ambulated to the bathroom with RW with contact guard and doffed clothing sitting on the tub bench facing outward. Pt could recall her precautions about her right knee. Pt did require A to thread and unthread pants and brief today. Pt was able to performed sit to stands with contact guard but continues to prefer to place both hands on the w/c to come up instead of pushing up from the chair arm rest. Pt sat in w/c ot dress in the bathroom and then ambulated to the sink and sat in w/c to perform grooming. Pt left sitting up in w/c with her mom present.   Therapy Documentation Precautions:  Precautions Precautions: Knee Precaution Booklet Issued: No Recall of Precautions/Restrictions: Impaired Precaution/Restrictions Comments: Prader-willi syndrome Required Braces or Orthoses: Knee Immobilizer - Right Knee Immobilizer - Right: On when out of bed or walking Restrictions Weight Bearing Restrictions Per Provider Order: Yes RLE Weight Bearing Per Provider Order: Weight bearing as tolerated  Pain: No c/o pain   Therapy/Group: Individual  Therapy  Roney Mans [facility] 09/15/2023, 1:40 PM

## 2023-09-15 NOTE — Progress Notes (Signed)
 Patient ID: Aqua Denslow [facility], female   DOB: Sep 09, 1995, 28 y.o.   MRN: [id number]   SW faxed outpatient PT/OT referral to [facility].   SW faxed PCS referral to [facility] (p:[phone]/f:[phone]). *reports this form needs to go to [facility]. SW faxed form to [facility] [phone]).   SW faxed incontinence referral to [employer] (p:[phone]/f:[phone]).  SW provided letter to pt mother for transportation needs.   Cecile Sheerer, MSW, LCSW Office: [phone] Cell: [phone] Fax: [phone]

## 2023-09-16 ENCOUNTER — Other Ambulatory Visit (HOSPITAL_COMMUNITY): Payer: Self-pay

## 2023-09-16 LAB — GLUCOSE, CAPILLARY: Glucose-Capillary: 127 mg/dL — ABNORMAL HIGH (ref 70–99)

## 2023-09-16 NOTE — Telephone Encounter (Signed)
 Pharmacy Patient Advocate Encounter  Received notification from [facility] that Prior Authorization for  traMADol HCl 50MG  tablets  has been APPROVED from 09/15/2023 to 03/17/2024   PA #/Case ID/Reference #: [id number]

## 2023-09-16 NOTE — Plan of Care (Signed)
  Problem: RH Balance Goal: LTG Patient will maintain dynamic standing balance (PT) Description: LTG:  Patient will maintain dynamic standing balance with assistance during mobility activities (PT) Outcome: Completed/Met   Problem: Sit to Stand Goal: LTG:  Patient will perform sit to stand with assistance level (PT) Description: LTG:  Patient will perform sit to stand with assistance level (PT) Outcome: Completed/Met   Problem: RH Bed Mobility Goal: LTG Patient will perform bed mobility with assist (PT) Description: LTG: Patient will perform bed mobility with assistance, with/without cues (PT). Outcome: Completed/Met   Problem: RH Bed to Chair Transfers Goal: LTG Patient will perform bed/chair transfers w/assist (PT) Description: LTG: Patient will perform bed to chair transfers with assistance (PT). Outcome: Completed/Met   Problem: RH Car Transfers Goal: LTG Patient will perform car transfers with assist (PT) Description: LTG: Patient will perform car transfers with assistance (PT). Outcome: Completed/Met   Problem: RH Ambulation Goal: LTG Patient will ambulate in home environment (PT) Description: LTG: Patient will ambulate in home environment, # of feet with assistance (PT). Outcome: Completed/Met   Problem: RH Wheelchair Mobility Goal: LTG Patient will propel w/c in home environment (PT) Description: LTG: Patient will propel wheelchair in home environment, # of feet with assistance (PT). Outcome: Completed/Met

## 2023-09-16 NOTE — Progress Notes (Signed)
 PROGRESS NOTE   Subjective/Complaints:  Patient and mother seen at bedside, no questions or concerns regarding discharge today.  Are asking for incontinence supplies ordered.  Discussed expectations for transit back to school, mom says that bus is [employer] Access[employer] and she will be able to use her wheelchair.  Mom asked about diuretics for bilateral lower extremity edema; emphasized the need for keeping legs elevated and wrapping.  She is agreeable to this.  ROS:   Pt denies SOB, abd pain, CP, N/V/C/D, and vision change + LLE swelling--ongoing + RLE pain - ongoing  Objective:   No results found.  No results for input(s): "WBC", "HGB", "HCT", "PLT" in the last 72 hours.  No results for input(s): "NA", "K", "CL", "CO2", "GLUCOSE", "BUN", "CREATININE", "CALCIUM" in the last 72 hours.   Intake/Output Summary (Last 24 hours) at 09/16/2023 0836 Last data filed at 09/15/2023 1800 Gross per 24 hour  Intake 476 ml  Output --  Net 476 ml        Physical Exam: Vital Signs Blood pressure 139/85, pulse 81, temperature 97.7 F (36.5 C), resp. rate 18, weight 107.2 kg, SpO2 92%.  General: awake, alert, appropriate, sitting up in bedside chair.  No apparent distress. HENT: conjugate gaze; oropharynx moist CV: regular rate and rhythm; no JVD Pulmonary: CTA B/L; no W/R/R- good air movement GI: soft, NT, ND, (+)BS Psychiatric: appropriate mood and affect; becomes distressed when discussing painful activities like ambulating or removing sutures.  Neurological: alert, oriented x 4. Extremities: 2+ bilateral lower extremity edema.--Stable from prior exams  Skin: C/D/I   No obvious skin breakdown.--Prior embedded sutures removed, skin looks good, healing    MSK:     Apparent deformities.  Right lower extremity extension brace donned + No further TTP on bilateral dorsal feet, calves, and thighs.  Neurologic exam:  Cognition: AAO  to person, place, time and event.  Follows all simple commands. + Baseline higher-level cognitive deficits in insight and attention  Insight: Poor insight into current condition.  Mood: Pleasant affect, appropriate mood.  Sensation, reflexes, cranial nerves, coordination, and tone without apparent deficits.        Strength:                RUE: 5/5 SA, 5/5 EF, 5/5 EE, 5/5 WE, 5/5 FF, 5/5 FA                 LUE: 5/5 SA, 5/5 EF, 5/5 EE, 5/5 WE, 5/5 FF, 5/5 FA                 RLE: Limited due to extension brace; 3/5 HF, 4/5 DF, 4/5 PF-limited by pain                LLE:  4/5 HF, 4/5 KE, 4/5 DF, 5/5 EHL, 4/5 PF  --limited by pain, unchanged 3-13     Assessment/Plan: 1. Functional deficits which require 3+ hours per day of interdisciplinary therapy in a comprehensive inpatient rehab setting. Physiatrist is providing close team supervision and 24 hour management of active medical problems listed below. Physiatrist and rehab team continue to assess barriers to discharge/monitor patient progress toward functional and medical goals  Care Tool:  Bathing    Body parts bathed by patient: Right arm, Left arm, Chest, Abdomen, Front perineal area, Left upper leg, Face, Buttocks   Body parts bathed by helper: Left lower leg Body parts n/a: Right lower leg   Bathing assist Assist Level: Minimal Assistance - Patient > 75%     Upper Body Dressing/Undressing Upper body dressing   What is the patient wearing?: Pull over shirt, Bra    Upper body assist Assist Level: Set up assist    Lower Body Dressing/Undressing Lower body dressing      What is the patient wearing?: Incontinence brief, Pants     Lower body assist Assist for lower body dressing: Moderate Assistance - Patient 50 - 74%     Toileting Toileting    Toileting assist Assist for toileting: Minimal Assistance - Patient > 75%     Transfers Chair/bed transfer  Transfers assist     Chair/bed transfer assist level:  Supervision/Verbal cueing     Locomotion Ambulation   Ambulation assist   Ambulation activity did not occur: Safety/medical concerns  Assist level: Supervision/Verbal cueing Assistive device: Walker-rolling Max distance: 72'   Walk 10 feet activity   Assist  Walk 10 feet activity did not occur: Safety/medical concerns  Assist level: Supervision/Verbal cueing Assistive device: Walker-rolling   Walk 50 feet activity   Assist Walk 50 feet with 2 turns activity did not occur: Safety/medical concerns  Assist level: Supervision/Verbal cueing Assistive device: Walker-rolling    Walk 150 feet activity   Assist Walk 150 feet activity did not occur: Safety/medical concerns         Walk 10 feet on uneven surface  activity   Assist Walk 10 feet on uneven surfaces activity did not occur: Safety/medical concerns         Wheelchair     Assist Is the patient using a wheelchair?: Yes Type of Wheelchair: Manual    Wheelchair assist level: Supervision/Verbal cueing Max wheelchair distance: 150    Wheelchair 50 feet with 2 turns activity    Assist        Assist Level: Supervision/Verbal cueing   Wheelchair 150 feet activity     Assist      Assist Level: Supervision/Verbal cueing   Blood pressure 139/85, pulse 81, temperature 97.7 F (36.5 C), resp. rate 18, weight 107.2 kg, SpO2 92%.  Medical Problem List and Plan: 1. Functional deficits secondary to right knee patellar dislocation.  Status post right knee arthroscopic assisted patellar reduction with lateral release.  Right knee arthroscopic loose body removal right knee open management of lateral femoral condyle and medial patella fracture 08/27/2023.  Weightbearing as tolerated.  Knee immobilizer when out of bed or when ambulating.             -patient may shower             -ELOS/Goals: 10-14 days MinA/S- 09/15/33              Con't CIR    - 3/3: Will need WC and walker for home; OP  therapies only. Making progress but limited by poor pain tolerance.   - 3/11: Car simulator today, went well, working towards discharge goals  - 3/12: Will provide letter to mother of accommodations needed for bus and daycare/school program after discharge  3-13: Wrote order for incontinence supplies at home; this can be picked up by PCP at [facility] follow-up   The patient is medically ready for discharge to home and  will not need follow-up with [facility] PM[facility]. In addition, they will need to follow up with their PCP, Orthopedics.   2.  Antithrombotics: -DVT/anticoagulation:  Mechanical: Antiembolism stockings, thigh (TED hose) Bilateral lower extremities.  Venous Dopplers negative             -antiplatelet therapy: continue Aspirin 325 mg daily x 2 weeks-should be completing regimen at discharge   3. Pain Management: continue Neurontin 100 mg 3 times daily Robaxin 500 mg 3 times daily, Naprosyn 500 mg twice daily, oxycodone as needed  -2-27: Poor pain control interrupting sleep and very limiting for therapy progress.  Increase regimen to scheduled Tylenol 1000 mg 3 times daily, Robaxin 750 mg 3 times daily, and scheduled oxycodone 5 mg 3 times daily in addition to 5 mg every 4 hours as needed.  2-28: Improvement in pain control with the above measures, does well with premedicating therapies with PRNs.  Increase nightly gabapentin to 300 mg, can consider switching scheduled oxycodone to long-acting OxyContin 10 mg twice daily over the weekend if no improvement (discussed this with mother/therapies, patient will endorse severe pain regardless)  3/3: Using PRN oxycodone overnight consistently  3-5: Used decreasing to once daily; can likely reduce standing in 1 to 2 days  3-6: Last as needed used 3-4.  Will DC standing oxycodone  3-7: Voltaren to L hip QID. DC naprosyn 500 mg BID due to risk of bleeding and peripheral edema   3/8-3/9- again today, - said pain is controlled today- focused on bathroom  3-11: Reduce  pain regimen, tramadol 50 mg every 6 hours as needed for moderate pain, oxycodone 5 mg every 6 hours as needed for severe pain.  Add Voltaren gel to the dorsal left foot.  3/12: Ice pack with missing Velcro; no orthopedic orders for product, unsure if it can be replaced in the hospital.  Unable to find supplier for this, will inquire with plies prior to discharge.   4. Mood/Behavior/Sleep: Provide emotional support             -antipsychotic agents: N/A  -2-27: Poor sleep due to right leg pain.  Pain control as above, add as needed melatonin  3-3: Sleep remains intermittently interrupted, able to go back to sleep with pain medication.--Resolved   5. Neuropsych/cognition: This patient is not capable of making decisions on her own behalf.   6. Skin/Wound Care/RASH new 2/28: Routine skin checks  -2-28: Nursing checking with mom regarding any prior medication reactions.  No recently started medications (noticed before AM lasix).  Continue as needed Benadryl.  3/3: Rash appears resolved  3/4: Itching from moisture in extension brace; remove when in bed--resolved  3/7: Edema management on LLE with ACE wrap when OOB--patient has not been getting, per therapies refusing  3-12: Is approaching 3 weeks postop, suture starting to embed, will remove today so no complication while awaiting orthopedics follow-up.   3-13: Sutures removed, appears healing, tolerated well  7. Fluids/Electrolytes/Nutrition: Routine in and outs with follow-up chemistries    - K 5.9, Na 132, BUN 62 - IVF at 100 cc/hr 2/26  - 2-28: BUN/creatinine improved with IV fluids, remains hyperglycemic and with potassium up to 6.0; increase insulin as below, continue IV fluids, give Lasix 40 mg IV today and retest potassium this afternoon.   -- Responsive to Lasix, repeat 5.1. Stop IVF.  Labs in AM ordered - 3/1: K+ reviewed and normalized   - 3/3: BMP with decreased sodium 129; has been slightly low around 132.  Otherwise, stable. 3/4: Na  improved, K stable 3-6: BMP stable, continue current regimen  8.  Prader-Willi syndrome.  Patient lives with parents independent prior to admission   -Will get behavioral plan in place to assist with staff understanding patient's cognitive baseline and motivation; per mom, does well with food motivators and puzzles/coloring books.  Would use food sparingly given uncontrolled diabetes.   9.  Diabetes mellitus.  Hemoglobin A1c 9.5.  continue NovoLog 6 units 3 times daily, Semglee 55 units nightly.  Glycemia likely complicated by poor pain control   - home regimen per chart review endocrinology 06/22/23: Basal insulin: Continue Lantus to 50 units daily Prandial insulin: Continue Novolog 12 units with meals Correctional insulin: Continue Novolog 2: 50 > 150 with meals and the half dose before bed Non-insulin agents: - tirzepatide 5 mg weekly, increase as able (is currently finishing out their last ozempic pen before switching) - Continue metformin ER 1000 mg bid - Continue empagliflozin 25 mg daily    - Per mom, baseline blood sugar between 150 and 300    - Was switched to semglee 32 U BID yesterday; IV fluids today   2/28: Resume home metformin, hold Jardiance due to hyperkalemia, increase Lantus to 45 units twice daily  Regimen adjusted as per diabetic coordinator recommendations-- 13U premeal + SSI + 50U lantis BID  3/3: BG remains elevated; increase lantus to 60 U BID, premeal to 15 U TID--improved this afternoon.  If potassium remains stable and sodium improved in a.m. labs, will resume Jardiance-- 3/4: K stable, ordered Jardiance 25 mg daily 3-5: Blood glucose down today, with reduction in pain and resumption of home medications concern for overtreatment; switch Premeal insulin to home 12 units 3 times daily, and long-acting Semglee to 30 units twice daily.  Okay to go a little high to avoid overnight lows, as she generally is lowest first thing in the morning.  Will titrate long-acting  further based on a.m. reads. 3/6: Blood sugars in normal range with adjusted dosing  3/7: Will transition back to home Semglee every morning only at 60 units 3-8  3/8-11 better BG control- con't regimen for now Recent Labs    09/15/23 1653 09/15/23 2138 09/16/23 0627  GLUCAP 155* 133* 127*     10.  Hypertension.  Continue Lisinopril 40 mg daily, Toprol-XL 25 mg daily .  -Normotensive to slightly low    09/16/2023    6:26 AM 09/15/2023    8:28 PM 09/15/2023    1:57 PM  Vitals with BMI  Systolic 139 128 578  Diastolic 85 87 81  Pulse 81 87 85    11.  Morbid obesity.  BMI 44.88.  Dietary follow-up.  Complicated by significant food motivation due to Prader-Willi syndrome.   12.  Bilateral lower extremity edema.  Will get duplexes today; if negative, will get Ace wrap for left lower extremity and elevate in bed.  - 3/7: Patient received 1x dose Eliquis 10 mg before final duplex read as no DVT LLE; patient did not allow study to be done on right   - DC eliquis, resume prior Aspirin 325 mg daily 3/8   - Lasix 20 mg x1 for edema   - ACE wrap to LLE when OOB for swelling  3-11: Bilateral lower extremity swelling remains persistent, encourage use of Ace wrap as above--patient refusing  2-13: Reinforced need with mom for leg elevation and when in bed and use of Ace bandages for wrapping when out of bed/dependent.  She is understanding.  13.  Bladder incontinence.   - Ongoing, complicated by mobility deficit from orthopedic injury  -Will write for incontinence supplies for home to prevent skin breakdown/wounds  LOS: 15 days A FACE TO FACE EVALUATION WAS PERFORMED  Angelina Sheriff 09/16/2023, 8:36 AM

## 2023-09-17 NOTE — Progress Notes (Signed)
 Inpatient Rehabilitation Care Coordinator Discharge Note   Patient Details  Name: Linda Mason MRN: [id number] Date of Birth: [birth year]   Discharge location: D/c to home with parents  Length of Stay: 14 days  Discharge activity level: WC and short distance ambulatory  level Supervision  Home/community participation: Limited  Patient response MV:HQIONG Literacy - How often do you need to have someone help you when you read instructions, pamphlets, or other written material from your doctor or pharmacy?: Sometimes  Patient response EX:BMWUXL Isolation - How often do you feel lonely or isolated from those around you?: Never  Services provided included: RD, MD, PT, OT, RN, CM, TR, Pharmacy, Neuropsych, SW  Financial Services:  Field seismologist Utilized: Private Insurance [facility] Tailored Plan  Choices offered to/list presented to: patient mother  Follow-up services arranged:  Outpatient, DME    Outpatient Servicies: [facility] [facility] for outpatient PT/OT DME : [employer] for w/c, youth RWm and 3in1 BSC. [employer] for pullups/chux.    Patient response to transportation need: Is the patient able to respond to transportation needs?: Yes In the past 12 months, has lack of transportation kept you from medical appointments or from getting medications?: No In the past 12 months, has lack of transportation kept you from meetings, work, or from getting things needed for daily living?: No   Patient/Family verbalized understanding of follow-up arrangements:  Yes  Individual responsible for coordination of the follow-up plan: Contcat pt mother Darral Dash  Confirmed correct DME delivered: Gretchen Short 09/17/2023    Comments (or additional information):fam edu completed  Summary of Stay    Date/Time Discharge Planning CSW  09/14/23 1046 Pt will discharge to home with her mother providing 24/7 care. Mother would like for her daughter to be as independent as  possible. Pt only eligible for outpatient therapies as no HHA is in network with insurance. SW will confirm there are no barriers to discharge. AAC  09/07/23 0922 Pt will discharge to home with her mother providing 24/7 care. Mother would like for her daughter to be as independent as possible. Pt only eligible for outpatient therapies as no HHA is in network with insurance. SW will confirm there are no barriers to discharge. AAC       Reyli Schroth A Lula Olszewski

## 2023-09-28 ENCOUNTER — Ambulatory Visit: Payer: MEDICAID | Admitting: Physical Therapy

## 2023-09-28 ENCOUNTER — Ambulatory Visit: Payer: MEDICAID

## 2023-10-14 ENCOUNTER — Ambulatory Visit: Payer: MEDICAID | Admitting: Physical Therapy

## 2023-10-14 ENCOUNTER — Ambulatory Visit: Payer: MEDICAID | Attending: Physician Assistant | Admitting: Occupational Therapy

## 2023-10-14 ENCOUNTER — Encounter: Payer: Self-pay | Admitting: Occupational Therapy

## 2023-10-14 DIAGNOSIS — G8929 Other chronic pain: Secondary | ICD-10-CM | POA: Insufficient documentation

## 2023-10-14 DIAGNOSIS — M25561 Pain in right knee: Secondary | ICD-10-CM | POA: Diagnosis present

## 2023-10-14 DIAGNOSIS — S83004A Unspecified dislocation of right patella, initial encounter: Secondary | ICD-10-CM | POA: Diagnosis present

## 2023-10-14 DIAGNOSIS — M6281 Muscle weakness (generalized): Secondary | ICD-10-CM | POA: Insufficient documentation

## 2023-10-14 DIAGNOSIS — R278 Other lack of coordination: Secondary | ICD-10-CM | POA: Insufficient documentation

## 2023-10-14 DIAGNOSIS — R262 Difficulty in walking, not elsewhere classified: Secondary | ICD-10-CM | POA: Insufficient documentation

## 2023-10-14 DIAGNOSIS — R2681 Unsteadiness on feet: Secondary | ICD-10-CM | POA: Insufficient documentation

## 2023-10-14 NOTE — Therapy (Signed)
 OUTPATIENT OCCUPATIONAL THERAPY ORTHO EVALUATION  Patient Name: Linda Mason MRN: [id number] DOB:1996/01/24, 28 y.o., female Today's Date: 10/14/2023  [facility] REFERRING PROVIDER: Mariam Dollar  END OF SESSION:  OT End of Session - 10/14/23 1601     Visit Number 1    Number of Visits 24    Date for OT Re-Evaluation 01/06/24    OT Start Time 1445    OT Stop Time 1530    OT Time Calculation (min) 45 min    Equipment Utilized During Treatment wheelchair    Activity Tolerance Patient tolerated treatment well    Behavior During Therapy [facility] for tasks assessed/performed             Past Medical History:  Diagnosis Date   Diabetes mellitus without complication (HCC)    Past Surgical History:  Procedure Laterality Date   KNEE ARTHROSCOPY Right 08/27/2023   Procedure: ARTHROSCOPY KNEE;  Surgeon: Signa Kell, MD;  Location: [facility] ORS;  Service: Orthopedics;  Laterality: Right;   Patient Active Problem List   Diagnosis Date Noted   Closed patellar dislocation, right, initial encounter 09/01/2023   Patellar dislocation 08/26/2023   Type 2 diabetes mellitus without complication, with long-term current use of insulin (HCC) 08/26/2023   Hypoxia 08/26/2023   Morbid obesity (HCC) 10/09/2021   Diabetes mellitus with neuropathy (HCC) 10/09/2021   Essential hypertension 10/09/2021   Thrombocytopenia (HCC) 10/09/2021   Hypothyroidism 10/09/2021   Prader-Willi syndrome 10/09/2021   Cellulitis 10/08/2021    ONSET DATE: 08/26/2023  REFERRING DIAG: Right knee patella fracture  THERAPY DIAG:  Closed patellar dislocation, right, initial encounter  Muscle weakness (generalized)  Rationale for Evaluation and Treatment: Rehabilitation  SUBJECTIVE:   SUBJECTIVE STATEMENT: Pt reports she is nervous about therapy Pt accompanied by: family member and interpreter: [name] - Spanish interpreter  PERTINENT HISTORY: Linda Mason is a  28 year old right-handed morbidly obese female and BMI 44.88 as well as diabetes mellitus, Prader-Willi syndrome, intellectual delay. Presented to [facility] ED on 08/26/2023 after mechanical fall getting out of her bed. X-rays and imaging revealed right knee irreducible patellar dislocation right knee lateral femoral condyle impacting fracture as well as right knee medial patella impaction fracture. Pt underwent right knee arthroscopic assisted patellar reduction with lateral release on 08/27/2023 per Dr. Signa Kell.  Weightbearing as tolerated right lower extremity knee immobilizer when out of bed or when walking. Pt admitted to CIR from 09/02/23 - 09/15/23  PRECAUTIONS: Knee , Knee Immobilizer    WEIGHT BEARING RESTRICTIONS: Yes WBAT  PAIN:  Are you having pain? Yes: NPRS scale: 4/10 Pain location: knee  FALLS: Has patient fallen in last 6 months? No  LIVING ENVIRONMENT: Lives with: lives with their family Lives in: House/apartment Stairs: No ramp Has following equipment at home: Wheelchair (manual)  PLOF: Independent with basic ADLs  PATIENT GOALS: to return to school and go to [employer] with no fear of falling   NEXT MD VISIT: none  OBJECTIVE:  Note: Objective measures were completed at Evaluation unless otherwise noted.  HAND DOMINANCE: Left  ADLs: Transfers/ambulation related to ADLs: SUPERVISION short distances using RW, MOD I using w/c Eating: IND Grooming: IND Upper body dressing: SETUP assist Lower body dressing: MAX A Toileting: SUPERVISION Bathing: MOD A bathing   Tub shower transfers: SUPERVISION Equipment: Shower seat with back, Walk in shower, and bed side commode  UPPER EXTREMITY ROM:     Active ROM Right  WFL Left  [facility]    UPPER  EXTREMITY MMT:   Question accuracy given difficulty following commands   MMT Right eval Left eval  Shoulder flexion 3+/5 3+/5  Shoulder abduction    Elbow flexion 3+/5 3+/5  Elbow extension 3+/5 3+/5  Wrist flexion    Wrist  extension    (Blank rows = not tested)  HAND FUNCTION: Grip strength: Right: 10 lbs; Left: 15 lbs, Lateral pinch: Right: 4 lbs, Left: 5 lbs, and 3 point pinch: Right: 1 lbs, Left: 1 lbs  COORDINATION: 9 Hole Peg test: Right: 40 sec; Left: 30 sec  SENSATION: Light touch: Impaired  tingling  EDEMA: none  COGNITION: Overall cognitive status: History of cognitive impairments - at baseline Areas of impairment: Areas of impairment: Problem solving  Commands: Follows one step commands with increased time Behavior: Perseveration and anxious  OBSERVATIONS: Pt appears anxious and looking toward her mother for answers to questions.    TREATMENT DATE: 10/14/23                                                                                                                              PATIENT EDUCATION: Education details: HEP Person educated: Parent Education method: Medical illustrator Education comprehension: verbalized understanding  HOME EXERCISE PROGRAM: Red theraputty and theraband provided - plan for Spanish handout next session  GOALS: Goals reviewed with patient? Yes    SHORT TERM GOALS: Target date: 11/24/23  Pt will INDEPENDENTLY complete HEP  Baseline: 10/14/23: no HEP issued Goal status: INITIAL    LONG TERM GOALS: Target date: 01/06/24  Pt will increase B strength by 15# to assist with gripping grab bar for safe transfers   Baseline: 10/14/23: L 15#, R 10#  Goal status: INITIAL  2.  Pt will INDEPENDENTLY transfer to regular height toilet  Baseline: SUPERVISION + RW for elevated toilet, MIN A + RW for regular toilet Goal status: INITIAL  3.  Pt will improve B FMC by decreasing 9 hole PEG test score by 10 seconds for participation in leisure activities (coloring).  Baseline: L 30", R 40" Goal status: INITIAL  4.  Pt will verbalize x3 strategies to manage anxiousness and fear of falling to reduce fall risk. Baseline: 0/3 strategies.  Goal status:  INITIAL  5.  Pt will increase B lateral pinch strength by 5# to manage ADL items.  Baseline: R 4#, L 5# Goal status: INITIAL   ASSESSMENT:  CLINICAL IMPRESSION: Patient is a 28 y.o. F who was seen today for occupational therapy evaluation for R patellar fracture and subsequent surgery. Pt recently discharged from CIR to home with her mother on 09/15/23. Pt presented today with mother and Spanish interpreter, pt requires increased time to sequence new tasks. Pt is unable to return to high school until she can toilet without assistance. Pt currently demonstrates deficits in B grip / pinch strength, dynamic balance, and safety awareness. Pt will benefit from OT services to improve B strength/dexterity and toilet t/fs for safe return to  school.    PERFORMANCE DEFICITS: in functional skills including ADLs, coordination, strength, pain, and balance, cognitive skills including attention and problem solving, and psychosocial skills including coping strategies and environmental adaptation.   IMPAIRMENTS: are limiting patient from ADLs and leisure.   COMORBIDITIES: has co-morbidities such as Prader-Willi syndrome, intellectual delay  that affect occupational performance. Patient will benefit from skilled OT to address above impairments and improve overall function.  MODIFICATION OR ASSISTANCE TO COMPLETE EVALUATION: Min-Moderate modification of tasks or assist with assess necessary to complete an evaluation.  OT OCCUPATIONAL PROFILE AND HISTORY: Detailed assessment: Review of records and additional review of physical, cognitive, psychosocial history related to current functional performance.  CLINICAL DECISION MAKING: Moderate - several treatment options, min-mod task modification necessary  REHAB POTENTIAL: Good  EVALUATION COMPLEXITY: Moderate      PLAN:  OT FREQUENCY: 1-2x/week  OT DURATION: 12 weeks  PLANNED INTERVENTIONS: 97535 self care/ADL training, 65784 therapeutic exercise,  97530 therapeutic activity, patient/family education, and DME and/or AE instructions  RECOMMENDED OTHER SERVICES: PT  CONSULTED AND AGREED WITH PLAN OF CARE: Patient and family member/caregiver  PLAN FOR NEXT SESSION: ADLs  Kathie Dike, M.S. OTR/L  10/14/23, 4:22 PM  ascom [phone]  Presley Raddle, OT 10/14/2023, 4:22 PM

## 2023-10-18 ENCOUNTER — Ambulatory Visit: Payer: MEDICAID

## 2023-10-18 ENCOUNTER — Ambulatory Visit: Payer: MEDICAID | Admitting: Occupational Therapy

## 2023-10-18 DIAGNOSIS — G8929 Other chronic pain: Secondary | ICD-10-CM

## 2023-10-18 DIAGNOSIS — M6281 Muscle weakness (generalized): Secondary | ICD-10-CM

## 2023-10-18 DIAGNOSIS — S83004A Unspecified dislocation of right patella, initial encounter: Secondary | ICD-10-CM | POA: Diagnosis not present

## 2023-10-18 DIAGNOSIS — R2681 Unsteadiness on feet: Secondary | ICD-10-CM

## 2023-10-18 DIAGNOSIS — R262 Difficulty in walking, not elsewhere classified: Secondary | ICD-10-CM

## 2023-10-18 NOTE — Therapy (Signed)
 OUTPATIENT OCCUPATIONAL THERAPY ORTHO TREATMENT  Patient Name: Linda Mason MRN: [id number] DOB:07-12-1995, 28 y.o., female Today's Date: 10/18/2023  PCP: [facility] REFERRING PROVIDER: Mariam Dollar  END OF SESSION:  OT End of Session - 10/18/23 1427     Visit Number 2    Number of Visits 24    Date for OT Re-Evaluation 01/06/24    OT Start Time 1315    OT Stop Time 1400    OT Time Calculation (min) 45 min    Activity Tolerance Patient tolerated treatment well    Behavior During Therapy [facility] for tasks assessed/performed             Past Medical History:  Diagnosis Date   Diabetes mellitus without complication (HCC)    Past Surgical History:  Procedure Laterality Date   KNEE ARTHROSCOPY Right 08/27/2023   Procedure: ARTHROSCOPY KNEE;  Surgeon: Signa Kell, MD;  Location: [facility] ORS;  Service: Orthopedics;  Laterality: Right;   Patient Active Problem List   Diagnosis Date Noted   Closed patellar dislocation, right, initial encounter 09/01/2023   Patellar dislocation 08/26/2023   Type 2 diabetes mellitus without complication, with long-term current use of insulin (HCC) 08/26/2023   Hypoxia 08/26/2023   Morbid obesity (HCC) 10/09/2021   Diabetes mellitus with neuropathy (HCC) 10/09/2021   Essential hypertension 10/09/2021   Thrombocytopenia (HCC) 10/09/2021   Hypothyroidism 10/09/2021   Prader-Willi syndrome 10/09/2021   Cellulitis 10/08/2021    ONSET DATE: 08/26/2023  REFERRING DIAG: Right knee patella fracture  THERAPY DIAG:  Muscle weakness (generalized)  Rationale for Evaluation and Treatment: Rehabilitation  SUBJECTIVE:   SUBJECTIVE STATEMENT: Pt. reports that she is nervous about transferring to toilets at school without using a walker. Pt accompanied by: family member and interpreter: [name] - Spanish interpreter  PERTINENT HISTORY: Linda Mason is a 28 year old right-handed morbidly obese female and BMI 44.88 as  well as diabetes mellitus, Prader-Willi syndrome, intellectual delay. Presented to [facility] ED on 08/26/2023 after mechanical fall getting out of her bed. X-rays and imaging revealed right knee irreducible patellar dislocation right knee lateral femoral condyle impacting fracture as well as right knee medial patella impaction fracture. Pt underwent right knee arthroscopic assisted patellar reduction with lateral release on 08/27/2023 per Dr. Signa Kell.  Weightbearing as tolerated right lower extremity knee immobilizer when out of bed or when walking. Pt admitted to CIR from 09/02/23 - 09/15/23  PRECAUTIONS: Knee , Knee Immobilizer    WEIGHT BEARING RESTRICTIONS: Yes WBAT  PAIN:  Are you having pain? Yes: NPRS scale: 0/10 Pain location: knee  FALLS: Has patient fallen in last 6 months? No  LIVING ENVIRONMENT: Lives with: lives with their family Lives in: House/apartment Stairs: No ramp Has following equipment at home: Wheelchair (manual)  PLOF: Independent with basic ADLs  PATIENT GOALS: to return to school and go to [employer] with no fear of falling   NEXT MD VISIT: none  OBJECTIVE:  Note: Objective measures were completed at Evaluation unless otherwise noted.  HAND DOMINANCE: Left  ADLs: Transfers/ambulation related to ADLs: SUPERVISION short distances using RW, MOD I using w/c Eating: IND Grooming: IND Upper body dressing: SETUP assist Lower body dressing: MAX A Toileting: SUPERVISION Bathing: MOD A bathing   Tub shower transfers: SUPERVISION Equipment: Shower seat with back, Walk in shower, and bed side commode  UPPER EXTREMITY ROM:     Active ROM Right  WFL Left  WFL    UPPER EXTREMITY MMT:   Question accuracy  given difficulty following commands   MMT Right eval Left eval  Shoulder flexion 3+/5 3+/5  Shoulder abduction    Elbow flexion 3+/5 3+/5  Elbow extension 3+/5 3+/5  Wrist flexion    Wrist extension    (Blank rows = not tested)  HAND FUNCTION: Grip  strength: Right: 10 lbs; Left: 15 lbs, Lateral pinch: Right: 4 lbs, Left: 5 lbs, and 3 point pinch: Right: 1 lbs, Left: 1 lbs  COORDINATION: 9 Hole Peg test: Right: 40 sec; Left: 30 sec  SENSATION: Light touch: Impaired  tingling  EDEMA: none  COGNITION: Overall cognitive status: History of cognitive impairments - at baseline Areas of impairment: Areas of impairment: Problem solving  Commands: Follows one step commands with increased time Behavior: Perseveration and anxious  OBSERVATIONS: Pt appears anxious and looking toward her mother for answers to questions.    TREATMENT DATE: 10/18/23  Therapeutic Ex:  -Bilateral progressive gross grip strengthening with 6.6# of grip strength resistive force. -Lateral, and 3pt. Pinch strengthening using yellow, red, green, and blue, and black level resistive clips with consistent verbal, and visual cues.                                                                                                              PATIENT EDUCATION: Education details: HEP Person educated: Parent Education method: Psychiatrist comprehension: verbalized understanding  HOME EXERCISE PROGRAM: Red theraputty and theraband provided - plan for Spanish handout next session  GOALS: Goals reviewed with patient? Yes    SHORT TERM GOALS: Target date: 11/24/23  Pt will INDEPENDENTLY complete HEP  Baseline: 10/14/23: no HEP issued Goal status: INITIAL  LONG TERM GOALS: Target date: 01/06/24  Pt will increase B strength by 15# to assist with gripping grab bar for safe transfers   Baseline: 10/14/23: L 15#, R 10#  Goal status: INITIAL  2.  Pt will INDEPENDENTLY transfer to regular height toilet  Baseline: SUPERVISION + RW for elevated toilet, MIN A + RW for regular toilet Goal status: INITIAL  3.  Pt will improve B FMC by decreasing 9 hole PEG test score by 10 seconds for participation in leisure activities (coloring).  Baseline: L 30",  R 40" Goal status: INITIAL  4.  Pt will verbalize x3 strategies to manage anxiousness and fear of falling to reduce fall risk. Baseline: 0/3 strategies.  Goal status: INITIAL  5.  Pt will increase B lateral pinch strength by 5# to manage ADL items.  Baseline: R 4#, L 5# Goal status: INITIAL   ASSESSMENT:  CLINICAL IMPRESSION:  The OT treatment session was late starting this afternoon 2/2 confusion with the interpreter services. Interpreter services were initially started with through [employer] with [place]ero Duck, and then provided with [place]. Goals were reviewed with the Pt./caregiver. Pt. requires frequent verbal cues, cues for visual demonstration, and assist for 3pt., and lateral pinch, and grip strengthening. Pt. continues  to benefit from OT services to improve bilateral  strength/dexterity and toilet t/fs for safe return to school.  PERFORMANCE DEFICITS: in functional skills including ADLs, coordination, strength, pain, and balance, cognitive skills including attention and problem solving, and psychosocial skills including coping strategies and environmental adaptation.   IMPAIRMENTS: are limiting patient from ADLs and leisure.   COMORBIDITIES: has co-morbidities such as Prader-Willi syndrome, intellectual delay  that affect occupational performance. Patient will benefit from skilled OT to address above impairments and improve overall function.  MODIFICATION OR ASSISTANCE TO COMPLETE EVALUATION: Min-Moderate modification of tasks or assist with assess necessary to complete an evaluation.  OT OCCUPATIONAL PROFILE AND HISTORY: Detailed assessment: Review of records and additional review of physical, cognitive, psychosocial history related to current functional performance.  CLINICAL DECISION MAKING: Moderate - several treatment options, min-mod task modification necessary  REHAB POTENTIAL: Good  EVALUATION COMPLEXITY: Moderate      PLAN:  OT FREQUENCY: 1-2x/week  OT  DURATION: 12 weeks  PLANNED INTERVENTIONS: 97535 self care/ADL training, 28413 therapeutic exercise, 97530 therapeutic activity, patient/family education, and DME and/or AE instructions  RECOMMENDED OTHER SERVICES: PT  CONSULTED AND AGREED WITH PLAN OF CARE: Patient and family member/caregiver  PLAN FOR NEXT SESSION: ADLs   10/18/2023, 2:37 PM

## 2023-10-18 NOTE — Therapy (Signed)
 OUTPATIENT PHYSICAL THERAPY LE EVALUATION   Patient Name: Evelise Reine MRN: [id number] DOB:1996-05-11, 28 y.o., female Today's Date: 10/18/2023  END OF SESSION:  PT End of Session - 10/18/23 1514     Visit Number 1    Number of Visits 25    Date for PT Re-Evaluation 01/10/24    PT Start Time 1403    PT Stop Time 1445    PT Time Calculation (min) 42 min    Equipment Utilized During Treatment Gait belt    Activity Tolerance Patient tolerated treatment well;Other (comment)   pt throughout reporting fear of movement   Behavior During Therapy WFL for tasks assessed/performed             Past Medical History:  Diagnosis Date   Diabetes mellitus without complication [facility])    Past Surgical History:  Procedure Laterality Date   KNEE ARTHROSCOPY Right 08/27/2023   Procedure: ARTHROSCOPY KNEE;  Surgeon: Signa Kell, MD;  Location: [facility] ORS;  Service: Orthopedics;  Laterality: Right;   Patient Active Problem List   Diagnosis Date Noted   Closed patellar dislocation, right, initial encounter 09/01/2023   Patellar dislocation 08/26/2023   Type 2 diabetes mellitus without complication, with long-term current use of insulin (HCC) 08/26/2023   Hypoxia 08/26/2023   Morbid obesity (HCC) 10/09/2021   Diabetes mellitus with neuropathy (HCC) 10/09/2021   Essential hypertension 10/09/2021   Thrombocytopenia (HCC) 10/09/2021   Hypothyroidism 10/09/2021   Prader-Willi syndrome 10/09/2021   Cellulitis 10/08/2021     [facility]  REFERRING PROVIDER: Charlton Amor, PA-C   REFERRING DIAG:  Diagnosis  S83.004A (ICD-10-CM) - Closed patellar dislocation, right, initial encounter    Rationale for Evaluation and Treatment: Rehabilitation  THERAPY DIAG:  Chronic pain of right knee  Muscle weakness (generalized)  Difficulty in walking, not elsewhere classified  Unsteadiness on feet  ONSET DATE: 08/26/2023   SUBJECTIVE:                                                                                                                                                                                            SUBJECTIVE STATEMENT  The pt is a pleasant 28 yo female. She presents in [facility] with her mom and an interpreter present for PT eval. Pt suffered a R knee dislocation following a fall 08/26/2023. Pt also found to have lateral femoral condyle impacting fx and medial patella impaction fracture of RLE. She underwent arthroscopic assisted R patellar reduction with lateral release on 08/27/2023. Pt admitted to CIR from 09/02/23 - 09/15/23/ Pt just had follow-up with surgeon and has been instructed to wean  from hinged knee brace/has been released from care; she is WBAT. Pt's mother reports pt using 2WW to ambulate for short distances: maybe ambulating 30-50 steps at most without brace. Pt reports ambulation is limited by R knee pain. Her knee hurts most when walking. She has difficulty completing STS but reports she can do this on her own, although not able to do this yet in her school's bathroom and would like to work on this. Pt was independent with ADLs prior to injury. Her mother is helping her now with dressing. She has not yet used steps to enter her home, is currently using her WC on ramp. They report no other falls. Pt wants to improve ability to ambulate for her school prom. She likes to watch movies.  Pt has Prader-Willi syndrome, intellectual delay.   PERTINENT HISTORY:   PMH also significant for DM II with neuropathy, HTN, thrombocytopenia, hypothyroidism, Prader-Willi syndrome.    PAIN:  Are you having pain?  None currently, takes medicine for her knee pain; no othe rpain  PRECAUTIONS:  Knee - WBAT  RED FLAGS: None   WEIGHT BEARING RESTRICTIONS:  Via surgeon note "1. I recommended the patient be weightbearing as tolerated and wean from the hinged knee brace."  FALLS:  Has patient fallen in last 6 months? No  LIVING  ENVIRONMENT: Lives with: lives with their family in one level home Stairs:  2 steps, handrails bilat  to enter home, but also has two ramps that she uses to enter home and is not currently using steps Has following equipment at home: RW  OCCUPATION:  Student   PLOF:  Independent  PATIENT GOALS:  Pt wants to walk to be able to go to school prom; she wants to be able to dance.  NEXT MD VISIT: Had follow-up today,  was released from care but can follow-up as needed.    OBJECTIVE:  Note: Objective measures were completed at Evaluation unless otherwise noted.  DIAGNOSTIC FINDINGS:  Via chart, note Novella Olive, MD "R Knee radiographs:  10/18/2023: 2 views (wheelchair bound lateral and oblique radiographs with suboptimal positioning) were obtained. Appropriate images cannot be obtained due to patient's hesitation/unwillingness to transfer to x-ray table. Within these limitations, there does not appear to be a recurrent patellar dislocation. There is significant osteopenia. Impaction fracture about the lateral femoral condyle is also visualized. It does not appear to fully involve the articular weightbearing surface. "  PATIENT SURVEYS:  LEFS deferred  COGNITIVE STATUS: Impaired at baseline   SENSATION: WFL BLE  COORDINATION: WFL rapid alt LE, unable to do heel>shin likely limited by body habitus and some hesitance/faer of movement  EDEMA:  Pt R knee still with slight edema around patella, no redness present, scars healing well  POSTURE:  rounded shoulders, increased fwd posture/kyphosis possibly impacted by body habitus    GAIT: Distance walked: (see below) Assistive device utilized: Environmental consultant - 2 wheeled Level of assistance: CGA Comments: Decreased gait speed, antalgic gait, decreased weightbearing and stance time on RLE, heavy BUE weightbearing through RW    Body Part #1 Knee  PALPATION: No pain with palpation superior, inferior, LTL patella, posterior knee  RLE  LOWER EXTREMITY ROM:    Need to complete formal assessment, however pt exhibited to have impaired R hip flexion mobility with MMT, possibly due positioning but needs further assessment.    LOWER EXTREMITY MMT:    MMT Right eval Left eval  Hip flexion 3- 3+  Hip extension  Hip abduction 4 4  Hip adduction 3* 4  Hip internal rotation    Hip external rotation    Knee flexion * 4  Knee extension 3* 4+  Ankle dorsiflexion 4 4  Ankle plantarflexion    Ankle inversion    Ankle eversion     (Blank rows = not tested) "*" indicates pain-limited. Pt also might be self-limiting due to fear of pain with movement     FUNCTIONAL TESTS:  5 times sit to stand: 42.5 sec heavy use of UE to RW  6 minute walk test: deferred tp future visit 10 meter walk test: 0.16 sec with BRW  Berg Balance Scale: deferred to future visit                                                                                                                        TREATMENT DATE:   TA: PT reviewed findings of RLE assessment, indications with pt and parent.  PT instructed pt in safe STS technique to RW as pt multiple times tried to pull on walker to stand exhibiting tipping of AD.   Reviewed: safe technique, signs and symptoms to watch for that proceeding is OK, stop if feeling increased pain/any sharp pain in knee. Pt completes STS in session to confirm correct technique. Access Code: [email or web address] URL: [email or web address] Date: 10/18/2023 Prepared by: Aminta Kales  Exercises - Sit to Stand with Counter Support  - 1 x daily - 7 x weekly - 2 sets - 5 reps    PATIENT EDUCATION:  Education details: assessment findings, goals, HEP, plan, safe technique with STS to walker Person educated: Patient and Parent Education method: Explanation, Demonstration, Tactile cues, Verbal cues, and Handouts Education comprehension: verbalized understanding, returned demonstration, verbal cues required, tactile  cues required, and needs further education  HOME EXERCISE PROGRAM: Reviewed: safe technique, signs and symptoms to watch for that proceeding is OK, stop if feeling increased pain/any sharp pain in knee Access Code: [email or web address] URL: [email or web address] Date: 10/18/2023 Prepared by: Aminta Kales  Exercises - Sit to Stand with Counter Support  - 1 x daily - 7 x weekly - 2 sets - 5 reps    ASSESSMENT:  CLINICAL IMPRESSION: Patient is a pleasant 28 y.o. female who was seen today for physical therapy evaluation and treatment s/p R knee fx and patellar reduction with lateral release surgery 08/27/2023. Exam findings indicate impairments of pain, strength, gait, mobility, and balance. Pt also reports fear of movement and completing certain PT activities throughout. PT provided frequent encouragement. HEP initiated but to be expanded next visit along with completing testing as pt is able. Currently deficits are impacting pt's mobility, and ability to complete ADLs and participate in school activities. The pt will benefit from further skilled PT to address impairments in order to decrease fall risk and pain, and to increase gait ability, mobility, balance, strength and QOL.    OBJECTIVE IMPAIRMENTS: Abnormal gait, decreased activity tolerance, decreased balance, decreased coordination, decreased endurance, decreased mobility,  difficulty walking, decreased ROM, decreased strength, increased edema, impaired flexibility, improper body mechanics, postural dysfunction, obesity, and pain.   ACTIVITY LIMITATIONS: carrying, lifting, bending, standing, squatting, stairs, transfers, toileting, dressing, and locomotion level  PARTICIPATION LIMITATIONS: meal prep, cleaning, laundry, shopping, community activity, yard work, and school  PERSONAL FACTORS: Fitness, Sex, and 3+ comorbidities: PMH significant for DM II with neuropathy, HTN, thrombocytopenia, hypothyroidism, Prader-Willi syndrome,   are also  affecting patient's functional outcome.   REHAB POTENTIAL: Good  CLINICAL DECISION MAKING: Evolving/moderate complexity  EVALUATION COMPLEXITY: Moderate   GOALS: Goals reviewed with patient? Yes  SHORT TERM GOALS: Target date: 11/29/2023    Patient will be independent in home exercise program to improve strength/mobility for better functional independence with ADLs. Baseline: initiated Goal status: INITIAL   LONG TERM GOALS: Target date: 01/10/2024   Patient will increase lower extremity functional scale to >60/80 to demonstrate improved functional mobility and increased tolerance with ADLs. .  Baseline: deferred to future session Goal status: INITIAL  2.  Patient (> 47 years old) will complete five times sit to stand test in < 15 seconds indicating an increased LE strength and improved balance. Baseline: 42.5 sec, heavy use of BUE Goal status: INITIAL  3.  Patient will increase Berg Balance score by > 6 points to demonstrate decreased fall risk during functional activities Baseline: deferred to future session, may need to complete different balance test if pt has difficulty with cuing for test activities  Goal status: INITIAL  4.  Patient will increase 10 meter walk test to >1.67m/s as to improve gait speed for better community ambulation and to reduce fall risk. Baseline: 0.16 with BRW Goal status: INITIAL  5.  Patient will increase six minute walk test distance to >1000 for progression to community ambulator and improve gait ability. Baseline:  Deferred to future visit Goal status: INITIAL    PLAN:  PT FREQUENCY: 1-2x/week  PT DURATION: 12 weeks  PLANNED INTERVENTIONS: 97164- PT Re-evaluation, 97750- Physical Performance Testing, 97110-Therapeutic exercises, 97530- Therapeutic activity, [id number]- Neuromuscular re-education, 97535- Self Care, [id number]- Manual therapy, [phone]- Gait training, [phone]- Orthotic Initial, [phone]- Orthotic/Prosthetic subsequent, [phone]- Canalith  repositioning, [id number]- Splinting, [id number]- Electrical stimulation (unattended), [phone]- Electrical stimulation (manual), [id number]- Ultrasound, Patient/Family education, Balance training, Stair training, Taping, Joint mobilization, Joint manipulation, Spinal mobilization, Scar mobilization, Vestibular training, DME instructions, Wheelchair mobility training, Cryotherapy, and Moist heat.  PLAN FOR NEXT SESSION: add to HEP, complete BERG, LEFs or as indicated/time permits   Samie Crews, PT 10/18/2023, 4:44 PM

## 2023-10-20 ENCOUNTER — Ambulatory Visit: Payer: MEDICAID | Admitting: Occupational Therapy

## 2023-10-20 ENCOUNTER — Ambulatory Visit: Payer: MEDICAID

## 2023-10-20 DIAGNOSIS — G8929 Other chronic pain: Secondary | ICD-10-CM

## 2023-10-20 DIAGNOSIS — M6281 Muscle weakness (generalized): Secondary | ICD-10-CM

## 2023-10-20 DIAGNOSIS — R2681 Unsteadiness on feet: Secondary | ICD-10-CM

## 2023-10-20 DIAGNOSIS — S83004A Unspecified dislocation of right patella, initial encounter: Secondary | ICD-10-CM

## 2023-10-20 DIAGNOSIS — R262 Difficulty in walking, not elsewhere classified: Secondary | ICD-10-CM

## 2023-10-20 NOTE — Therapy (Addendum)
 OUTPATIENT OCCUPATIONAL THERAPY ORTHO TREATMENT  Patient Name: Linda Mason MRN: [id number] DOB:[birth year], 28 y.o., female Today's Date: 10/20/2023  PCP: [facility] REFERRING PROVIDER: Mariam Dollar  END OF SESSION:  OT End of Session - 10/20/23 1712     Visit Number 3    Number of Visits 24    Date for OT Re-Evaluation 01/06/24    OT Start Time 1410    OT Stop Time 1445    OT Time Calculation (min) 35 min    Activity Tolerance Patient tolerated treatment well    Behavior During Therapy [facility] for tasks assessed/performed             Past Medical History:  Diagnosis Date   Diabetes mellitus without complication (HCC)    Past Surgical History:  Procedure Laterality Date   KNEE ARTHROSCOPY Right 08/27/2023   Procedure: ARTHROSCOPY KNEE;  Surgeon: Signa Kell, MD;  Location: [facility] ORS;  Service: Orthopedics;  Laterality: Right;   Patient Active Problem List   Diagnosis Date Noted   Closed patellar dislocation, right, initial encounter 09/01/2023   Patellar dislocation 08/26/2023   Type 2 diabetes mellitus without complication, with long-term current use of insulin (HCC) 08/26/2023   Hypoxia 08/26/2023   Morbid obesity (HCC) 10/09/2021   Diabetes mellitus with neuropathy (HCC) 10/09/2021   Essential hypertension 10/09/2021   Thrombocytopenia (HCC) 10/09/2021   Hypothyroidism 10/09/2021   Prader-Willi syndrome 10/09/2021   Cellulitis 10/08/2021    ONSET DATE: 08/26/2023  REFERRING DIAG: Right knee patella fracture  THERAPY DIAG:  Muscle weakness (generalized)  Rationale for Evaluation and Treatment: Rehabilitation  SUBJECTIVE:   SUBJECTIVE STATEMENT: Pt. reports doing okay today Pt accompanied by: family member and interpreter: [name] - Spanish interpreter  PERTINENT HISTORY: ADDELYNN Mason is a 28 year old right-handed morbidly obese female and BMI 44.88 as well as diabetes mellitus, Prader-Willi syndrome, intellectual  delay. Presented to [facility] ED on 08/26/2023 after mechanical fall getting out of her bed. X-rays and imaging revealed right knee irreducible patellar dislocation right knee lateral femoral condyle impacting fracture as well as right knee medial patella impaction fracture. Pt underwent right knee arthroscopic assisted patellar reduction with lateral release on 08/27/2023 per Dr. Signa Kell.  Weightbearing as tolerated right lower extremity knee immobilizer when out of bed or when walking. Pt admitted to CIR from 09/02/23 - 09/15/23  PRECAUTIONS: Knee , Knee Immobilizer    WEIGHT BEARING RESTRICTIONS: Yes WBAT  PAIN:  Are you having pain? Yes: NPRS scale: 0/10 Pain location: knee  FALLS: Has patient fallen in last 6 months? No  LIVING ENVIRONMENT: Lives with: lives with their family Lives in: House/apartment Stairs: No ramp Has following equipment at home: Wheelchair (manual)  PLOF: Independent with basic ADLs  PATIENT GOALS: to return to school and go to [employer] with no fear of falling   NEXT MD VISIT: none  OBJECTIVE:  Note: Objective measures were completed at Evaluation unless otherwise noted.  HAND DOMINANCE: Left  ADLs: Transfers/ambulation related to ADLs: SUPERVISION short distances using RW, MOD I using w/c Eating: IND Grooming: IND Upper body dressing: SETUP assist Lower body dressing: MAX A Toileting: SUPERVISION Bathing: MOD A bathing   Tub shower transfers: SUPERVISION Equipment: Shower seat with back, Walk in shower, and bed side commode  UPPER EXTREMITY ROM:     Active ROM Right  WFL Left  WFL    UPPER EXTREMITY MMT:   Question accuracy given difficulty following commands   MMT Right eval Left eval  Shoulder flexion 3+/5 3+/5  Shoulder abduction    Elbow flexion 3+/5 3+/5  Elbow extension 3+/5 3+/5  Wrist flexion    Wrist extension    (Blank rows = not tested)  HAND FUNCTION: Grip strength: Right: 10 lbs; Left: 15 lbs, Lateral pinch: Right: 4 lbs,  Left: 5 lbs, and 3 point pinch: Right: 1 lbs, Left: 1 lbs  COORDINATION: 9 Hole Peg test: Right: 40 sec; Left: 30 sec  SENSATION: Light touch: Impaired  tingling  EDEMA: none  COGNITION: Overall cognitive status: History of cognitive impairments - at baseline Areas of impairment: Areas of impairment: Problem solving  Commands: Follows one step commands with increased time Behavior: Perseveration and anxious  OBSERVATIONS: Pt appears anxious and looking toward her mother for answers to questions.    TREATMENT DATE: 10/20/23  Therapeutic Ex:  -Pt. Worked on BUE strengthening, and reciprocal motion using the UBE while seated for 8 min. With minimal resistance. Constant monitoring was provided.  -Performed BUE strengthening using a 2# dowel ex. 2/2 to weakness. Bilateral shoulder flexion, chest press, circular patterns, and elbow flexion/extension for 2 sets  10 reps each.  -Performed BUE strengthening using 1# hand weight for forearm supination/pronation, wrist flexion/extension, and radial deviation for 1 set 10 reps each.                                                                                                             PATIENT EDUCATION: Education details: UE functioning, toilet set-up at school. Person educated: Parent Education method: Medical illustrator Education comprehension: verbalized understanding  HOME EXERCISE PROGRAM:  Continue to assess, review, and provide as indicated  GOALS: Goals reviewed with patient? Yes    SHORT TERM GOALS: Target date: 11/24/23  Pt will INDEPENDENTLY complete HEP  Baseline: 10/14/23: no HEP issued Goal status: INITIAL  LONG TERM GOALS: Target date: 01/06/24  Pt will increase B strength by 15# to assist with gripping grab bar for safe transfers   Baseline: 10/14/23: L 15#, R 10#  Goal status: INITIAL  2.  Pt will INDEPENDENTLY transfer to regular height toilet  Baseline: SUPERVISION + RW for elevated toilet, MIN A +  RW for regular toilet Goal status: INITIAL  3.  Pt will improve B FMC by decreasing 9 hole PEG test score by 10 seconds for participation in leisure activities (coloring).  Baseline: L 30", R 40" Goal status: INITIAL  4.  Pt will verbalize x3 strategies to manage anxiousness and fear of falling to reduce fall risk. Baseline: 0/3 strategies.  Goal status: INITIAL  5.  Pt will increase B lateral pinch strength by 5# to manage ADL items.  Baseline: R 4#, L 5# Goal status: INITIAL   ASSESSMENT:  CLINICAL IMPRESSION:  Pt. was late to the session this afternoon. Pt. was able to tolerate UE strengthening with the 2# dowel, the hand weights, and the UBE well today. Pt. requires consistent cues and assist for proper form, and technique. UE ther. Ex. was performed to increase BUE strength needed for toileting transfers.  Reviewed with the Pt. And mother, Pt.'s Current toileting care functional status at home, which is modified independence with a walker. However, Pt. Will not have the walker with her at school. Pt. Will need to be able to use the grab bar to transfer herself from the w/c to the toilet with use of a grab bar. Pt. Reports being scared to use the bathroom at school, and being scared to attempt toilet transfers without a walker. Pt. Reports being agreeable to trying Sit to stand reps in the bathroom here using a grab bar to see how it goes, and slowly trying to progress to toilet transfers using the grab bar. Pt. continues to benefit from OT services to improve bilateral strength/dexterity and toilet t/f's for safe return to school.    PERFORMANCE DEFICITS: in functional skills including ADLs, coordination, strength, pain, and balance, cognitive skills including attention and problem solving, and psychosocial skills including coping strategies and environmental adaptation.   IMPAIRMENTS: are limiting patient from ADLs and leisure.   COMORBIDITIES: has co-morbidities such as Prader-Willi  syndrome, intellectual delay  that affect occupational performance. Patient will benefit from skilled OT to address above impairments and improve overall function.  MODIFICATION OR ASSISTANCE TO COMPLETE EVALUATION: Min-Moderate modification of tasks or assist with assess necessary to complete an evaluation.  OT OCCUPATIONAL PROFILE AND HISTORY: Detailed assessment: Review of records and additional review of physical, cognitive, psychosocial history related to current functional performance.  CLINICAL DECISION MAKING: Moderate - several treatment options, min-mod task modification necessary  REHAB POTENTIAL: Good  EVALUATION COMPLEXITY: Moderate      PLAN:  OT FREQUENCY: 1-2x/week  OT DURATION: 12 weeks  PLANNED INTERVENTIONS: 97535 self care/ADL training, 96045 therapeutic exercise, 97530 therapeutic activity, patient/family education, and DME and/or AE instructions  RECOMMENDED OTHER SERVICES: PT  CONSULTED AND AGREED WITH PLAN OF CARE: Patient and family member/caregiver  PLAN FOR NEXT SESSION: ADLs  Duey Ghent, MS, OTR/L   10/20/2023, 5:15 PM

## 2023-10-20 NOTE — Therapy (Signed)
 OUTPATIENT PHYSICAL THERAPY LE EVALUATION   Patient Name: Linda Mason MRN: [id number] DOB:1995-07-16, 28 y.o., female Today's Date: 10/20/2023  END OF SESSION:  PT End of Session - 10/20/23 1445     Visit Number 2    Number of Visits 25    Date for PT Re-Evaluation 01/10/24    PT Start Time 1445    PT Stop Time 1530    PT Time Calculation (min) 45 min    Equipment Utilized During Treatment Gait belt    Activity Tolerance Patient tolerated treatment well;Other (comment)   pt throughout reporting fear of movement   Behavior During Therapy WFL for tasks assessed/performed             Past Medical History:  Diagnosis Date   Diabetes mellitus without complication [facility])    Past Surgical History:  Procedure Laterality Date   KNEE ARTHROSCOPY Right 08/27/2023   Procedure: ARTHROSCOPY KNEE;  Surgeon: [name], MD;  Location: [facility] ORS;  Service: Orthopedics;  Laterality: Right;   Patient Active Problem List   Diagnosis Date Noted   Closed patellar dislocation, right, initial encounter 09/01/2023   Patellar dislocation 08/26/2023   Type 2 diabetes mellitus without complication, with long-term current use of insulin (HCC) 08/26/2023   Hypoxia 08/26/2023   Morbid obesity (HCC) 10/09/2021   Diabetes mellitus with neuropathy (HCC) 10/09/2021   Essential hypertension 10/09/2021   Thrombocytopenia (HCC) 10/09/2021   Hypothyroidism 10/09/2021   Prader-Willi syndrome 10/09/2021   Cellulitis 10/08/2021     [facility]: [name]  REFERRING PROVIDER: [name], PA-C   REFERRING DIAG:  Diagnosis  S83.004A (ICD-10-CM) - Closed patellar dislocation, right, initial encounter    Rationale for Evaluation and Treatment: Rehabilitation  THERAPY DIAG:  Muscle weakness (generalized)  Chronic pain of right knee  Difficulty in walking, not elsewhere classified  Unsteadiness on feet  Closed patellar dislocation, right, initial encounter  ONSET  DATE: 08/26/2023   SUBJECTIVE:                                                                                                                                                                                           SUBJECTIVE STATEMENT  Pt reports she was able to do the exercises yesterday.  Pt notes no pain upon arrival to the clinic as well.  Pt had a good session with OT reported by pt.    The pt is a pleasant 28 yo female. She presents in [facility] with her mom and an interpreter present for PT eval. Pt suffered a R knee dislocation following a fall 08/26/2023. Pt also found to have lateral  femoral condyle impacting fx and medial patella impaction fracture of RLE. She underwent arthroscopic assisted R patellar reduction with lateral release on 08/27/2023. Pt admitted to CIR from 09/02/23 - 09/15/23/ Pt just had follow-up with surgeon and has been instructed to wean from hinged knee brace/has been released from care; she is WBAT. Pt's mother reports pt using 2WW to ambulate for short distances: maybe ambulating 30-50 steps at most without brace. Pt reports ambulation is limited by R knee pain. Her knee hurts most when walking. She has difficulty completing STS but reports she can do this on her own, although not able to do this yet in her school's bathroom and would like to work on this. Pt was independent with ADLs prior to injury. Her mother is helping her now with dressing. She has not yet used steps to enter her home, is currently using her WC on ramp. They report no other falls. Pt wants to improve ability to ambulate for her school prom. She likes to watch movies.  Pt has Prader-Willi syndrome, intellectual delay.   PERTINENT HISTORY:   PMH also significant for DM II with neuropathy, HTN, thrombocytopenia, hypothyroidism, Prader-Willi syndrome.    PAIN:  Are you having pain?  None currently, takes medicine for her knee pain; no othe rpain  PRECAUTIONS:  Knee - WBAT  RED FLAGS: None   WEIGHT  BEARING RESTRICTIONS:  Via surgeon note "1. I recommended the patient be weightbearing as tolerated and wean from the hinged knee brace."  FALLS:  Has patient fallen in last 6 months? No  LIVING ENVIRONMENT: Lives with: lives with their family in one level home Stairs:  2 steps, handrails bilat  to enter home, but also has two ramps that she uses to enter home and is not currently using steps Has following equipment at home: RW  OCCUPATION:  Student   PLOF:  Independent  PATIENT GOALS:  Pt wants to walk to be able to go to school prom; she wants to be able to dance.  NEXT MD VISIT: Had follow-up today,  was released from care but can follow-up as needed.    OBJECTIVE:  Note: Objective measures were completed at Evaluation unless otherwise noted.  DIAGNOSTIC FINDINGS:  Via chart, note Novella Olive, MD "R Knee radiographs:  10/18/2023: 2 views (wheelchair bound lateral and oblique radiographs with suboptimal positioning) were obtained. Appropriate images cannot be obtained due to patient's hesitation/unwillingness to transfer to x-ray table. Within these limitations, there does not appear to be a recurrent patellar dislocation. There is significant osteopenia. Impaction fracture about the lateral femoral condyle is also visualized. It does not appear to fully involve the articular weightbearing surface. "  PATIENT SURVEYS:  LEFS deferred  COGNITIVE STATUS: Impaired at baseline   SENSATION: WFL BLE  COORDINATION: WFL rapid alt LE, unable to do heel>shin likely limited by body habitus and some hesitance/faer of movement  EDEMA:  Pt R knee still with slight edema around patella, no redness present, scars healing well  POSTURE:  rounded shoulders, increased fwd posture/kyphosis possibly impacted by body habitus    GAIT: Distance walked: (see below) Assistive device utilized: Environmental consultant - 2 wheeled Level of assistance: CGA Comments: Decreased gait speed,  antalgic gait, decreased weightbearing and stance time on RLE, heavy BUE weightbearing through RW    Body Part #1 Knee  PALPATION: No pain with palpation superior, inferior, LTL patella, posterior knee RLE  LOWER EXTREMITY ROM:    Need to complete formal assessment, however  pt exhibited to have impaired R hip flexion mobility with MMT, possibly due positioning but needs further assessment.    LOWER EXTREMITY MMT:    MMT Right eval Left eval  Hip flexion 3- 3+  Hip extension    Hip abduction 4 4  Hip adduction 3* 4  Hip internal rotation    Hip external rotation    Knee flexion * 4  Knee extension 3* 4+  Ankle dorsiflexion 4 4  Ankle plantarflexion    Ankle inversion    Ankle eversion     (Blank rows = not tested) "*" indicates pain-limited. Pt also might be self-limiting due to fear of pain with movement     FUNCTIONAL TESTS:  5 times sit to stand: 42.5 sec heavy use of UE to RW  6 minute walk test: deferred tp future visit 10 meter walk test: 0.16 sec with BRW  Berg Balance Scale: deferred to future visit                                                                                                                        TREATMENT DATE:    Physical Performance:  LEFS: 72.5% disability (22/80 = 27.5%) : 280'    TA:  STS, x10 Standing hip abduction with UE supported at plinth, x10 each LE - difficult to perform placing weight on L LE Standing hip extension with UE supported at plinth, x10 each LE - difficult to perform placing weight on L LE Standing hip flexion/high knee marches with UE supported at plinth, x10 each LE - difficult to perform placing weight on L LE Standing squats with UE supported at plinth, x10 Standing heel raises with UE support at plinth, x10      Access Code: [email or web address] URL: [email or web address] Date: 10/20/2023 Prepared by: Dawson Europe  Exercises - Sit to Stand with Counter Support  - 1 x daily - 7 x  weekly - 2 sets - 5 reps - Standing March with Counter Support  - 1 x daily - 3-4 x weekly - 3 sets - 10 reps - Heel Raises with Counter Support  - 1 x daily - 3-4 x weekly - 3 sets - 10 reps - Toe Raises with Counter Support  - 1 x daily - 7 x weekly - 3 sets - 10 reps - Mini Squat with Counter Support  - 1 x daily - 7 x weekly - 3 sets - 10 reps    PATIENT EDUCATION:  Education details: assessment findings, goals, HEP, plan, safe technique with STS to walker Person educated: Patient and Parent Education method: Explanation, Demonstration, Tactile cues, Verbal cues, and Handouts Education comprehension: verbalized understanding, returned demonstration, verbal cues required, tactile cues required, and needs further education  HOME EXERCISE PROGRAM: Reviewed: safe technique, signs and symptoms to watch for that proceeding is OK, stop if feeling increased pain/any sharp pain in knee Access Code: [email or web address] URL: [email or web address] Date: 10/18/2023 Prepared by: Aminta Kales  Exercises - Sit to Stand  with Counter Support  - 1 x daily - 7 x weekly - 2 sets - 5 reps    ASSESSMENT:  CLINICAL IMPRESSION:  Pt responded well to the exercises and the further testing necessary for goal assessment.  Pt also was given newly updated HEP to provide new exercises that would challenge the pt's strength at home, while also maintaining a safe environment.  Pt performed well with those exercises and was able to demonstrate them with good technique.   Pt will continue to benefit from skilled therapy to address remaining deficits in order to improve overall QoL and return to PLOF.      OBJECTIVE IMPAIRMENTS: Abnormal gait, decreased activity tolerance, decreased balance, decreased coordination, decreased endurance, decreased mobility, difficulty walking, decreased ROM, decreased strength, increased edema, impaired flexibility, improper body mechanics, postural dysfunction, obesity, and pain.    ACTIVITY LIMITATIONS: carrying, lifting, bending, standing, squatting, stairs, transfers, toileting, dressing, and locomotion level  PARTICIPATION LIMITATIONS: meal prep, cleaning, laundry, shopping, community activity, yard work, and school  PERSONAL FACTORS: Fitness, Sex, and 3+ comorbidities: PMH significant for DM II with neuropathy, HTN, thrombocytopenia, hypothyroidism, Prader-Willi syndrome,   are also affecting patient's functional outcome.   REHAB POTENTIAL: Good  CLINICAL DECISION MAKING: Evolving/moderate complexity  EVALUATION COMPLEXITY: Moderate   GOALS: Goals reviewed with patient? Yes  SHORT TERM GOALS: Target date: 11/29/2023  Patient will be independent in home exercise program to improve strength/mobility for better functional independence with ADLs. Baseline: initiated Goal status: INITIAL    LONG TERM GOALS: Target date: 01/10/2024  Patient will increase lower extremity functional scale to >60/80 to demonstrate improved functional mobility and increased tolerance with ADLs. .  Baseline: deferred to future session Goal status: INITIAL  2.  Patient (> 45 years old) will complete five times sit to stand test in < 15 seconds indicating an increased LE strength and improved balance. Baseline: 42.5 sec, heavy use of BUE Goal status: INITIAL  3.  Patient will increase Berg Balance score by > 6 points to demonstrate decreased fall risk during functional activities Baseline: deferred to future session, may need to complete different balance test if pt has difficulty with cuing for test activities  Goal status: INITIAL  4.  Patient will increase 10 meter walk test to >1.79m/s as to improve gait speed for better community ambulation and to reduce fall risk. Baseline: 0.16 with BRW Goal status: INITIAL  5.  Patient will increase six minute walk test distance to >1000 for progression to community ambulator and improve gait ability. Baseline:  280 ft with RW Goal  status: INITIAL    PLAN:  PT FREQUENCY: 1-2x/week  PT DURATION: 12 weeks  PLANNED INTERVENTIONS: 97164- PT Re-evaluation, 97750- Physical Performance Testing, 97110-Therapeutic exercises, 97530- Therapeutic activity, [id number]- Neuromuscular re-education, 97535- Self Care, 16109- Manual therapy, [phone]- Gait training, [phone]- Orthotic Initial, [phone]- Orthotic/Prosthetic subsequent, [phone]- Canalith repositioning, [id number]- Splinting, [id number]- Electrical stimulation (unattended), [phone]- Electrical stimulation (manual), [id number]- Ultrasound, Patient/Family education, Balance training, Stair training, Taping, Joint mobilization, Joint manipulation, Spinal mobilization, Scar mobilization, Vestibular training, DME instructions, Wheelchair mobility training, Cryotherapy, and Moist heat.  PLAN FOR NEXT SESSION: add to HEP, complete BERG as indicated/time permits   Nolon Bussing, PT, DPT Physical Therapist - [facility]  10/20/23, 5:55 PM

## 2023-10-21 ENCOUNTER — Encounter: Payer: MEDICAID | Admitting: Occupational Therapy

## 2023-10-27 ENCOUNTER — Ambulatory Visit: Payer: MEDICAID | Admitting: Occupational Therapy

## 2023-10-27 ENCOUNTER — Ambulatory Visit: Payer: MEDICAID

## 2023-10-27 DIAGNOSIS — M6281 Muscle weakness (generalized): Secondary | ICD-10-CM

## 2023-10-27 DIAGNOSIS — S83004A Unspecified dislocation of right patella, initial encounter: Secondary | ICD-10-CM

## 2023-10-27 DIAGNOSIS — R2681 Unsteadiness on feet: Secondary | ICD-10-CM

## 2023-10-27 DIAGNOSIS — R262 Difficulty in walking, not elsewhere classified: Secondary | ICD-10-CM

## 2023-10-27 DIAGNOSIS — G8929 Other chronic pain: Secondary | ICD-10-CM

## 2023-10-27 NOTE — Therapy (Signed)
 OUTPATIENT PHYSICAL THERAPY LE TREATMENT   Patient Name: Linda Mason MRN: [id number] DOB:[birth year], 28 y.o., female Today's Date: 10/27/2023  END OF SESSION:  PT End of Session - 10/27/23 1644     Visit Number 3    Number of Visits 25    Date for PT Re-Evaluation 01/10/24    PT Start Time 1321    PT Stop Time 1359    PT Time Calculation (min) 38 min    Equipment Utilized During Treatment Gait belt    Activity Tolerance Patient tolerated treatment well;Other (comment)   pt throughout reporting fear of movement   Behavior During Therapy WFL for tasks assessed/performed              Past Medical History:  Diagnosis Date   Diabetes mellitus without complication [facility])    Past Surgical History:  Procedure Laterality Date   KNEE ARTHROSCOPY Right 08/27/2023   Procedure: ARTHROSCOPY KNEE;  Surgeon: [name], MD;  Location: [facility] ORS;  Service: Orthopedics;  Laterality: Right;   Patient Active Problem List   Diagnosis Date Noted   Closed patellar dislocation, right, initial encounter 09/01/2023   Patellar dislocation 08/26/2023   Type 2 diabetes mellitus without complication, with long-term current use of insulin  (HCC) 08/26/2023   Hypoxia 08/26/2023   Morbid obesity (HCC) 10/09/2021   Diabetes mellitus with neuropathy (HCC) 10/09/2021   Essential hypertension 10/09/2021   Thrombocytopenia (HCC) 10/09/2021   Hypothyroidism 10/09/2021   Prader-Willi syndrome 10/09/2021   Cellulitis 10/08/2021     [facility]P: [name]  REFERRING PROVIDER: [name], PA-C   REFERRING DIAG:  Diagnosis  S83.004A (ICD-10-CM) - Closed patellar dislocation, right, initial encounter    Rationale for Evaluation and Treatment: Rehabilitation  THERAPY DIAG:  Muscle weakness (generalized)  Chronic pain of right knee  Difficulty in walking, not elsewhere classified  Unsteadiness on feet  Closed patellar dislocation, right, initial encounter  ONSET  DATE: 08/26/2023   SUBJECTIVE:                                                                                                                                                                                           SUBJECTIVE STATEMENT  Patient continues to report no pain and states she has been compliant with her new HEP so far.    The pt is a pleasant 28 yo female. She presents in [facility] with her mom and an interpreter present for PT eval. Pt suffered a R knee dislocation following a fall 08/26/2023. Pt also found to have lateral femoral condyle impacting fx and medial patella impaction fracture of RLE. She underwent arthroscopic  assisted R patellar reduction with lateral release on 08/27/2023. Pt admitted to CIR from 09/02/23 - 09/15/23/ Pt just had follow-up with surgeon and has been instructed to wean from hinged knee brace/has been released from care; she is WBAT. Pt's mother reports pt using 2WW to ambulate for short distances: maybe ambulating 30-50 steps at most without brace. Pt reports ambulation is limited by R knee pain. Her knee hurts most when walking. She has difficulty completing STS but reports she can do this on her own, although not able to do this yet in her school's bathroom and would like to work on this. Pt was independent with ADLs prior to injury. Her mother is helping her now with dressing. She has not yet used steps to enter her home, is currently using her WC on ramp. They report no other falls. Pt wants to improve ability to ambulate for her school prom. She likes to watch movies.  Pt has Prader-Willi syndrome, intellectual delay.   PERTINENT HISTORY:   PMH also significant for DM II with neuropathy, HTN, thrombocytopenia, hypothyroidism, Prader-Willi syndrome.    PAIN:  Are you having pain?  None currently, takes medicine for her knee pain; no othe rpain  PRECAUTIONS:  Knee - WBAT  RED FLAGS: None   WEIGHT BEARING RESTRICTIONS:  Via surgeon note "1. I recommended  the patient be weightbearing as tolerated and wean from the hinged knee brace."  FALLS:  Has patient fallen in last 6 months? No  LIVING ENVIRONMENT: Lives with: lives with their family in one level home Stairs:  2 steps, handrails bilat  to enter home, but also has two ramps that she uses to enter home and is not currently using steps Has following equipment at home: RW  OCCUPATION:  Student   PLOF:  Independent  PATIENT GOALS:  Pt wants to walk to be able to go to school prom; she wants to be able to dance.  NEXT MD VISIT: Had follow-up today,  was released from care but can follow-up as needed.    OBJECTIVE:  Note: Objective measures were completed at Evaluation unless otherwise noted.  DIAGNOSTIC FINDINGS:  Via chart, note [name], MD "R Knee radiographs:  10/18/2023: 2 views (wheelchair bound lateral and oblique radiographs with suboptimal positioning) were obtained. Appropriate images cannot be obtained due to patient's hesitation/unwillingness to transfer to x-ray table. Within these limitations, there does not appear to be a recurrent patellar dislocation. There is significant osteopenia. Impaction fracture about the lateral femoral condyle is also visualized. It does not appear to fully involve the articular weightbearing surface. "  PATIENT SURVEYS:  LEFS deferred  COGNITIVE STATUS: Impaired at baseline   SENSATION: WFL BLE  COORDINATION: WFL rapid alt LE, unable to do heel>shin likely limited by body habitus and some hesitance/faer of movement  EDEMA:  Pt R knee still with slight edema around patella, no redness present, scars healing well  POSTURE:  rounded shoulders, increased fwd posture/kyphosis possibly impacted by body habitus    GAIT: Distance walked: (see below) Assistive device utilized: Environmental consultant - 2 wheeled Level of assistance: CGA Comments: Decreased gait speed, antalgic gait, decreased weightbearing and stance time on RLE,  heavy BUE weightbearing through RW    Body Part #1 Knee  PALPATION: No pain with palpation superior, inferior, LTL patella, posterior knee RLE  LOWER EXTREMITY ROM:    Need to complete formal assessment, however pt exhibited to have impaired R hip flexion mobility with MMT, possibly due positioning  but needs further assessment.    LOWER EXTREMITY MMT:    MMT Right eval Left eval  Hip flexion 3- 3+  Hip extension    Hip abduction 4 4  Hip adduction 3* 4  Hip internal rotation    Hip external rotation    Knee flexion * 4  Knee extension 3* 4+  Ankle dorsiflexion 4 4  Ankle plantarflexion    Ankle inversion    Ankle eversion     (Blank rows = not tested) "*" indicates pain-limited. Pt also might be self-limiting due to fear of pain with movement     FUNCTIONAL TESTS:  5 times sit to stand: 42.5 sec heavy use of UE to RW  6 minute walk test: deferred tp future visit 10 meter walk test: 0.16 sec with BRW  Berg Balance Scale: deferred to future visit                                                                                                                        TREATMENT DATE:   *Spanish interpreter present for patient's mother during visit today.  Therapeutic Exercise: therapeutic exercises to develop strength and endurance, range of motion and flexibility  *observed RLE- Hip ER in supine and patient performed RLE exercises:  Quad sets- Hold 5 sec (some difficulty due to intellectual delay) but did improve with reps- x 10  Heel slides- AAROM 2 x 10 reps  Hip abd- AAROM RLE 2 x 10 reps SLR-  AAROM RLE 2 x 10 reps   *measured AROM Right knee= 68 deg and up to 85 deg AAROM Right knee flex  TA: Standing in //bars Standing hip abduction with UE supported using // bars, x10 each LE - difficult to perform placing weight on R LE to perform active L LE activity- Instructed to alternate LE and patient did improve with practice. Standing hip extension x 10 reps alt LE  (continued decrease confidence with weightbearing through RLE) Standing knee marches with UE supported  x10 each LE  Standing heel raises with UE support at plinth, x10   Manual therapy:   STM to areas of scars on right knee- x 3 min Patella mobs(med to lateral) and (sup and inferior) Right knee x 4 min - mild stiffness with sup/inferior mobs.    Self care/Home management:  Reviewed HEP from previous visit using  [email or web address] Dicussed trying to alternate with standing activities to avoid prolonged RLE weight bearing.   Gait training: Patient ambulated a total of approx 100 feet using RW, CGA and use of gait belt- patient initially exhibiting step to gait - yet able to improve to step through gait and decreased BUE Support. Still decreased RLE weight bearing and short hops.     PATIENT EDUCATION:  Education details: assessment findings, goals, HEP, plan, safe technique with STS to walker Person educated: Patient and Parent Education method: Explanation, Demonstration, Tactile cues, Verbal cues, and Handouts Education comprehension: verbalized understanding, returned demonstration, verbal cues required,  tactile cues required, and needs further education  HOME EXERCISE PROGRAM: Reviewed: safe technique, signs and symptoms to watch for that proceeding is OK, stop if feeling increased pain/any sharp pain in knee Access Code: [email or web address] URL: [email or web address] Date: 10/18/2023 Prepared by: Aminta Kales  Exercises - Sit to Stand with Counter Support  - 1 x daily - 7 x weekly - 2 sets - 5 reps    ASSESSMENT:  CLINICAL IMPRESSION:  Patient arrived with good motivation. She continues to present with no pain before or during session. She has some issues with comprehension due to some impaired cognition but responded well overall with verbal and visual cues. She presents with increased fear with weight bearing through RLE yet was able to overcome  fears some during session demonstrating improving ability to perform alternating LE activities and walk with decreased UE support.  Pt will continue to benefit from skilled therapy to address remaining deficits in order to improve overall QoL and return to PLOF.      OBJECTIVE IMPAIRMENTS: Abnormal gait, decreased activity tolerance, decreased balance, decreased coordination, decreased endurance, decreased mobility, difficulty walking, decreased ROM, decreased strength, increased edema, impaired flexibility, improper body mechanics, postural dysfunction, obesity, and pain.   ACTIVITY LIMITATIONS: carrying, lifting, bending, standing, squatting, stairs, transfers, toileting, dressing, and locomotion level  PARTICIPATION LIMITATIONS: meal prep, cleaning, laundry, shopping, community activity, yard work, and school  PERSONAL FACTORS: Fitness, Sex, and 3+ comorbidities: PMH significant for DM II with neuropathy, HTN, thrombocytopenia, hypothyroidism, Prader-Willi syndrome,   are also affecting patient's functional outcome.   REHAB POTENTIAL: Good  CLINICAL DECISION MAKING: Evolving/moderate complexity  EVALUATION COMPLEXITY: Moderate   GOALS: Goals reviewed with patient? Yes  SHORT TERM GOALS: Target date: 11/29/2023  Patient will be independent in home exercise program to improve strength/mobility for better functional independence with ADLs. Baseline: initiated Goal status: INITIAL    LONG TERM GOALS: Target date: 01/10/2024  Patient will increase lower extremity functional scale to >60/80 to demonstrate improved functional mobility and increased tolerance with ADLs. .  Baseline: deferred to future session Goal status: INITIAL  2.  Patient (> 64 years old) will complete five times sit to stand test in < 15 seconds indicating an increased LE strength and improved balance. Baseline: 42.5 sec, heavy use of BUE Goal status: INITIAL  3.  Patient will increase Berg Balance score by > 6  points to demonstrate decreased fall risk during functional activities Baseline: deferred to future session, may need to complete different balance test if pt has difficulty with cuing for test activities  Goal status: INITIAL  4.  Patient will increase 10 meter walk test to >1.7m/s as to improve gait speed for better community ambulation and to reduce fall risk. Baseline: 0.16 with BRW Goal status: INITIAL  5.  Patient will increase six minute walk test distance to >1000 for progression to community ambulator and improve gait ability. Baseline:  280 ft with RW Goal status: INITIAL    PLAN:  PT FREQUENCY: 1-2x/week  PT DURATION: 12 weeks  PLANNED INTERVENTIONS: [id number]- PT Re-evaluation, 97750- Physical Performance Testing, 97110-Therapeutic exercises, 97530- Therapeutic activity, [id number]- Neuromuscular re-education, 97535- Self Care, 47829- Manual therapy, [phone]- Gait training, [phone]- Orthotic Initial, [phone]- Orthotic/Prosthetic subsequent, [phone]- Canalith repositioning, [id number]- Splinting, [id number]- Electrical stimulation (unattended), [phone]- Electrical stimulation (manual), [id number]- Ultrasound, Patient/Family education, Balance training, Stair training, Taping, Joint mobilization, Joint manipulation, Spinal mobilization, Scar mobilization, Vestibular training, DME instructions, Wheelchair mobility training, Cryotherapy, and Moist heat.  PLAN FOR NEXT SESSION: Progress R  knee ROM, RLE muscle strengthening and balance - add to HEP, and complete BERG as indicated/time permits   Ossie Blend, PT Physical Therapist - [facility]  10/27/23, 5:27 PM

## 2023-10-28 NOTE — Therapy (Signed)
 OUTPATIENT OCCUPATIONAL THERAPY ORTHO TREATMENT  Patient Name: Linda Mason MRN: [id number] DOB:01-Sep-1995, 28 y.o., female Today's Date: 10/28/2023  [facility] REFERRING PROVIDER: Georjean Kite  END OF SESSION:  OT End of Session - 10/28/23 1122     Visit Number 4    Number of Visits 24    Date for OT Re-Evaluation 01/06/24    OT Start Time 1400    OT Stop Time 1445    OT Time Calculation (min) 45 min    Equipment Utilized During Treatment wheelchair    Activity Tolerance Patient tolerated treatment well    Behavior During Therapy [facility] for tasks assessed/performed             Past Medical History:  Diagnosis Date   Diabetes mellitus without complication (HCC)    Past Surgical History:  Procedure Laterality Date   KNEE ARTHROSCOPY Right 08/27/2023   Procedure: ARTHROSCOPY KNEE;  Surgeon: [name], MD;  Location: [facility] ORS;  Service: Orthopedics;  Laterality: Right;   Patient Active Problem List   Diagnosis Date Noted   Closed patellar dislocation, right, initial encounter 09/01/2023   Patellar dislocation 08/26/2023   Type 2 diabetes mellitus without complication, with long-term current use of insulin  (HCC) 08/26/2023   Hypoxia 08/26/2023   Morbid obesity (HCC) 10/09/2021   Diabetes mellitus with neuropathy (HCC) 10/09/2021   Essential hypertension 10/09/2021   Thrombocytopenia (HCC) 10/09/2021   Hypothyroidism 10/09/2021   Prader-Willi syndrome 10/09/2021   Cellulitis 10/08/2021    ONSET DATE: 08/26/2023  REFERRING DIAG: Right knee patella fracture  THERAPY DIAG:  Muscle weakness (generalized)  Rationale for Evaluation and Treatment: Rehabilitation  SUBJECTIVE:   SUBJECTIVE STATEMENT:  Pt. reports looking forward to going to Prom on May 13th. Pt accompanied by: family member and interpreter: Lavonia Powers - Spanish interpreter  PERTINENT HISTORY: Linda Mason is a 28 year old right-handed morbidly obese female  and BMI 44.88 as well as diabetes mellitus, Prader-Willi syndrome, intellectual delay. Presented to [facility] ED on 08/26/2023 after mechanical fall getting out of her bed. X-rays and imaging revealed right knee irreducible patellar dislocation right knee lateral femoral condyle impacting fracture as well as right knee medial patella impaction fracture. Pt underwent right knee arthroscopic assisted patellar reduction with lateral release on 08/27/2023 per Dr. [name].  Weightbearing as tolerated right lower extremity knee immobilizer when out of bed or when walking. Pt admitted to CIR from 09/02/23 - 09/15/23  PRECAUTIONS: Knee , Knee Immobilizer    WEIGHT BEARING RESTRICTIONS: Yes WBAT  PAIN:  Are you having pain? Yes: NPRS scale: 0/10 Pain location: knee  FALLS: Has patient fallen in last 6 months? No  LIVING ENVIRONMENT: Lives with: lives with their family Lives in: House/apartment Stairs: No ramp Has following equipment at home: Wheelchair (manual)  PLOF: Independent with basic ADLs  PATIENT GOALS: to return to school and go to [employer] with no fear of falling   NEXT MD VISIT: none  OBJECTIVE:  Note: Objective measures were completed at Evaluation unless otherwise noted.  HAND DOMINANCE: Left  ADLs: Transfers/ambulation related to ADLs: SUPERVISION short distances using RW, MOD I using w/c Eating: IND Grooming: IND Upper body dressing: SETUP assist Lower body dressing: MAX A Toileting: SUPERVISION Bathing: MOD A bathing   Tub shower transfers: SUPERVISION Equipment: Shower seat with back, Walk in shower, and bed side commode  UPPER EXTREMITY ROM:     Active ROM Right  WFL Left  WFL    UPPER EXTREMITY MMT:  Question accuracy given difficulty following commands   MMT Right eval Left eval  Shoulder flexion 3+/5 3+/5  Shoulder abduction    Elbow flexion 3+/5 3+/5  Elbow extension 3+/5 3+/5  Wrist flexion    Wrist extension    (Blank rows = not tested)  HAND  FUNCTION: Grip strength: Right: 10 lbs; Left: 15 lbs, Lateral pinch: Right: 4 lbs, Left: 5 lbs, and 3 point pinch: Right: 1 lbs, Left: 1 lbs  COORDINATION: 9 Hole Peg test: Right: 40 sec; Left: 30 sec  SENSATION: Light touch: Impaired  tingling  EDEMA: none  COGNITION: Overall cognitive status: History of cognitive impairments - at baseline Areas of impairment: Areas of impairment: Problem solving  Commands: Follows one step commands with increased time Behavior: Perseveration and anxious  OBSERVATIONS: Pt appears anxious and looking toward her mother for answers to questions.    TREATMENT DATE: 10/27/23  Therapeutic Ex:  -Pt. worked on [employer], and reciprocal motion using the UBE while seated for 8 min. with minimal resistance. Constant monitoring was provided.  -Performed BUE strengthening using a 2# dowel ex. 2/2 to weakness. Bilateral shoulder flexion, chest press, circular patterns, and elbow flexion/extension for 1 set 10 reps each.  -Performed BUE strengthening using 2# hand weight for forearm supination/pronation, wrist flexion/extension, and radial deviation for 1 set 10 reps each. -UE strengthening was performed to increase UE strength needed for transfers, and ADL/IADL tasks.  Self-care:  -Practiced toileting transfers from the w/c to the toilet using the bathroom grab bar to prepare for toileting transfers at school. -Pt. required CGA for tranfers with increased time, and positive encouragement. -Pt./caregiver were assisted with problem solving through the toileting situation at school, as Pt./caregiver report she will have to use the w/c, and will not have her walker with her.                                                                                                                PATIENT EDUCATION: Education details: UE functioning, toilet set-up at school. Person educated: Parent Education method: Medical illustrator Education  comprehension: verbalized understanding  HOME EXERCISE PROGRAM:  Continue to assess, review, and provide as indicated  GOALS: Goals reviewed with patient? Yes    SHORT TERM GOALS: Target date: 11/24/23  Pt will INDEPENDENTLY complete HEP  Baseline: 10/14/23: no HEP issued Goal status: INITIAL  LONG TERM GOALS: Target date: 01/06/24  Pt will increase B strength by 15# to assist with gripping grab bar for safe transfers   Baseline: 10/14/23: L 15#, R 10#  Goal status: INITIAL  2.  Pt will INDEPENDENTLY transfer to regular height toilet  Baseline: SUPERVISION + RW for elevated toilet, MIN A + RW for regular toilet Goal status: INITIAL  3.  Pt will improve B FMC by decreasing 9 hole PEG test score by 10 seconds for participation in leisure activities (coloring).  Baseline: L 30", R 40" Goal status: INITIAL  4.  Pt will verbalize x3 strategies to manage anxiousness and fear of  falling to reduce fall risk. Baseline: 0/3 strategies.  Goal status: INITIAL  5.  Pt will increase B lateral pinch strength by 5# to manage ADL items.  Baseline: R 4#, L 5# Goal status: INITIAL   ASSESSMENT:  CLINICAL IMPRESSION:  Pt. repeatedly reports being "scared" to use the bathroom at school which involves transferring from the w/c to the toilet using the grab bar. Pt. Is able to perform toileting transfers from the w/c with CGA when attempted in the clinic. Pt. Requires increased time, and positive encouragement to complete. Pt./mother report the Pt. is independently performing toileting skills at home using the walker. Pt./caregiver were assisted in problem solving through various options for navigating through barriers for successful toileting at school. Pt. Mother reports they plan to try to practice toileting transfers at school. Pt. education was also provided about reaching out to the office of academic accommodations at the school for additional assistance. Pt. continues to benefit from OT services  to improve bilateral strength/dexterity and toilet t/f's for safe return to school.    PERFORMANCE DEFICITS: in functional skills including ADLs, coordination, strength, pain, and balance, cognitive skills including attention and problem solving, and psychosocial skills including coping strategies and environmental adaptation.   IMPAIRMENTS: are limiting patient from ADLs and leisure.   COMORBIDITIES: has co-morbidities such as Prader-Willi syndrome, intellectual delay  that affect occupational performance. Patient will benefit from skilled OT to address above impairments and improve overall function.  MODIFICATION OR ASSISTANCE TO COMPLETE EVALUATION: Min-Moderate modification of tasks or assist with assess necessary to complete an evaluation.  OT OCCUPATIONAL PROFILE AND HISTORY: Detailed assessment: Review of records and additional review of physical, cognitive, psychosocial history related to current functional performance.  CLINICAL DECISION MAKING: Moderate - several treatment options, min-mod task modification necessary  REHAB POTENTIAL: Good  EVALUATION COMPLEXITY: Moderate      PLAN:  OT FREQUENCY: 1-2x/week  OT DURATION: 12 weeks  PLANNED INTERVENTIONS: 97535 self care/ADL training, 45409 therapeutic exercise, 97530 therapeutic activity, patient/family education, and DME and/or AE instructions  RECOMMENDED OTHER SERVICES: PT  CONSULTED AND AGREED WITH PLAN OF CARE: Patient and family member/caregiver  PLAN FOR NEXT SESSION: ADLs  Duey Ghent, MS, OTR/L   10/28/2023, 11:25 AM

## 2023-10-29 ENCOUNTER — Ambulatory Visit: Payer: MEDICAID

## 2023-10-29 DIAGNOSIS — M6281 Muscle weakness (generalized): Secondary | ICD-10-CM

## 2023-10-29 DIAGNOSIS — G8929 Other chronic pain: Secondary | ICD-10-CM

## 2023-10-29 DIAGNOSIS — R262 Difficulty in walking, not elsewhere classified: Secondary | ICD-10-CM

## 2023-10-29 DIAGNOSIS — R2681 Unsteadiness on feet: Secondary | ICD-10-CM

## 2023-10-29 DIAGNOSIS — S83004A Unspecified dislocation of right patella, initial encounter: Secondary | ICD-10-CM | POA: Diagnosis not present

## 2023-10-29 DIAGNOSIS — R278 Other lack of coordination: Secondary | ICD-10-CM

## 2023-10-29 NOTE — Therapy (Signed)
 OUTPATIENT OCCUPATIONAL THERAPY ORTHO TREATMENT  Patient Name: Linda Mason MRN: [id number] DOB:11-Sep-1995, 28 y.o., female Today's Date: 10/29/2023  PCP: Linda Mason, [facility] REFERRING PROVIDER: Georjean Mason  END OF SESSION:  OT End of Session - 10/29/23 1254     Visit Number 5    Number of Visits 24    Date for OT Re-Evaluation 01/06/24    OT Start Time 0936    OT Stop Time 1015    OT Time Calculation (min) 39 min    Equipment Utilized During Treatment wheelchair, RW    Activity Tolerance Patient tolerated treatment well    Behavior During Therapy [facility] for tasks assessed/performed            Past Medical History:  Diagnosis Date   Diabetes mellitus without complication (HCC)    Past Surgical History:  Procedure Laterality Date   KNEE ARTHROSCOPY Right 08/27/2023   Procedure: ARTHROSCOPY KNEE;  Surgeon: [name], MD;  Location: [facility] ORS;  Service: Orthopedics;  Laterality: Right;   Patient Active Problem List   Diagnosis Date Noted   Closed patellar dislocation, right, initial encounter 09/01/2023   Patellar dislocation 08/26/2023   Type 2 diabetes mellitus without complication, with long-term current use of insulin  (HCC) 08/26/2023   Hypoxia 08/26/2023   Morbid obesity (HCC) 10/09/2021   Diabetes mellitus with neuropathy (HCC) 10/09/2021   Essential hypertension 10/09/2021   Thrombocytopenia (HCC) 10/09/2021   Hypothyroidism 10/09/2021   Prader-Willi syndrome 10/09/2021   Cellulitis 10/08/2021   ONSET DATE: 08/26/2023  REFERRING DIAG: Right knee patella fracture  THERAPY DIAG:  Muscle weakness (generalized)  Other lack of coordination  Closed patellar dislocation, right, initial encounter  Rationale for Evaluation and Treatment: Rehabilitation  SUBJECTIVE:   SUBJECTIVE STATEMENT: Mom reports she thinks pt is ready to return to school, but she knows pt's anxiety is still present re: mobility and navigating bathroom  ADLs. Pt accompanied by: family member and interpreter: Linda Mason - Spanish interpreter  PERTINENT HISTORY: Linda Mason is a 28 year old right-handed morbidly obese female and BMI 44.88 as well as diabetes mellitus, Prader-Willi syndrome, intellectual delay. Presented to [facility] ED on 08/26/2023 after mechanical fall getting out of her bed. X-rays and imaging revealed right knee irreducible patellar dislocation right knee lateral femoral condyle impacting fracture as well as right knee medial patella impaction fracture. Pt underwent right knee arthroscopic assisted patellar reduction with lateral release on 08/27/2023 per Dr. [name].  Weightbearing as tolerated right lower extremity knee immobilizer when out of bed or when walking. Pt admitted to CIR from 09/02/23 - 09/15/23  PRECAUTIONS: Knee , Knee Immobilizer    WEIGHT BEARING RESTRICTIONS: Yes WBAT  PAIN:  Are you having pain? Yes: NPRS scale: 0/10 Pain location: knee  FALLS: Has patient fallen in last 6 months? No  LIVING ENVIRONMENT: Lives with: lives with their family Lives in: House/apartment Stairs: No ramp Has following equipment at home: Wheelchair (manual)  PLOF: Independent with basic ADLs  PATIENT GOALS: to return to school and go to [employer] with no fear of falling   NEXT MD VISIT: none  OBJECTIVE:  Note: Objective measures were completed at Evaluation unless otherwise noted.  HAND DOMINANCE: Left  ADLs: Transfers/ambulation related to ADLs: SUPERVISION short distances using RW, MOD I using w/c Eating: IND Grooming: IND Upper body dressing: SETUP assist Lower body dressing: MAX A Toileting: SUPERVISION Bathing: MOD A bathing   Tub shower transfers: SUPERVISION Equipment: Shower seat with back, Walk in shower, and bed  side commode  UPPER EXTREMITY ROM:     Active ROM Right  WFL Left  WFL    UPPER EXTREMITY MMT:   Question accuracy given difficulty following commands   MMT Right eval Left eval   Shoulder flexion 3+/5 3+/5  Shoulder abduction    Elbow flexion 3+/5 3+/5  Elbow extension 3+/5 3+/5  Wrist flexion    Wrist extension    (Blank rows = not tested)  HAND FUNCTION: Grip strength: Right: 10 lbs; Left: 15 lbs, Lateral pinch: Right: 4 lbs, Left: 5 lbs, and 3 point pinch: Right: 1 lbs, Left: 1 lbs  COORDINATION: 9 Hole Peg test: Right: 40 sec; Left: 30 sec  SENSATION: Light touch: Impaired  tingling  EDEMA: none  COGNITION: Overall cognitive status: History of cognitive impairments - at baseline Areas of impairment: Areas of impairment: Problem solving  Commands: Follows one step commands with increased time Behavior: Perseveration and anxious  OBSERVATIONS: Pt appears anxious and looking toward her mother for answers to questions.    TREATMENT DATE: 10/29/23 Self Care: -Trial of wc mobility in/out of bathroom (supv/extra time) with walker set up in prep for min guard to transfer on/off toilet; vc for hand placement on grab bar; max A after hand over hand trial for swinging wc foot rests away in prep for transfer - Trial of wc mobility with pt transporting folded walker in her lap, parking outside bathroom door and using RW to ambulate within bathroom (supv), use of RW with supv to complete toilet transfer, and hand hygiene at sink. -Practiced use of RW with supv-CGA to amb functional distances from bathroom to toilet, and wc mobility (1 loop around PT/OT gym) to increase BUE strength for functional mobility using walker or wc. -Advised mom on functional progressions to work towards this weekend, including providing distant supv for bathroom ADLs at home (currently SBA) -Collaborated with PT and also recommended pt begin practicing walking up her ramp to enter/exit home using RW and direct supv/assist from family, working to build strength and confidence for community mobility at school -Assisted mom to compile list for teachers to discuss/inquire about possible  assistance needs with pt's return to school (ie: will pt have assist to propel wc to bathroom, assist to manage leg rests on wc, assist for transporting walker if needed to use within bathroom if wc is needed to reach bathroom from classroom?)  Mom plans to email school to answer these questions. -OT recommends walker use within bathroom at school as pt demonstrates good ability within clinic (initial CGA d/t this therapist new to pt, progressing to supv on additional trials), limited only by anxiety.  Reinforced importance of high reps of walker use in bathroom to increase confidence and stamina.  May use wc to access bathroom as needed as mom reports bathroom is not nearby to pt's classroom, and teacher to assist with walker set up once reaching bathroom stall, though pt did demonstrate good ability to propel wc with walker folded in lap today.  PATIENT EDUCATION: Education details: Bathroom mobility progressions with less use of wc and increased use of walker Person educated: Counselling psychologist, pt Education method: Medical illustrator Education comprehension: verbalized understanding, mother receptive  HOME EXERCISE PROGRAM:  Theraputty  GOALS: Goals reviewed with patient? Yes    SHORT TERM GOALS: Target date: 11/24/23  Pt will INDEPENDENTLY complete HEP  Baseline: 10/14/23: no HEP issued Goal status: INITIAL  LONG TERM GOALS: Target date: 01/06/24  Pt will increase B strength by 15# to assist with gripping grab bar for safe transfers   Baseline: 10/14/23: L 15#, R 10#  Goal status: INITIAL  2.  Pt will INDEPENDENTLY transfer to regular height toilet  Baseline: SUPERVISION + RW for elevated toilet, MIN A + RW for regular toilet Goal status: INITIAL  3.  Pt will improve B FMC by decreasing 9 hole PEG test score by 10 seconds for participation in leisure activities (coloring).  Baseline: L 30",  R 40" Goal status: INITIAL  4.  Pt will verbalize x3 strategies to manage anxiousness and fear of falling to reduce fall risk. Baseline: 0/3 strategies.  Goal status: INITIAL  5.  Pt will increase B lateral pinch strength by 5# to manage ADL items.  Baseline: R 4#, L 5# Goal status: INITIAL   ASSESSMENT:  CLINICAL IMPRESSION: Pt continues to verbalize anxiety with using walker in bathroom but demonstrated very good ability to complete toilet transfer, hand hygiene, and managing closing/locking door, switching lights on/off with supv only.  Mother receptive to various scenarios for increasing independence with bathroom mobility with transition back to school and mom plans to reach out to school to answer questions listed above to identify availability of assistance for walker vs wc use at school.  OT encouraging RW be brought to school with pt to use once she reaches the bathroom at school, as she may need to use wc to reach bathroom as mom reports bathroom is down a longer hallway from pt's classroom.  Mother receptive to recommendations for increasing independence with bathroom ADLs in the home, with OT recommending transition from SBA to distant supv at home this weekend for toileting tasks.  Mother is confident pt is capable of this, and she also reports that she feels pt would be physically ready to return to school, but she acknowledges pt's self limiting behaviors as a result of anxiety.  Pt. continues to benefit from OT services to improve bilateral strength/dexterity, toilet t/f's/bathroom ADLs/functional mobility within bathroom with wc vs walker for safe return to school.   PERFORMANCE DEFICITS: in functional skills including ADLs, coordination, strength, pain, and balance, cognitive skills including attention and problem solving, and psychosocial skills including coping strategies and environmental adaptation.   IMPAIRMENTS: are limiting patient from ADLs and leisure.   COMORBIDITIES:  has co-morbidities such as Prader-Willi syndrome, intellectual delay  that affect occupational performance. Patient will benefit from skilled OT to address above impairments and improve overall function.  MODIFICATION OR ASSISTANCE TO COMPLETE EVALUATION: Min-Moderate modification of tasks or assist with assess necessary to complete an evaluation.  OT OCCUPATIONAL PROFILE AND HISTORY: Detailed assessment: Review of records and additional review of physical, cognitive, psychosocial history related to current functional performance.  CLINICAL DECISION MAKING: Moderate - several treatment options, min-mod task modification necessary  REHAB POTENTIAL: Good  EVALUATION COMPLEXITY: Moderate      PLAN:  OT FREQUENCY: 1-2x/week  OT DURATION: 12 weeks  PLANNED INTERVENTIONS: 97535 self care/ADL training, 96045 therapeutic exercise, 97530 therapeutic activity, patient/family education, and  DME and/or AE instructions  RECOMMENDED OTHER SERVICES: PT  CONSULTED AND AGREED WITH PLAN OF CARE: Patient and family member/caregiver  PLAN FOR NEXT SESSION: ADLs  Marcus Sewer, MS, OTR/L   10/29/2023, 12:56 PM

## 2023-10-29 NOTE — Therapy (Signed)
 OUTPATIENT PHYSICAL THERAPY LE TREATMENT   Patient Name: Linda Mason MRN: [id number] DOB:September 24, 1995, 28 y.o., female Today's Date: 10/29/2023  END OF SESSION:  PT End of Session - 10/29/23 1024     Visit Number 4    Number of Visits 25    Date for PT Re-Evaluation 01/10/24    PT Start Time 1016    PT Stop Time 1055    PT Time Calculation (min) 39 min    Equipment Utilized During Treatment Gait belt    Activity Tolerance Patient tolerated treatment well;Other (comment)   pt throughout reporting fear of movement   Behavior During Therapy WFL for tasks assessed/performed              Past Medical History:  Diagnosis Date   Diabetes mellitus without complication [facility])    Past Surgical History:  Procedure Laterality Date   KNEE ARTHROSCOPY Right 08/27/2023   Procedure: ARTHROSCOPY KNEE;  Surgeon: Linda Rota, MD;  Location: [facility] ORS;  Service: Orthopedics;  Laterality: Right;   Patient Active Problem List   Diagnosis Date Noted   Closed patellar dislocation, right, initial encounter 09/01/2023   Patellar dislocation 08/26/2023   Type 2 diabetes mellitus without complication, with long-term current use of insulin  (HCC) 08/26/2023   Hypoxia 08/26/2023   Morbid obesity (HCC) 10/09/2021   Diabetes mellitus with neuropathy (HCC) 10/09/2021   Essential hypertension 10/09/2021   Thrombocytopenia (HCC) 10/09/2021   Hypothyroidism 10/09/2021   Prader-Willi syndrome 10/09/2021   Cellulitis 10/08/2021     [facility]P: [facility]  REFERRING PROVIDER: [name], PA-C   REFERRING DIAG:  Diagnosis  S83.004A (ICD-10-CM) - Closed patellar dislocation, right, initial encounter    Rationale for Evaluation and Treatment: Rehabilitation  THERAPY DIAG:  Muscle weakness (generalized)  Chronic pain of right knee  Difficulty in walking, not elsewhere classified  Unsteadiness on feet  Closed patellar dislocation, right, initial encounter  ONSET  DATE: 08/26/2023   SUBJECTIVE:                                                                                                                                                                                           SUBJECTIVE STATEMENT  Patient continues to report no pain and mom reports she would like for her to improve her ability with steps.   The pt is a pleasant 28 yo female. She presents in [facility] with her mom and an interpreter present for PT eval. Pt suffered a R knee dislocation following a fall 08/26/2023. Pt also found to have lateral femoral condyle impacting fx and medial patella impaction fracture of RLE. She underwent  arthroscopic assisted R patellar reduction with lateral release on 08/27/2023. Pt admitted to CIR from 09/02/23 - 09/15/23/ Pt just had follow-up with surgeon and has been instructed to wean from hinged knee brace/has been released from care; she is WBAT. Pt's mother reports pt using 2WW to ambulate for short distances: maybe ambulating 30-50 steps at most without brace. Pt reports ambulation is limited by R knee pain. Her knee hurts most when walking. She has difficulty completing STS but reports she can do this on her own, although not able to do this yet in her school's bathroom and would like to work on this. Pt was independent with ADLs prior to injury. Her mother is helping her now with dressing. She has not yet used steps to enter her home, is currently using her WC on ramp. They report no other falls. Pt wants to improve ability to ambulate for her school prom. She likes to watch movies.  Pt has Prader-Willi syndrome, intellectual delay.   PERTINENT HISTORY:   PMH also significant for DM II with neuropathy, HTN, thrombocytopenia, hypothyroidism, Prader-Willi syndrome.    PAIN:  Are you having pain?  None currently, takes medicine for her knee pain; no othe rpain  PRECAUTIONS:  Knee - WBAT  RED FLAGS: None   WEIGHT BEARING RESTRICTIONS:  Via surgeon note "1.  I recommended the patient be weightbearing as tolerated and wean from the hinged knee brace."  FALLS:  Has patient fallen in last 6 months? No  LIVING ENVIRONMENT: Lives with: lives with their family in one level home Stairs:  2 steps, handrails bilat  to enter home, but also has two ramps that she uses to enter home and is not currently using steps Has following equipment at home: RW  OCCUPATION:  Student   PLOF:  Independent  PATIENT GOALS:  Pt wants to walk to be able to go to school prom; she wants to be able to dance.  NEXT MD VISIT: Had follow-up today,  was released from care but can follow-up as needed.    OBJECTIVE:  Note: Objective measures were completed at Evaluation unless otherwise noted.  DIAGNOSTIC FINDINGS:  Via chart, note Linda Chambers, MD "R Knee radiographs:  10/18/2023: 2 views (wheelchair bound lateral and oblique radiographs with suboptimal positioning) were obtained. Appropriate images cannot be obtained due to patient's hesitation/unwillingness to transfer to x-ray table. Within these limitations, there does not appear to be a recurrent patellar dislocation. There is significant osteopenia. Impaction fracture about the lateral femoral condyle is also visualized. It does not appear to fully involve the articular weightbearing surface. "  PATIENT SURVEYS:  LEFS deferred  COGNITIVE STATUS: Impaired at baseline   SENSATION: WFL BLE  COORDINATION: WFL rapid alt LE, unable to do heel>shin likely limited by body habitus and some hesitance/faer of movement  EDEMA:  Pt R knee still with slight edema around patella, no redness present, scars healing well  POSTURE:  rounded shoulders, increased fwd posture/kyphosis possibly impacted by body habitus    GAIT: Distance walked: (see below) Assistive device utilized: Environmental consultant - 2 wheeled Level of assistance: CGA Comments: Decreased gait speed, antalgic gait, decreased weightbearing and stance  time on RLE, heavy BUE weightbearing through RW    Body Part #1 Knee  PALPATION: No pain with palpation superior, inferior, LTL patella, posterior knee RLE  LOWER EXTREMITY ROM:    Need to complete formal assessment, however pt exhibited to have impaired R hip flexion mobility with MMT, possibly due  positioning but needs further assessment.    LOWER EXTREMITY MMT:    MMT Right eval Left eval  Hip flexion 3- 3+  Hip extension    Hip abduction 4 4  Hip adduction 3* 4  Hip internal rotation    Hip external rotation    Knee flexion * 4  Knee extension 3* 4+  Ankle dorsiflexion 4 4  Ankle plantarflexion    Ankle inversion    Ankle eversion     (Blank rows = not tested) "*" indicates pain-limited. Pt also might be self-limiting due to fear of pain with movement     FUNCTIONAL TESTS:  5 times sit to stand: 42.5 sec heavy use of UE to RW  6 minute walk test: deferred tp future visit 10 meter walk test: 0.16 sec with BRW  Berg Balance Scale: deferred to future visit                                                                                                                        TREATMENT DATE:   *Spanish interpreter present for patient's mother during visit today.   TA: Standing in //bars:  Dynamic lateral weight shifting  x 25 reps without UE support to facilitate RLE weight bearing.   Static stand without UE support x 30 sec x 3 (observed patient weight shifting over to left LE so VC to even weight bearing with patient able to shift over some for more 50/50)   Step tap onto purple pad with BUE Support- x 20 reps alt LE (increased difficulty with weight shifting over to RLE- initially propping entire forearm onto support bar for assist- did improve with less UE Support with VC and practice)   Side step along support bar- down and back x 5 (VC for wide steps)   Step training - Up/down 4 steps with BUE support on rails- Close CGA assist and reminders which LE to  lead with ascending/descending.     Gait training: Patient ambulated a total of approx ~100 feet using RW, CGA and use of gait belt- patient  exhibiting continual step to gait - unable to progress to step through gait today despite Verbal cues and even Author pulling walker to keep her from increased UE weight bearing.  Still decreased RLE weight bearing and short hops.    Self care/Home management:  Education in Dentist with patient/mother/interpreter Education in [employer] bearing RLE- and gait sequencing - trying to add more weight through RLE and less through BUE        PATIENT EDUCATION:  Education details: assessment findings, goals, HEP, plan, safe technique with STS to walker Person educated: Patient and Parent Education method: Explanation, Demonstration, Tactile cues, Verbal cues, and Handouts Education comprehension: verbalized understanding, returned demonstration, verbal cues required, tactile cues required, and needs further education  HOME EXERCISE PROGRAM: Reviewed: safe technique, signs and symptoms to watch for that proceeding is OK, stop if feeling increased pain/any sharp pain in knee Access Code:  [email or web address] URL: [email or web address] Date: 10/18/2023 Prepared by: Aminta Kales  Exercises - Sit to Stand with Counter Support  - 1 x daily - 7 x weekly - 2 sets - 5 reps    ASSESSMENT:  CLINICAL IMPRESSION:  Treatment focused on activities that promote increased RLE weight bearing. She was able to perform all activities without report of any pain. She was able to demo some in session progress- increased RLE weight bearing with activities yet struggled the most to increase her weight bearing during walking. She was anxious to perform steps yet performed well with close CGA and VC for safety.  Pt will continue to benefit from skilled therapy to address remaining deficits in order to improve overall QoL and return to PLOF.      OBJECTIVE  IMPAIRMENTS: Abnormal gait, decreased activity tolerance, decreased balance, decreased coordination, decreased endurance, decreased mobility, difficulty walking, decreased ROM, decreased strength, increased edema, impaired flexibility, improper body mechanics, postural dysfunction, obesity, and pain.   ACTIVITY LIMITATIONS: carrying, lifting, bending, standing, squatting, stairs, transfers, toileting, dressing, and locomotion level  PARTICIPATION LIMITATIONS: meal prep, cleaning, laundry, shopping, community activity, yard work, and school  PERSONAL FACTORS: Fitness, Sex, and 3+ comorbidities: PMH significant for DM II with neuropathy, HTN, thrombocytopenia, hypothyroidism, Prader-Willi syndrome,   are also affecting patient's functional outcome.   REHAB POTENTIAL: Good  CLINICAL DECISION MAKING: Evolving/moderate complexity  EVALUATION COMPLEXITY: Moderate   GOALS: Goals reviewed with patient? Yes  SHORT TERM GOALS: Target date: 11/29/2023  Patient will be independent in home exercise program to improve strength/mobility for better functional independence with ADLs. Baseline: initiated Goal status: INITIAL    LONG TERM GOALS: Target date: 01/10/2024  Patient will increase lower extremity functional scale to >60/80 to demonstrate improved functional mobility and increased tolerance with ADLs. .  Baseline: deferred to future session Goal status: INITIAL  2.  Patient (> 60 years old) will complete five times sit to stand test in < 15 seconds indicating an increased LE strength and improved balance. Baseline: 42.5 sec, heavy use of BUE Goal status: INITIAL  3.  Patient will increase Berg Balance score by > 6 points to demonstrate decreased fall risk during functional activities Baseline: deferred to future session, may need to complete different balance test if pt has difficulty with cuing for test activities  Goal status: INITIAL  4.  Patient will increase 10 meter walk test to  >1.62m/s as to improve gait speed for better community ambulation and to reduce fall risk. Baseline: 0.16 with BRW Goal status: INITIAL  5.  Patient will increase six minute walk test distance to >1000 for progression to community ambulator and improve gait ability. Baseline:  280 ft with RW Goal status: INITIAL    PLAN:  PT FREQUENCY: 1-2x/week  PT DURATION: 12 weeks  PLANNED INTERVENTIONS: [id number]- PT Re-evaluation, 97750- Physical Performance Testing, 97110-Therapeutic exercises, 97530- Therapeutic activity, [id number]- Neuromuscular re-education, 97535- Self Care, [id number]- Manual therapy, [phone]- Gait training, [phone]- Orthotic Initial, [phone]- Orthotic/Prosthetic subsequent, [phone]- Canalith repositioning, [id number]- Splinting, [id number]- Electrical stimulation (unattended), [phone]- Electrical stimulation (manual), [id number]- Ultrasound, Patient/Family education, Balance training, Stair training, Taping, Joint mobilization, Joint manipulation, Spinal mobilization, Scar mobilization, Vestibular training, DME instructions, Wheelchair mobility training, Cryotherapy, and Moist heat.  PLAN FOR NEXT SESSION: Progress R knee ROM, RLE muscle strengthening and balance, progress weight bearing activities as appropriate.- add to HEP, and complete BERG as indicated/time permits   Ossie Blend, PT Physical Therapist - [facility]  10/29/23, 11:25  AM

## 2023-11-02 ENCOUNTER — Ambulatory Visit: Payer: MEDICAID | Admitting: Physical Therapy

## 2023-11-02 ENCOUNTER — Ambulatory Visit: Payer: MEDICAID | Admitting: Occupational Therapy

## 2023-11-02 DIAGNOSIS — R278 Other lack of coordination: Secondary | ICD-10-CM

## 2023-11-02 DIAGNOSIS — S83004A Unspecified dislocation of right patella, initial encounter: Secondary | ICD-10-CM | POA: Diagnosis not present

## 2023-11-02 DIAGNOSIS — R262 Difficulty in walking, not elsewhere classified: Secondary | ICD-10-CM

## 2023-11-02 DIAGNOSIS — M6281 Muscle weakness (generalized): Secondary | ICD-10-CM

## 2023-11-02 DIAGNOSIS — R2681 Unsteadiness on feet: Secondary | ICD-10-CM

## 2023-11-02 DIAGNOSIS — G8929 Other chronic pain: Secondary | ICD-10-CM

## 2023-11-02 NOTE — Therapy (Signed)
 OUTPATIENT PHYSICAL THERAPY LE TREATMENT   Patient Name: Linda Mason MRN: [id number] DOB:11-03-1995, 28 y.o., female Today's Date: 11/02/2023  END OF SESSION:  PT End of Session - 11/02/23 1020     Visit Number 5    Number of Visits 25    Date for PT Re-Evaluation 01/10/24    PT Start Time 1020    PT Stop Time 1100    PT Time Calculation (min) 40 min    Equipment Utilized During Treatment Gait belt    Activity Tolerance Patient tolerated treatment well;Other (comment)   pt throughout reporting fear of movement   Behavior During Therapy WFL for tasks assessed/performed              Past Medical History:  Diagnosis Date   Diabetes mellitus without complication [facility])    Past Surgical History:  Procedure Laterality Date   KNEE ARTHROSCOPY Right 08/27/2023   Procedure: ARTHROSCOPY KNEE;  Surgeon: [name], MD;  Location: [facility] ORS;  Service: Orthopedics;  Laterality: Right;   Patient Active Problem List   Diagnosis Date Noted   Closed patellar dislocation, right, initial encounter 09/01/2023   Patellar dislocation 08/26/2023   Type 2 diabetes mellitus without complication, with long-term current use of insulin  (HCC) 08/26/2023   Hypoxia 08/26/2023   Morbid obesity (HCC) 10/09/2021   Diabetes mellitus with neuropathy (HCC) 10/09/2021   Essential hypertension 10/09/2021   Thrombocytopenia (HCC) 10/09/2021   Hypothyroidism 10/09/2021   Prader-Willi syndrome 10/09/2021   Cellulitis 10/08/2021     [facility]P: [name]  REFERRING PROVIDER: [name], PA-C   REFERRING DIAG:  Diagnosis  S83.004A (ICD-10-CM) - Closed patellar dislocation, right, initial encounter    Rationale for Evaluation and Treatment: Rehabilitation  THERAPY DIAG:  Muscle weakness (generalized)  Chronic pain of right knee  Difficulty in walking, not elsewhere classified  Unsteadiness on feet  Closed patellar dislocation, right, initial encounter  Other  lack of coordination  ONSET DATE: 08/26/2023   SUBJECTIVE:                                                                                                                                                                                           SUBJECTIVE STATEMENT  Patient continues to report no pain at start of PT treatment. No changes medically.    The pt is a pleasant 28 yo female. She presents in [facility] with her mom and an interpreter present for PT eval. Pt suffered a R knee dislocation following a fall 08/26/2023. Pt also found to have lateral femoral condyle impacting fx and medial patella impaction fracture of RLE. She underwent  arthroscopic assisted R patellar reduction with lateral release on 08/27/2023. Pt admitted to CIR from 09/02/23 - 09/15/23/ Pt just had follow-up with surgeon and has been instructed to wean from hinged knee brace/has been released from care; she is WBAT. Pt's mother reports pt using 2WW to ambulate for short distances: maybe ambulating 30-50 steps at most without brace. Pt reports ambulation is limited by R knee pain. Her knee hurts most when walking. She has difficulty completing STS but reports she can do this on her own, although not able to do this yet in her school's bathroom and would like to work on this. Pt was independent with ADLs prior to injury. Her mother is helping her now with dressing. She has not yet used steps to enter her home, is currently using her WC on ramp. They report no other falls. Pt wants to improve ability to ambulate for her school prom. She likes to watch movies.  Pt has Prader-Willi syndrome, intellectual delay.   PERTINENT HISTORY:   PMH also significant for DM II with neuropathy, HTN, thrombocytopenia, hypothyroidism, Prader-Willi syndrome.    PAIN:  Are you having pain?  None currently, takes medicine for her knee pain; no othe rpain  PRECAUTIONS:  Knee - WBAT  RED FLAGS: None   WEIGHT BEARING RESTRICTIONS:  Via surgeon note  "1. I recommended the patient be weightbearing as tolerated and wean from the hinged knee brace."  FALLS:  Has patient fallen in last 6 months? No  LIVING ENVIRONMENT: Lives with: lives with their family in one level home Stairs:  2 steps, handrails bilat  to enter home, but also has two ramps that she uses to enter home and is not currently using steps Has following equipment at home: RW  OCCUPATION:  Student   PLOF:  Independent  PATIENT GOALS:  Pt wants to walk to be able to go to school prom; she wants to be able to dance.  NEXT MD VISIT: Had follow-up today,  was released from care but can follow-up as needed.    OBJECTIVE:  Note: Objective measures were completed at Evaluation unless otherwise noted.  DIAGNOSTIC FINDINGS:  Via chart, note [name], MD "R Knee radiographs:  10/18/2023: 2 views (wheelchair bound lateral and oblique radiographs with suboptimal positioning) were obtained. Appropriate images cannot be obtained due to patient's hesitation/unwillingness to transfer to x-ray table. Within these limitations, there does not appear to be a recurrent patellar dislocation. There is significant osteopenia. Impaction fracture about the lateral femoral condyle is also visualized. It does not appear to fully involve the articular weightbearing surface. "  PATIENT SURVEYS:  LEFS deferred  COGNITIVE STATUS: Impaired at baseline   SENSATION: WFL BLE  COORDINATION: WFL rapid alt LE, unable to do heel>shin likely limited by body habitus and some hesitance/faer of movement  EDEMA:  Pt R knee still with slight edema around patella, no redness present, scars healing well  POSTURE:  rounded shoulders, increased fwd posture/kyphosis possibly impacted by body habitus    GAIT: Distance walked: (see below) Assistive device utilized: Environmental consultant - 2 wheeled Level of assistance: CGA Comments: Decreased gait speed, antalgic gait, decreased weightbearing and  stance time on RLE, heavy BUE weightbearing through RW    Body Part #1 Knee  PALPATION: No pain with palpation superior, inferior, LTL patella, posterior knee RLE  LOWER EXTREMITY ROM:    Need to complete formal assessment, however pt exhibited to have impaired R hip flexion mobility with MMT, possibly due  positioning but needs further assessment.    LOWER EXTREMITY MMT:    MMT Right eval Left eval  Hip flexion 3- 3+  Hip extension    Hip abduction 4 4  Hip adduction 3* 4  Hip internal rotation    Hip external rotation    Knee flexion * 4  Knee extension 3* 4+  Ankle dorsiflexion 4 4  Ankle plantarflexion    Ankle inversion    Ankle eversion     (Blank rows = not tested) "*" indicates pain-limited. Pt also might be self-limiting due to fear of pain with movement     FUNCTIONAL TESTS:  5 times sit to stand: 42.5 sec heavy use of UE to RW  6 minute walk test: deferred tp future visit 10 meter walk test: 0.16 sec with BRW  Berg Balance Scale: deferred to future visit                                                                                                                        TREATMENT DATE:   *Spanish interpreter present for patient's mother during visit today.  Gait training:  Gait with RW 41ft x 2 with due to decreased force of heel contact on the LLE to increase weight shift on the RLE   TA:  Throughout session, sit<>stand with CGA with min cues for use of arm rest with transition to sitting and standing for improved sequencing of movement and reduced risk of posterior LOB with use of   Seated LAQ with 3 sec hold x 10 bil  Standing weight shift to move letters to target on board x 12 bil contralateral support on the RW.   Step up to airex pad leading with RLE x 3 and LLE x 3. UE support on RW.  Foot tap on airex pad with UE support on RW x 10 bil  Standing on airex pad: with intermittent UE support 3 x 15.  No UE support 2 x 20 sec.  Slight weight  shift R and L x 6 bil    CGA throughout session for safety with min cues for posture when standing at white board or on airex pad. No LOB note, and improved stand time on the RLE as well as step length on the LLE with cues from PT.   PATIENT EDUCATION:  Education details: assessment findings, goals, HEP, plan, safe technique with STS to walker Person educated: Patient and Parent Education method: Explanation, Demonstration, Tactile cues, Verbal cues, and Handouts Education comprehension: verbalized understanding, returned demonstration, verbal cues required, tactile cues required, and needs further education  HOME EXERCISE PROGRAM: Reviewed: safe technique, signs and symptoms to watch for that proceeding is OK, stop if feeling increased pain/any sharp pain in knee Access Code: [email or web address] URL: [email or web address] Date: 10/18/2023 Prepared by: Aminta Kales  Exercises - Sit to Stand with Counter Support  - 1 x daily - 7 x weekly - 2 sets - 5 reps    ASSESSMENT:  CLINICAL  IMPRESSION:  Treatment focused on activities that promote increased RLE weight bearing. She was able to perform all activities without report of any pain. Improved weight shift noted on the RLE with gait and standing on unlevel surface with increased repetitions. Mildly improved step length also noted on the LLE compared to prior session due to improved tolerance of WB on the RLE.  Pt will continue to benefit from skilled therapy to address remaining deficits in order to improve overall QoL and return to PLOF.      OBJECTIVE IMPAIRMENTS: Abnormal gait, decreased activity tolerance, decreased balance, decreased coordination, decreased endurance, decreased mobility, difficulty walking, decreased ROM, decreased strength, increased edema, impaired flexibility, improper body mechanics, postural dysfunction, obesity, and pain.   ACTIVITY LIMITATIONS: carrying, lifting, bending, standing, squatting, stairs,  transfers, toileting, dressing, and locomotion level  PARTICIPATION LIMITATIONS: meal prep, cleaning, laundry, shopping, community activity, yard work, and school  PERSONAL FACTORS: Fitness, Sex, and 3+ comorbidities: PMH significant for DM II with neuropathy, HTN, thrombocytopenia, hypothyroidism, Prader-Willi syndrome,   are also affecting patient's functional outcome.   REHAB POTENTIAL: Good  CLINICAL DECISION MAKING: Evolving/moderate complexity  EVALUATION COMPLEXITY: Moderate   GOALS: Goals reviewed with patient? Yes  SHORT TERM GOALS: Target date: 11/29/2023  Patient will be independent in home exercise program to improve strength/mobility for better functional independence with ADLs. Baseline: initiated Goal status: INITIAL    LONG TERM GOALS: Target date: 01/10/2024  Patient will increase lower extremity functional scale to >60/80 to demonstrate improved functional mobility and increased tolerance with ADLs. .  Baseline: deferred to future session Goal status: INITIAL  2.  Patient (> 92 years old) will complete five times sit to stand test in < 15 seconds indicating an increased LE strength and improved balance. Baseline: 42.5 sec, heavy use of BUE Goal status: INITIAL  3.  Patient will increase Berg Balance score by > 6 points to demonstrate decreased fall risk during functional activities Baseline: deferred to future session, may need to complete different balance test if pt has difficulty with cuing for test activities  Goal status: INITIAL  4.  Patient will increase 10 meter walk test to >1.65m/s as to improve gait speed for better community ambulation and to reduce fall risk. Baseline: 0.16 with BRW Goal status: INITIAL  5.  Patient will increase six minute walk test distance to >1000 for progression to community ambulator and improve gait ability. Baseline:  280 ft with RW Goal status: INITIAL    PLAN:  PT FREQUENCY: 1-2x/week  PT DURATION: 12  weeks  PLANNED INTERVENTIONS: 97164- PT Re-evaluation, 97750- Physical Performance Testing, 97110-Therapeutic exercises, 97530- Therapeutic activity, [id number]- Neuromuscular re-education, 97535- Self Care, [id number]- Manual therapy, [phone]- Gait training, [phone]- Orthotic Initial, [phone]- Orthotic/Prosthetic subsequent, [phone]- Canalith repositioning, [id number]- Splinting, [id number]- Electrical stimulation (unattended), [phone]- Electrical stimulation (manual), [id number]- Ultrasound, Patient/Family education, Balance training, Stair training, Taping, Joint mobilization, Joint manipulation, Spinal mobilization, Scar mobilization, Vestibular training, DME instructions, Wheelchair mobility training, Cryotherapy, and Moist heat.  PLAN FOR NEXT SESSION:   Progress R knee ROM, RLE muscle strengthening and balance, progress weight bearing activities as appropriate. - add to HEP, and complete BERG as indicated/time permits   Aurora Lees PT, DPT  Physical Therapist - [facility]  11:42 AM 11/02/23

## 2023-11-03 NOTE — Therapy (Signed)
 OUTPATIENT OCCUPATIONAL THERAPY ORTHO TREATMENT  Patient Name: Linda Mason MRN: [id number] DOB:Nov 29, 1995, 28 y.o., female Today's Date: 11/03/2023  [facility] REFERRING PROVIDER: Georjean Kite  END OF SESSION:  OT End of Session - 11/03/23 1130     Visit Number 6    Date for OT Re-Evaluation 01/06/24    OT Start Time 0845    OT Stop Time 0930    OT Time Calculation (min) 45 min    Activity Tolerance Patient tolerated treatment well    Behavior During Therapy [facility] for tasks assessed/performed            Past Medical History:  Diagnosis Date   Diabetes mellitus without complication [facility])    Past Surgical History:  Procedure Laterality Date   KNEE ARTHROSCOPY Right 08/27/2023   Procedure: ARTHROSCOPY KNEE;  Surgeon: [name], MD;  Location: [facility] ORS;  Service: Orthopedics;  Laterality: Right;   Patient Active Problem List   Diagnosis Date Noted   Closed patellar dislocation, right, initial encounter 09/01/2023   Patellar dislocation 08/26/2023   Type 2 diabetes mellitus without complication, with long-term current use of insulin  (HCC) 08/26/2023   Hypoxia 08/26/2023   Morbid obesity (HCC) 10/09/2021   Diabetes mellitus with neuropathy (HCC) 10/09/2021   Essential hypertension 10/09/2021   Thrombocytopenia (HCC) 10/09/2021   Hypothyroidism 10/09/2021   Prader-Willi syndrome 10/09/2021   Cellulitis 10/08/2021   ONSET DATE: 08/26/2023  REFERRING DIAG: Right knee patella fracture  THERAPY DIAG:  Muscle weakness (generalized)  Rationale for Evaluation and Treatment: Rehabilitation  SUBJECTIVE:   SUBJECTIVE STATEMENT:  Pt. Was tearful about missing her PT session this morning. Pt. Was assisted with rescheduling her appointment for later in the morning  Pt accompanied by: family member and interpreter: Camila - Spanish interpreter  PERTINENT HISTORY: TEARSA SOCK is a 28 year old right-handed morbidly obese female  and BMI 44.88 as well as diabetes mellitus, Prader-Willi syndrome, intellectual delay. Presented to [facility] ED on 08/26/2023 after mechanical fall getting out of her bed. X-rays and imaging revealed right knee irreducible patellar dislocation right knee lateral femoral condyle impacting fracture as well as right knee medial patella impaction fracture. Pt underwent right knee arthroscopic assisted patellar reduction with lateral release on 08/27/2023 per Dr. [name].  Weightbearing as tolerated right lower extremity knee immobilizer when out of bed or when walking. Pt admitted to CIR from 09/02/23 - 09/15/23  PRECAUTIONS: Knee , Knee Immobilizer    WEIGHT BEARING RESTRICTIONS: Yes WBAT  PAIN:  Are you having pain? Yes: NPRS scale: 0/10 Pain location: knee  FALLS: Has patient fallen in last 6 months? No  LIVING ENVIRONMENT: Lives with: lives with their family Lives in: House/apartment Stairs: No ramp Has following equipment at home: Wheelchair (manual)  PLOF: Independent with basic ADLs  PATIENT GOALS: to return to school and go to [employer] with no fear of falling   NEXT MD VISIT: none  OBJECTIVE:  Note: Objective measures were completed at Evaluation unless otherwise noted.  HAND DOMINANCE: Left  ADLs: Transfers/ambulation related to ADLs: SUPERVISION short distances using RW, MOD I using w/c Eating: IND Grooming: IND Upper body dressing: SETUP assist Lower body dressing: MAX A Toileting: SUPERVISION Bathing: MOD A bathing   Tub shower transfers: SUPERVISION Equipment: Shower seat with back, Walk in shower, and bed side commode  UPPER EXTREMITY ROM:     Active ROM Right  WFL Left  WFL    UPPER EXTREMITY MMT:   Question accuracy given  difficulty following commands   MMT Right eval Left eval  Shoulder flexion 3+/5 3+/5  Shoulder abduction    Elbow flexion 3+/5 3+/5  Elbow extension 3+/5 3+/5  Wrist flexion    Wrist extension    (Blank rows = not tested)  HAND  FUNCTION: Grip strength: Right: 10 lbs; Left: 15 lbs, Lateral pinch: Right: 4 lbs, Left: 5 lbs, and 3 point pinch: Right: 1 lbs, Left: 1 lbs  COORDINATION: 9 Hole Peg test: Right: 40 sec; Left: 30 sec  SENSATION: Light touch: Impaired  tingling  EDEMA: none  COGNITION: Overall cognitive status: History of cognitive impairments - at baseline Areas of impairment: Areas of impairment: Problem solving  Commands: Follows one step commands with increased time Behavior: Perseveration and anxious  OBSERVATIONS: Pt appears anxious and looking toward her mother for answers to questions.    TREATMENT DATE: 11/02/23  Therapeutic Ex.:   -Performed BUE strengthening using a 2# dowel ex. 2/2 to weakness. Bilateral shoulder flexion, chest press, circular patterns, and elbow flexion/extension for 1 set  10 reps each, followed by 2.5# for 1 set 10 reps each.  Self Care:  -Pt. walked from the OT clinic to the clinic bathroom using her walker with CGA, and back to the OT clinic. Pt. was able to perform sit to stand on and off the commode using the grab, and walker with CGA. -Assisted Pt. and mother with problem solving through strategies for toileting success at school -SBA to perform hand hygiene while standing at the sink.    PATIENT EDUCATION: Education details: Bathroom mobility progressions with less use of wc and increased use of walker Person educated: Counselling psychologist, pt Education method: Medical illustrator Education comprehension: verbalized understanding, mother receptive  HOME EXERCISE PROGRAM:  Theraputty  GOALS: Goals reviewed with patient? Yes    SHORT TERM GOALS: Target date: 11/24/23  Pt will INDEPENDENTLY complete HEP  Baseline: 10/14/23: no HEP issued Goal status: INITIAL  LONG TERM GOALS: Target date: 01/06/24  Pt will increase B strength by 15# to assist with gripping grab bar for safe transfers   Baseline: 10/14/23: L 15#, R 10#  Goal status: INITIAL  2.  Pt will  INDEPENDENTLY transfer to regular height toilet  Baseline: SUPERVISION + RW for elevated toilet, MIN A + RW for regular toilet Goal status: INITIAL  3.  Pt will improve B FMC by decreasing 9 hole PEG test score by 10 seconds for participation in leisure activities (coloring).  Baseline: L 30", R 40" Goal status: INITIAL  4.  Pt will verbalize x3 strategies to manage anxiousness and fear of falling to reduce fall risk. Baseline: 0/3 strategies.  Goal status: INITIAL  5.  Pt will increase B lateral pinch strength by 5# to manage ADL items.  Baseline: R 4#, L 5# Goal status: INITIAL   ASSESSMENT:  CLINICAL IMPRESSION:  Pt. was tearful, and very distracted this morning about missing her PT session prior to OT. Pt. was assisted with rescheduling her PT appointment later in the morning. Pt. required cues to extend her right knee when going from standing to sitting on the toilet. Pt. presented with no LOB in standing, or when using the walker. Discussed attempting toileting skills next session in the main [place] hospital bathroom with stalls which are set-up more similar to the bathrooms at school. Pt. continues to have anxiety with, and reports being scared to scared to using the bathrooms at school. Pt./caregiver are in agreement with trying the bathroom with  stalls here at the hospital. Pt. continues to benefit from OT services to improve bilateral strength/dexterity, toilet t/f's/bathroom ADLs/functional mobility within bathroom with wc vs walker for safe return to school.   PERFORMANCE DEFICITS: in functional skills including ADLs, coordination, strength, pain, and balance, cognitive skills including attention and problem solving, and psychosocial skills including coping strategies and environmental adaptation.   IMPAIRMENTS: are limiting patient from ADLs and leisure.   COMORBIDITIES: has co-morbidities such as Prader-Willi syndrome, intellectual delay  that affect occupational  performance. Patient will benefit from skilled OT to address above impairments and improve overall function.  MODIFICATION OR ASSISTANCE TO COMPLETE EVALUATION: Min-Moderate modification of tasks or assist with assess necessary to complete an evaluation.  OT OCCUPATIONAL PROFILE AND HISTORY: Detailed assessment: Review of records and additional review of physical, cognitive, psychosocial history related to current functional performance.  CLINICAL DECISION MAKING: Moderate - several treatment options, min-mod task modification necessary  REHAB POTENTIAL: Good  EVALUATION COMPLEXITY: Moderate      PLAN:  OT FREQUENCY: 1-2x/week  OT DURATION: 12 weeks  PLANNED INTERVENTIONS: 97535 self care/ADL training, 09811 therapeutic exercise, 97530 therapeutic activity, patient/family education, and DME and/or AE instructions  RECOMMENDED OTHER SERVICES: PT  CONSULTED AND AGREED WITH PLAN OF CARE: Patient and family member/caregiver  PLAN FOR NEXT SESSION: ADLs  Duey Ghent, MS, OTR/L   11/03/2023, 11:32 AM

## 2023-11-04 ENCOUNTER — Ambulatory Visit: Payer: MEDICAID | Admitting: Physical Therapy

## 2023-11-04 ENCOUNTER — Ambulatory Visit: Payer: MEDICAID | Attending: Physician Assistant | Admitting: Occupational Therapy

## 2023-11-04 DIAGNOSIS — M25561 Pain in right knee: Secondary | ICD-10-CM | POA: Diagnosis present

## 2023-11-04 DIAGNOSIS — R2681 Unsteadiness on feet: Secondary | ICD-10-CM

## 2023-11-04 DIAGNOSIS — M6281 Muscle weakness (generalized): Secondary | ICD-10-CM | POA: Diagnosis present

## 2023-11-04 DIAGNOSIS — G8929 Other chronic pain: Secondary | ICD-10-CM

## 2023-11-04 DIAGNOSIS — S83004A Unspecified dislocation of right patella, initial encounter: Secondary | ICD-10-CM | POA: Diagnosis present

## 2023-11-04 DIAGNOSIS — R262 Difficulty in walking, not elsewhere classified: Secondary | ICD-10-CM

## 2023-11-04 DIAGNOSIS — R278 Other lack of coordination: Secondary | ICD-10-CM | POA: Insufficient documentation

## 2023-11-04 NOTE — Therapy (Addendum)
 OUTPATIENT PHYSICAL THERAPY LE TREATMENT   Patient Name: Linda Mason MRN: [id number] DOB:July 27, 1995, 28 y.o., female Today's Date: 11/18/2023  END OF SESSION:   Visit Number 6    Number of Visits 25    Date for PT Re-Evaluation 01/10/24    PT Start Time 0812    PT Stop Time 0844   PT Time Calculation (min) 31 min    Equipment Utilized During Treatment Gait belt    Activity Tolerance Patient tolerated treatment well;Other (comment)   pt throughout reporting fear of movement   Behavior During Therapy WFL for tasks assessed/performed      Past Medical History:  Diagnosis Date   Diabetes mellitus without complication [facility])    Past Surgical History:  Procedure Laterality Date   KNEE ARTHROSCOPY Right 08/27/2023   Procedure: ARTHROSCOPY KNEE;  Surgeon: [name], MD;  Location: [facility] ORS;  Service: Orthopedics;  Laterality: Right;   Patient Active Problem List   Diagnosis Date Noted   Closed patellar dislocation, right, initial encounter 09/01/2023   Patellar dislocation 08/26/2023   Type 2 diabetes mellitus without complication, with long-term current use of insulin  (HCC) 08/26/2023   Hypoxia 08/26/2023   Morbid obesity (HCC) 10/09/2021   Diabetes mellitus with neuropathy (HCC) 10/09/2021   Essential hypertension 10/09/2021   Thrombocytopenia (HCC) 10/09/2021   Hypothyroidism 10/09/2021   Prader-Willi syndrome 10/09/2021   Cellulitis 10/08/2021     [facility]P: [name]  REFERRING PROVIDER: [name], PA-C   REFERRING DIAG:  Diagnosis  S83.004A (ICD-10-CM) - Closed patellar dislocation, right, initial encounter    Rationale for Evaluation and Treatment: Rehabilitation  THERAPY DIAG:  Muscle weakness (generalized)  Chronic pain of right knee  Difficulty in walking, not elsewhere classified  Unsteadiness on feet  Closed patellar dislocation, right, initial encounter  ONSET DATE: 08/26/2023   SUBJECTIVE:                                                                                                                                                                                            SUBJECTIVE STATEMENT  Patient continues to report no pain at start of PT treatment. No changes medically.    The pt is a pleasant 28 yo female. She presents in [facility] with her mom and an interpreter present for PT eval. Pt suffered a R knee dislocation following a fall 08/26/2023. Pt also found to have lateral femoral condyle impacting fx and medial patella impaction fracture of RLE. She underwent arthroscopic assisted R patellar reduction with lateral release on 08/27/2023. Pt admitted to CIR from 09/02/23 - 09/15/23/ Pt just had follow-up with surgeon  and has been instructed to wean from hinged knee brace/has been released from care; she is WBAT. Pt's mother reports pt using 2WW to ambulate for short distances: maybe ambulating 30-50 steps at most without brace. Pt reports ambulation is limited by R knee pain. Her knee hurts most when walking. She has difficulty completing STS but reports she can do this on her own, although not able to do this yet in her school's bathroom and would like to work on this. Pt was independent with ADLs prior to injury. Her mother is helping her now with dressing. She has not yet used steps to enter her home, is currently using her WC on ramp. They report no other falls. Pt wants to improve ability to ambulate for her school prom. She likes to watch movies.  Pt has Prader-Willi syndrome, intellectual delay.   PERTINENT HISTORY:   PMH also significant for DM II with neuropathy, HTN, thrombocytopenia, hypothyroidism, Prader-Willi syndrome.    PAIN:  Are you having pain? None currently, takes medicine for her knee pain; no othe rpain  PRECAUTIONS:  Knee - WBAT  RED FLAGS: None   WEIGHT BEARING RESTRICTIONS:  Via surgeon note "1. I recommended the patient be weightbearing as tolerated and wean from the  hinged knee brace."  FALLS:  Has patient fallen in last 6 months? No  LIVING ENVIRONMENT: Lives with: lives with their family in one level home Stairs: 2 steps, handrails bilat to enter home, but also has two ramps that she uses to enter home and is not currently using steps Has following equipment at home: RW  OCCUPATION:  Student   PLOF:  Independent  PATIENT GOALS:  Pt wants to walk to be able to go to school prom; she wants to be able to dance.  NEXT MD VISIT: Had follow-up today,  was released from care but can follow-up as needed.    OBJECTIVE:  Note: Objective measures were completed at Evaluation unless otherwise noted.  DIAGNOSTIC FINDINGS:  Via chart, note [name], MD "R Knee radiographs:  10/18/2023: 2 views (wheelchair bound lateral and oblique radiographs with suboptimal positioning) were obtained. Appropriate images cannot be obtained due to patient's hesitation/unwillingness to transfer to x-ray table. Within these limitations, there does not appear to be a recurrent patellar dislocation. There is significant osteopenia. Impaction fracture about the lateral femoral condyle is also visualized. It does not appear to fully involve the articular weightbearing surface. "  PATIENT SURVEYS:  LEFS deferred  COGNITIVE STATUS: Impaired at baseline   SENSATION: WFL BLE  COORDINATION: WFL rapid alt LE, unable to do heel>shin likely limited by body habitus and some hesitance/faer of movement  EDEMA:  Pt R knee still with slight edema around patella, no redness present, scars healing well  POSTURE:  rounded shoulders, increased fwd posture/kyphosis possibly impacted by body habitus    GAIT: Distance walked: (see below) Assistive device utilized: Environmental consultant - 2 wheeled Level of assistance: CGA Comments: Decreased gait speed, antalgic gait, decreased weightbearing and stance time on RLE, heavy BUE weightbearing through RW    Body Part #1  Knee  PALPATION: No pain with palpation superior, inferior, LTL patella, posterior knee RLE  LOWER EXTREMITY ROM:    Need to complete formal assessment, however pt exhibited to have impaired R hip flexion mobility with MMT, possibly due positioning but needs further assessment.    LOWER EXTREMITY MMT:    MMT Right eval Left eval  Hip flexion 3- 3+  Hip extension  Hip abduction 4 4  Hip adduction 3* 4  Hip internal rotation    Hip external rotation    Knee flexion * 4  Knee extension 3* 4+  Ankle dorsiflexion 4 4  Ankle plantarflexion    Ankle inversion    Ankle eversion     (Blank rows = not tested) "*" indicates pain-limited. Pt also might be self-limiting due to fear of pain with movement     FUNCTIONAL TESTS:  5 times sit to stand: 42.5 sec heavy use of UE to RW  6 minute walk test: deferred tp future visit 10 meter walk test: 0.16 sec with BRW  Berg Balance Scale: deferred to future visit                                                                                                                        TREATMENT DATE:   *Spanish interpreter present for patient's mother during visit today.  Gait training:  Gait with RW 93ft x 2 with decreased WB through R LE  TA and TE:  Throughout session, sit<>stand with CGA with min cues for use of arm rest with transition to sitting and standing for improved sequencing of movement and reduced risk of posterior LOB with use of   Seated LAQ with 3 sec hold x 10 bil Seated knee extended hip AB/ ADD 2 x 10, poor ability to follow external cue of lifting LE over bolster   Standing weight shift tin mirror with feet in self selected stance x 10 ea side  Standing with RLE weight shift in spoit stance x 1 min, cues for erect posture  Forward and retro gait with walker x 10 ft ea way, step to gait pattern maintained throughout  REST Same as above with cues and visual demonstration for moving walker constantly to improve to  more step through gait, very unsuccessful and pt shows no change in gait pattern.   Foot tap to 6 in step with UE support x 8 ea LE, HEAVY UE assist when standing on R LE despite cues to do otherwise    R Hip ABD 2 x 10 in standing   Gait training   Gait with RW to OT gym for OT session ( 100 ft), improved speed, still poor RLE weight shift   Unless otherwise stated, SBA was provided and gait belt donned in order to ensure pt safety   PATIENT EDUCATION:  Education details: assessment findings, goals, HEP, plan, safe technique with STS to walker Person educated: Patient and Parent Education method: Explanation, Demonstration, Tactile cues, Verbal cues, and Handouts Education comprehension: verbalized understanding, returned demonstration, verbal cues required, tactile cues required, and needs further education  HOME EXERCISE PROGRAM: Reviewed: safe technique, signs and symptoms to watch for that proceeding is OK, stop if feeling increased pain/any sharp pain in knee Access Code: [email or web address] URL: [email or web address] Date: 10/18/2023 Prepared by: Aminta Kales  Exercises - Sit to Stand with Counter Support  - 1 x daily -  7 x weekly - 2 sets - 5 reps    ASSESSMENT:  CLINICAL IMPRESSION:  Treatment focused on activities that promote increased RLE weight bearing. She was able to perform all activities without report of any pain.  Still  had poor weightbearing on right lower extremity with gait but did show improvement in gait speed from beginning to end of session.  Pt had poor response to verbal and visual cues for R LE WB. Will continue to assess what method to improve R LE WB is effective. Was that pt will continue to benefit from skilled therapy to address remaining deficits in order to improve overall QoL and return to PLOF.    Note: Portions of this document were prepared using Dragon voice recognition software and although reviewed may contain unintentional dictation  errors in syntax, grammar, or spelling.   OBJECTIVE IMPAIRMENTS: Abnormal gait, decreased activity tolerance, decreased balance, decreased coordination, decreased endurance, decreased mobility, difficulty walking, decreased ROM, decreased strength, increased edema, impaired flexibility, improper body mechanics, postural dysfunction, obesity, and pain.   ACTIVITY LIMITATIONS: carrying, lifting, bending, standing, squatting, stairs, transfers, toileting, dressing, and locomotion level  PARTICIPATION LIMITATIONS: meal prep, cleaning, laundry, shopping, community activity, yard work, and school  PERSONAL FACTORS: Fitness, Sex, and 3+ comorbidities: PMH significant for DM II with neuropathy, HTN, thrombocytopenia, hypothyroidism, Prader-Willi syndrome,  are also affecting patient's functional outcome.   REHAB POTENTIAL: Good  CLINICAL DECISION MAKING: Evolving/moderate complexity  EVALUATION COMPLEXITY: Moderate   GOALS: Goals reviewed with patient? Yes  SHORT TERM GOALS: Target date: 11/29/2023  Patient will be independent in home exercise program to improve strength/mobility for better functional independence with ADLs. Baseline: initiated Goal status: INITIAL    LONG TERM GOALS: Target date: 01/10/2024  Patient will increase lower extremity functional scale to >60/80 to demonstrate improved functional mobility and increased tolerance with ADLs. .  Baseline: deferred to future session Goal status: INITIAL  2.  Patient (> 76 years old) will complete five times sit to stand test in < 15 seconds indicating an increased LE strength and improved balance. Baseline: 42.5 sec, heavy use of BUE Goal status: INITIAL  3.  Patient will increase Berg Balance score by > 6 points to demonstrate decreased fall risk during functional activities Baseline: deferred to future session, may need to complete different balance test if pt has difficulty with cuing for test activities  Goal status:  INITIAL  4.  Patient will increase 10 meter walk test to >1.94m/s as to improve gait speed for better community ambulation and to reduce fall risk. Baseline: 0.16 with BRW Goal status: INITIAL  5.  Patient will increase six minute walk test distance to >1000 for progression to community ambulator and improve gait ability. Baseline:  280 ft with RW Goal status: INITIAL    PLAN:  PT FREQUENCY: 1-2x/week  PT DURATION: 12 weeks  PLANNED INTERVENTIONS: 97164- PT Re-evaluation, 97750- Physical Performance Testing, 97110-Therapeutic exercises, 97530- Therapeutic activity, [id number]- Neuromuscular re-education, 97535- Self Care, 16109- Manual therapy, [phone]- Gait training, [phone]- Orthotic Initial, [phone]- Orthotic/Prosthetic subsequent, [phone]- Canalith repositioning, [id number]- Splinting, [id number]- Electrical stimulation (unattended), [phone]- Electrical stimulation (manual), [id number]- Ultrasound, Patient/Family education, Balance training, Stair training, Taping, Joint mobilization, Joint manipulation, Spinal mobilization, Scar mobilization, Vestibular training, DME instructions, Wheelchair mobility training, Cryotherapy, and Moist heat.  PLAN FOR NEXT SESSION:   Progress R knee ROM, RLE muscle strengthening and balance, progress weight bearing activities as appropriate. - add to HEP, and complete BERG as indicated/time permits   Veryl Gottron B  Marnie Siren ,DPT Physical Therapist- [facility]   8:09 AM 11/18/23

## 2023-11-04 NOTE — Therapy (Addendum)
 OUTPATIENT OCCUPATIONAL THERAPY ORTHO TREATMENT  Patient Name: Linda Mason MRN: [id number] DOB:July 30, 1995, 28 y.o., female Today's Date: 11/04/2023  [name] REFERRING PROVIDER: Georjean Kite  END OF SESSION:  OT End of Session - 11/04/23 1138     Visit Number 7    Number of Visits 24    Date for OT Re-Evaluation 01/06/24    OT Start Time 0845    OT Stop Time 0930    OT Time Calculation (min) 45 min    Activity Tolerance Patient tolerated treatment well    Behavior During Therapy [facility] for tasks assessed/performed            Past Medical History:  Diagnosis Date   Diabetes mellitus without complication (HCC)    Past Surgical History:  Procedure Laterality Date   KNEE ARTHROSCOPY Right 08/27/2023   Procedure: ARTHROSCOPY KNEE;  Surgeon: [name], MD;  Location: [facility] ORS;  Service: Orthopedics;  Laterality: Right;   Patient Active Problem List   Diagnosis Date Noted   Closed patellar dislocation, right, initial encounter 09/01/2023   Patellar dislocation 08/26/2023   Type 2 diabetes mellitus without complication, with long-term current use of insulin  (HCC) 08/26/2023   Hypoxia 08/26/2023   Morbid obesity (HCC) 10/09/2021   Diabetes mellitus with neuropathy (HCC) 10/09/2021   Essential hypertension 10/09/2021   Thrombocytopenia (HCC) 10/09/2021   Hypothyroidism 10/09/2021   Prader-Willi syndrome 10/09/2021   Cellulitis 10/08/2021   ONSET DATE: 08/26/2023  REFERRING DIAG: Right knee patella fracture  THERAPY DIAG:  Muscle weakness (generalized)  Rationale for Evaluation and Treatment: Rehabilitation  SUBJECTIVE:   SUBJECTIVE STATEMENT:  Pt. reports that she was able to have PT this morning.   Pt accompanied by: family member and interpreter: Camila - Spanish interpreter  PERTINENT HISTORY: Linda Mason is a 28 year old right-handed morbidly obese female and BMI 44.88 as well as diabetes mellitus,  Prader-Willi syndrome, intellectual delay. Presented to [facility] ED on 08/26/2023 after mechanical fall getting out of her bed. X-rays and imaging revealed right knee irreducible patellar dislocation right knee lateral femoral condyle impacting fracture as well as right knee medial patella impaction fracture. Pt underwent right knee arthroscopic assisted patellar reduction with lateral release on 08/27/2023 per Dr. [name].  Weightbearing as tolerated right lower extremity knee immobilizer when out of bed or when walking. Pt admitted to CIR from 09/02/23 - 09/15/23  PRECAUTIONS: Knee , Knee Immobilizer    WEIGHT BEARING RESTRICTIONS: Yes WBAT  PAIN:  Are you having pain? Yes: NPRS scale: 0/10 Pain location: knee  FALLS: Has patient fallen in last 6 months? No  LIVING ENVIRONMENT: Lives with: lives with their family Lives in: House/apartment Stairs: No ramp Has following equipment at home: Wheelchair (manual)  PLOF: Independent with basic ADLs  PATIENT GOALS: to return to school and go to [employer] with no fear of falling   NEXT MD VISIT: none  OBJECTIVE:  Note: Objective measures were completed at Evaluation unless otherwise noted.  HAND DOMINANCE: Left  ADLs: Transfers/ambulation related to ADLs: SUPERVISION short distances using RW, MOD I using w/c Eating: IND Grooming: IND Upper body dressing: SETUP assist Lower body dressing: MAX A Toileting: SUPERVISION Bathing: MOD A bathing   Tub shower transfers: SUPERVISION Equipment: Shower seat with back, Walk in shower, and bed side commode  UPPER EXTREMITY ROM:     Active ROM Right  WFL Left  WFL    UPPER EXTREMITY MMT:   Question accuracy given difficulty following commands  MMT Right eval Left eval  Shoulder flexion 3+/5 3+/5  Shoulder abduction    Elbow flexion 3+/5 3+/5  Elbow extension 3+/5 3+/5  Wrist flexion    Wrist extension    (Blank rows = not tested)  HAND FUNCTION: Grip strength: Right: 10 lbs; Left:  15 lbs, Lateral pinch: Right: 4 lbs, Left: 5 lbs, and 3 point pinch: Right: 1 lbs, Left: 1 lbs  COORDINATION: 9 Hole Peg test: Right: 40 sec; Left: 30 sec  SENSATION: Light touch: Impaired  tingling  EDEMA: none  COGNITION: Overall cognitive status: History of cognitive impairments - at baseline Areas of impairment: Areas of impairment: Problem solving  Commands: Follows one step commands with increased time Behavior: Perseveration and anxious  OBSERVATIONS: Pt appears anxious and looking toward her mother for answers to questions.    TREATMENT DATE: 11/04/23  Self Care:  -Practiced toileting skills using the public bathroom with multiple stalls in it similar to  the bathroom set-up at school.  -Practiced walking within the bathroom, and accessing the toilet using a walker with SBA.  -Supervision for sit/stand on and off the toilet 2/2 the higher seat in the stall bathroom. -Also practiced navigating the w/c through the bathroom to access, and transfer to and from the commode -Pt. required minA  for w/c management requiring verbal cues to lock the brakes, and assist to manage the w/c leg rests. -SBA to perform hand hygiene while standing at the sink.  -CGA to walk 225 ft with Rolling walker from the hospital lower level public bathroom with the stalls to the OT clinic.    PATIENT EDUCATION: Education details: Bathroom mobility progressions with less use of wc and increased use of walker Person educated: Counselling psychologist, pt Education method: Medical illustrator Education comprehension: verbalized understanding, mother receptive  HOME EXERCISE PROGRAM:  Theraputty  GOALS: Goals reviewed with patient? Yes    SHORT TERM GOALS: Target date: 11/24/23  Pt will INDEPENDENTLY complete HEP  Baseline: 10/14/23: no HEP issued Goal status: INITIAL  LONG TERM GOALS: Target date: 01/06/24  Pt will increase B strength by 15# to assist with gripping grab bar for safe transfers    Baseline: 10/14/23: L 15#, R 10#  Goal status: INITIAL  2.  Pt will INDEPENDENTLY transfer to regular height toilet  Baseline: SUPERVISION + RW for elevated toilet, MIN A + RW for regular toilet Goal status: INITIAL  3.  Pt will improve B FMC by decreasing 9 hole PEG test score by 10 seconds for participation in leisure activities (coloring).  Baseline: L 30", R 40" Goal status: INITIAL  4.  Pt will verbalize x3 strategies to manage anxiousness and fear of falling to reduce fall risk. Baseline: 0/3 strategies.  Goal status: INITIAL  5.  Pt will increase B lateral pinch strength by 5# to manage ADL items.  Baseline: R 4#, L 5# Goal status: INITIAL   ASSESSMENT:  CLINICAL IMPRESSION:  Pt. is making progress. Pt. required SBA with a RW to navigate through the LL public bathroom to the largest stall located in the back of the bathroom.  SBA to perform hand hygiene standing at the sinkside. Pt. also attempted to navigate through the same area with the manual w/c. Pt. required minA to turn the w/c to maneuver within the stall. Pt. required verbal cues to lock each brake, and required assist to manage the foot rests. Pt. required CGA to walk 225 feet from the bathroom to the OT gym with no rest breaks  being required. Pt.'s mother reports planning to review, and practice toileting skills at school. It is recommended that the Pt. use the w/c currently at school, and have the walker accessible for bathroom use. Pt. does require cues, and assist to manage leg rests, and w/c breaks. Pt. continues to benefit from OT services to improve bilateral strength/dexterity, toilet t/f's/bathroom ADLs/functional mobility within bathroom with wc vs walker for safe return to school.   PERFORMANCE DEFICITS: in functional skills including ADLs, coordination, strength, pain, and balance, cognitive skills including attention and problem solving, and psychosocial skills including coping strategies and environmental  adaptation.   IMPAIRMENTS: are limiting patient from ADLs and leisure.   COMORBIDITIES: has co-morbidities such as Prader-Willi syndrome, intellectual delay  that affect occupational performance. Patient will benefit from skilled OT to address above impairments and improve overall function.  MODIFICATION OR ASSISTANCE TO COMPLETE EVALUATION: Min-Moderate modification of tasks or assist with assess necessary to complete an evaluation.  OT OCCUPATIONAL PROFILE AND HISTORY: Detailed assessment: Review of records and additional review of physical, cognitive, psychosocial history related to current functional performance.  CLINICAL DECISION MAKING: Moderate - several treatment options, min-mod task modification necessary  REHAB POTENTIAL: Good  EVALUATION COMPLEXITY: Moderate      PLAN:  OT FREQUENCY: 1-2x/week  OT DURATION: 12 weeks  PLANNED INTERVENTIONS: 97535 self care/ADL training, 95621 therapeutic exercise, 97530 therapeutic activity, patient/family education, and DME and/or AE instructions  RECOMMENDED OTHER SERVICES: PT  CONSULTED AND AGREED WITH PLAN OF CARE: Patient and family member/caregiver  PLAN FOR NEXT SESSION: ADLs  Duey Ghent, MS, OTR/L   11/04/2023, 11:41 AM

## 2023-11-10 ENCOUNTER — Ambulatory Visit: Payer: MEDICAID

## 2023-11-10 ENCOUNTER — Ambulatory Visit: Payer: MEDICAID | Admitting: Occupational Therapy

## 2023-11-10 DIAGNOSIS — G8929 Other chronic pain: Secondary | ICD-10-CM

## 2023-11-10 DIAGNOSIS — R278 Other lack of coordination: Secondary | ICD-10-CM

## 2023-11-10 DIAGNOSIS — M6281 Muscle weakness (generalized): Secondary | ICD-10-CM

## 2023-11-10 DIAGNOSIS — R262 Difficulty in walking, not elsewhere classified: Secondary | ICD-10-CM

## 2023-11-10 DIAGNOSIS — S83004A Unspecified dislocation of right patella, initial encounter: Secondary | ICD-10-CM

## 2023-11-10 DIAGNOSIS — R2681 Unsteadiness on feet: Secondary | ICD-10-CM

## 2023-11-10 NOTE — Therapy (Signed)
 OUTPATIENT PHYSICAL THERAPY LE TREATMENT   Patient Name: Linda Mason MRN: [id number] DOB:1996-07-06, 28 y.o., female Today's Date: 11/10/2023  END OF SESSION:  PT End of Session - 11/10/23 1313     Visit Number 7    Number of Visits 25    Date for PT Re-Evaluation 01/10/24    Progress Note Due on Visit 10    PT Start Time 1317    PT Stop Time 1359    PT Time Calculation (min) 42 min    Equipment Utilized During Treatment Gait belt    Activity Tolerance Patient tolerated treatment well;Other (comment)   pt throughout reporting fear of movement   Behavior During Therapy WFL for tasks assessed/performed               Past Medical History:  Diagnosis Date   Diabetes mellitus without complication [facility])    Past Surgical History:  Procedure Laterality Date   KNEE ARTHROSCOPY Right 08/27/2023   Procedure: ARTHROSCOPY KNEE;  Surgeon: [name], MD;  Location: [facility] ORS;  Service: Orthopedics;  Laterality: Right;   Patient Active Problem List   Diagnosis Date Noted   Closed patellar dislocation, right, initial encounter 09/01/2023   Patellar dislocation 08/26/2023   Type 2 diabetes mellitus without complication, with long-term current use of insulin  (HCC) 08/26/2023   Hypoxia 08/26/2023   Morbid obesity (HCC) 10/09/2021   Diabetes mellitus with neuropathy (HCC) 10/09/2021   Essential hypertension 10/09/2021   Thrombocytopenia (HCC) 10/09/2021   Hypothyroidism 10/09/2021   Prader-Willi syndrome 10/09/2021   Cellulitis 10/08/2021     [facility]P: [name]  REFERRING PROVIDER: [name], PA-C   REFERRING DIAG:  Diagnosis  S83.004A (ICD-10-CM) - Closed patellar dislocation, right, initial encounter    Rationale for Evaluation and Treatment: Rehabilitation  THERAPY DIAG:  Muscle weakness (generalized)  Chronic pain of right knee  Difficulty in walking, not elsewhere classified  Unsteadiness on feet  Closed patellar dislocation,  right, initial encounter  Other lack of coordination  ONSET DATE: 08/26/2023   SUBJECTIVE:                                                                                                                                                                                           SUBJECTIVE STATEMENT  Patient  reports feeling good today- denies any pain or falls. States still wanting to go to prom in couple of weeks.    The pt is a pleasant 28 yo female. She presents in [facility] with her mom and an interpreter present for PT eval. Pt suffered a R knee dislocation following a fall 08/26/2023. Pt  also found to have lateral femoral condyle impacting fx and medial patella impaction fracture of RLE. She underwent arthroscopic assisted R patellar reduction with lateral release on 08/27/2023. Pt admitted to CIR from 09/02/23 - 09/15/23/ Pt just had follow-up with surgeon and has been instructed to wean from hinged knee brace/has been released from care; she is WBAT. Pt's mother reports pt using 2WW to ambulate for short distances: maybe ambulating 30-50 steps at most without brace. Pt reports ambulation is limited by R knee pain. Her knee hurts most when walking. She has difficulty completing STS but reports she can do this on her own, although not able to do this yet in her school's bathroom and would like to work on this. Pt was independent with ADLs prior to injury. Her mother is helping her now with dressing. She has not yet used steps to enter her home, is currently using her WC on ramp. They report no other falls. Pt wants to improve ability to ambulate for her school prom. She likes to watch movies.  Pt has Prader-Willi syndrome, intellectual delay.   PERTINENT HISTORY:   PMH also significant for DM II with neuropathy, HTN, thrombocytopenia, hypothyroidism, Prader-Willi syndrome.    PAIN:  Are you having pain?  None currently, takes medicine for her knee pain; no othe rpain  PRECAUTIONS:  Knee -  WBAT  RED FLAGS: None   WEIGHT BEARING RESTRICTIONS:  Via surgeon note "1. I recommended the patient be weightbearing as tolerated and wean from the hinged knee brace."  FALLS:  Has patient fallen in last 6 months? No  LIVING ENVIRONMENT: Lives with: lives with their family in one level home Stairs:  2 steps, handrails bilat  to enter home, but also has two ramps that she uses to enter home and is not currently using steps Has following equipment at home: RW  OCCUPATION:  Student   PLOF:  Independent  PATIENT GOALS:  Pt wants to walk to be able to go to school prom; she wants to be able to dance.  NEXT MD VISIT: Had follow-up today,  was released from care but can follow-up as needed.    OBJECTIVE:  Note: Objective measures were completed at Evaluation unless otherwise noted.  DIAGNOSTIC FINDINGS:  Via chart, note [name], MD "R Knee radiographs:  10/18/2023: 2 views (wheelchair bound lateral and oblique radiographs with suboptimal positioning) were obtained. Appropriate images cannot be obtained due to patient's hesitation/unwillingness to transfer to x-ray table. Within these limitations, there does not appear to be a recurrent patellar dislocation. There is significant osteopenia. Impaction fracture about the lateral femoral condyle is also visualized. It does not appear to fully involve the articular weightbearing surface. "  PATIENT SURVEYS:  LEFS deferred  COGNITIVE STATUS: Impaired at baseline   SENSATION: WFL BLE  COORDINATION: WFL rapid alt LE, unable to do heel>shin likely limited by body habitus and some hesitance/faer of movement  EDEMA:  Pt R knee still with slight edema around patella, no redness present, scars healing well  POSTURE:  rounded shoulders, increased fwd posture/kyphosis possibly impacted by body habitus    GAIT: Distance walked: (see below) Assistive device utilized: Environmental consultant - 2 wheeled Level of assistance:  CGA Comments: Decreased gait speed, antalgic gait, decreased weightbearing and stance time on RLE, heavy BUE weightbearing through RW    Body Part #1 Knee  PALPATION: No pain with palpation superior, inferior, LTL patella, posterior knee RLE  LOWER EXTREMITY ROM:    Need  to complete formal assessment, however pt exhibited to have impaired R hip flexion mobility with MMT, possibly due positioning but needs further assessment.    LOWER EXTREMITY MMT:    MMT Right eval Left eval  Hip flexion 3- 3+  Hip extension    Hip abduction 4 4  Hip adduction 3* 4  Hip internal rotation    Hip external rotation    Knee flexion * 4  Knee extension 3* 4+  Ankle dorsiflexion 4 4  Ankle plantarflexion    Ankle inversion    Ankle eversion     (Blank rows = not tested) "*" indicates pain-limited. Pt also might be self-limiting due to fear of pain with movement     FUNCTIONAL TESTS:  5 times sit to stand: 42.5 sec heavy use of UE to RW  6 minute walk test: deferred tp future visit 10 meter walk test: 0.16 sec with BRW  Berg Balance Scale: deferred to future visit                                                                                                                        TREATMENT DATE:   *Spanish interpreter present for patient's mother during visit today.  Gait training:  Gait with RW 20ft x 2 focusing on decreased WB through R LE- Max VC and visual demonstration showing her how to walk reciprocally and decrease weight through BUE- Poor to fair ability to return demonstration.  Gait with RW to OT gym for OT session ( 100 ft), improved speed, still poor RLE weight shift    TA   Throughout session, sit<>stand with CGA with min cues for use of arm rest with transition to sitting and standing for improved sequencing of movement and reduced risk of posterior LOB.   Foot tap to 6 in step with UE support x 15 ea LE, HEAVY UE assist when standing on R LE despite cues to do  otherwise   Sit to stand with mirror (as she initally shifted over to left side to stand) improved with feedback from mirror) x 20 reps (no pain reported)   At steps- Mini lunges (No pain reported) x 12 reps  Heel raises 2 x 10 reps    TE: Seated LAQ with 3#  alt LE 2 x 10  Seated Hip flex 3#  2 x 10 bil Seated knee extended hip AB/ ADD 2 x 10, better ability to follow external cue of lifting LE over bolster.  Seated ham curl BTB 2 x 10 reps         Unless otherwise stated, SBA was provided and gait belt donned in order to ensure pt safety   PATIENT EDUCATION:  Education details: assessment findings, goals, HEP, plan, safe technique with STS to walker Person educated: Patient and Parent Education method: Explanation, Demonstration, Tactile cues, Verbal cues, and Handouts Education comprehension: verbalized understanding, returned demonstration, verbal cues required, tactile cues required, and needs further education  HOME EXERCISE PROGRAM:  Reviewed: safe technique, signs and symptoms to watch for that proceeding is OK, stop if feeling increased pain/any sharp pain in knee Access Code: [email or web address] URL: [email or web address] Date: 10/18/2023 Prepared by: Aminta Kales  Exercises - Sit to Stand with Counter Support  - 1 x daily - 7 x weekly - 2 sets - 5 reps    ASSESSMENT:  CLINICAL IMPRESSION:  Treatment  continued on interventions focused on activities that promote increased RLE weight bearing. She was able to progress with some improved overall RLE weight bearing- able to sit to stand with more equal weight bearing with using mirror for feedback. She was able to slightly improve her gait with max cues for less dependence and WB through BUE. Treatment will continue to focus on improving her confidence in her ability to add more weight through RLE. Pt will continue to benefit from skilled therapy to address remaining deficits in order to improve overall QoL and  return to PLOF.       OBJECTIVE IMPAIRMENTS: Abnormal gait, decreased activity tolerance, decreased balance, decreased coordination, decreased endurance, decreased mobility, difficulty walking, decreased ROM, decreased strength, increased edema, impaired flexibility, improper body mechanics, postural dysfunction, obesity, and pain.   ACTIVITY LIMITATIONS: carrying, lifting, bending, standing, squatting, stairs, transfers, toileting, dressing, and locomotion level  PARTICIPATION LIMITATIONS: meal prep, cleaning, laundry, shopping, community activity, yard work, and school  PERSONAL FACTORS: Fitness, Sex, and 3+ comorbidities: PMH significant for DM II with neuropathy, HTN, thrombocytopenia, hypothyroidism, Prader-Willi syndrome,   are also affecting patient's functional outcome.   REHAB POTENTIAL: Good  CLINICAL DECISION MAKING: Evolving/moderate complexity  EVALUATION COMPLEXITY: Moderate   GOALS: Goals reviewed with patient? Yes  SHORT TERM GOALS: Target date: 11/29/2023  Patient will be independent in home exercise program to improve strength/mobility for better functional independence with ADLs. Baseline: initiated Goal status: INITIAL    LONG TERM GOALS: Target date: 01/10/2024  Patient will increase lower extremity functional scale to >60/80 to demonstrate improved functional mobility and increased tolerance with ADLs. .  Baseline: deferred to future session Goal status: INITIAL  2.  Patient (> 54 years old) will complete five times sit to stand test in < 15 seconds indicating an increased LE strength and improved balance. Baseline: 42.5 sec, heavy use of BUE Goal status: INITIAL  3.  Patient will increase Berg Balance score by > 6 points to demonstrate decreased fall risk during functional activities Baseline: deferred to future session, may need to complete different balance test if pt has difficulty with cuing for test activities  Goal status: INITIAL  4.  Patient  will increase 10 meter walk test to >1.17m/s as to improve gait speed for better community ambulation and to reduce fall risk. Baseline: 0.16 with BRW Goal status: INITIAL  5.  Patient will increase six minute walk test distance to >1000 for progression to community ambulator and improve gait ability. Baseline:  280 ft with RW Goal status: INITIAL    PLAN:  PT FREQUENCY: 1-2x/week  PT DURATION: 12 weeks  PLANNED INTERVENTIONS: 97164- PT Re-evaluation, 97750- Physical Performance Testing, 97110-Therapeutic exercises, 97530- Therapeutic activity, [id number]- Neuromuscular re-education, 97535- Self Care, [id number]- Manual therapy, [phone]- Gait training, [phone]- Orthotic Initial, [phone]- Orthotic/Prosthetic subsequent, [phone]- Canalith repositioning, [id number]- Splinting, [id number]- Electrical stimulation (unattended), [phone]- Electrical stimulation (manual), [id number]- Ultrasound, Patient/Family education, Balance training, Stair training, Taping, Joint mobilization, Joint manipulation, Spinal mobilization, Scar mobilization, Vestibular training, DME instructions, Wheelchair mobility training, Cryotherapy, and Moist heat.  PLAN FOR NEXT SESSION:  - Continue with  RLE muscle strengthening -Continue with more weight bearing activities for RLE with decreased UE support- Consider using blaze podsContinue with overall static and dynamic balance - add to HEP, and complete BERG as indicated/time permits - Test gait speed with RW to challenge patient to decrease UE support   Murlene Army PT  Physical Therapist- [facility]   3:14 PM 11/10/23

## 2023-11-10 NOTE — Therapy (Addendum)
 OUTPATIENT OCCUPATIONAL THERAPY ORTHO TREATMENT  Patient Name: Linda Mason MRN: [id number] DOB:02/14/1996, 28 y.o., female Today's Date: 11/10/2023  [name] REFERRING PROVIDER: Georjean Kite  END OF SESSION:  OT End of Session - 11/10/23 1604     Visit Number 8    Number of Visits 24    Date for OT Re-Evaluation 01/06/24    OT Start Time 1400    OT Stop Time 1445    OT Time Calculation (min) 45 min    Activity Tolerance Patient tolerated treatment well    Behavior During Therapy [facility] for tasks assessed/performed            Past Medical History:  Diagnosis Date   Diabetes mellitus without complication (HCC)    Past Surgical History:  Procedure Laterality Date   KNEE ARTHROSCOPY Right 08/27/2023   Procedure: ARTHROSCOPY KNEE;  Surgeon: [name], MD;  Location: [facility] ORS;  Service: Orthopedics;  Laterality: Right;   Patient Active Problem List   Diagnosis Date Noted   Closed patellar dislocation, right, initial encounter 09/01/2023   Patellar dislocation 08/26/2023   Type 2 diabetes mellitus without complication, with long-term current use of insulin  (HCC) 08/26/2023   Hypoxia 08/26/2023   Morbid obesity (HCC) 10/09/2021   Diabetes mellitus with neuropathy (HCC) 10/09/2021   Essential hypertension 10/09/2021   Thrombocytopenia (HCC) 10/09/2021   Hypothyroidism 10/09/2021   Prader-Willi syndrome 10/09/2021   Cellulitis 10/08/2021   ONSET DATE: 08/26/2023  REFERRING DIAG: Right knee patella fracture  THERAPY DIAG:  Muscle weakness (generalized)  Rationale for Evaluation and Treatment: Rehabilitation  SUBJECTIVE:   SUBJECTIVE STATEMENT:  Pt. reports being excited for prom next week.  Pt accompanied by: family member and interpreter: Camila - Spanish interpreter  PERTINENT HISTORY: TORINA THRUSH is a 28 year old right-handed morbidly obese female and BMI 44.88 as well as diabetes mellitus, Prader-Willi  syndrome, intellectual delay. Presented to [facility] ED on 08/26/2023 after mechanical fall getting out of her bed. X-rays and imaging revealed right knee irreducible patellar dislocation right knee lateral femoral condyle impacting fracture as well as right knee medial patella impaction fracture. Pt underwent right knee arthroscopic assisted patellar reduction with lateral release on 08/27/2023 per Dr. [name].  Weightbearing as tolerated right lower extremity knee immobilizer when out of bed or when walking. Pt admitted to CIR from 09/02/23 - 09/15/23  PRECAUTIONS: Knee , Knee Immobilizer    WEIGHT BEARING RESTRICTIONS: Yes WBAT  PAIN:  Are you having pain? Yes: NPRS scale: 0/10 Pain location: knee  FALLS: Has patient fallen in last 6 months? No  LIVING ENVIRONMENT: Lives with: lives with their family Lives in: House/apartment Stairs: No ramp Has following equipment at home: Wheelchair (manual)  PLOF: Independent with basic ADLs  PATIENT GOALS: to return to school and go to [employer] with no fear of falling   NEXT MD VISIT: none  OBJECTIVE:  Note: Objective measures were completed at Evaluation unless otherwise noted.  HAND DOMINANCE: Left  ADLs: Transfers/ambulation related to ADLs: SUPERVISION short distances using RW, MOD I using w/c Eating: IND Grooming: IND Upper body dressing: SETUP assist Lower body dressing: MAX A Toileting: SUPERVISION Bathing: MOD A bathing   Tub shower transfers: SUPERVISION Equipment: Shower seat with back, Walk in shower, and bed side commode  UPPER EXTREMITY ROM:     Active ROM Right  WFL Left  WFL    UPPER EXTREMITY MMT:   Question accuracy given difficulty following commands   MMT Right  eval Left eval  Shoulder flexion 3+/5 3+/5  Shoulder abduction    Elbow flexion 3+/5 3+/5  Elbow extension 3+/5 3+/5  Wrist flexion    Wrist extension    (Blank rows = not tested)  HAND FUNCTION: Grip strength: Right: 10 lbs; Left: 15 lbs,  Lateral pinch: Right: 4 lbs, Left: 5 lbs, and 3 point pinch: Right: 1 lbs, Left: 1 lbs  COORDINATION: 9 Hole Peg test: Right: 40 sec; Left: 30 sec  SENSATION: Light touch: Impaired  tingling  EDEMA: none  COGNITION: Overall cognitive status: History of cognitive impairments - at baseline Areas of impairment: Areas of impairment: Problem solving  Commands: Follows one step commands with increased time Behavior: Perseveration and anxious  OBSERVATIONS: Pt appears anxious and looking toward her mother for answers to questions.    TREATMENT DATE: 11/10/23  Therapeutic Ex.:   -Performed BUE strengthening using a 2.5# dowel ex. 2/2 to weakness. Bilateral shoulder flexion, chest press, circular patterns, and elbow flexion/extension for 1 set  10 reps each.  -Performed BUE strengthening using 2# hand weight for elbow flexion, and extension, forearm supination/pronation, wrist flexion/extension, and radial deviation.   -BUE strengthening using green theraband  for bilateral shoulder flexion, horizontal abduction, diagonal shoulder abduction, elbow flexion, and extension.  -Progressive gross grip strengthening with 11.2# of grip strength resistive force, and modified to 6.6# after the first 3.  PATIENT EDUCATION: Education details: Bathroom mobility progressions with less use of wc and increased use of walker Person educated: Counselling psychologist, pt Education method: Medical illustrator Education comprehension: verbalized understanding, mother receptive  HOME EXERCISE PROGRAM:  Theraputty  GOALS: Goals reviewed with patient? Yes    SHORT TERM GOALS: Target date: 11/24/23  Pt will INDEPENDENTLY complete HEP  Baseline: 10/14/23: no HEP issued Goal status: INITIAL  LONG TERM GOALS: Target date: 01/06/24  Pt will increase B strength by 15# to assist with gripping grab bar for safe transfers   Baseline: 10/14/23: L 15#, R 10#  Goal status: INITIAL  2.  Pt will INDEPENDENTLY transfer to  regular height toilet  Baseline: SUPERVISION + RW for elevated toilet, MIN A + RW for regular toilet Goal status: INITIAL  3.  Pt will improve B FMC by decreasing 9 hole PEG test score by 10 seconds for participation in leisure activities (coloring).  Baseline: L 30", R 40" Goal status: INITIAL  4.  Pt will verbalize x3 strategies to manage anxiousness and fear of falling to reduce fall risk. Baseline: 0/3 strategies.  Goal status: INITIAL  5.  Pt will increase B lateral pinch strength by 5# to manage ADL items.  Baseline: R 4#, L 5# Goal status: INITIAL   ASSESSMENT:  CLINICAL IMPRESSION:  Pt. walked from the front hospital entrance to therapy this afternoon. Pt./caregiver report that she was able to walk without difficulty using the walker. Pt. tolerated the exercises well. Pt.UE strengthening HEP was upgraded from red to green theraband. Pt. required cues for the grip strengthening, and required the resistance to be modified from 11.2# of force to 6.6# of force. Pt. continues to benefit from OT services to improve bilateral strength/dexterity, toilet t/f's/bathroom ADLs/functional mobility within bathroom with wc vs walker for safe return to school.   PERFORMANCE DEFICITS: in functional skills including ADLs, coordination, strength, pain, and balance, cognitive skills including attention and problem solving, and psychosocial skills including coping strategies and environmental adaptation.   IMPAIRMENTS: are limiting patient from ADLs and leisure.   COMORBIDITIES: has co-morbidities such as Prader-Willi  syndrome, intellectual delay  that affect occupational performance. Patient will benefit from skilled OT to address above impairments and improve overall function.  MODIFICATION OR ASSISTANCE TO COMPLETE EVALUATION: Min-Moderate modification of tasks or assist with assess necessary to complete an evaluation.  OT OCCUPATIONAL PROFILE AND HISTORY: Detailed assessment: Review of records  and additional review of physical, cognitive, psychosocial history related to current functional performance.  CLINICAL DECISION MAKING: Moderate - several treatment options, min-mod task modification necessary  REHAB POTENTIAL: Good  EVALUATION COMPLEXITY: Moderate      PLAN:  OT FREQUENCY: 1-2x/week  OT DURATION: 12 weeks  PLANNED INTERVENTIONS: 97535 self care/ADL training, 16109 therapeutic exercise, 97530 therapeutic activity, patient/family education, and DME and/or AE instructions  RECOMMENDED OTHER SERVICES: PT  CONSULTED AND AGREED WITH PLAN OF CARE: Patient and family member/caregiver  PLAN FOR NEXT SESSION: ADLs  Duey Ghent, MS, OTR/L   11/10/2023, 4:05 PM

## 2023-11-11 NOTE — Therapy (Signed)
 OUTPATIENT OCCUPATIONAL THERAPY ORTHO TREATMENT  Patient Name: Linda Mason MRN: [id number] DOB:[birth year], 28 y.o., female Today's Date: 11/12/2023  PCP: [name] REFERRING PROVIDER: Georjean Kite  END OF SESSION:  OT End of Session - 11/12/23 0843     Visit Number 9    Number of Visits 24    Date for OT Re-Evaluation 01/06/24    OT Start Time 0845    OT Stop Time 0930    OT Time Calculation (min) 45 min    Activity Tolerance Patient tolerated treatment well    Behavior During Therapy [facility] for tasks assessed/performed             Past Medical History:  Diagnosis Date   Diabetes mellitus without complication [facility])    Past Surgical History:  Procedure Laterality Date   KNEE ARTHROSCOPY Right 08/27/2023   Procedure: ARTHROSCOPY KNEE;  Surgeon: [name], MD;  Location: [facility] ORS;  Service: Orthopedics;  Laterality: Right;   Patient Active Problem List   Diagnosis Date Noted   Closed patellar dislocation, right, initial encounter 09/01/2023   Patellar dislocation 08/26/2023   Type 2 diabetes mellitus without complication, with long-term current use of insulin  (HCC) 08/26/2023   Hypoxia 08/26/2023   Morbid obesity (HCC) 10/09/2021   Diabetes mellitus with neuropathy (HCC) 10/09/2021   Essential hypertension 10/09/2021   Thrombocytopenia (HCC) 10/09/2021   Hypothyroidism 10/09/2021   Prader-Willi syndrome 10/09/2021   Cellulitis 10/08/2021   ONSET DATE: 08/26/2023  REFERRING DIAG: Right knee patella fracture  THERAPY DIAG:  Muscle weakness (generalized)  Other lack of coordination  Rationale for Evaluation and Treatment: Rehabilitation  SUBJECTIVE:   SUBJECTIVE STATEMENT:  Pt. reports being excited for a graduation party this weekend.  Pt accompanied by: family member and interpreter: Camila - Spanish interpreter  PERTINENT HISTORY: Linda Mason is a 28 year old right-handed morbidly obese female and BMI 44.88 as  well as diabetes mellitus, Prader-Willi syndrome, intellectual delay. Presented to [facility] ED on 08/26/2023 after mechanical fall getting out of her bed. X-rays and imaging revealed right knee irreducible patellar dislocation right knee lateral femoral condyle impacting fracture as well as right knee medial patella impaction fracture. Pt underwent right knee arthroscopic assisted patellar reduction with lateral release on 08/27/2023 per Dr. [name].  Weightbearing as tolerated right lower extremity knee immobilizer when out of bed or when walking. Pt admitted to CIR from 09/02/23 - 09/15/23  PRECAUTIONS: Knee , Knee Immobilizer    WEIGHT BEARING RESTRICTIONS: Yes WBAT  PAIN:  Are you having pain? Yes: NPRS scale: 0/10 Pain location: knee  FALLS: Has patient fallen in last 6 months? No  LIVING ENVIRONMENT: Lives with: lives with their family Lives in: House/apartment Stairs: No ramp Has following equipment at home: Wheelchair (manual)  PLOF: Independent with basic ADLs  PATIENT GOALS: to return to school and go to [employer] with no fear of falling   NEXT MD VISIT: none  OBJECTIVE:  Note: Objective measures were completed at Evaluation unless otherwise noted.  HAND DOMINANCE: Left  ADLs: Transfers/ambulation related to ADLs: SUPERVISION short distances using RW, MOD I using w/c Eating: IND Grooming: IND Upper body dressing: SETUP assist Lower body dressing: MAX A Toileting: SUPERVISION Bathing: MOD A bathing   Tub shower transfers: SUPERVISION Equipment: Shower seat with back, Walk in shower, and bed side commode  UPPER EXTREMITY ROM:     Active ROM Right  WFL Left  WFL    UPPER EXTREMITY MMT:   Question accuracy  given difficulty following commands   MMT Right eval Left eval  Shoulder flexion 3+/5 3+/5  Shoulder abduction    Elbow flexion 3+/5 3+/5  Elbow extension 3+/5 3+/5  Wrist flexion    Wrist extension    (Blank rows = not tested)  HAND FUNCTION: Grip  strength: Right: 10 lbs; Left: 15 lbs, Lateral pinch: Right: 4 lbs, Left: 5 lbs, and 3 point pinch: Right: 1 lbs, Left: 1 lbs  COORDINATION: 9 Hole Peg test: Right: 40 sec; Left: 30 sec  SENSATION: Light touch: Impaired  tingling  EDEMA: none  COGNITION: Overall cognitive status: History of cognitive impairments - at baseline Areas of impairment: Areas of impairment: Problem solving  Commands: Follows one step commands with increased time Behavior: Perseveration and anxious  OBSERVATIONS: Pt appears anxious and looking toward her mother for answers to questions.    TREATMENT DATE: 11/12/23  Therapeutic Ex.:   -Performed BUE strengthening using a 4# dowel ex. 2/2 to weakness. Bilateral shoulder flexion, chest press, circular patterns, and elbow flexion/extension for 1 set  20 reps each.    -Sustained gross grip strengthening with 6.6# of grip strength resistive force to place and remove pegs from peg board using L and R hand.  -Lateral, and 3pt. Pinch strengthening using yellow, red, green, and blue, and black level resistive clips using L and R hand on horizontal and vertical dowel.  - Pt worked on [employer] using the UBE from a standing position. Tolerated 2 in forward and 2 min backward with min resistance.   Self Care:  -SUPERVISION standing hand washing task, SUPERVISION + RW for toilet t/f. MIN cues for fall prevention strategies.     PATIENT EDUCATION: Education details: Bathroom mobility progressions with less use of wc and increased use of walker Person educated: Counselling psychologist, pt Education method: Medical illustrator Education comprehension: verbalized understanding, mother receptive  HOME EXERCISE PROGRAM:  Theraputty  GOALS: Goals reviewed with patient? Yes    SHORT TERM GOALS: Target date: 11/24/23  Pt will INDEPENDENTLY complete HEP  Baseline: 10/14/23: no HEP issued Goal status: INITIAL  LONG TERM GOALS: Target date: 01/06/24  Pt will increase B  strength by 15# to assist with gripping grab bar for safe transfers   Baseline: 10/14/23: L 15#, R 10#  Goal status: INITIAL  2.  Pt will INDEPENDENTLY transfer to regular height toilet  Baseline: SUPERVISION + RW for elevated toilet, MIN A + RW for regular toilet Goal status: INITIAL  3.  Pt will improve B FMC by decreasing 9 hole PEG test score by 10 seconds for participation in leisure activities (coloring).  Baseline: L 30", R 40" Goal status: INITIAL  4.  Pt will verbalize x3 strategies to manage anxiousness and fear of falling to reduce fall risk. Baseline: 0/3 strategies.  Goal status: INITIAL  5.  Pt will increase B lateral pinch strength by 5# to manage ADL items.  Baseline: R 4#, L 5# Goal status: INITIAL   ASSESSMENT:  CLINICAL IMPRESSION:  Pt. walked from the front hospital entrance to therapy this morning. MIN cues for safety for toilet t/f and standing grooming. Tolerated L and R hand grip strengthening using 6.6# to place and remove pegs. Improved resistance of dowel to 4# for BUE strengthening. Pt. continues to benefit from OT services to improve bilateral strength/dexterity, toilet t/f's/bathroom ADLs/functional mobility within bathroom with wc vs walker for safe return to school.   PERFORMANCE DEFICITS: in functional skills including ADLs, coordination, strength, pain, and balance,  cognitive skills including attention and problem solving, and psychosocial skills including coping strategies and environmental adaptation.   IMPAIRMENTS: are limiting patient from ADLs and leisure.   COMORBIDITIES: has co-morbidities such as Prader-Willi syndrome, intellectual delay that affect occupational performance. Patient will benefit from skilled OT to address above impairments and improve overall function.  MODIFICATION OR ASSISTANCE TO COMPLETE EVALUATION: Min-Moderate modification of tasks or assist with assess necessary to complete an evaluation.  OT OCCUPATIONAL PROFILE AND  HISTORY: Detailed assessment: Review of records and additional review of physical, cognitive, psychosocial history related to current functional performance.  CLINICAL DECISION MAKING: Moderate - several treatment options, min-mod task modification necessary  REHAB POTENTIAL: Good  EVALUATION COMPLEXITY: Moderate      PLAN:  OT FREQUENCY: 1-2x/week  OT DURATION: 12 weeks  PLANNED INTERVENTIONS: 97535 self care/ADL training, 16109 therapeutic exercise, 97530 therapeutic activity, patient/family education, and DME and/or AE instructions  RECOMMENDED OTHER SERVICES: PT  CONSULTED AND AGREED WITH PLAN OF CARE: Patient and family member/caregiver  PLAN FOR NEXT SESSION: ADLs  Gordan Latina, M.S. OTR/L  11/12/23, 8:45 AM  ascom [phone]   11/12/2023, 8:45 AM

## 2023-11-12 ENCOUNTER — Ambulatory Visit: Payer: MEDICAID | Admitting: Physical Therapy

## 2023-11-12 ENCOUNTER — Ambulatory Visit: Payer: MEDICAID

## 2023-11-12 DIAGNOSIS — R2681 Unsteadiness on feet: Secondary | ICD-10-CM

## 2023-11-12 DIAGNOSIS — M6281 Muscle weakness (generalized): Secondary | ICD-10-CM | POA: Diagnosis not present

## 2023-11-12 DIAGNOSIS — R262 Difficulty in walking, not elsewhere classified: Secondary | ICD-10-CM

## 2023-11-12 DIAGNOSIS — G8929 Other chronic pain: Secondary | ICD-10-CM

## 2023-11-12 DIAGNOSIS — R278 Other lack of coordination: Secondary | ICD-10-CM

## 2023-11-12 NOTE — Therapy (Signed)
 OUTPATIENT PHYSICAL THERAPY LE TREATMENT   Patient Name: Linda Mason MRN: [id number] DOB:September 18, 1995, 28 y.o., female Today's Date: 11/12/2023  END OF SESSION:  PT End of Session - 11/12/23 0946     Visit Number 8    Number of Visits 25    Date for PT Re-Evaluation 01/10/24    Progress Note Due on Visit 10    PT Start Time 0824    PT Stop Time 0845    PT Time Calculation (min) 21 min    Equipment Utilized During Treatment Gait belt    Activity Tolerance Patient tolerated treatment well;Other (comment)   pt throughout reporting fear of movement   Behavior During Therapy WFL for tasks assessed/performed                Past Medical History:  Diagnosis Date   Diabetes mellitus without complication [facility])    Past Surgical History:  Procedure Laterality Date   KNEE ARTHROSCOPY Right 08/27/2023   Procedure: ARTHROSCOPY KNEE;  Surgeon: Linda Rota, MD;  Location: [facility] ORS;  Service: Orthopedics;  Laterality: Right;   Patient Active Problem List   Diagnosis Date Noted   Closed patellar dislocation, right, initial encounter 09/01/2023   Patellar dislocation 08/26/2023   Type 2 diabetes mellitus without complication, with long-term current use of insulin  (HCC) 08/26/2023   Hypoxia 08/26/2023   Morbid obesity (HCC) 10/09/2021   Diabetes mellitus with neuropathy (HCC) 10/09/2021   Essential hypertension 10/09/2021   Thrombocytopenia (HCC) 10/09/2021   Hypothyroidism 10/09/2021   Prader-Willi syndrome 10/09/2021   Cellulitis 10/08/2021     [facility]: [facility]  REFERRING PROVIDER: [name], PA-C   REFERRING DIAG:  Diagnosis  S83.004A (ICD-10-CM) - Closed patellar dislocation, right, initial encounter    Rationale for Evaluation and Treatment: Rehabilitation  THERAPY DIAG:  Chronic pain of right knee  Difficulty in walking, not elsewhere classified  Unsteadiness on feet  Muscle weakness (generalized)  ONSET DATE:  08/26/2023   SUBJECTIVE:                                                                                                                                                                                           SUBJECTIVE STATEMENT  Pt session was cut a little short today due to pt arriving late to scheduled appointment time. No changes of note.    From Eval: The pt is a pleasant 28 yo female. She presents in [facility] with her mom and an interpreter present for PT eval. Pt suffered a R knee dislocation following a fall 08/26/2023. Pt also found to have lateral femoral condyle impacting fx and  medial patella impaction fracture of RLE. She underwent arthroscopic assisted R patellar reduction with lateral release on 08/27/2023. Pt admitted to CIR from 09/02/23 - 09/15/23/ Pt just had follow-up with surgeon and has been instructed to wean from hinged knee brace/has been released from care; she is WBAT. Pt's mother reports pt using 2WW to ambulate for short distances: maybe ambulating 30-50 steps at most without brace. Pt reports ambulation is limited by R knee pain. Her knee hurts most when walking. She has difficulty completing STS but reports she can do this on her own, although not able to do this yet in her school's bathroom and would like to work on this. Pt was independent with ADLs prior to injury. Her mother is helping her now with dressing. She has not yet used steps to enter her home, is currently using her WC on ramp. They report no other falls. Pt wants to improve ability to ambulate for her school prom. She likes to watch movies.  Pt has Prader-Willi syndrome, intellectual delay.   PERTINENT HISTORY:   PMH also significant for DM II with neuropathy, HTN, thrombocytopenia, hypothyroidism, Prader-Willi syndrome.    PAIN:  Are you having pain? None currently, takes medicine for her knee pain; no othe rpain  PRECAUTIONS:  Knee - WBAT  RED FLAGS: None   WEIGHT BEARING RESTRICTIONS:  Via surgeon  note "1. I recommended the patient be weightbearing as tolerated and wean from the hinged knee brace."  FALLS:  Has patient fallen in last 6 months? No  LIVING ENVIRONMENT: Lives with: lives with their family in one level home Stairs: 2 steps, handrails bilat to enter home, but also has two ramps that she uses to enter home and is not currently using steps Has following equipment at home: RW  OCCUPATION:  Student   PLOF:  Independent  PATIENT GOALS:  Pt wants to walk to be able to go to school prom; she wants to be able to dance.  NEXT MD VISIT: Had follow-up today,  was released from care but can follow-up as needed.    OBJECTIVE:  Note: Objective measures were completed at Evaluation unless otherwise noted.  DIAGNOSTIC FINDINGS:  Via chart, note Linda Chambers, MD "R Knee radiographs:  10/18/2023: 2 views (wheelchair bound lateral and oblique radiographs with suboptimal positioning) were obtained. Appropriate images cannot be obtained due to patient's hesitation/unwillingness to transfer to x-ray table. Within these limitations, there does not appear to be a recurrent patellar dislocation. There is significant osteopenia. Impaction fracture about the lateral femoral condyle is also visualized. It does not appear to fully involve the articular weightbearing surface. "  PATIENT SURVEYS:  LEFS deferred  COGNITIVE STATUS: Impaired at baseline   SENSATION: WFL BLE  COORDINATION: WFL rapid alt LE, unable to do heel>shin likely limited by body habitus and some hesitance/faer of movement  EDEMA:  Pt R knee still with slight edema around patella, no redness present, scars healing well  POSTURE:  rounded shoulders, increased fwd posture/kyphosis possibly impacted by body habitus    GAIT: Distance walked: (see below) Assistive device utilized: Environmental consultant - 2 wheeled Level of assistance: CGA Comments: Decreased gait speed, antalgic gait, decreased weightbearing and  stance time on RLE, heavy BUE weightbearing through RW    Body Part #1 Knee  PALPATION: No pain with palpation superior, inferior, LTL patella, posterior knee RLE  LOWER EXTREMITY ROM:    Need to complete formal assessment, however pt exhibited to have impaired R hip flexion  mobility with MMT, possibly due positioning but needs further assessment.    LOWER EXTREMITY MMT:    MMT Right eval Left eval  Hip flexion 3- 3+  Hip extension    Hip abduction 4 4  Hip adduction 3* 4  Hip internal rotation    Hip external rotation    Knee flexion * 4  Knee extension 3* 4+  Ankle dorsiflexion 4 4  Ankle plantarflexion    Ankle inversion    Ankle eversion     (Blank rows = not tested) "*" indicates pain-limited. Pt also might be self-limiting due to fear of pain with movement     FUNCTIONAL TESTS:  5 times sit to stand: 42.5 sec heavy use of UE to RW  6 minute walk test: deferred tp future visit 10 meter walk test: 0.16 sec with BRW  Berg Balance Scale: deferred to future visit                                                                                                                        TREATMENT DATE:   *Spanish interpreter present for patient's mother during visit today.  Pt session was cut a little short today due to pt arriving late to scheduled appointment time  TE: Nustep LE only level 1,2,3 ea x 2 min respectively  With 1 min rest between, to force R LE closed chain strength without any UE support   NMR:  Standing LLE on airex R LE on floor 2 x 10 lateral weight shifts   - same stance with R lateral reaching for ball for basketball shot x 35 reps   PATIENT EDUCATION:  Education details: assessment findings, goals, HEP, plan, safe technique with STS to walker Person educated: Patient and Parent Education method: Explanation, Demonstration, Tactile cues, Verbal cues, and Handouts Education comprehension: verbalized understanding, returned demonstration, verbal  cues required, tactile cues required, and needs further education  HOME EXERCISE PROGRAM: Reviewed: safe technique, signs and symptoms to watch for that proceeding is OK, stop if feeling increased pain/any sharp pain in knee Access Code: [email or web address] URL: [email or web address] Date: 10/18/2023 Prepared by: Aminta Kales  Exercises - Sit to Stand with Counter Support  - 1 x daily - 7 x weekly - 2 sets - 5 reps    ASSESSMENT:  CLINICAL IMPRESSION:  Patient arrived with good motivation for completion of pt activities.  Pt treatment focussed on activities to improve R LE weight shifting and closed chain strength. Pt session was cut a little short today due to pt arriving late to scheduled appointment time. Pt will continue to benefit from skilled physical therapy intervention to address impairments, improve QOL, and attain therapy goals.       OBJECTIVE IMPAIRMENTS: Abnormal gait, decreased activity tolerance, decreased balance, decreased coordination, decreased endurance, decreased mobility, difficulty walking, decreased ROM, decreased strength, increased edema, impaired flexibility, improper body mechanics, postural dysfunction, obesity, and pain.   ACTIVITY LIMITATIONS: carrying, lifting, bending, standing, squatting, stairs, transfers, toileting, dressing,  and locomotion level  PARTICIPATION LIMITATIONS: meal prep, cleaning, laundry, shopping, community activity, yard work, and school  PERSONAL FACTORS: Fitness, Sex, and 3+ comorbidities: PMH significant for DM II with neuropathy, HTN, thrombocytopenia, hypothyroidism, Prader-Willi syndrome,  are also affecting patient's functional outcome.   REHAB POTENTIAL: Good  CLINICAL DECISION MAKING: Evolving/moderate complexity  EVALUATION COMPLEXITY: Moderate   GOALS: Goals reviewed with patient? Yes  SHORT TERM GOALS: Target date: 11/29/2023  Patient will be independent in home exercise program to improve strength/mobility  for better functional independence with ADLs. Baseline: initiated Goal status: INITIAL    LONG TERM GOALS: Target date: 01/10/2024  Patient will increase lower extremity functional scale to >60/80 to demonstrate improved functional mobility and increased tolerance with ADLs. .  Baseline: deferred to future session Goal status: INITIAL  2.  Patient (> 29 years old) will complete five times sit to stand test in < 15 seconds indicating an increased LE strength and improved balance. Baseline: 42.5 sec, heavy use of BUE Goal status: INITIAL  3.  Patient will increase Berg Balance score by > 6 points to demonstrate decreased fall risk during functional activities Baseline: deferred to future session, may need to complete different balance test if pt has difficulty with cuing for test activities  Goal status: INITIAL  4.  Patient will increase 10 meter walk test to >1.44m/s as to improve gait speed for better community ambulation and to reduce fall risk. Baseline: 0.16 with BRW Goal status: INITIAL  5.  Patient will increase six minute walk test distance to >1000 for progression to community ambulator and improve gait ability. Baseline:  280 ft with RW Goal status: INITIAL    PLAN:  PT FREQUENCY: 1-2x/week  PT DURATION: 12 weeks  PLANNED INTERVENTIONS: [id number]- PT Re-evaluation, 97750- Physical Performance Testing, 97110-Therapeutic exercises, 97530- Therapeutic activity, [id number]- Neuromuscular re-education, 97535- Self Care, 82956- Manual therapy, [phone]- Gait training, [phone]- Orthotic Initial, [phone]- Orthotic/Prosthetic subsequent, [phone]- Canalith repositioning, [id number]- Splinting, L2440- Electrical stimulation (unattended), [phone]- Electrical stimulation (manual), [id number]- Ultrasound, Patient/Family education, Balance training, Stair training, Taping, Joint mobilization, Joint manipulation, Spinal mobilization, Scar mobilization, Vestibular training, DME instructions, Wheelchair mobility training,  Cryotherapy, and Moist heat.  PLAN FOR NEXT SESSION:  - Continue with  RLE muscle strengthening -Continue with more weight bearing activities for RLE with decreased UE support- Consider using blaze pods Continue with overall static and dynamic balance - add to HEP, and complete BERG as indicated/time permits - Test gait speed with RW to challenge patient to decrease UE support   Edwina Gram PT  Physical Therapist- [facility]  [facility]   9:47 AM 11/12/23

## 2023-11-15 ENCOUNTER — Ambulatory Visit: Payer: MEDICAID

## 2023-11-15 ENCOUNTER — Ambulatory Visit: Payer: MEDICAID | Admitting: Occupational Therapy

## 2023-11-15 DIAGNOSIS — R262 Difficulty in walking, not elsewhere classified: Secondary | ICD-10-CM

## 2023-11-15 DIAGNOSIS — M6281 Muscle weakness (generalized): Secondary | ICD-10-CM

## 2023-11-15 DIAGNOSIS — R2681 Unsteadiness on feet: Secondary | ICD-10-CM

## 2023-11-15 NOTE — Therapy (Signed)
 OUTPATIENT PHYSICAL THERAPY LE TREATMENT   Patient Name: Linda Mason MRN: [id number] DOB:06-Oct-1995, 28 y.o., female Today's Date: 11/15/2023  END OF SESSION:  PT End of Session - 11/15/23 1520     Visit Number 9    Number of Visits 25    Date for PT Re-Evaluation 01/10/24    Progress Note Due on Visit 10    PT Start Time 1531    PT Stop Time 1614    PT Time Calculation (min) 43 min    Equipment Utilized During Treatment Gait belt    Activity Tolerance Patient tolerated treatment well;Other (comment)   pt throughout reporting fear of movement   Behavior During Therapy WFL for tasks assessed/performed                 Past Medical History:  Diagnosis Date   Diabetes mellitus without complication [facility])    Past Surgical History:  Procedure Laterality Date   KNEE ARTHROSCOPY Right 08/27/2023   Procedure: ARTHROSCOPY KNEE;  Surgeon: Linda Rota, MD;  Location: [facility] ORS;  Service: Orthopedics;  Laterality: Right;   Patient Active Problem List   Diagnosis Date Noted   Closed patellar dislocation, right, initial encounter 09/01/2023   Patellar dislocation 08/26/2023   Type 2 diabetes mellitus without complication, with long-term current use of insulin  (HCC) 08/26/2023   Hypoxia 08/26/2023   Morbid obesity (HCC) 10/09/2021   Diabetes mellitus with neuropathy (HCC) 10/09/2021   Essential hypertension 10/09/2021   Thrombocytopenia (HCC) 10/09/2021   Hypothyroidism 10/09/2021   Prader-Willi syndrome 10/09/2021   Cellulitis 10/08/2021     PCP: [facility]  REFERRING PROVIDER: [name], PA-C   REFERRING DIAG:  Diagnosis  S83.004A (ICD-10-CM) - Closed patellar dislocation, right, initial encounter    Rationale for Evaluation and Treatment: Rehabilitation  THERAPY DIAG:  No diagnosis found.  ONSET DATE: 08/26/2023   SUBJECTIVE:                                                                                                                                                                                            SUBJECTIVE STATEMENT  Pt ambulating ten minutes/daily with her RW.  Pt has prom tomorrow and is excited.     From Eval: The pt is a pleasant 28 yo female. She presents in [facility] with her mom and an interpreter present for PT eval. Pt suffered a R knee dislocation following a fall 08/26/2023. Pt also found to have lateral femoral condyle impacting fx and medial patella impaction fracture of RLE. She underwent arthroscopic assisted R patellar reduction with lateral release on 08/27/2023. Pt admitted to  [facility]IR from 09/02/23 - 09/15/23/ Pt just had follow-up with surgeon and has been instructed to wean from hinged knee brace/has been released from care; she is WBAT. Pt's mother reports pt using 2WW to ambulate for short distances: maybe ambulating 30-50 steps at most without brace. Pt reports ambulation is limited by R knee pain. Her knee hurts most when walking. She has difficulty completing STS but reports she can do this on her own, although not able to do this yet in her school's bathroom and would like to work on this. Pt was independent with ADLs prior to injury. Her mother is helping her now with dressing. She has not yet used steps to enter her home, is currently using her WC on ramp. They report no other falls. Pt wants to improve ability to ambulate for her school prom. She likes to watch movies.  Pt has Prader-Willi syndrome, intellectual delay.   PERTINENT HISTORY:   PMH also significant for DM II with neuropathy, HTN, thrombocytopenia, hypothyroidism, Prader-Willi syndrome.    PAIN:  Are you having pain? None currently, takes medicine for her knee pain; no othe rpain  PRECAUTIONS:  Knee - WBAT  RED FLAGS: None   WEIGHT BEARING RESTRICTIONS:  Via surgeon note "1. I recommended the patient be weightbearing as tolerated and wean from the hinged knee brace."  FALLS:  Has patient fallen in last 6 months?  No  LIVING ENVIRONMENT: Lives with: lives with their family in one level home Stairs: 2 steps, handrails bilat to enter home, but also has two ramps that she uses to enter home and is not currently using steps Has following equipment at home: RW  OCCUPATION:  Student   PLOF:  Independent  PATIENT GOALS:  Pt wants to walk to be able to go to school prom; she wants to be able to dance.  NEXT MD VISIT: Had follow-up today,  was released from care but can follow-up as needed.    OBJECTIVE:  Note: Objective measures were completed at Evaluation unless otherwise noted.  DIAGNOSTIC FINDINGS:  Via chart, note Linda Chambers, MD "R Knee radiographs:  10/18/2023: 2 views (wheelchair bound lateral and oblique radiographs with suboptimal positioning) were obtained. Appropriate images cannot be obtained due to patient's hesitation/unwillingness to transfer to x-ray table. Within these limitations, there does not appear to be a recurrent patellar dislocation. There is significant osteopenia. Impaction fracture about the lateral femoral condyle is also visualized. It does not appear to fully involve the articular weightbearing surface. "  PATIENT SURVEYS:  LEFS deferred  COGNITIVE STATUS: Impaired at baseline   SENSATION: WFL BLE  COORDINATION: WFL rapid alt LE, unable to do heel>shin likely limited by body habitus and some hesitance/faer of movement  EDEMA:  Pt R knee still with slight edema around patella, no redness present, scars healing well  POSTURE:  rounded shoulders, increased fwd posture/kyphosis possibly impacted by body habitus    GAIT: Distance walked: (see below) Assistive device utilized: Environmental consultant - 2 wheeled Level of assistance: CGA Comments: Decreased gait speed, antalgic gait, decreased weightbearing and stance time on RLE, heavy BUE weightbearing through RW    Body Part #1 Knee  PALPATION: No pain with palpation superior, inferior, LTL patella,  posterior knee RLE  LOWER EXTREMITY ROM:    Need to complete formal assessment, however pt exhibited to have impaired R hip flexion mobility with MMT, possibly due positioning but needs further assessment.    LOWER EXTREMITY MMT:    MMT Right  eval Left eval  Hip flexion 3- 3+  Hip extension    Hip abduction 4 4  Hip adduction 3* 4  Hip internal rotation    Hip external rotation    Knee flexion * 4  Knee extension 3* 4+  Ankle dorsiflexion 4 4  Ankle plantarflexion    Ankle inversion    Ankle eversion     (Blank rows = not tested) "*" indicates pain-limited. Pt also might be self-limiting due to fear of pain with movement     FUNCTIONAL TESTS:  5 times sit to stand: 42.5 sec heavy use of UE to RW  6 minute walk test: deferred tp future visit 10 meter walk test: 0.16 sec with BRW  Berg Balance Scale: deferred to future visit                                                                                                                        TREATMENT DATE:    TA- Gait with RW 3x148 ft - last two laps cuing for increased speed with gait, pt able to adjust/increase speed  2 rounds of the following Standing at RW- LTL shifts 2x10 R and L FWD and back steps in place 10x each side, needs to hold on with L FWD step able to complete without UE support R FWD step   Standing twist 10x without UE support  STS 10x with BUE push off arm-rest STS 10x with green pad under LLE 10x with BUE push off arm rest  STS 5x cuing for full upright posture, UE placement and increased weightbearing through RLE - pt improves technique with cues   TE: Seated LAQ with 2#  alt LE 2 x10-12 -  difficult on RLE   Seated Hip flex 2#  2 x 10 bil - difficult on RLE  Seated LTL step onto green pad hip /ADD then abduction  2 x 10 each LE  Seated ham curl BTB 2 x 10 reps    *Spanish interpreter present for patient's mother during visit today.    PATIENT EDUCATION:  Education details: Pt  educated throughout session about proper posture and technique with exercises. Improved exercise technique, movement at target joints, use of target muscles after min to mod verbal, visual, tactile cues.  Person educated: Patient and Parent Education method: Explanation, Demonstration, Tactile cues, and Verbal cues Education comprehension: verbalized understanding, returned demonstration, verbal cues required, tactile cues required, and needs further education  HOME EXERCISE PROGRAM: Reviewed: safe technique, signs and symptoms to watch for that proceeding is OK, stop if feeling increased pain/any sharp pain in knee Access Code: [email or web address] URL: [email or web address] Date: 10/18/2023 Prepared by: Aminta Kales  Exercises - Sit to Stand with Counter Support  - 1 x daily - 7 x weekly - 2 sets - 5 reps    ASSESSMENT:  CLINICAL IMPRESSION: Continued plan as laid out in recent sessions with LE strengthening. Pt highly motivated throughout session. She was able to complete multiple laps  of ambulation with RW without difficulty and even increased gait speed with cuing.  Continued working on LTL weight shifting and increasing RLE weightbearing. Pt still requires some UE support when having to rely on RLE for FWD step, but does exhibit some improvement with standing balance and even addition of some dynamic activities. Pt will continue to benefit from skilled physical therapy intervention to address impairments, improve QOL, and attain therapy goals.       OBJECTIVE IMPAIRMENTS: Abnormal gait, decreased activity tolerance, decreased balance, decreased coordination, decreased endurance, decreased mobility, difficulty walking, decreased ROM, decreased strength, increased edema, impaired flexibility, improper body mechanics, postural dysfunction, obesity, and pain.   ACTIVITY LIMITATIONS: carrying, lifting, bending, standing, squatting, stairs, transfers, toileting, dressing, and locomotion  level  PARTICIPATION LIMITATIONS: meal prep, cleaning, laundry, shopping, community activity, yard work, and school  PERSONAL FACTORS: Fitness, Sex, and 3+ comorbidities: PMH significant for DM II with neuropathy, HTN, thrombocytopenia, hypothyroidism, Prader-Willi syndrome,  are also affecting patient's functional outcome.   REHAB POTENTIAL: Good  CLINICAL DECISION MAKING: Evolving/moderate complexity  EVALUATION COMPLEXITY: Moderate   GOALS: Goals reviewed with patient? Yes  SHORT TERM GOALS: Target date: 11/29/2023  Patient will be independent in home exercise program to improve strength/mobility for better functional independence with ADLs. Baseline: initiated Goal status: INITIAL    LONG TERM GOALS: Target date: 01/10/2024  Patient will increase lower extremity functional scale to >60/80 to demonstrate improved functional mobility and increased tolerance with ADLs. .  Baseline: deferred to future session Goal status: INITIAL  2.  Patient (> 39 years old) will complete five times sit to stand test in < 15 seconds indicating an increased LE strength and improved balance. Baseline: 42.5 sec, heavy use of BUE Goal status: INITIAL  3.  Patient will increase Berg Balance score by > 6 points to demonstrate decreased fall risk during functional activities Baseline: deferred to future session, may need to complete different balance test if pt has difficulty with cuing for test activities  Goal status: INITIAL  4.  Patient will increase 10 meter walk test to >1.56m/s as to improve gait speed for better community ambulation and to reduce fall risk. Baseline: 0.16 with BRW Goal status: INITIAL  5.  Patient will increase six minute walk test distance to >1000 for progression to community ambulator and improve gait ability. Baseline:  280 ft with RW Goal status: INITIAL    PLAN:  PT FREQUENCY: 1-2x/week  PT DURATION: 12 weeks  PLANNED INTERVENTIONS: 97164- PT Re-evaluation,  97750- Physical Performance Testing, 97110-Therapeutic exercises, 97530- Therapeutic activity, [id number]- Neuromuscular re-education, 97535- Self Care, 16109- Manual therapy, [phone]- Gait training, [phone]- Orthotic Initial, [phone]- Orthotic/Prosthetic subsequent, [phone]- Canalith repositioning, [id number]- Splinting, [id number]- Electrical stimulation (unattended), [phone]- Electrical stimulation (manual), [id number]- Ultrasound, Patient/Family education, Balance training, Stair training, Taping, Joint mobilization, Joint manipulation, Spinal mobilization, Scar mobilization, Vestibular training, DME instructions, Wheelchair mobility training, Cryotherapy, and Moist heat.  PLAN FOR NEXT SESSION:  - Continue with  RLE muscle strengthening -Continue with more weight bearing activities for RLE with decreased UE support- Consider using blaze pods Continue with overall static and dynamic balance - add to HEP, and complete BERG as indicated/time permits - Test gait speed with RW to challenge patient to decrease UE support   Samie Crews PT  Physical Therapist- [facility]   5:03 PM 11/15/23

## 2023-11-15 NOTE — Therapy (Signed)
 Occupational Therapy Progress Note  Dates of reporting period  10/14/23   to   11/15/23  Patient Name: Linda Mason [facility] MRN: [id number] DOB:12/11/1995, 28 y.o., female Today's Date: 11/15/2023  [facility]: [name] REFERRING PROVIDER: Georjean Kite  END OF SESSION:  OT End of Session - 11/15/23 1711     Visit Number 10    Number of Visits 24    Date for OT Re-Evaluation 01/06/24    OT Start Time 1452    OT Stop Time 1530    OT Time Calculation (min) 38 min    Activity Tolerance Patient tolerated treatment well    Behavior During Therapy [facility] for tasks assessed/performed             Past Medical History:  Diagnosis Date   Diabetes mellitus without complication [facility])    Past Surgical History:  Procedure Laterality Date   KNEE ARTHROSCOPY Right 08/27/2023   Procedure: ARTHROSCOPY KNEE;  Surgeon: [name], MD;  Location: [facility] ORS;  Service: Orthopedics;  Laterality: Right;   Patient Active Problem List   Diagnosis Date Noted   Closed patellar dislocation, right, initial encounter 09/01/2023   Patellar dislocation 08/26/2023   Type 2 diabetes mellitus without complication, with long-term current use of insulin  (HCC) 08/26/2023   Hypoxia 08/26/2023   Morbid obesity (HCC) 10/09/2021   Diabetes mellitus with neuropathy (HCC) 10/09/2021   Essential hypertension 10/09/2021   Thrombocytopenia (HCC) 10/09/2021   Hypothyroidism 10/09/2021   Prader-Willi syndrome 10/09/2021   Cellulitis 10/08/2021   ONSET DATE: 08/26/2023  REFERRING DIAG: Right knee patella fracture  THERAPY DIAG:  Muscle weakness (generalized)  Rationale for Evaluation and Treatment: Rehabilitation  SUBJECTIVE:   SUBJECTIVE STATEMENT:  Pt. reports being excited  for the prom tomorrow  Pt accompanied by: family member and interpreter: Camila - Spanish interpreter  PERTINENT HISTORY: Linda Mason is a 28 year old right-handed morbidly obese female and BMI 44.88 as  well as diabetes mellitus, Prader-Willi syndrome, intellectual delay. Presented to [facility] ED on 08/26/2023 after mechanical fall getting out of her bed. X-rays and imaging revealed right knee irreducible patellar dislocation right knee lateral femoral condyle impacting fracture as well as right knee medial patella impaction fracture. Pt underwent right knee arthroscopic assisted patellar reduction with lateral release on 08/27/2023 per Dr. [name].  Weightbearing as tolerated right lower extremity knee immobilizer when out of bed or when walking. Pt admitted to CIR from 09/02/23 - 09/15/23  PRECAUTIONS: Knee , Knee Immobilizer    WEIGHT BEARING RESTRICTIONS: Yes WBAT  PAIN:  Are you having pain? Yes: NPRS scale: 0/10 Pain location: knee  FALLS: Has patient fallen in last 6 months? No  LIVING ENVIRONMENT: Lives with: lives with their family Lives in: House/apartment Stairs: No ramp Has following equipment at home: Wheelchair (manual)  PLOF: Independent with basic ADLs  PATIENT GOALS: to return to school and go to [employer] with no fear of falling   NEXT MD VISIT: none  OBJECTIVE:  Note: Objective measures were completed at Evaluation unless otherwise noted.  HAND DOMINANCE: Left  ADLs: Transfers/ambulation related to ADLs: SUPERVISION short distances using RW, MOD I using w/c Eating: IND Grooming: IND Upper body dressing: SETUP assist Lower body dressing: MAX A Toileting: SUPERVISION Bathing: MOD A bathing   Tub shower transfers: SUPERVISION Equipment: Shower seat with back, Walk in shower, and bed side commode  UPPER EXTREMITY ROM:     Active ROM Right  WFL Left  [facility]    UPPER  EXTREMITY MMT:   Question accuracy given difficulty following commands   MMT Right eval Left eval  Shoulder flexion 3+/5 3+/5  Shoulder abduction    Elbow flexion 3+/5 3+/5  Elbow extension 3+/5 3+/5  Wrist flexion    Wrist extension    (Blank rows = not tested)  HAND FUNCTION: Grip  strength: Right: 10 lbs; Left: 15 lbs, Lateral pinch: Right: 4 lbs, Left: 5 lbs, and 3 point pinch: Right: 1 lbs, Left: 1 lbs  COORDINATION: 9 Hole Peg test: Right: 40 sec; Left: 30 sec  SENSATION: Light touch: Impaired  tingling  EDEMA: none  COGNITION: Overall cognitive status: History of cognitive impairments - at baseline Areas of impairment: Areas of impairment: Problem solving  Commands: Follows one step commands with increased time Behavior: Perseveration and anxious  OBSERVATIONS: Pt appears anxious and looking toward her mother for answers to questions.    TREATMENT DATE: 11/15/23  Therapeutic Ex.:   Pt. Worked on BUE strengthening, and reciprocal motion using the UBE while seated for 8 min. with minimal resistance.     Self Care:   -minA donning,and doffing the right sock with A/E (dressing stick, sockaide, and reacher use) -ModA donning/doffing shoes with elastic shoelaces, and LH shoehorn -Pt. Was provided with elastic shoelaces   PATIENT EDUCATION: Education details: Bathroom mobility progressions with less use of wc and increased use of walker Person educated: Counselling psychologist, pt Education method: Medical illustrator Education comprehension: verbalized understanding, mother receptive  HOME EXERCISE PROGRAM:  Theraputty  GOALS: Goals reviewed with patient? Yes    SHORT TERM GOALS: Target date: 11/24/23  Pt will INDEPENDENTLY complete HEP  Baseline: 11/15/2023: Supervision, cues 10/14/23: no HEP issued Goal status: INITIAL  LONG TERM GOALS: Target date: 01/06/24  Pt will increase B strength by 15# to assist with gripping grab bar for safe transfers   Baseline: 11/15/23: Pt. Is able to grip  the grab bar. 10/14/23: L 15#, R 10#  Goal status:  progressing, ongoing  2.  Pt will INDEPENDENTLY transfer to regular height toilet  Baseline: 11/15/23: Modified Independent toileting at home, Supervision using the hospital clinic bathroom Eval: SUPERVISION + RW for  elevated toilet, MIN A + RW for regular toilet Goal status: progressing, ongoing 3.  Pt will improve B FMC by decreasing 9 hole PEG test score by 10 seconds for participation in leisure activities (coloring).  Baseline: 11/15/23:Continue Eval: L 30", R 40" Goal status: Ongoing  4.  Pt will verbalize x3 strategies to manage anxiousness and fear of falling to reduce fall risk. Baseline: 11/15/23: Consistent cues required Eval: 0/3 strategies.  Goal status: Ongoing  5.  Pt will increase B lateral pinch strength by 5# to manage ADL items.  Baseline: Eval: Continue R 4#, L 5# Goal status: Ongoing  6. Pt. Will perform LE dressing with Supervision with A/E use. Baseline: Eval: MinA donning/doffing socks, ModA shoes Goal Status: Ongoing    ASSESSMENT:  CLINICAL IMPRESSION:  Pt. has made progress overall  with OT services. Pt. is making progress with toileting skills, clothing management, and hand hygiene. Pt.'s mother reports that the Pt. Is toileting at home now with Modified independence. Pt. presents presents with difficulty performing LE dressing donning, and doffing shoe, and socks. A new goal was added to improve LE dressing tasks with A/E.  Pt. continues to benefit from OT services to improve bilateral strength/dexterity, toilet t/f's/bathroom ADLs/functional mobility within bathroom with wc vs walker for safe return to school.   PERFORMANCE DEFICITS: in  functional skills including ADLs, coordination, strength, pain, and balance, cognitive skills including attention and problem solving, and psychosocial skills including coping strategies and environmental adaptation.   IMPAIRMENTS: are limiting patient from ADLs and leisure.   COMORBIDITIES: has co-morbidities such as Prader-Willi syndrome, intellectual delay that affect occupational performance. Patient will benefit from skilled OT to address above impairments and improve overall function.  MODIFICATION OR ASSISTANCE TO COMPLETE  EVALUATION: Min-Moderate modification of tasks or assist with assess necessary to complete an evaluation.  OT OCCUPATIONAL PROFILE AND HISTORY: Detailed assessment: Review of records and additional review of physical, cognitive, psychosocial history related to current functional performance.  CLINICAL DECISION MAKING: Moderate - several treatment options, min-mod task modification necessary  REHAB POTENTIAL: Good  EVALUATION COMPLEXITY: Moderate      PLAN:  OT FREQUENCY: 1-2x/week  OT DURATION: 12 weeks  PLANNED INTERVENTIONS: 97535 self care/ADL training, 16109 therapeutic exercise, 97530 therapeutic activity, patient/family education, and DME and/or AE instructions  RECOMMENDED OTHER SERVICES: PT  CONSULTED AND AGREED WITH PLAN OF CARE: Patient and family member/caregiver  PLAN FOR NEXT SESSION: ADLs  Duey Ghent, MS, OTR/L    11/15/2023, 5:14 PM

## 2023-11-18 ENCOUNTER — Ambulatory Visit: Payer: MEDICAID | Admitting: Physical Therapy

## 2023-11-18 ENCOUNTER — Ambulatory Visit: Payer: MEDICAID | Admitting: Occupational Therapy

## 2023-11-22 ENCOUNTER — Ambulatory Visit: Payer: MEDICAID

## 2023-11-22 ENCOUNTER — Ambulatory Visit: Payer: MEDICAID | Admitting: Physical Therapy

## 2023-11-24 ENCOUNTER — Ambulatory Visit: Payer: MEDICAID

## 2023-11-24 ENCOUNTER — Ambulatory Visit: Payer: MEDICAID | Admitting: Physical Therapy

## 2023-11-24 DIAGNOSIS — G8929 Other chronic pain: Secondary | ICD-10-CM

## 2023-11-24 DIAGNOSIS — M6281 Muscle weakness (generalized): Secondary | ICD-10-CM | POA: Diagnosis not present

## 2023-11-24 DIAGNOSIS — R262 Difficulty in walking, not elsewhere classified: Secondary | ICD-10-CM

## 2023-11-24 DIAGNOSIS — S83004A Unspecified dislocation of right patella, initial encounter: Secondary | ICD-10-CM

## 2023-11-24 DIAGNOSIS — R278 Other lack of coordination: Secondary | ICD-10-CM

## 2023-11-24 DIAGNOSIS — R2681 Unsteadiness on feet: Secondary | ICD-10-CM

## 2023-11-24 NOTE — Therapy (Signed)
 OUTPATIENT PHYSICAL THERAPY LE TREATMENT   Patient Name: Linda Mason MRN: [id number] DOB:1996-05-12, 28 y.o., female Today's Date: 11/24/2023  END OF SESSION:  PT End of Session - 11/24/23 0818     Visit Number 10    Number of Visits 25    Date for PT Re-Evaluation 01/10/24    Progress Note Due on Visit 10    PT Start Time 0819    PT Stop Time 0845    PT Time Calculation (min) 26 min    Equipment Utilized During Treatment Gait belt    Activity Tolerance Patient tolerated treatment well    Behavior During Therapy [facility] for tasks assessed/performed                 Past Medical History:  Diagnosis Date   Diabetes mellitus without complication (HCC)    Past Surgical History:  Procedure Laterality Date   KNEE ARTHROSCOPY Right 08/27/2023   Procedure: ARTHROSCOPY KNEE;  Surgeon: [name], MD;  Location: [facility] ORS;  Service: Orthopedics;  Laterality: Right;   Patient Active Problem List   Diagnosis Date Noted   Closed patellar dislocation, right, initial encounter 09/01/2023   Patellar dislocation 08/26/2023   Type 2 diabetes mellitus without complication, with long-term current use of insulin  (HCC) 08/26/2023   Hypoxia 08/26/2023   Morbid obesity (HCC) 10/09/2021   Diabetes mellitus with neuropathy (HCC) 10/09/2021   Essential hypertension 10/09/2021   Thrombocytopenia (HCC) 10/09/2021   Hypothyroidism 10/09/2021   Prader-Willi syndrome 10/09/2021   Cellulitis 10/08/2021     PCP: [name]  REFERRING PROVIDER: [name], PA-C   REFERRING DIAG:  Diagnosis  S83.004A (ICD-10-CM) - Closed patellar dislocation, right, initial encounter    Rationale for Evaluation and Treatment: Rehabilitation  THERAPY DIAG:  Muscle weakness (generalized)  Difficulty in walking, not elsewhere classified  Unsteadiness on feet  Chronic pain of right knee  Other lack of coordination  Closed patellar dislocation, right, initial  encounter  ONSET DATE: 08/26/2023   SUBJECTIVE:                                                                                                                                                                                           SUBJECTIVE STATEMENT  Arrived late to PT treatment due to traffic and weather issues. No other updates medically, but was able to attend Prom dance last week. Had a good time.     From Eval: The pt is a pleasant 28 yo female. She presents in [facility] with her mom and an interpreter present for PT eval. Pt suffered a R knee dislocation following a  fall 08/26/2023. Pt also found to have lateral femoral condyle impacting fx and medial patella impaction fracture of RLE. She underwent arthroscopic assisted R patellar reduction with lateral release on 08/27/2023. Pt admitted to [facility] from 09/02/23 - 09/15/23/ Pt just had follow-up with surgeon and has been instructed to wean from hinged knee brace/has been released from care; she is WBAT. Pt's mother reports pt using 2WW to ambulate for short distances: maybe ambulating 30-50 steps at most without brace. Pt reports ambulation is limited by R knee pain. Her knee hurts most when walking. She has difficulty completing STS but reports she can do this on her own, although not able to do this yet in her school's bathroom and would like to work on this. Pt was independent with ADLs prior to injury. Her mother is helping her now with dressing. She has not yet used steps to enter her home, is currently using her WC on ramp. They report no other falls. Pt wants to improve ability to ambulate for her school prom. She likes to watch movies.  Pt has Prader-Willi syndrome, intellectual delay.   PERTINENT HISTORY:   PMH also significant for DM II with neuropathy, HTN, thrombocytopenia, hypothyroidism, Prader-Willi syndrome.    PAIN:  Are you having pain? None currently, takes medicine for her knee pain; no othe rpain  PRECAUTIONS:  Knee -  WBAT  RED FLAGS: None   WEIGHT BEARING RESTRICTIONS:  Via surgeon note "1. I recommended the patient be weightbearing as tolerated and wean from the hinged knee brace."  FALLS:  Has patient fallen in last 6 months? No  LIVING ENVIRONMENT: Lives with: lives with their family in one level home Stairs: 2 steps, handrails bilat to enter home, but also has two ramps that she uses to enter home and is not currently using steps Has following equipment at home: RW  OCCUPATION:  Student   PLOF:  Independent  PATIENT GOALS:  Pt wants to walk to be able to go to school prom; she wants to be able to dance.  NEXT MD VISIT: Had follow-up today,  was released from care but can follow-up as needed.    OBJECTIVE:  Note: Objective measures were completed at Evaluation unless otherwise noted.  DIAGNOSTIC FINDINGS:  Via chart, note [name], MD "R Knee radiographs:  10/18/2023: 2 views (wheelchair bound lateral and oblique radiographs with suboptimal positioning) were obtained. Appropriate images cannot be obtained due to patient's hesitation/unwillingness to transfer to x-ray table. Within these limitations, there does not appear to be a recurrent patellar dislocation. There is significant osteopenia. Impaction fracture about the lateral femoral condyle is also visualized. It does not appear to fully involve the articular weightbearing surface. "  PATIENT SURVEYS:  LEFS deferred  COGNITIVE STATUS: Impaired at baseline   SENSATION: WFL BLE  COORDINATION: WFL rapid alt LE, unable to do heel>shin likely limited by body habitus and some hesitance/faer of movement  EDEMA:  Pt R knee still with slight edema around patella, no redness present, scars healing well  POSTURE:  rounded shoulders, increased fwd posture/kyphosis possibly impacted by body habitus    GAIT: Distance walked: (see below) Assistive device utilized: Environmental consultant - 2 wheeled Level of assistance:  CGA Comments: Decreased gait speed, antalgic gait, decreased weightbearing and stance time on RLE, heavy BUE weightbearing through RW    Body Part #1 Knee  PALPATION: No pain with palpation superior, inferior, LTL patella, posterior knee RLE  LOWER EXTREMITY ROM:    Need  to complete formal assessment, however pt exhibited to have impaired R hip flexion mobility with MMT, possibly due positioning but needs further assessment.    LOWER EXTREMITY MMT:    MMT Right eval Left eval  Hip flexion 3- 3+  Hip extension    Hip abduction 4 4  Hip adduction 3* 4  Hip internal rotation    Hip external rotation    Knee flexion * 4  Knee extension 3* 4+  Ankle dorsiflexion 4 4  Ankle plantarflexion    Ankle inversion    Ankle eversion     (Blank rows = not tested) "*" indicates pain-limited. Pt also might be self-limiting due to fear of pain with movement     FUNCTIONAL TESTS:  5 times sit to stand: 42.5 sec heavy use of UE to RW  6 minute walk test: deferred tp future visit 10 meter walk test: 0.16 sec with BRW  Berg Balance Scale: deferred to future visit                                                                                                                        TREATMENT DATE:  PT instructed pt in goal assessment for progress note.   10 Meter Walk Test: Patient instructed to walk 10 meters (32.8 ft) as quickly and as safely as possible at their normal speed x2 and at a fast speed x2. Time measured from 2 meter mark to 8 meter mark to accommodate ramp-up and ramp-down.   Average Normal speed: 0.27m/s with RW   Cut off scores: <0.4 m/s = household Ambulator, 0.4-0.8 m/s = limited community Ambulator, >0.8 m/s = community Ambulator, >1.2 m/s = crossing a street, <1.0 = increased fall risk MCID 0.05 m/s (small), 0.13 m/s (moderate), 0.06 m/s (significant)  (ANPTA Core Set of Outcome Measures for Adults with Neurologic Conditions, 2018)  6 Min Walk Test:  Instructed  patient to ambulate as quickly and as safely as possible for 6 minutes using LRAD. Patient was allowed to take standing rest breaks without stopping the test, but if the patient required a sitting rest break the clock would be stopped and the test would be over.  Results: 350 feet using a RW with supervision assist and mild antalgic gait pattern. Results indicate that the patient has reduced endurance with ambulation compared to age matched norms.  Age Matched Norms: 2-69 yo M: 35 F: 18, 52-79 yo M: 86 F: 471, 55-89 yo M: 417 F: 392 MDC: 58.21 meters (190.98 feet) or 50 meters (ANPTA Core Set of Outcome Measures for Adults with Neurologic Conditions, 2018)  Patient demonstrates increased fall risk as noted by score of   33/56 on Berg Balance Scale.  (<36= high risk for falls, close to 100%; 37-45 significant >80%; 46-51 moderate >50%; 52-55 lower >25%)   [facility] PT Assessment - 11/24/23 0001       Berg Balance Test   Sit to Stand Able to stand  independently using hands  Standing Unsupported Able to stand safely 2 minutes    Sitting with Back Unsupported but Feet Supported on Floor or Stool Able to sit safely and securely 2 minutes    Stand to Sit Controls descent by using hands    Transfers Able to transfer safely, definite need of hands    Standing Unsupported with Eyes Closed Able to stand 10 seconds safely    Standing Unsupported with Feet Together Able to place feet together independently but unable to hold for 30 seconds    From Standing, Reach Forward with Outstretched Arm Can reach forward >12 cm safely (5")    From Standing Position, Pick up Object from Floor Unable to pick up and needs supervision    From Standing Position, Turn to Look Behind Over each Shoulder Turn sideways only but maintains balance    Turn 360 Degrees Needs close supervision or verbal cueing    Standing Unsupported, Alternately Place Feet on Step/Stool Able to complete >2 steps/needs minimal assist     Standing Unsupported, One Foot in Front Able to take small step independently and hold 30 seconds    Standing on One Leg Unable to try or needs assist to prevent fall    Total Score 33             *Spanish interpreter present for patient's mother during visit today.    PATIENT EDUCATION:  Education details: Pt educated throughout session about proper posture and technique with exercises. Improved exercise technique, movement at target joints, use of target muscles after min to mod verbal, visual, tactile cues.  Person educated: Patient and Parent Education method: Explanation, Demonstration, Tactile cues, and Verbal cues Education comprehension: verbalized understanding, returned demonstration, verbal cues required, tactile cues required, and needs further education  HOME EXERCISE PROGRAM: Reviewed: safe technique, signs and symptoms to watch for that proceeding is OK, stop if feeling increased pain/any sharp pain in knee Access Code: [email or web address] URL: [email or web address] Date: 10/18/2023 Prepared by: Aminta Kales  Exercises - Sit to Stand with Counter Support  - 1 x daily - 7 x weekly - 2 sets - 5 reps    ASSESSMENT:  CLINICAL IMPRESSION: Continued plan as laid out in recent sessions Instructed pt in progress not assessment. Improved function and increased safety noted by increased 5x STS to <15 sec, but UE support  required. Increased distance on walk test. Pt also able to complete Berg balance scale with improved tolerance to WB on the affected LE. Pt highly motivated throughout session. She was able to complete multiple laps of ambulation with RW without difficulty and even increased gait speed with cuing.  Continued working on LTL weight shifting and increasing RLE weightbearing. Pt still requires some UE support when having to rely on RLE for FWD step, but does exhibit some improvement with standing balance and even addition of some dynamic activities.  Patient's condition has the potential to improve in response to therapy. Maximum improvement is yet to be obtained. The anticipated improvement is attainable and reasonable in a generally predictable time.  Pt will continue to benefit from skilled physical therapy intervention to address impairments, improve QOL, and attain therapy goals.       OBJECTIVE IMPAIRMENTS: Abnormal gait, decreased activity tolerance, decreased balance, decreased coordination, decreased endurance, decreased mobility, difficulty walking, decreased ROM, decreased strength, increased edema, impaired flexibility, improper body mechanics, postural dysfunction, obesity, and pain.   ACTIVITY LIMITATIONS: carrying, lifting, bending, standing, squatting, stairs, transfers, toileting, dressing, and locomotion level  PARTICIPATION  LIMITATIONS: meal prep, cleaning, laundry, shopping, community activity, yard work, and school  PERSONAL FACTORS: Fitness, Sex, and 3+ comorbidities: PMH significant for DM II with neuropathy, HTN, thrombocytopenia, hypothyroidism, Prader-Willi syndrome,  are also affecting patient's functional outcome.   REHAB POTENTIAL: Good  CLINICAL DECISION MAKING: Evolving/moderate complexity  EVALUATION COMPLEXITY: Moderate   GOALS: Goals reviewed with patient? Yes  SHORT TERM GOALS: Target date: 11/29/2023  Patient will be independent in home exercise program to improve strength/mobility for better functional independence with ADLs. Baseline: initiated Goal status: INITIAL    LONG TERM GOALS: Target date: 01/10/2024  Patient will increase lower extremity functional scale to >60/80 to demonstrate improved functional mobility and increased tolerance with ADLs. .  Baseline: deferred Goal status: INITIAL  2.  Patient (> 28 years old) will complete five times sit to stand test in < 15 seconds indicating an increased LE strength and improved balance. Baseline: 42.5 sec, heavy use of BUE 5/21: 14.27  sec.  Heavy use of BUE on RW  Goal status: IN PROGRESS  3.  Patient will increase Berg Balance score by > 6 points to demonstrate decreased fall risk during functional activities Baseline: deferred to future session, may need to complete different balance test if pt has difficulty with cuing for test activities  5/21: 33/56 Goal status: IN PROGRESS  4.  Patient will increase 10 meter walk test to >1.29m/s as to improve gait speed for better community ambulation and to reduce fall risk. Baseline: 0.16 with BRW 5/21: 0.21m/s with RW  Goal status: IN PROGRESS  5.  Patient will increase six minute walk test distance to >1000 for progression to community ambulator and improve gait ability. Baseline:  280 ft with RW 5/21: 376ft with RW  Goal status: IN PROGRESS    PLAN:  PT FREQUENCY: 1-2x/week  PT DURATION: 12 weeks  PLANNED INTERVENTIONS: [id number]- PT Re-evaluation, 97750- Physical Performance Testing, 97110-Therapeutic exercises, 97530- Therapeutic activity, [id number]- Neuromuscular re-education, 97535- Self Care, [id number]- Manual therapy, [phone]- Gait training, [phone]- Orthotic Initial, [phone]- Orthotic/Prosthetic subsequent, [phone]- Canalith repositioning, [id number]- Splinting, [id number]- Electrical stimulation (unattended), [phone]- Electrical stimulation (manual), [id number]- Ultrasound, Patient/Family education, Balance training, Stair training, Taping, Joint mobilization, Joint manipulation, Spinal mobilization, Scar mobilization, Vestibular training, DME instructions, Wheelchair mobility training, Cryotherapy, and Moist heat.  PLAN FOR NEXT SESSION:  - Continue with  RLE muscle strengthening -Continue with more weight bearing activities for RLE with decreased UE support- Consider using blaze pods Continue with overall static and dynamic balance - add to HEP, and complete BERG as indicated/time permits - Test gait speed with RW to challenge patient to decrease UE support   Barbara Book PT  Physical  Therapist- [facility]  [facility]   8:22 AM 11/24/23

## 2023-11-25 NOTE — Therapy (Signed)
 Occupational Therapy Progress Note Dates of reporting period  10/14/23-11/15/23  Patient Name: Linda Mason [facility] MRN: [id number] DOB:[birth year], 28 y.o., female Today's Date: 11/25/2023  PCP: Justo Opal, [facility] REFERRING PROVIDER: Georjean Kite  END OF SESSION:  OT End of Session - 11/25/23 1600     Visit Number 11    Number of Visits 24    Date for OT Re-Evaluation 01/06/24    OT Start Time 0845    OT Stop Time 0930    OT Time Calculation (min) 45 min    Equipment Utilized During Treatment RW    Activity Tolerance Patient tolerated treatment well    Behavior During Therapy [facility] for tasks assessed/performed            Past Medical History:  Diagnosis Date   Diabetes mellitus without complication [facility])    Past Surgical History:  Procedure Laterality Date   KNEE ARTHROSCOPY Right 08/27/2023   Procedure: ARTHROSCOPY KNEE;  Surgeon: [name], MD;  Location: [facility] ORS;  Service: Orthopedics;  Laterality: Right;   Patient Active Problem List   Diagnosis Date Noted   Closed patellar dislocation, right, initial encounter 09/01/2023   Patellar dislocation 08/26/2023   Type 2 diabetes mellitus without complication, with long-term current use of insulin  (HCC) 08/26/2023   Hypoxia 08/26/2023   Morbid obesity (HCC) 10/09/2021   Diabetes mellitus with neuropathy (HCC) 10/09/2021   Essential hypertension 10/09/2021   Thrombocytopenia (HCC) 10/09/2021   Hypothyroidism 10/09/2021   Prader-Willi syndrome 10/09/2021   Cellulitis 10/08/2021   ONSET DATE: 08/26/2023  REFERRING DIAG: Right knee patella fracture  THERAPY DIAG:  Muscle weakness (generalized)  Other lack of coordination  Rationale for Evaluation and Treatment: Rehabilitation  SUBJECTIVE:   SUBJECTIVE STATEMENT: Pt was excited to report that she started back at school and had a great time at her prom.  Pt accompanied by: family member and interpreter: Camila - Spanish  interpreter  PERTINENT HISTORY: Linda Mason is a 28 year old right-handed morbidly obese female and BMI 44.88 as well as diabetes mellitus, Prader-Willi syndrome, intellectual delay. Presented to [facility] ED on 08/26/2023 after mechanical fall getting out of her bed. X-rays and imaging revealed right knee irreducible patellar dislocation right knee lateral femoral condyle impacting fracture as well as right knee medial patella impaction fracture. Pt underwent right knee arthroscopic assisted patellar reduction with lateral release on 08/27/2023 per Dr. [name].  Weightbearing as tolerated right lower extremity knee immobilizer when out of bed or when walking. Pt admitted to CIR from 09/02/23 - 09/15/23  PRECAUTIONS: Knee , Knee Immobilizer    WEIGHT BEARING RESTRICTIONS: Yes WBAT  PAIN:  Are you having pain? Yes: NPRS scale: 0/10 Pain location: knee  FALLS: Has patient fallen in last 6 months? No  LIVING ENVIRONMENT: Lives with: lives with their family Lives in: House/apartment Stairs: No ramp Has following equipment at home: Wheelchair (manual)  PLOF: Independent with basic ADLs  PATIENT GOALS: to return to school and go to [employer] with no fear of falling   NEXT MD VISIT: none  OBJECTIVE:  Note: Objective measures were completed at Evaluation unless otherwise noted.  HAND DOMINANCE: Left  ADLs: Transfers/ambulation related to ADLs: SUPERVISION short distances using RW, MOD I using w/c Eating: IND Grooming: IND Upper body dressing: SETUP assist Lower body dressing: MAX A Toileting: SUPERVISION Bathing: MOD A bathing   Tub shower transfers: SUPERVISION Equipment: Shower seat with back, Walk in shower, and bed side commode  UPPER EXTREMITY ROM:  Active ROM Right  WFL Left  WFL    UPPER EXTREMITY MMT:   Question accuracy given difficulty following commands   MMT Right eval Left eval  Shoulder flexion 3+/5 3+/5  Shoulder abduction    Elbow flexion 3+/5 3+/5   Elbow extension 3+/5 3+/5  Wrist flexion    Wrist extension    (Blank rows = not tested)  HAND FUNCTION: Grip strength: Right: 10 lbs; Left: 15 lbs, Lateral pinch: Right: 4 lbs, Left: 5 lbs, and 3 point pinch: Right: 1 lbs, Left: 1 lbs  COORDINATION: 9 Hole Peg test: Right: 40 sec; Left: 30 sec  SENSATION: Light touch: Impaired  tingling  EDEMA: none  COGNITION: Overall cognitive status: History of cognitive impairments - at baseline Areas of impairment: Areas of impairment: Problem solving  Commands: Follows one step commands with increased time Behavior: Perseveration and anxious  OBSERVATIONS: Pt appears anxious and looking toward her mother for answers to questions.    TREATMENT DATE: 11/24/23 Therapeutic Exercise: -Bilat grip strengthening: Hand gripper set at 6.6# to place/remove jumbo pegs from pegboard for 2 trials each hand; min A for grip set up and grip adjustment on hand gripper -Pinch strengthening: pt worked to place and remove therapy resistant clothespins on/off a vertical dowel for all colors on each hand, alternating between a lateral and 3 point pinch prehension pattern  Therapeutic Activity: -Facilitated L/R combination movements targeting forearm, wrist, and hand strengthening with participation in EZ board tools.  Pt worked with long handled tool to facilitate L/R wrist flex/ext, small and large base key turn, and large dial turn x3 reps for each tool (up/down board=1 rep).  Rest breaks between sets, min-mod vc for technique, and intermittent min A to keep tools level on board during rotations.  PATIENT EDUCATION: Education details: BUE strengthening Person educated: Counselling psychologist, pt Education method: Medical illustrator Education comprehension: verbalized understanding, mother receptive  HOME EXERCISE PROGRAM:  Theraputty  GOALS: Goals reviewed with patient? Yes    SHORT TERM GOALS: Target date: 11/24/23  Pt will INDEPENDENTLY complete HEP   Baseline: 11/15/2023: Supervision, cues 10/14/23: no HEP issued Goal status: INITIAL  LONG TERM GOALS: Target date: 01/06/24  Pt will increase B strength by 15# to assist with gripping grab bar for safe transfers   Baseline: 11/15/23: Pt. Is able to grip  the grab bar. 10/14/23: L 15#, R 10#  Goal status:  progressing, ongoing  2.  Pt will INDEPENDENTLY transfer to regular height toilet  Baseline: 11/15/23: Modified Independent toileting at home, Supervision using the hospital clinic bathroom Eval: SUPERVISION + RW for elevated toilet, MIN A + RW for regular toilet Goal status: progressing, ongoing 3.  Pt will improve B FMC by decreasing 9 hole PEG test score by 10 seconds for participation in leisure activities (coloring).  Baseline: 11/15/23:Continue Eval: L 30", R 40" Goal status: Ongoing  4.  Pt will verbalize x3 strategies to manage anxiousness and fear of falling to reduce fall risk. Baseline: 11/15/23: Consistent cues required Eval: 0/3 strategies.  Goal status: Ongoing  5.  Pt will increase B lateral pinch strength by 5# to manage ADL items.  Baseline: Eval: Continue R 4#, L 5# Goal status: Ongoing  6. Pt. Will perform LE dressing with Supervision with A/E use. Baseline: Eval: MinA donning/doffing socks, ModA shoes Goal Status: Ongoing    ASSESSMENT:  CLINICAL IMPRESSION: Mother requested focus on bilat hand strength this date d/t reporting pt having difficulty grasping things with sufficient force and often  drops her phone.  Pt tolerated above noted activities well this date.  Pt was excited to report that she has now returned to school, but stated that she hasn't had to use the bathroom yet at school, as she was only there about 4 hours.  Pt. continues to benefit from OT services to improve BUE strength/dexterity and to provide ADL training in order to maximize indep with daily tasks.   PERFORMANCE DEFICITS: in functional skills including ADLs, coordination, strength, pain, and  balance, cognitive skills including attention and problem solving, and psychosocial skills including coping strategies and environmental adaptation.   IMPAIRMENTS: are limiting patient from ADLs and leisure.   COMORBIDITIES: has co-morbidities such as Prader-Willi syndrome, intellectual delay that affect occupational performance. Patient will benefit from skilled OT to address above impairments and improve overall function.  MODIFICATION OR ASSISTANCE TO COMPLETE EVALUATION: Min-Moderate modification of tasks or assist with assess necessary to complete an evaluation.  OT OCCUPATIONAL PROFILE AND HISTORY: Detailed assessment: Review of records and additional review of physical, cognitive, psychosocial history related to current functional performance.  CLINICAL DECISION MAKING: Moderate - several treatment options, min-mod task modification necessary  REHAB POTENTIAL: Good  EVALUATION COMPLEXITY: Moderate      PLAN:  OT FREQUENCY: 1-2x/week  OT DURATION: 12 weeks  PLANNED INTERVENTIONS: 97535 self care/ADL training, 19147 therapeutic exercise, 97530 therapeutic activity, patient/family education, and DME and/or AE instructions  RECOMMENDED OTHER SERVICES: PT  CONSULTED AND AGREED WITH PLAN OF CARE: Patient and family member/caregiver  PLAN FOR NEXT SESSION: ADLs  Marcus Sewer, MS, OTR/L  11/25/2023, 4:01 PM

## 2023-11-30 ENCOUNTER — Ambulatory Visit: Payer: MEDICAID

## 2023-11-30 NOTE — Therapy (Incomplete)
 OUTPATIENT PHYSICAL THERAPY LE TREATMENT   Patient Name: Linda Mason MRN: [id number] DOB:[birth year], 28 y.o., female Today's Date: 11/30/2023  END OF SESSION:        Past Medical History:  Diagnosis Date   Diabetes mellitus without complication [facility])    Past Surgical History:  Procedure Laterality Date   KNEE ARTHROSCOPY Right 08/27/2023   Procedure: ARTHROSCOPY KNEE;  Surgeon: [name], MD;  Location: [facility] ORS;  Service: Orthopedics;  Laterality: Right;   Patient Active Problem List   Diagnosis Date Noted   Closed patellar dislocation, right, initial encounter 09/01/2023   Patellar dislocation 08/26/2023   Type 2 diabetes mellitus without complication, with long-term current use of insulin  (HCC) 08/26/2023   Hypoxia 08/26/2023   Morbid obesity (HCC) 10/09/2021   Diabetes mellitus with neuropathy (HCC) 10/09/2021   Essential hypertension 10/09/2021   Thrombocytopenia (HCC) 10/09/2021   Hypothyroidism 10/09/2021   Prader-Willi syndrome 10/09/2021   Cellulitis 10/08/2021     [facility]P: [name]  REFERRING PROVIDER: [name], PA-C   REFERRING DIAG:  Diagnosis  S83.004A (ICD-10-CM) - Closed patellar dislocation, right, initial encounter    Rationale for Evaluation and Treatment: Rehabilitation  THERAPY DIAG:  No diagnosis found.  ONSET DATE: 08/26/2023   SUBJECTIVE:                                                                                                                                                                                           SUBJECTIVE STATEMENT  *** Arrived late to PT treatment due to traffic and weather issues. No other updates medically, but was able to attend Prom dance last week. Had a good time.     From Eval: The pt is a pleasant 28 yo female. She presents in [facility] with her mom and an interpreter present for PT eval. Pt suffered a R knee dislocation following a fall 08/26/2023. Pt also found to  have lateral femoral condyle impacting fx and medial patella impaction fracture of RLE. She underwent arthroscopic assisted R patellar reduction with lateral release on 08/27/2023. Pt admitted to CIR from 09/02/23 - 09/15/23/ Pt just had follow-up with surgeon and has been instructed to wean from hinged knee brace/has been released from care; she is WBAT. Pt's mother reports pt using 2WW to ambulate for short distances: maybe ambulating 30-50 steps at most without brace. Pt reports ambulation is limited by R knee pain. Her knee hurts most when walking. She has difficulty completing STS but reports she can do this on her own, although not able to do this yet in her school's bathroom and would like to work on  this. Pt was independent with ADLs prior to injury. Her mother is helping her now with dressing. She has not yet used steps to enter her home, is currently using her WC on ramp. They report no other falls. Pt wants to improve ability to ambulate for her school prom. She likes to watch movies.  Pt has Prader-Willi syndrome, intellectual delay.   PERTINENT HISTORY:   PMH also significant for DM II with neuropathy, HTN, thrombocytopenia, hypothyroidism, Prader-Willi syndrome.    PAIN:  Are you having pain? None currently, takes medicine for her knee pain; no othe rpain  PRECAUTIONS:  Knee - WBAT  RED FLAGS: None   WEIGHT BEARING RESTRICTIONS:  Via surgeon note "1. I recommended the patient be weightbearing as tolerated and wean from the hinged knee brace."  FALLS:  Has patient fallen in last 6 months? No  LIVING ENVIRONMENT: Lives with: lives with their family in one level home Stairs: 2 steps, handrails bilat to enter home, but also has two ramps that she uses to enter home and is not currently using steps Has following equipment at home: RW  OCCUPATION:  Student   PLOF:  Independent  PATIENT GOALS:  Pt wants to walk to be able to go to school prom; she wants to be able to  dance.  NEXT MD VISIT: Had follow-up today,  was released from care but can follow-up as needed.    OBJECTIVE:  Note: Objective measures were completed at Evaluation unless otherwise noted.  DIAGNOSTIC FINDINGS:  Via chart, note [name], MD "R Knee radiographs:  10/18/2023: 2 views (wheelchair bound lateral and oblique radiographs with suboptimal positioning) were obtained. Appropriate images cannot be obtained due to patient's hesitation/unwillingness to transfer to x-ray table. Within these limitations, there does not appear to be a recurrent patellar dislocation. There is significant osteopenia. Impaction fracture about the lateral femoral condyle is also visualized. It does not appear to fully involve the articular weightbearing surface. "  PATIENT SURVEYS:  LEFS deferred  COGNITIVE STATUS: Impaired at baseline   SENSATION: WFL BLE  COORDINATION: WFL rapid alt LE, unable to do heel>shin likely limited by body habitus and some hesitance/faer of movement  EDEMA:  Pt R knee still with slight edema around patella, no redness present, scars healing well  POSTURE:  rounded shoulders, increased fwd posture/kyphosis possibly impacted by body habitus    GAIT: Distance walked: (see below) Assistive device utilized: Environmental consultant - 2 wheeled Level of assistance: CGA Comments: Decreased gait speed, antalgic gait, decreased weightbearing and stance time on RLE, heavy BUE weightbearing through RW    Body Part #1 Knee  PALPATION: No pain with palpation superior, inferior, LTL patella, posterior knee RLE  LOWER EXTREMITY ROM:    Need to complete formal assessment, however pt exhibited to have impaired R hip flexion mobility with MMT, possibly due positioning but needs further assessment.    LOWER EXTREMITY MMT:    MMT Right eval Left eval  Hip flexion 3- 3+  Hip extension    Hip abduction 4 4  Hip adduction 3* 4  Hip internal rotation    Hip external rotation     Knee flexion * 4  Knee extension 3* 4+  Ankle dorsiflexion 4 4  Ankle plantarflexion    Ankle inversion    Ankle eversion     (Blank rows = not tested) "*" indicates pain-limited. Pt also might be self-limiting due to fear of pain with movement     FUNCTIONAL TESTS:  5 times sit to stand: 42.5 sec heavy use of UE to RW  6 minute walk test: deferred tp future visit 10 meter walk test: 0.16 sec with BRW  Berg Balance Scale: deferred to future visit                                                                                                                        TREATMENT DATE:  PT instructed pt in goal assessment for progress note.   10 Meter Walk Test: Patient instructed to walk 10 meters (32.8 ft) as quickly and as safely as possible at their normal speed x2 and at a fast speed x2. Time measured from 2 meter mark to 8 meter mark to accommodate ramp-up and ramp-down.   Average Normal speed: 0.59m/s with RW   Cut off scores: <0.4 m/s = household Ambulator, 0.4-0.8 m/s = limited community Ambulator, >0.8 m/s = community Ambulator, >1.2 m/s = crossing a street, <1.0 = increased fall risk MCID 0.05 m/s (small), 0.13 m/s (moderate), 0.06 m/s (significant)  (ANPTA Core Set of Outcome Measures for Adults with Neurologic Conditions, 2018)  6 Min Walk Test:  Instructed patient to ambulate as quickly and as safely as possible for 6 minutes using LRAD. Patient was allowed to take standing rest breaks without stopping the test, but if the patient required a sitting rest break the clock would be stopped and the test would be over.  Results: 350 feet using a RW with supervision assist and mild antalgic gait pattern. Results indicate that the patient has reduced endurance with ambulation compared to age matched norms.  Age Matched Norms: 66-28 yo M: 21 F: 68, 93-79 yo M: 4 F: 471, 33-89 yo M: 417 F: 392 MDC: 58.21 meters (190.98 feet) or 50 meters (ANPTA Core Set of Outcome Measures for  Adults with Neurologic Conditions, 2018)  Patient demonstrates increased fall risk as noted by score of   33/56 on Berg Balance Scale.  (<36= high risk for falls, close to 100%; 37-45 significant >80%; 46-51 moderate >50%; 52-55 lower >25%)     *Spanish interpreter present for patient's mother during visit today.    PATIENT EDUCATION:  Education details: Pt educated throughout session about proper posture and technique with exercises. Improved exercise technique, movement at target joints, use of target muscles after min to mod verbal, visual, tactile cues.  Person educated: Patient and Parent Education method: Explanation, Demonstration, Tactile cues, and Verbal cues Education comprehension: verbalized understanding, returned demonstration, verbal cues required, tactile cues required, and needs further education  HOME EXERCISE PROGRAM: Reviewed: safe technique, signs and symptoms to watch for that proceeding is OK, stop if feeling increased pain/any sharp pain in knee Access Code: [email or web address] URL: [email or web address] Date: 10/18/2023 Prepared by: Aminta Kales  Exercises - Sit to Stand with Counter Support  - 1 x daily - 7 x weekly - 2 sets - 5 reps    ASSESSMENT:  CLINICAL IMPRESSION: Continued plan as laid out in recent sessions  Instructed pt in progress not assessment. Improved function and increased safety noted by increased 5x STS to <15 sec, but UE support  required. Increased distance on walk test. Pt also able to complete Berg balance scale with improved tolerance to WB on the affected LE. Pt highly motivated throughout session. She was able to complete multiple laps of ambulation with RW without difficulty and even increased gait speed with cuing.  Continued working on LTL weight shifting and increasing RLE weightbearing. Pt still requires some UE support when having to rely on RLE for FWD step, but does exhibit some improvement with standing balance and  even addition of some dynamic activities. Patient's condition has the potential to improve in response to therapy. Maximum improvement is yet to be obtained. The anticipated improvement is attainable and reasonable in a generally predictable time.  Pt will continue to benefit from skilled physical therapy intervention to address impairments, improve QOL, and attain therapy goals.       OBJECTIVE IMPAIRMENTS: Abnormal gait, decreased activity tolerance, decreased balance, decreased coordination, decreased endurance, decreased mobility, difficulty walking, decreased ROM, decreased strength, increased edema, impaired flexibility, improper body mechanics, postural dysfunction, obesity, and pain.   ACTIVITY LIMITATIONS: carrying, lifting, bending, standing, squatting, stairs, transfers, toileting, dressing, and locomotion level  PARTICIPATION LIMITATIONS: meal prep, cleaning, laundry, shopping, community activity, yard work, and school  PERSONAL FACTORS: Fitness, Sex, and 3+ comorbidities: PMH significant for DM II with neuropathy, HTN, thrombocytopenia, hypothyroidism, Prader-Willi syndrome,  are also affecting patient's functional outcome.   REHAB POTENTIAL: Good  CLINICAL DECISION MAKING: Evolving/moderate complexity  EVALUATION COMPLEXITY: Moderate   GOALS: Goals reviewed with patient? Yes  SHORT TERM GOALS: Target date: 11/29/2023  Patient will be independent in home exercise program to improve strength/mobility for better functional independence with ADLs. Baseline: initiated Goal status: INITIAL    LONG TERM GOALS: Target date: 01/10/2024  Patient will increase lower extremity functional scale to >60/80 to demonstrate improved functional mobility and increased tolerance with ADLs. .  Baseline: deferred Goal status: INITIAL  2.  Patient (> 58 years old) will complete five times sit to stand test in < 15 seconds indicating an increased LE strength and improved balance. Baseline:  42.5 sec, heavy use of BUE 5/21: 14.27 sec.  Heavy use of BUE on RW  Goal status: IN PROGRESS  3.  Patient will increase Berg Balance score by > 6 points to demonstrate decreased fall risk during functional activities Baseline: deferred to future session, may need to complete different balance test if pt has difficulty with cuing for test activities  5/21: 33/56 Goal status: IN PROGRESS  4.  Patient will increase 10 meter walk test to >1.61m/s as to improve gait speed for better community ambulation and to reduce fall risk. Baseline: 0.16 with BRW 5/21: 0.83m/s with RW  Goal status: IN PROGRESS  5.  Patient will increase six minute walk test distance to >1000 for progression to community ambulator and improve gait ability. Baseline:  280 ft with RW 5/21: 349ft with RW  Goal status: IN PROGRESS    PLAN:  PT FREQUENCY: 1-2x/week  PT DURATION: 12 weeks  PLANNED INTERVENTIONS: 97164- PT Re-evaluation, 97750- Physical Performance Testing, 97110-Therapeutic exercises, 97530- Therapeutic activity, [id number]- Neuromuscular re-education, 97535- Self Care, 62952- Manual therapy, [id number]- Gait training, [phone]- Orthotic Initial, [phone]- Orthotic/Prosthetic subsequent, [phone]- Canalith repositioning, [id number]- Splinting, [id number]- Electrical stimulation (unattended), [phone]- Electrical stimulation (manual), [id number]- Ultrasound, Patient/Family education, Balance training, Stair training, Taping, Joint mobilization, Joint manipulation, Spinal mobilization, Scar  mobilization, Vestibular training, DME instructions, Wheelchair mobility training, Cryotherapy, and Moist heat.  PLAN FOR NEXT SESSION:  - Continue with  RLE muscle strengthening -Continue with more weight bearing activities for RLE with decreased UE support- Consider using blaze pods Continue with overall static and dynamic balance - add to HEP - Test gait speed with RW to challenge patient to decrease UE support   Murlene Army PT  Physical  Therapist- [facility]   8:09 AM 11/30/23

## 2023-12-02 ENCOUNTER — Ambulatory Visit: Payer: MEDICAID | Admitting: Occupational Therapy

## 2023-12-02 ENCOUNTER — Ambulatory Visit: Payer: MEDICAID

## 2023-12-02 DIAGNOSIS — M6281 Muscle weakness (generalized): Secondary | ICD-10-CM

## 2023-12-02 DIAGNOSIS — S83004A Unspecified dislocation of right patella, initial encounter: Secondary | ICD-10-CM

## 2023-12-02 DIAGNOSIS — R262 Difficulty in walking, not elsewhere classified: Secondary | ICD-10-CM

## 2023-12-02 DIAGNOSIS — R278 Other lack of coordination: Secondary | ICD-10-CM

## 2023-12-02 DIAGNOSIS — R2681 Unsteadiness on feet: Secondary | ICD-10-CM

## 2023-12-02 DIAGNOSIS — G8929 Other chronic pain: Secondary | ICD-10-CM

## 2023-12-02 NOTE — Therapy (Signed)
 Occupational Therapy Ortho Treatment Note  Patient Name: Linda Mason MRN: [id number] DOB:11/08/1995, 28 y.o., female Today's Date: 12/02/2023  PCP: [facility], [facility] REFERRING PROVIDER: Georjean Kite  END OF SESSION:  OT End of Session - 12/02/23 1729     Visit Number 12    Number of Visits 24    Date for OT Re-Evaluation 01/06/24    OT Start Time 1445    OT Stop Time 1530    OT Time Calculation (min) 45 min    Activity Tolerance Patient tolerated treatment well    Behavior During Therapy [facility] for tasks assessed/performed            Past Medical History:  Diagnosis Date   Diabetes mellitus without complication (HCC)    Past Surgical History:  Procedure Laterality Date   KNEE ARTHROSCOPY Right 08/27/2023   Procedure: ARTHROSCOPY KNEE;  Surgeon: [name], MD;  Location: [facility] ORS;  Service: Orthopedics;  Laterality: Right;   Patient Active Problem List   Diagnosis Date Noted   Closed patellar dislocation, right, initial encounter 09/01/2023   Patellar dislocation 08/26/2023   Type 2 diabetes mellitus without complication, with long-term current use of insulin  (HCC) 08/26/2023   Hypoxia 08/26/2023   Morbid obesity (HCC) 10/09/2021   Diabetes mellitus with neuropathy (HCC) 10/09/2021   Essential hypertension 10/09/2021   Thrombocytopenia (HCC) 10/09/2021   Hypothyroidism 10/09/2021   Prader-Willi syndrome 10/09/2021   Cellulitis 10/08/2021   ONSET DATE: 08/26/2023  REFERRING DIAG: Right knee patella fracture  THERAPY DIAG:  Muscle weakness (generalized)  Rationale for Evaluation and Treatment: Rehabilitation  SUBJECTIVE:   SUBJECTIVE STATEMENT: Pt reports she needs to have afternoon appointments because she has returned to school.  Pt accompanied by: family member and interpreter: Camila - Spanish interpreter  PERTINENT HISTORY: Linda Mason is a 28 year old right-handed morbidly obese female and BMI 44.88 as well as  diabetes mellitus, Prader-Willi syndrome, intellectual delay. Presented to [facility] ED on 08/26/2023 after mechanical fall getting out of her bed. X-rays and imaging revealed right knee irreducible patellar dislocation right knee lateral femoral condyle impacting fracture as well as right knee medial patella impaction fracture. Pt underwent right knee arthroscopic assisted patellar reduction with lateral release on 08/27/2023 per Dr. [name].  Weightbearing as tolerated right lower extremity knee immobilizer when out of bed or when walking. Pt admitted to CIR from 09/02/23 - 09/15/23  PRECAUTIONS: Knee , Knee Immobilizer    WEIGHT BEARING RESTRICTIONS: Yes WBAT  PAIN:  Are you having pain? Yes: NPRS scale: 0/10 Pain location: knee  FALLS: Has patient fallen in last 6 months? No  LIVING ENVIRONMENT: Lives with: lives with their family Lives in: House/apartment Stairs: No ramp Has following equipment at home: Wheelchair (manual)  PLOF: Independent with basic ADLs  PATIENT GOALS: to return to school and go to [employer] with no fear of falling   NEXT MD VISIT: none  OBJECTIVE:  Note: Objective measures were completed at Evaluation unless otherwise noted.  HAND DOMINANCE: Left  ADLs: Transfers/ambulation related to ADLs: SUPERVISION short distances using RW, MOD I using w/c Eating: IND Grooming: IND Upper body dressing: SETUP assist Lower body dressing: MAX A Toileting: SUPERVISION Bathing: MOD A bathing   Tub shower transfers: SUPERVISION Equipment: Shower seat with back, Walk in shower, and bed side commode  UPPER EXTREMITY ROM:     Active ROM Right  WFL Left  WFL    UPPER EXTREMITY MMT:   Question accuracy given difficulty following  commands   MMT Right eval Left eval  Shoulder flexion 3+/5 3+/5  Shoulder abduction    Elbow flexion 3+/5 3+/5  Elbow extension 3+/5 3+/5  Wrist flexion    Wrist extension    (Blank rows = not tested)  HAND FUNCTION: Grip strength:  Right: 10 lbs; Left: 15 lbs, Lateral pinch: Right: 4 lbs, Left: 5 lbs, and 3 point pinch: Right: 1 lbs, Left: 1 lbs  COORDINATION: 9 Hole Peg test: Right: 40 sec; Left: 30 sec  SENSATION: Light touch: Impaired  tingling  EDEMA: none  COGNITION: Overall cognitive status: History of cognitive impairments - at baseline Areas of impairment: Areas of impairment: Problem solving  Commands: Follows one step commands with increased time Behavior: Perseveration and anxious  OBSERVATIONS: Pt appears anxious and looking toward her mother for answers to questions.    TREATMENT DATE: 12/02/23  Therapeutic Exercise:  -Performed BUE strengthening using a 2# dowel ex. 2/2 to weakness. Bilateral shoulder flexion, chest press, circular patterns, and elbow flexion/extension for 1 set 10-20 reps each. -Performed BUE strengthening using 2# hand weight for elbow flexion, and extension, forearm supination/pronation, and wrist flexion/extension. -Pt. Worked on BUE strengthening, and reciprocal motion using the UBE while seated for 8 min. with moderate resistance. Constant monitoring was provided.      PATIENT EDUCATION: Education details: BUE strengthening Person educated: Counselling psychologist, pt Education method: Medical illustrator Education comprehension: verbalized understanding, mother receptive  HOME EXERCISE PROGRAM:  Theraputty  GOALS: Goals reviewed with patient? Yes    SHORT TERM GOALS: Target date: 11/24/23  Pt will INDEPENDENTLY complete HEP  Baseline: 11/15/2023: Supervision, cues 10/14/23: no HEP issued Goal status: INITIAL  LONG TERM GOALS: Target date: 01/06/24  Pt will increase B strength by 15# to assist with gripping grab bar for safe transfers   Baseline: 11/15/23: Pt. Is able to grip  the grab bar. 10/14/23: L 15#, R 10#  Goal status:  progressing, ongoing  2.  Pt will INDEPENDENTLY transfer to regular height toilet  Baseline: 11/15/23: Modified Independent toileting at home,  Supervision using the hospital clinic bathroom Eval: SUPERVISION + RW for elevated toilet, MIN A + RW for regular toilet Goal status: progressing, ongoing 3.  Pt will improve B FMC by decreasing 9 hole PEG test score by 10 seconds for participation in leisure activities (coloring).  Baseline: 11/15/23:Continue Eval: L 30", R 40" Goal status: Ongoing  4.  Pt will verbalize x3 strategies to manage anxiousness and fear of falling to reduce fall risk. Baseline: 11/15/23: Consistent cues required Eval: 0/3 strategies.  Goal status: Ongoing  5.  Pt will increase B lateral pinch strength by 5# to manage ADL items.  Baseline: Eval: Continue R 4#, L 5# Goal status: Ongoing  6. Pt. Will perform LE dressing with Supervision with A/E use. Baseline: Eval: MinA donning/doffing socks, ModA shoes Goal Status: Ongoing    ASSESSMENT:  CLINICAL IMPRESSION:  Pt. reports having had  a recent ER visit 2/2 passing out. Pt. reports that she went to visit friends, fell asleep in the car ride home, and did not wake up. Pt reports receiving IV fluids, and having an appointment set-up for further cardiac testing. Pt. requested to work towards improving BUE strengthening. Pt. Requires consistent cues for pace, an form. Pt. continues to work on improving, and maximizing independence with ADLs, and IADLs. Pt. continues to benefit from OT services to improve BUE strength/dexterity and to provide ADL training in order to maximize indep with daily tasks.  PERFORMANCE DEFICITS: in functional skills including ADLs, coordination, strength, pain, and balance, cognitive skills including attention and problem solving, and psychosocial skills including coping strategies and environmental adaptation.   IMPAIRMENTS: are limiting patient from ADLs and leisure.   COMORBIDITIES: has co-morbidities such as Prader-Willi syndrome, intellectual delay that affect occupational performance. Patient will benefit from skilled OT to address  above impairments and improve overall function.  MODIFICATION OR ASSISTANCE TO COMPLETE EVALUATION: Min-Moderate modification of tasks or assist with assess necessary to complete an evaluation.  OT OCCUPATIONAL PROFILE AND HISTORY: Detailed assessment: Review of records and additional review of physical, cognitive, psychosocial history related to current functional performance.  CLINICAL DECISION MAKING: Moderate - several treatment options, min-mod task modification necessary  REHAB POTENTIAL: Good  EVALUATION COMPLEXITY: Moderate      PLAN:  OT FREQUENCY: 1-2x/week  OT DURATION: 12 weeks  PLANNED INTERVENTIONS: 97535 self care/ADL training, 09811 therapeutic exercise, 97530 therapeutic activity, patient/family education, and DME and/or AE instructions  RECOMMENDED OTHER SERVICES: PT  CONSULTED AND AGREED WITH PLAN OF CARE: Patient and family member/caregiver  PLAN FOR NEXT SESSION: ADLs  Duey Ghent, MS, OTR/L   12/02/2023, 5:32 PM

## 2023-12-02 NOTE — Therapy (Signed)
 OUTPATIENT PHYSICAL THERAPY LE TREATMENT   Patient Name: Linda Mason MRN: [id number] DOB:[birth year], 28 y.o., female Today's Date: 12/03/2023  END OF SESSION:  PT End of Session - 12/02/23 1406     Visit Number 11    Number of Visits 25    Date for PT Re-Evaluation 01/10/24    Progress Note Due on Visit 20    PT Start Time 1400    PT Stop Time 1443    PT Time Calculation (min) 43 min    Equipment Utilized During Treatment Gait belt    Activity Tolerance Patient tolerated treatment well    Behavior During Therapy WFL for tasks assessed/performed                  Past Medical History:  Diagnosis Date   Diabetes mellitus without complication (HCC)    Past Surgical History:  Procedure Laterality Date   KNEE ARTHROSCOPY Right 08/27/2023   Procedure: ARTHROSCOPY KNEE;  Surgeon: [name], MD;  Location: [facility] ORS;  Service: Orthopedics;  Laterality: Right;   Patient Active Problem List   Diagnosis Date Noted   Closed patellar dislocation, right, initial encounter 09/01/2023   Patellar dislocation 08/26/2023   Type 2 diabetes mellitus without complication, with long-term current use of insulin  (HCC) 08/26/2023   Hypoxia 08/26/2023   Morbid obesity (HCC) 10/09/2021   Diabetes mellitus with neuropathy (HCC) 10/09/2021   Essential hypertension 10/09/2021   Thrombocytopenia (HCC) 10/09/2021   Hypothyroidism 10/09/2021   Prader-Willi syndrome 10/09/2021   Cellulitis 10/08/2021     [facility]: [name]  REFERRING PROVIDER: [name], PA-C   REFERRING DIAG:  Diagnosis  S83.004A (ICD-10-CM) - Closed patellar dislocation, right, initial encounter    Rationale for Evaluation and Treatment: Rehabilitation  THERAPY DIAG:  Muscle weakness (generalized)  Difficulty in walking, not elsewhere classified  Unsteadiness on feet  Chronic pain of right knee  Other lack of coordination  Closed patellar dislocation, right, initial  encounter  ONSET DATE: 08/26/2023   SUBJECTIVE:                                                                                                                                                                                           SUBJECTIVE STATEMENT  Patient reports feeling better. She reported going to ED on Tues evening due to difficulty waking after going out with family and fell asleep on ride home- very difficult to arouse ... Family was thinking maybe seizure and went to ED - States no findings and released home.   PT asked Mother if anything specific at home she feels like she needs  to work on:  Steps Taking a shower - be able to stand up on her own and bathe herself. Sqattting to pick up objects from the floor.   From Eval: The pt is a pleasant 28 yo female. She presents in [facility] with her mom and an interpreter present for PT eval. Pt suffered a R knee dislocation following a fall 08/26/2023. Pt also found to have lateral femoral condyle impacting fx and medial patella impaction fracture of RLE. She underwent arthroscopic assisted R patellar reduction with lateral release on 08/27/2023. Pt admitted to CIR from 09/02/23 - 09/15/23/ Pt just had follow-up with surgeon and has been instructed to wean from hinged knee brace/has been released from care; she is WBAT. Pt's mother reports pt using 2WW to ambulate for short distances: maybe ambulating 30-50 steps at most without brace. Pt reports ambulation is limited by R knee pain. Her knee hurts most when walking. She has difficulty completing STS but reports she can do this on her own, although not able to do this yet in her school's bathroom and would like to work on this. Pt was independent with ADLs prior to injury. Her mother is helping her now with dressing. She has not yet used steps to enter her home, is currently using her WC on ramp. They report no other falls. Pt wants to improve ability to ambulate for her school prom. She likes to watch  movies.  Pt has Prader-Willi syndrome, intellectual delay.   PERTINENT HISTORY:   PMH also significant for DM II with neuropathy, HTN, thrombocytopenia, hypothyroidism, Prader-Willi syndrome.    PAIN:  Are you having pain? None currently, takes medicine for her knee pain; no othe rpain  PRECAUTIONS:  Knee - WBAT  RED FLAGS: None   WEIGHT BEARING RESTRICTIONS:  Via surgeon note "1. I recommended the patient be weightbearing as tolerated and wean from the hinged knee brace."  FALLS:  Has patient fallen in last 6 months? No  LIVING ENVIRONMENT: Lives with: lives with their family in one level home Stairs: 2 steps, handrails bilat to enter home, but also has two ramps that she uses to enter home and is not currently using steps Has following equipment at home: RW  OCCUPATION:  Student   PLOF:  Independent  PATIENT GOALS:  Pt wants to walk to be able to go to school prom; she wants to be able to dance.  NEXT MD VISIT: Had follow-up today,  was released from care but can follow-up as needed.    OBJECTIVE:  Note: Objective measures were completed at Evaluation unless otherwise noted.  DIAGNOSTIC FINDINGS:  Via chart, note [name], MD "R Knee radiographs:  10/18/2023: 2 views (wheelchair bound lateral and oblique radiographs with suboptimal positioning) were obtained. Appropriate images cannot be obtained due to patient's hesitation/unwillingness to transfer to x-ray table. Within these limitations, there does not appear to be a recurrent patellar dislocation. There is significant osteopenia. Impaction fracture about the lateral femoral condyle is also visualized. It does not appear to fully involve the articular weightbearing surface. "  PATIENT SURVEYS:  LEFS deferred  COGNITIVE STATUS: Impaired at baseline   SENSATION: WFL BLE  COORDINATION: WFL rapid alt LE, unable to do heel>shin likely limited by body habitus and some hesitance/faer of  movement  EDEMA:  Pt R knee still with slight edema around patella, no redness present, scars healing well  POSTURE:  rounded shoulders, increased fwd posture/kyphosis possibly impacted by body habitus    GAIT:  Distance walked: (see below) Assistive device utilized: Walker - 2 wheeled Level of assistance: CGA Comments: Decreased gait speed, antalgic gait, decreased weightbearing and stance time on RLE, heavy BUE weightbearing through RW    Body Part #1 Knee  PALPATION: No pain with palpation superior, inferior, LTL patella, posterior knee RLE  LOWER EXTREMITY ROM:    Need to complete formal assessment, however pt exhibited to have impaired R hip flexion mobility with MMT, possibly due positioning but needs further assessment.    LOWER EXTREMITY MMT:    MMT Right eval Left eval  Hip flexion 3- 3+  Hip extension    Hip abduction 4 4  Hip adduction 3* 4  Hip internal rotation    Hip external rotation    Knee flexion * 4  Knee extension 3* 4+  Ankle dorsiflexion 4 4  Ankle plantarflexion    Ankle inversion    Ankle eversion     (Blank rows = not tested) "*" indicates pain-limited. Pt also might be self-limiting due to fear of pain with movement     FUNCTIONAL TESTS:  5 times sit to stand: 42.5 sec heavy use of UE to RW  6 minute walk test: deferred tp future visit 10 meter walk test: 0.16 sec with BRW  Berg Balance Scale: deferred to future visit                                                                                                                        TREATMENT DATE:  PT instructed pt in goal assessment for progress note.   10 Meter Walk Test: Patient instructed to walk 10 meters (32.8 ft) as quickly and as safely as possible at their normal speed x2 and at a fast speed x2. Time measured from 2 meter mark to 8 meter mark to accommodate ramp-up and ramp-down.   Average Normal speed: 0.31 m/s with RW   Cut off scores: <0.4 m/s = household  Ambulator, 0.4-0.8 m/s = limited community Ambulator, >0.8 m/s = community Ambulator, >1.2 m/s = crossing a street, <1.0 = increased fall risk MCID 0.05 m/s (small), 0.13 m/s (moderate), 0.06 m/s (significant)  (ANPTA Core Set of Outcome Measures for Adults with Neurologic Conditions, 2018)  SIT TO STAND with pad under left LE - no UE support 2 sets of 10 reps   Static stand with dual task- placing magnet letters in alphabetical order x several min- focusing on LE weight bearing with no UE Support    (Bending over activity) Squatting next to support bar- to pick up cone from 6" step box with left UE then place back with right x 10 then progressed to using airex pad x 5 each UE   PATIENT EDUCATION:  Education details: Pt educated throughout session about proper posture and technique with exercises. Improved exercise technique, movement at target joints, use of target muscles after min to mod verbal, visual, tactile cues.  Person educated: Patient and Parent Education method: Explanation, Demonstration,  Tactile cues, and Verbal cues Education comprehension: verbalized understanding, returned demonstration, verbal cues required, tactile cues required, and needs further education  HOME EXERCISE PROGRAM: Reviewed: safe technique, signs and symptoms to watch for that proceeding is OK, stop if feeling increased pain/any sharp pain in knee Access Code: [email or web address] URL: [email or web address] Date: 10/18/2023 Prepared by: Aminta Kales  Exercises - Sit to Stand with Counter Support  - 1 x daily - 7 x weekly - 2 sets - 5 reps    ASSESSMENT:  CLINICAL IMPRESSION:  Patient presents with improving gait abilities including decreased UE Support and more reciprocal gait. She presents with improved confidence in RLE as seen by improved time with weight bearing and even able to sit to stand with emphasis on RLE today. Yet she still lacks ability to fully lighten up with UE Support while  walking and still briefly pauses walker motion due to increased UE support. She was retested with gait speed and able to demo more progress as seen by faster speed.  Pt will continue to benefit from skilled physical therapy intervention to address impairments, improve QOL, and attain therapy goals.       OBJECTIVE IMPAIRMENTS: Abnormal gait, decreased activity tolerance, decreased balance, decreased coordination, decreased endurance, decreased mobility, difficulty walking, decreased ROM, decreased strength, increased edema, impaired flexibility, improper body mechanics, postural dysfunction, obesity, and pain.   ACTIVITY LIMITATIONS: carrying, lifting, bending, standing, squatting, stairs, transfers, toileting, dressing, and locomotion level  PARTICIPATION LIMITATIONS: meal prep, cleaning, laundry, shopping, community activity, yard work, and school  PERSONAL FACTORS: Fitness, Sex, and 3+ comorbidities: PMH significant for DM II with neuropathy, HTN, thrombocytopenia, hypothyroidism, Prader-Willi syndrome,  are also affecting patient's functional outcome.   REHAB POTENTIAL: Good  CLINICAL DECISION MAKING: Evolving/moderate complexity  EVALUATION COMPLEXITY: Moderate   GOALS: Goals reviewed with patient? Yes  SHORT TERM GOALS: Target date: 11/29/2023  Patient will be independent in home exercise program to improve strength/mobility for better functional independence with ADLs. Baseline: initiated Goal status: INITIAL    LONG TERM GOALS: Target date: 01/10/2024  Patient will increase lower extremity functional scale to >60/80 to demonstrate improved functional mobility and increased tolerance with ADLs. .  Baseline: deferred Goal status: INITIAL  2.  Patient (> 23 years old) will complete five times sit to stand test in < 15 seconds indicating an increased LE strength and improved balance. Baseline: 42.5 sec, heavy use of BUE 5/21: 14.27 sec.  Heavy use of BUE on RW  Goal status:  IN PROGRESS  3.  Patient will increase Berg Balance score by > 6 points to demonstrate decreased fall risk during functional activities Baseline: deferred to future session, may need to complete different balance test if pt has difficulty with cuing for test activities  5/21: 33/56 Goal status: IN PROGRESS  4.  Patient will increase 10 meter walk test to >1.98m/s as to improve gait speed for better community ambulation and to reduce fall risk. Baseline: 0.16 with BRW 5/21: 0.29m/s with RW  12/02/2023= 0.31 m/s with RW Goal status: IN PROGRESS  5.  Patient will increase six minute walk test distance to >1000 for progression to community ambulator and improve gait ability. Baseline:  280 ft with RW 5/21: 364ft with RW  Goal status: IN PROGRESS    PLAN:  PT FREQUENCY: 1-2x/week  PT DURATION: 12 weeks  PLANNED INTERVENTIONS: [id number]- PT Re-evaluation, 97750- Physical Performance Testing, 97110-Therapeutic exercises, 97530- Therapeutic activity, [id number]- Neuromuscular re-education, 97535- Self Care, 54098- Manual therapy, [id number]- Gait training,  91478- Orthotic Initial, 29562- Orthotic/Prosthetic subsequent, [phone]- Canalith repositioning, [id number]- Splinting, [phone]- Electrical stimulation (unattended), [id number]- Electrical stimulation (manual), [id number]- Ultrasound, Patient/Family education, Balance training, Stair training, Taping, Joint mobilization, Joint manipulation, Spinal mobilization, Scar mobilization, Vestibular training, DME instructions, Wheelchair mobility training, Cryotherapy, and Moist heat.  PLAN FOR NEXT SESSION:  - Continue with  RLE muscle strengthening -Continue with more weight bearing activities for RLE with decreased UE support- Consider using blaze pods Continue with overall static and dynamic balance - add to HEP    Murlene Army PT  Physical Therapist- [facility]  [facility]   7:34 AM 12/03/23

## 2023-12-07 ENCOUNTER — Ambulatory Visit: Payer: MEDICAID | Attending: Physician Assistant

## 2023-12-07 ENCOUNTER — Ambulatory Visit: Payer: MEDICAID | Admitting: Occupational Therapy

## 2023-12-07 DIAGNOSIS — G8929 Other chronic pain: Secondary | ICD-10-CM | POA: Diagnosis present

## 2023-12-07 DIAGNOSIS — M6281 Muscle weakness (generalized): Secondary | ICD-10-CM

## 2023-12-07 DIAGNOSIS — R262 Difficulty in walking, not elsewhere classified: Secondary | ICD-10-CM | POA: Diagnosis present

## 2023-12-07 DIAGNOSIS — M25561 Pain in right knee: Secondary | ICD-10-CM | POA: Diagnosis present

## 2023-12-07 DIAGNOSIS — R278 Other lack of coordination: Secondary | ICD-10-CM | POA: Diagnosis present

## 2023-12-07 DIAGNOSIS — S83004A Unspecified dislocation of right patella, initial encounter: Secondary | ICD-10-CM | POA: Diagnosis present

## 2023-12-07 DIAGNOSIS — R2681 Unsteadiness on feet: Secondary | ICD-10-CM

## 2023-12-07 NOTE — Therapy (Signed)
 Occupational Therapy Ortho Treatment Note  Patient Name: [name] MRN: [id number] DOB:06/18/1996, 28 y.o., female Today's Date: 12/07/2023  PCP: [name] REFERRING PROVIDER: Georjean Kite  END OF SESSION:  OT End of Session - 12/07/23 1658     Visit Number 13    Date for OT Re-Evaluation 01/06/24    OT Start Time 1015    OT Stop Time 1100    OT Time Calculation (min) 45 min    Activity Tolerance Patient tolerated treatment well    Behavior During Therapy [facility] for tasks assessed/performed            Past Medical History:  Diagnosis Date   Diabetes mellitus without complication (HCC)    Past Surgical History:  Procedure Laterality Date   KNEE ARTHROSCOPY Right 08/27/2023   Procedure: ARTHROSCOPY KNEE;  Surgeon: [name], MD;  Location: [facility] ORS;  Service: Orthopedics;  Laterality: Right;   Patient Active Problem List   Diagnosis Date Noted   Closed patellar dislocation, right, initial encounter 09/01/2023   Patellar dislocation 08/26/2023   Type 2 diabetes mellitus without complication, with long-term current use of insulin  (HCC) 08/26/2023   Hypoxia 08/26/2023   Morbid obesity (HCC) 10/09/2021   Diabetes mellitus with neuropathy (HCC) 10/09/2021   Essential hypertension 10/09/2021   Thrombocytopenia (HCC) 10/09/2021   Hypothyroidism 10/09/2021   Prader-Willi syndrome 10/09/2021   Cellulitis 10/08/2021   ONSET DATE: 08/26/2023  REFERRING DIAG: Right knee patella fracture  THERAPY DIAG:  Muscle weakness (generalized)  Rationale for Evaluation and Treatment: Rehabilitation  SUBJECTIVE:   SUBJECTIVE STATEMENT:  Pt. reports planning a trip to [place] this summer.  Pt accompanied by: family member and interpreter: Camila - Spanish interpreter  PERTINENT HISTORY: Linda Mason is a 28 year old right-handed morbidly obese female and BMI 44.88 as well as diabetes mellitus, Prader-Willi syndrome, intellectual delay.  Presented to [facility] ED on 08/26/2023 after mechanical fall getting out of her bed. X-rays and imaging revealed right knee irreducible patellar dislocation right knee lateral femoral condyle impacting fracture as well as right knee medial patella impaction fracture. Pt underwent right knee arthroscopic assisted patellar reduction with lateral release on 08/27/2023 per Dr. [name].  Weightbearing as tolerated right lower extremity knee immobilizer when out of bed or when walking. Pt admitted to CIR from 09/02/23 - 09/15/23  PRECAUTIONS: Knee , Knee Immobilizer    WEIGHT BEARING RESTRICTIONS: Yes WBAT  PAIN:  Are you having pain? Yes: NPRS scale: 0/10 Pain location: knee  FALLS: Has patient fallen in last 6 months? No  LIVING ENVIRONMENT: Lives with: lives with their family Lives in: House/apartment Stairs: No ramp Has following equipment at home: Wheelchair (manual)  PLOF: Independent with basic ADLs  PATIENT GOALS: to return to school and go to [employer] with no fear of falling   NEXT MD VISIT: none  OBJECTIVE:  Note: Objective measures were completed at Evaluation unless otherwise noted.  HAND DOMINANCE: Left  ADLs: Transfers/ambulation related to ADLs: SUPERVISION short distances using RW, MOD I using w/c Eating: IND Grooming: IND Upper body dressing: SETUP assist Lower body dressing: MAX A Toileting: SUPERVISION Bathing: MOD A bathing   Tub shower transfers: SUPERVISION Equipment: Shower seat with back, Walk in shower, and bed side commode  UPPER EXTREMITY ROM:     Active ROM Right  WFL Left  WFL    UPPER EXTREMITY MMT:   Question accuracy given difficulty following commands   MMT Right eval Left eval  Shoulder flexion  3+/5 3+/5  Shoulder abduction    Elbow flexion 3+/5 3+/5  Elbow extension 3+/5 3+/5  Wrist flexion    Wrist extension    (Blank rows = not tested)  HAND FUNCTION: Grip strength: Right: 10 lbs; Left: 15 lbs, Lateral pinch: Right: 4 lbs, Left: 5  lbs, and 3 point pinch: Right: 1 lbs, Left: 1 lbs  COORDINATION: 9 Hole Peg test: Right: 40 sec; Left: 30 sec  SENSATION: Light touch: Impaired  tingling  EDEMA: none  COGNITION: Overall cognitive status: History of cognitive impairments - at baseline Areas of impairment: Areas of impairment: Problem solving  Commands: Follows one step commands with increased time Behavior: Perseveration and anxious  OBSERVATIONS: Pt appears anxious and looking toward her mother for answers to questions.    TREATMENT DATE: 12/02/23  Therapeutic Exercise:  -Performed BUE strengthening using a 3# dowel ex. 2/2 to weakness. Bilateral shoulder flexion, chest press, circular patterns, and elbow flexion/extension for 1 set 10-20 reps each. -Performed BUE strengthening using 2# hand weight for elbow flexion, and extension, forearm supination/pronation, and wrist flexion/extension. -Lateral, and 3pt. Pinch strengthening using yellow, red, green, and blue, and black level resistive clips   Therapeutic Activities:  -Bilateral Progressive gross grip strengthening with 11.2# of grip strength resistive force.   -Incorporated reaching in multiple planes to further challenge the task.      PATIENT EDUCATION: Education details: BUE strengthening Person educated: Counselling psychologist, pt Education method: Medical illustrator Education comprehension: verbalized understanding, mother receptive  HOME EXERCISE PROGRAM:  Theraputty  GOALS: Goals reviewed with patient? Yes    SHORT TERM GOALS: Target date: 11/24/23  Pt will INDEPENDENTLY complete HEP  Baseline: 11/15/2023: Supervision, cues 10/14/23: no HEP issued Goal status: INITIAL  LONG TERM GOALS: Target date: 01/06/24  Pt will increase B strength by 15# to assist with gripping grab bar for safe transfers   Baseline: 11/15/23: Pt. Is able to grip  the grab bar. 10/14/23: L 15#, R 10#  Goal status:  progressing, ongoing  2.  Pt will INDEPENDENTLY transfer to  regular height toilet  Baseline: 11/15/23: Modified Independent toileting at home, Supervision using the hospital clinic bathroom Eval: SUPERVISION + RW for elevated toilet, MIN A + RW for regular toilet Goal status: progressing, ongoing 3.  Pt will improve B FMC by decreasing 9 hole PEG test score by 10 seconds for participation in leisure activities (coloring).  Baseline: 11/15/23:Continue Eval: L 30", R 40" Goal status: Ongoing  4.  Pt will verbalize x3 strategies to manage anxiousness and fear of falling to reduce fall risk. Baseline: 11/15/23: Consistent cues required Eval: 0/3 strategies.  Goal status: Ongoing  5.  Pt will increase B lateral pinch strength by 5# to manage ADL items.  Baseline: Eval: Continue R 4#, L 5# Goal status: Ongoing  6. Pt. Will perform LE dressing with Supervision with A/E use. Baseline: Eval: MinA donning/doffing socks, ModA shoes Goal Status: Ongoing    ASSESSMENT:  CLINICAL IMPRESSION:  Pt. reports having had no further episodes of passing out, or not waking up from sleeping. Pt./caregiver report the Pt. has follow up MD appointments, and testing within the next few weeks.  Pt. requested to work towards improving BUE strengthening. Pt. was able to upgrade to 3# dowel for UE strengthening. Pt. required frequent consistent cues, and assist to perform all strengthening tasks. Pt. requires consistent cues for pace, and form. Pt. continues to work on improving, and maximizing independence with ADLs, and IADLs. Pt. continues to benefit  from OT services to improve BUE strength/dexterity and to provide ADL training in order to maximize indep with daily tasks.   PERFORMANCE DEFICITS: in functional skills including ADLs, coordination, strength, pain, and balance, cognitive skills including attention and problem solving, and psychosocial skills including coping strategies and environmental adaptation.   IMPAIRMENTS: are limiting patient from ADLs and leisure.    COMORBIDITIES: has co-morbidities such as Prader-Willi syndrome, intellectual delay that affect occupational performance. Patient will benefit from skilled OT to address above impairments and improve overall function.  MODIFICATION OR ASSISTANCE TO COMPLETE EVALUATION: Min-Moderate modification of tasks or assist with assess necessary to complete an evaluation.  OT OCCUPATIONAL PROFILE AND HISTORY: Detailed assessment: Review of records and additional review of physical, cognitive, psychosocial history related to current functional performance.  CLINICAL DECISION MAKING: Moderate - several treatment options, min-mod task modification necessary  REHAB POTENTIAL: Good  EVALUATION COMPLEXITY: Moderate      PLAN:  OT FREQUENCY: 1-2x/week  OT DURATION: 12 weeks  PLANNED INTERVENTIONS: 97535 self care/ADL training, 40981 therapeutic exercise, 97530 therapeutic activity, patient/family education, and DME and/or AE instructions  RECOMMENDED OTHER SERVICES: PT  CONSULTED AND AGREED WITH PLAN OF CARE: Patient and family member/caregiver  PLAN FOR NEXT SESSION: ADLs  Duey Ghent, MS, OTR/L   12/07/2023, 5:10 PM

## 2023-12-07 NOTE — Therapy (Signed)
 OUTPATIENT PHYSICAL THERAPY LE TREATMENT   Patient Name: Linda Mason MRN: [id number] DOB:[birth year], 28 y.o., female Today's Date: 12/07/2023  END OF SESSION:  PT End of Session - 12/07/23 0920     Visit Number 12    Number of Visits 25    Date for PT Re-Evaluation 01/10/24    Progress Note Due on Visit 20    PT Start Time 0930    PT Stop Time 1015    PT Time Calculation (min) 45 min    Equipment Utilized During Treatment Gait belt    Activity Tolerance Patient tolerated treatment well    Behavior During Therapy WFL for tasks assessed/performed              Past Medical History:  Diagnosis Date   Diabetes mellitus without complication (HCC)    Past Surgical History:  Procedure Laterality Date   KNEE ARTHROSCOPY Right 08/27/2023   Procedure: ARTHROSCOPY KNEE;  Surgeon: [name], MD;  Location: [facility] ORS;  Service: Orthopedics;  Laterality: Right;   Patient Active Problem List   Diagnosis Date Noted   Closed patellar dislocation, right, initial encounter 09/01/2023   Patellar dislocation 08/26/2023   Type 2 diabetes mellitus without complication, with long-term current use of insulin  (HCC) 08/26/2023   Hypoxia 08/26/2023   Morbid obesity (HCC) 10/09/2021   Diabetes mellitus with neuropathy (HCC) 10/09/2021   Essential hypertension 10/09/2021   Thrombocytopenia (HCC) 10/09/2021   Hypothyroidism 10/09/2021   Prader-Willi syndrome 10/09/2021   Cellulitis 10/08/2021     PCP: [name]  REFERRING PROVIDER: [name], PA-C   REFERRING DIAG:  Diagnosis  S83.004A (ICD-10-CM) - Closed patellar dislocation, right, initial encounter    Rationale for Evaluation and Treatment: Rehabilitation  THERAPY DIAG:  Muscle weakness (generalized)  Difficulty in walking, not elsewhere classified  Unsteadiness on feet  Chronic pain of right knee  Other lack of coordination  Closed patellar dislocation, right, initial  encounter  ONSET DATE: 08/26/2023   SUBJECTIVE:                                                                                                                                                                                           SUBJECTIVE STATEMENT  Pt reports no seizures and no pain upon arrival to the clinic.  Pt still with bruising on the R arm from IV site at the hospital.   PT asked Mother if anything specific at home she feels like she needs to work on:  Steps Taking a shower - be able to stand up on her own and bathe herself. Sqattting to pick up objects  from the floor.   From Eval: The pt is a pleasant 28 yo female. She presents in [facility] with her mom and an interpreter present for PT eval. Pt suffered a R knee dislocation following a fall 08/26/2023. Pt also found to have lateral femoral condyle impacting fx and medial patella impaction fracture of RLE. She underwent arthroscopic assisted R patellar reduction with lateral release on 08/27/2023. Pt admitted to CIR from 09/02/23 - 09/15/23/ Pt just had follow-up with surgeon and has been instructed to wean from hinged knee brace/has been released from care; she is WBAT. Pt's mother reports pt using 2WW to ambulate for short distances: maybe ambulating 30-50 steps at most without brace. Pt reports ambulation is limited by R knee pain. Her knee hurts most when walking. She has difficulty completing STS but reports she can do this on her own, although not able to do this yet in her school's bathroom and would like to work on this. Pt was independent with ADLs prior to injury. Her mother is helping her now with dressing. She has not yet used steps to enter her home, is currently using her WC on ramp. They report no other falls. Pt wants to improve ability to ambulate for her school prom. She likes to watch movies.  Pt has Prader-Willi syndrome, intellectual delay.   PERTINENT HISTORY:   PMH also significant for DM II with neuropathy, HTN,  thrombocytopenia, hypothyroidism, Prader-Willi syndrome.    PAIN:  Are you having pain? None currently, takes medicine for her knee pain; no other pain  PRECAUTIONS:  Knee - WBAT  RED FLAGS: None   WEIGHT BEARING RESTRICTIONS:  Via surgeon note "1. I recommended the patient be weightbearing as tolerated and wean from the hinged knee brace."  FALLS:  Has patient fallen in last 6 months? No  LIVING ENVIRONMENT: Lives with: lives with their family in one level home Stairs: 2 steps, handrails bilat to enter home, but also has two ramps that she uses to enter home and is not currently using steps Has following equipment at home: RW  OCCUPATION:  Student   PLOF:  Independent  PATIENT GOALS:  Pt wants to walk to be able to go to school prom; she wants to be able to dance.  NEXT MD VISIT: Had follow-up today,  was released from care but can follow-up as needed.    OBJECTIVE:  Note: Objective measures were completed at Evaluation unless otherwise noted.  DIAGNOSTIC FINDINGS:  Via chart, note [name], MD "R Knee radiographs:  10/18/2023: 2 views (wheelchair bound lateral and oblique radiographs with suboptimal positioning) were obtained. Appropriate images cannot be obtained due to patient's hesitation/unwillingness to transfer to x-ray table. Within these limitations, there does not appear to be a recurrent patellar dislocation. There is significant osteopenia. Impaction fracture about the lateral femoral condyle is also visualized. It does not appear to fully involve the articular weightbearing surface. "  PATIENT SURVEYS:  LEFS deferred  COGNITIVE STATUS: Impaired at baseline   SENSATION: WFL BLE  COORDINATION: WFL rapid alt LE, unable to do heel>shin likely limited by body habitus and some hesitance/faer of movement  EDEMA:  Pt R knee still with slight edema around patella, no redness present, scars healing well  POSTURE:  rounded shoulders, increased  fwd posture/kyphosis possibly impacted by body habitus    GAIT: Distance walked: (see below) Assistive device utilized: Walker - 2 wheeled Level of assistance: CGA Comments: Decreased gait speed, antalgic gait, decreased weightbearing and  stance time on RLE, heavy BUE weightbearing through RW    Body Part #1 Knee  PALPATION: No pain with palpation superior, inferior, LTL patella, posterior knee RLE  LOWER EXTREMITY ROM:    Need to complete formal assessment, however pt exhibited to have impaired R hip flexion mobility with MMT, possibly due positioning but needs further assessment.    LOWER EXTREMITY MMT:    MMT Right eval Left eval  Hip flexion 3- 3+  Hip extension    Hip abduction 4 4  Hip adduction 3* 4  Hip internal rotation    Hip external rotation    Knee flexion * 4  Knee extension 3* 4+  Ankle dorsiflexion 4 4  Ankle plantarflexion    Ankle inversion    Ankle eversion     (Blank rows = not tested) "*" indicates pain-limited. Pt also might be self-limiting due to fear of pain with movement     FUNCTIONAL TESTS:  5 times sit to stand: 42.5 sec heavy use of UE to RW  6 minute walk test: deferred tp future visit 10 meter walk test: 0.16 sec with BRW  Berg Balance Scale: deferred to future visit                                                                                                                        TREATMENT DATE:    TA:  Ambulation around the hospital with use of the RW, down to [facility] and then outside through the healing garden.  CGA for all indoor/outdoor - 0 instances of instability when ambulating over pine needles - Focusing on overall endurance and to improve ambulation across differing terrain that pt would come in contact with in the community.  STS 10x with BUE push off arm-rest STS 10x with green pad under each LE 10x with BUE push off arm rest      TE:  Seated LAQ with 2#  alt LE 2 x10-12 -  difficult on RLE    Seated Hip flex 2#  2 x 10 bil - difficult on RLE  Seated hip adduction into rainbow physioball, 3 sec holds, 2x10 Seated hamstring curl BTB 2 x 10 reps      *Spanish interpreter present for patient's mother during visit today.    PATIENT EDUCATION:  Education details: Pt educated throughout session about proper posture and technique with exercises. Improved exercise technique, movement at target joints, use of target muscles after min to mod verbal, visual, tactile cues.  Person educated: Patient and Parent Education method: Explanation, Demonstration, Tactile cues, and Verbal cues Education comprehension: verbalized understanding, returned demonstration, verbal cues required, tactile cues required, and needs further education  HOME EXERCISE PROGRAM: Reviewed: safe technique, signs and symptoms to watch for that proceeding is OK, stop if feeling increased pain/any sharp pain in knee Access Code: [email or web address] URL: [email or web address] Date: 10/18/2023 Prepared by: Aminta Kales  Exercises - Sit to Stand with Counter Support  - 1 x daily -  7 x weekly - 2 sets - 5 reps    ASSESSMENT:  CLINICAL IMPRESSION:  Pt continues to improve with gait pattern and endurance with longer ambulation.  Pt did not fatigue with the indoor/outdoor walking, however pt tends to fatigue when taking a shower.  Pt may benefit from standing tolerance without UE support.  Pt otherwise is making good improvements and working towards meeting goals.   Pt will continue to benefit from skilled therapy to address remaining deficits in order to improve overall QoL and return to PLOF.       OBJECTIVE IMPAIRMENTS: Abnormal gait, decreased activity tolerance, decreased balance, decreased coordination, decreased endurance, decreased mobility, difficulty walking, decreased ROM, decreased strength, increased edema, impaired flexibility, improper body mechanics, postural dysfunction, obesity, and pain.    ACTIVITY LIMITATIONS: carrying, lifting, bending, standing, squatting, stairs, transfers, toileting, dressing, and locomotion level  PARTICIPATION LIMITATIONS: meal prep, cleaning, laundry, shopping, community activity, yard work, and school  PERSONAL FACTORS: Fitness, Sex, and 3+ comorbidities: PMH significant for DM II with neuropathy, HTN, thrombocytopenia, hypothyroidism, Prader-Willi syndrome,  are also affecting patient's functional outcome.   REHAB POTENTIAL: Good  CLINICAL DECISION MAKING: Evolving/moderate complexity  EVALUATION COMPLEXITY: Moderate   GOALS: Goals reviewed with patient? Yes  SHORT TERM GOALS: Target date: 11/29/2023  Patient will be independent in home exercise program to improve strength/mobility for better functional independence with ADLs. Baseline: initiated Goal status: INITIAL    LONG TERM GOALS: Target date: 01/10/2024  Patient will increase lower extremity functional scale to >60/80 to demonstrate improved functional mobility and increased tolerance with ADLs. .  Baseline: deferred Goal status: INITIAL  2.  Patient (> 84 years old) will complete five times sit to stand test in < 15 seconds indicating an increased LE strength and improved balance. Baseline: 42.5 sec, heavy use of BUE 5/21: 14.27 sec.  Heavy use of BUE on RW  Goal status: IN PROGRESS  3.  Patient will increase Berg Balance score by > 6 points to demonstrate decreased fall risk during functional activities Baseline: deferred to future session, may need to complete different balance test if pt has difficulty with cuing for test activities  5/21: 33/56 Goal status: IN PROGRESS  4.  Patient will increase 10 meter walk test to >1.29m/s as to improve gait speed for better community ambulation and to reduce fall risk. Baseline: 0.16 with BRW 5/21: 0.37m/s with RW  12/02/2023= 0.31 m/s with RW Goal status: IN PROGRESS  5.  Patient will increase six minute walk test distance to  >1000 for progression to community ambulator and improve gait ability. Baseline:  280 ft with RW 5/21: 349ft with RW  Goal status: IN PROGRESS    PLAN:  PT FREQUENCY: 1-2x/week  PT DURATION: 12 weeks  PLANNED INTERVENTIONS: 97164- PT Re-evaluation, 97750- Physical Performance Testing, 97110-Therapeutic exercises, 97530- Therapeutic activity, [id number]- Neuromuscular re-education, 97535- Self Care, 16109- Manual therapy, [phone]- Gait training, [phone]- Orthotic Initial, [phone]- Orthotic/Prosthetic subsequent, [phone]- Canalith repositioning, [id number]- Splinting, [id number]- Electrical stimulation (unattended), [phone]- Electrical stimulation (manual), [id number]- Ultrasound, Patient/Family education, Balance training, Stair training, Taping, Joint mobilization, Joint manipulation, Spinal mobilization, Scar mobilization, Vestibular training, DME instructions, Wheelchair mobility training, Cryotherapy, and Moist heat.  PLAN FOR NEXT SESSION:  - Continue with  RLE muscle strengthening - Continue with more weight bearing activities for RLE with decreased UE support- Consider using blaze pods Continue with overall static and dynamic balance - add to HEP    Rozanna Corner, PT, DPT Physical Therapist -   [employer]  12/07/23, 3:36 PM

## 2023-12-09 ENCOUNTER — Ambulatory Visit: Payer: MEDICAID | Admitting: Physical Therapy

## 2023-12-09 ENCOUNTER — Ambulatory Visit: Payer: MEDICAID | Admitting: Occupational Therapy

## 2023-12-09 DIAGNOSIS — R278 Other lack of coordination: Secondary | ICD-10-CM

## 2023-12-09 DIAGNOSIS — S83004A Unspecified dislocation of right patella, initial encounter: Secondary | ICD-10-CM

## 2023-12-09 DIAGNOSIS — R262 Difficulty in walking, not elsewhere classified: Secondary | ICD-10-CM

## 2023-12-09 DIAGNOSIS — M6281 Muscle weakness (generalized): Secondary | ICD-10-CM

## 2023-12-09 DIAGNOSIS — G8929 Other chronic pain: Secondary | ICD-10-CM

## 2023-12-09 DIAGNOSIS — R2681 Unsteadiness on feet: Secondary | ICD-10-CM

## 2023-12-09 NOTE — Therapy (Addendum)
 Occupational Therapy Ortho Treatment Note  Patient Name: Linda Mason MRN: [id number] DOB:1995-08-29, 28 y.o., female Today's Date: 12/09/2023  PCP: [name] REFERRING PROVIDER: Georjean Kite  END OF SESSION:  OT End of Session - 12/09/23 1226     Visit Number 14    Number of Visits 24    Date for OT Re-Evaluation 01/06/24    OT Start Time 0845    OT Stop Time 0930    OT Time Calculation (min) 45 min    Activity Tolerance Patient tolerated treatment well    Behavior During Therapy [facility] for tasks assessed/performed            Past Medical History:  Diagnosis Date   Diabetes mellitus without complication (HCC)    Past Surgical History:  Procedure Laterality Date   KNEE ARTHROSCOPY Right 08/27/2023   Procedure: ARTHROSCOPY KNEE;  Surgeon: [name], MD;  Location: [facility] ORS;  Service: Orthopedics;  Laterality: Right;   Patient Active Problem List   Diagnosis Date Noted   Closed patellar dislocation, right, initial encounter 09/01/2023   Patellar dislocation 08/26/2023   Type 2 diabetes mellitus without complication, with long-term current use of insulin  (HCC) 08/26/2023   Hypoxia 08/26/2023   Morbid obesity (HCC) 10/09/2021   Diabetes mellitus with neuropathy (HCC) 10/09/2021   Essential hypertension 10/09/2021   Thrombocytopenia (HCC) 10/09/2021   Hypothyroidism 10/09/2021   Prader-Willi syndrome 10/09/2021   Cellulitis 10/08/2021   ONSET DATE: 08/26/2023  REFERRING DIAG: Right knee patella fracture  THERAPY DIAG:  Muscle weakness (generalized)  Other lack of coordination  Rationale for Evaluation and Treatment: Rehabilitation  SUBJECTIVE:   SUBJECTIVE STATEMENT:  Pt. reports that her mother had to cook chicken this morning.   Pt accompanied by: Interpreter: Milly - Spanish interpreter  PERTINENT HISTORY: Linda Mason is a 28 year old right-handed morbidly obese female and BMI 44.88 as well as diabetes  mellitus, Prader-Willi syndrome, intellectual delay. Presented to [facility] ED on 08/26/2023 after mechanical fall getting out of her bed. X-rays and imaging revealed right knee irreducible patellar dislocation right knee lateral femoral condyle impacting fracture as well as right knee medial patella impaction fracture. Pt underwent right knee arthroscopic assisted patellar reduction with lateral release on 08/27/2023 per Dr. [name].  Weightbearing as tolerated right lower extremity knee immobilizer when out of bed or when walking. Pt admitted to CIR from 09/02/23 - 09/15/23  PRECAUTIONS: Knee , Knee Immobilizer    WEIGHT BEARING RESTRICTIONS: Yes WBAT  PAIN:  Are you having pain? Yes: NPRS scale: 0/10 Pain location: knee  FALLS: Has patient fallen in last 6 months? No  LIVING ENVIRONMENT: Lives with: lives with their family Lives in: House/apartment Stairs: No ramp Has following equipment at home: Wheelchair (manual)  PLOF: Independent with basic ADLs  PATIENT GOALS: to return to school and go to [employer] with no fear of falling   NEXT MD VISIT: none  OBJECTIVE:  Note: Objective measures were completed at Evaluation unless otherwise noted.  HAND DOMINANCE: Left  ADLs: Transfers/ambulation related to ADLs: SUPERVISION short distances using RW, MOD I using w/c Eating: IND Grooming: IND Upper body dressing: SETUP assist Lower body dressing: MAX A Toileting: SUPERVISION Bathing: MOD A bathing   Tub shower transfers: SUPERVISION Equipment: Shower seat with back, Walk in shower, and bed side commode  UPPER EXTREMITY ROM:     Active ROM Right  WFL Left  WFL    UPPER EXTREMITY MMT:   Question accuracy given difficulty  following commands   MMT Right eval Left eval  Shoulder flexion 3+/5 3+/5  Shoulder abduction    Elbow flexion 3+/5 3+/5  Elbow extension 3+/5 3+/5  Wrist flexion    Wrist extension    (Blank rows = not tested)  HAND FUNCTION: Grip strength: Right: 10  lbs; Left: 15 lbs, Lateral pinch: Right: 4 lbs, Left: 5 lbs, and 3 point pinch: Right: 1 lbs, Left: 1 lbs  COORDINATION: 9 Hole Peg test: Right: 40 sec; Left: 30 sec  SENSATION: Light touch: Impaired  tingling  EDEMA: none  COGNITION: Overall cognitive status: History of cognitive impairments - at baseline Areas of impairment: Areas of impairment: Problem solving  Commands: Follows one step commands with increased time Behavior: Perseveration and anxious  OBSERVATIONS: Pt appears anxious and looking toward her mother for answers to questions.    TREATMENT DATE: 12/09/23  Therapeutic Exercise:  -Performed BUE strengthening using a 3# dowel ex. 2/2 to weakness. Bilateral shoulder flexion, chest press, circular patterns, and elbow flexion/extension for 1 set 10-20 reps each. -Performed BUE strengthening using 2# hand weight for elbow flexion, and extension, forearm supination/pronation, and wrist flexion/extension. -Bilateral Progressive gross grip strengthening with  6.6# of grip strength resistive force.   Therapeutic Activities:  -Lateral, and 3pt. Pinch strengthening using yellow, red, green, and blue, and black level resistive clips  -strengthening was combined with functional reaching through multiple planes to place the clips.      PATIENT EDUCATION: Education details: BUE strengthening, funcyional reaching Person educated: Parent, pt Education method: Medical illustrator Education comprehension: verbalized understanding, mother receptive  HOME EXERCISE PROGRAM:  Theraputty  GOALS: Goals reviewed with patient? Yes    SHORT TERM GOALS: Target date: 11/24/23  Pt will INDEPENDENTLY complete HEP  Baseline: 11/15/2023: Supervision, cues 10/14/23: no HEP issued Goal status: INITIAL  LONG TERM GOALS: Target date: 01/06/24  Pt will increase B strength by 15# to assist with gripping grab bar for safe transfers   Baseline: 11/15/23: Pt. Is able to grip  the grab  bar. 10/14/23: L 15#, R 10#  Goal status:  progressing, ongoing  2.  Pt will INDEPENDENTLY transfer to regular height toilet  Baseline: 11/15/23: Modified Independent toileting at home, Supervision using the hospital clinic bathroom Eval: SUPERVISION + RW for elevated toilet, MIN A + RW for regular toilet Goal status: progressing, ongoing 3.  Pt will improve B FMC by decreasing 9 hole PEG test score by 10 seconds for participation in leisure activities (coloring).  Baseline: 11/15/23:Continue Eval: L 30", R 40" Goal status: Ongoing  4.  Pt will verbalize x3 strategies to manage anxiousness and fear of falling to reduce fall risk. Baseline: 11/15/23: Consistent cues required Eval: 0/3 strategies.  Goal status: Ongoing  5.  Pt will increase B lateral pinch strength by 5# to manage ADL items.  Baseline: Eval: Continue R 4#, L 5# Goal status: Ongoing  6. Pt. Will perform LE dressing with Supervision with A/E use. Baseline: Eval: MinA donning/doffing socks, ModA shoes Goal Status: Ongoing    ASSESSMENT:  CLINICAL IMPRESSION:  Pt.arrived to therapy alone with  an interpreter. Pt. reports having had no further episodes of passing out, or not waking up from sleeping. Pt. requested to work towards improving BUE strengthening. Pt. continues to 3# dowel for UE strengthening. Pt. required frequent consistent cues, and assist to perform all strengthening tasks. Pt. requires consistent cues for pace, and form. Pt. continues to work on improving, and maximizing independence with ADLs, and  IADLs. Pt. continues to benefit from OT services to improve BUE strength/dexterity and to provide ADL training in order to maximize indep with daily tasks.   PERFORMANCE DEFICITS: in functional skills including ADLs, coordination, strength, pain, and balance, cognitive skills including attention and problem solving, and psychosocial skills including coping strategies and environmental adaptation.   IMPAIRMENTS: are  limiting patient from ADLs and leisure.   COMORBIDITIES: has co-morbidities such as Prader-Willi syndrome, intellectual delay that affect occupational performance. Patient will benefit from skilled OT to address above impairments and improve overall function.  MODIFICATION OR ASSISTANCE TO COMPLETE EVALUATION: Min-Moderate modification of tasks or assist with assess necessary to complete an evaluation.  OT OCCUPATIONAL PROFILE AND HISTORY: Detailed assessment: Review of records and additional review of physical, cognitive, psychosocial history related to current functional performance.  CLINICAL DECISION MAKING: Moderate - several treatment options, min-mod task modification necessary  REHAB POTENTIAL: Good  EVALUATION COMPLEXITY: Moderate      PLAN:  OT FREQUENCY: 1-2x/week  OT DURATION: 12 weeks  PLANNED INTERVENTIONS: 97535 self care/ADL training, 16109 therapeutic exercise, 97530 therapeutic activity, patient/family education, and DME and/or AE instructions  RECOMMENDED OTHER SERVICES: PT  CONSULTED AND AGREED WITH PLAN OF CARE: Patient and family member/caregiver  PLAN FOR NEXT SESSION: ADLs  Duey Ghent, MS, OTR/L   12/09/2023, 12:32 PM

## 2023-12-09 NOTE — Therapy (Signed)
 OUTPATIENT PHYSICAL THERAPY LE TREATMENT   Patient Name: Linda Mason MRN: [id number] DOB:Aug 11, 1995, 28 y.o., female Today's Date: 12/09/2023  END OF SESSION:  PT End of Session - 12/09/23 0815     Visit Number 13    Number of Visits 25    Date for PT Re-Evaluation 01/10/24    Progress Note Due on Visit 20    PT Start Time 0816    PT Stop Time 0845    PT Time Calculation (min) 29 min    Equipment Utilized During Treatment Gait belt    Activity Tolerance Patient tolerated treatment well    Behavior During Therapy WFL for tasks assessed/performed              Past Medical History:  Diagnosis Date   Diabetes mellitus without complication (HCC)    Past Surgical History:  Procedure Laterality Date   KNEE ARTHROSCOPY Right 08/27/2023   Procedure: ARTHROSCOPY KNEE;  Surgeon: [name], MD;  Location: [facility] ORS;  Service: Orthopedics;  Laterality: Right;   Patient Active Problem List   Diagnosis Date Noted   Closed patellar dislocation, right, initial encounter 09/01/2023   Patellar dislocation 08/26/2023   Type 2 diabetes mellitus without complication, with long-term current use of insulin  (HCC) 08/26/2023   Hypoxia 08/26/2023   Morbid obesity (HCC) 10/09/2021   Diabetes mellitus with neuropathy (HCC) 10/09/2021   Essential hypertension 10/09/2021   Thrombocytopenia (HCC) 10/09/2021   Hypothyroidism 10/09/2021   Prader-Willi syndrome 10/09/2021   Cellulitis 10/08/2021     PCP: [name]  REFERRING PROVIDER: [name], PA-C   REFERRING DIAG:  Diagnosis  S83.004A (ICD-10-CM) - Closed patellar dislocation, right, initial encounter    Rationale for Evaluation and Treatment: Rehabilitation  THERAPY DIAG:  Muscle weakness (generalized)  Difficulty in walking, not elsewhere classified  Unsteadiness on feet  Chronic pain of right knee  Other lack of coordination  Closed patellar dislocation, right, initial  encounter  ONSET DATE: 08/26/2023   SUBJECTIVE:                                                                                                                                                                                           SUBJECTIVE STATEMENT  Pt arrived late to scheduled PT. Pt reports that she is doing well. Sister is graduating this afternoon at 5:00pm. Pt is very excited. Will be able to utilize ramp at school to go to  graduation.    Pt's mother not present, but interpreter remained throughout session at pt request.   Pt states that she had an episode of "passing out" yesterday in brother's  car. But question time line, as description matches account from hospital encounter.   From Eval: The pt is a pleasant 28 yo female. She presents in [facility] with her mom and an interpreter present for PT eval. Pt suffered a R knee dislocation following a fall 08/26/2023. Pt also found to have lateral femoral condyle impacting fx and medial patella impaction fracture of RLE. She underwent arthroscopic assisted R patellar reduction with lateral release on 08/27/2023. Pt admitted to CIR from 09/02/23 - 09/15/23/ Pt just had follow-up with surgeon and has been instructed to wean from hinged knee brace/has been released from care; she is WBAT. Pt's mother reports pt using 2WW to ambulate for short distances: maybe ambulating 30-50 steps at most without brace. Pt reports ambulation is limited by R knee pain. Her knee hurts most when walking. She has difficulty completing STS but reports she can do this on her own, although not able to do this yet in her school's bathroom and would like to work on this. Pt was independent with ADLs prior to injury. Her mother is helping her now with dressing. She has not yet used steps to enter her home, is currently using her WC on ramp. They report no other falls. Pt wants to improve ability to ambulate for her school prom. She likes to watch movies.  Pt has Prader-Willi  syndrome, intellectual delay.   PERTINENT HISTORY:   PMH also significant for DM II with neuropathy, HTN, thrombocytopenia, hypothyroidism, Prader-Willi syndrome.    PAIN:  Are you having pain? None currently, takes medicine for her knee pain; no other pain  PRECAUTIONS:  Knee - WBAT  RED FLAGS: None   WEIGHT BEARING RESTRICTIONS:  Via surgeon note "1. I recommended the patient be weightbearing as tolerated and wean from the hinged knee brace."  FALLS:  Has patient fallen in last 6 months? No  LIVING ENVIRONMENT: Lives with: lives with their family in one level home Stairs: 2 steps, handrails bilat to enter home, but also has two ramps that she uses to enter home and is not currently using steps Has following equipment at home: RW  OCCUPATION:  Student   PLOF:  Independent  PATIENT GOALS:  Pt wants to walk to be able to go to school prom; she wants to be able to dance.  NEXT MD VISIT: Had follow-up today,  was released from care but can follow-up as needed.    OBJECTIVE:  Note: Objective measures were completed at Evaluation unless otherwise noted.  DIAGNOSTIC FINDINGS:  Via chart, note [name], MD "R Knee radiographs:  10/18/2023: 2 views (wheelchair bound lateral and oblique radiographs with suboptimal positioning) were obtained. Appropriate images cannot be obtained due to patient's hesitation/unwillingness to transfer to x-ray table. Within these limitations, there does not appear to be a recurrent patellar dislocation. There is significant osteopenia. Impaction fracture about the lateral femoral condyle is also visualized. It does not appear to fully involve the articular weightbearing surface. "  PATIENT SURVEYS:  LEFS deferred  COGNITIVE STATUS: Impaired at baseline   SENSATION: WFL BLE  COORDINATION: WFL rapid alt LE, unable to do heel>shin likely limited by body habitus and some hesitance/faer of movement  EDEMA:  Pt R knee still with  slight edema around patella, no redness present, scars healing well  POSTURE:  rounded shoulders, increased fwd posture/kyphosis possibly impacted by body habitus    GAIT: Distance walked: (see below) Assistive device utilized: Walker - 2 wheeled Level of assistance: CGA  Comments: Decreased gait speed, antalgic gait, decreased weightbearing and stance time on RLE, heavy BUE weightbearing through RW    Body Part #1 Knee  PALPATION: No pain with palpation superior, inferior, LTL patella, posterior knee RLE  LOWER EXTREMITY ROM:    Need to complete formal assessment, however pt exhibited to have impaired R hip flexion mobility with MMT, possibly due positioning but needs further assessment.    LOWER EXTREMITY MMT:    MMT Right eval Left eval  Hip flexion 3- 3+  Hip extension    Hip abduction 4 4  Hip adduction 3* 4  Hip internal rotation    Hip external rotation    Knee flexion * 4  Knee extension 3* 4+  Ankle dorsiflexion 4 4  Ankle plantarflexion    Ankle inversion    Ankle eversion     (Blank rows = not tested) "*" indicates pain-limited. Pt also might be self-limiting due to fear of pain with movement     FUNCTIONAL TESTS:  5 times sit to stand: 42.5 sec heavy use of UE to RW  6 minute walk test: deferred tp future visit 10 meter walk test: 0.16 sec with BRW  Berg Balance Scale: deferred to future visit                                                                                                                        TREATMENT DATE:    TA:  Sit<>stand 2 x 6. Without UE support.  Reciprocal foot tap on 4 inch step with UE support on rail x 10 bil performed without UE support x 3.  Forward/reverse gait with RW x 4 for 75ft.   Gait training to improve gait pattern and improve symmetry of movement pattern. Forward/reverse gait without AD 5ft x 3. And HHA from PT.  Gait with HHA from PT through rehab gym x 37ft with cues for posture and improved step  symmetry to reduce antalgic gait pattern.  Dynamic gait training with HHA to step over 3 canes and weave around 3 cones, 12ft x 2 with min assist with difficulty noted with fatigue stepping over canes.   Gait training with RW to OT gym x 52ft with supervision assist and min cues for increased WB through the RLE.   PATIENT EDUCATION:  Education details: Pt educated throughout session about proper posture and technique with exercises. Improved exercise technique, movement at target joints, use of target muscles after min to mod verbal, visual, tactile cues.  Person educated: Patient and Parent Education method: Explanation, Demonstration, Tactile cues, and Verbal cues Education comprehension: verbalized understanding, returned demonstration, verbal cues required, tactile cues required, and needs further education  HOME EXERCISE PROGRAM: Reviewed: safe technique, signs and symptoms to watch for that proceeding is OK, stop if feeling increased pain/any sharp pain in knee Access Code: [email or web address] URL: [email or web address] Date: 10/18/2023 Prepared by: Aminta Kales  Exercises - Sit to Stand with Counter Support  - 1 x daily - 7  x weekly - 2 sets - 5 reps    ASSESSMENT:  CLINICAL IMPRESSION:  Pt continues to improve with gait pattern and endurance with longer ambulation without AD with dynamic component for obstacle navigation. Session slightly limited by late arrival. Continues to demonstrate increased WB tolerance through the RLE and reduced UE support on PT or through RW. Will continue to benefit gait with reduced UE Support. Pt will continue to benefit from skilled therapy to address remaining deficits in order to improve overall QoL and return to PLOF.       OBJECTIVE IMPAIRMENTS: Abnormal gait, decreased activity tolerance, decreased balance, decreased coordination, decreased endurance, decreased mobility, difficulty walking, decreased ROM, decreased strength, increased  edema, impaired flexibility, improper body mechanics, postural dysfunction, obesity, and pain.   ACTIVITY LIMITATIONS: carrying, lifting, bending, standing, squatting, stairs, transfers, toileting, dressing, and locomotion level  PARTICIPATION LIMITATIONS: meal prep, cleaning, laundry, shopping, community activity, yard work, and school  PERSONAL FACTORS: Fitness, Sex, and 3+ comorbidities: PMH significant for DM II with neuropathy, HTN, thrombocytopenia, hypothyroidism, Prader-Willi syndrome,  are also affecting patient's functional outcome.   REHAB POTENTIAL: Good  CLINICAL DECISION MAKING: Evolving/moderate complexity  EVALUATION COMPLEXITY: Moderate   GOALS: Goals reviewed with patient? Yes  SHORT TERM GOALS: Target date: 11/29/2023  Patient will be independent in home exercise program to improve strength/mobility for better functional independence with ADLs. Baseline: initiated Goal status: INITIAL    LONG TERM GOALS: Target date: 01/10/2024  Patient will increase lower extremity functional scale to >60/80 to demonstrate improved functional mobility and increased tolerance with ADLs. .  Baseline: deferred Goal status: INITIAL  2.  Patient (> 37 years old) will complete five times sit to stand test in < 15 seconds indicating an increased LE strength and improved balance. Baseline: 42.5 sec, heavy use of BUE 5/21: 14.27 sec.  Heavy use of BUE on RW  Goal status: IN PROGRESS  3.  Patient will increase Berg Balance score by > 6 points to demonstrate decreased fall risk during functional activities Baseline: deferred to future session, may need to complete different balance test if pt has difficulty with cuing for test activities  5/21: 33/56 Goal status: IN PROGRESS  4.  Patient will increase 10 meter walk test to >1.70m/s as to improve gait speed for better community ambulation and to reduce fall risk. Baseline: 0.16 with BRW 5/21: 0.13m/s with RW  12/02/2023= 0.31 m/s with  RW Goal status: IN PROGRESS  5.  Patient will increase six minute walk test distance to >1000 for progression to community ambulator and improve gait ability. Baseline:  280 ft with RW 5/21: 331ft with RW  Goal status: IN PROGRESS    PLAN:  PT FREQUENCY: 1-2x/week  PT DURATION: 12 weeks  PLANNED INTERVENTIONS: 97164- PT Re-evaluation, 97750- Physical Performance Testing, 97110-Therapeutic exercises, 97530- Therapeutic activity, [id number]- Neuromuscular re-education, 97535- Self Care, 16109- Manual therapy, [phone]- Gait training, [phone]- Orthotic Initial, [phone]- Orthotic/Prosthetic subsequent, [phone]- Canalith repositioning, [id number]- Splinting, [id number]- Electrical stimulation (unattended), [phone]- Electrical stimulation (manual), [id number]- Ultrasound, Patient/Family education, Balance training, Stair training, Taping, Joint mobilization, Joint manipulation, Spinal mobilization, Scar mobilization, Vestibular training, DME instructions, Wheelchair mobility training, Cryotherapy, and Moist heat.  PLAN FOR NEXT SESSION:  - Continue with  RLE muscle strengthening - Continue with more weight bearing activities for RLE with decreased UE support- Consider using blaze pods Continue with overall static and dynamic balance - add to HEP   Aurora Lees PT, DPT  Physical Therapist - [facility]  [facility] [place] [facility]  8:19 AM 12/09/23

## 2023-12-14 ENCOUNTER — Ambulatory Visit: Payer: MEDICAID | Admitting: Physical Therapy

## 2023-12-14 ENCOUNTER — Ambulatory Visit: Payer: MEDICAID | Admitting: Occupational Therapy

## 2023-12-14 DIAGNOSIS — M6281 Muscle weakness (generalized): Secondary | ICD-10-CM | POA: Diagnosis not present

## 2023-12-14 DIAGNOSIS — S83004A Unspecified dislocation of right patella, initial encounter: Secondary | ICD-10-CM

## 2023-12-14 DIAGNOSIS — R2681 Unsteadiness on feet: Secondary | ICD-10-CM

## 2023-12-14 DIAGNOSIS — R278 Other lack of coordination: Secondary | ICD-10-CM

## 2023-12-14 DIAGNOSIS — G8929 Other chronic pain: Secondary | ICD-10-CM

## 2023-12-14 DIAGNOSIS — R262 Difficulty in walking, not elsewhere classified: Secondary | ICD-10-CM

## 2023-12-14 NOTE — Therapy (Signed)
 OUTPATIENT PHYSICAL THERAPY LE TREATMENT   Patient Name: Ranae Casebier MRN: [id number] DOB:27-Aug-1995, 28 y.o., female Today's Date: 12/14/2023  END OF SESSION:  PT End of Session - 12/14/23 0938     Visit Number 14    Number of Visits 25    Date for PT Re-Evaluation 01/10/24    Progress Note Due on Visit 20    PT Start Time 0935    PT Stop Time 1015    PT Time Calculation (min) 40 min    Equipment Utilized During Treatment Gait belt    Activity Tolerance Patient tolerated treatment well    Behavior During Therapy WFL for tasks assessed/performed              Past Medical History:  Diagnosis Date   Diabetes mellitus without complication (HCC)    Past Surgical History:  Procedure Laterality Date   KNEE ARTHROSCOPY Right 08/27/2023   Procedure: ARTHROSCOPY KNEE;  Surgeon: [name], MD;  Location: [facility] ORS;  Service: Orthopedics;  Laterality: Right;   Patient Active Problem List   Diagnosis Date Noted   Closed patellar dislocation, right, initial encounter 09/01/2023   Patellar dislocation 08/26/2023   Type 2 diabetes mellitus without complication, with long-term current use of insulin  (HCC) 08/26/2023   Hypoxia 08/26/2023   Morbid obesity (HCC) 10/09/2021   Diabetes mellitus with neuropathy (HCC) 10/09/2021   Essential hypertension 10/09/2021   Thrombocytopenia (HCC) 10/09/2021   Hypothyroidism 10/09/2021   Prader-Willi syndrome 10/09/2021   Cellulitis 10/08/2021     [facility]P: [name]  REFERRING PROVIDER: [name], PA-C   REFERRING DIAG:  Diagnosis  S83.004A (ICD-10-CM) - Closed patellar dislocation, right, initial encounter    Rationale for Evaluation and Treatment: Rehabilitation  THERAPY DIAG:  Muscle weakness (generalized)  Difficulty in walking, not elsewhere classified  Unsteadiness on feet  Chronic pain of right knee  Other lack of coordination  Closed patellar dislocation, right, initial  encounter  ONSET DATE: 08/26/2023   SUBJECTIVE:                                                                                                                                                                                           SUBJECTIVE STATEMENT  Pt arrived late to scheduled PT. Pt reports that she is doing well. Sister is graduating this afternoon at 5:00pm. Pt is very excited. Will be able to utilize ramp at school to go to  graduation.    Pt's mother not present, but interpreter remained throughout session at pt request.   Pt states that she had an episode of "passing out" yesterday in brother's  car. But question time line, as description matches account from hospital encounter.   From Eval: The pt is a pleasant 28 yo female. She presents in [facility] with her mom and an interpreter present for PT eval. Pt suffered a R knee dislocation following a fall 08/26/2023. Pt also found to have lateral femoral condyle impacting fx and medial patella impaction fracture of RLE. She underwent arthroscopic assisted R patellar reduction with lateral release on 08/27/2023. Pt admitted to CIR from 09/02/23 - 09/15/23/ Pt just had follow-up with surgeon and has been instructed to wean from hinged knee brace/has been released from care; she is WBAT. Pt's mother reports pt using 2WW to ambulate for short distances: maybe ambulating 30-50 steps at most without brace. Pt reports ambulation is limited by R knee pain. Her knee hurts most when walking. She has difficulty completing STS but reports she can do this on her own, although not able to do this yet in her school's bathroom and would like to work on this. Pt was independent with ADLs prior to injury. Her mother is helping her now with dressing. She has not yet used steps to enter her home, is currently using her WC on ramp. They report no other falls. Pt wants to improve ability to ambulate for her school prom. She likes to watch movies.  Pt has Prader-Willi  syndrome, intellectual delay.   PERTINENT HISTORY:   PMH also significant for DM II with neuropathy, HTN, thrombocytopenia, hypothyroidism, Prader-Willi syndrome.    PAIN:  Are you having pain? None currently, takes medicine for her knee pain; no other pain  PRECAUTIONS:  Knee - WBAT  RED FLAGS: None   WEIGHT BEARING RESTRICTIONS:  Via surgeon note "1. I recommended the patient be weightbearing as tolerated and wean from the hinged knee brace."  FALLS:  Has patient fallen in last 6 months? No  LIVING ENVIRONMENT: Lives with: lives with their family in one level home Stairs: 2 steps, handrails bilat to enter home, but also has two ramps that she uses to enter home and is not currently using steps Has following equipment at home: RW  OCCUPATION:  Student   PLOF:  Independent  PATIENT GOALS:  Pt wants to walk to be able to go to school prom; she wants to be able to dance.  NEXT MD VISIT: Had follow-up today,  was released from care but can follow-up as needed.    OBJECTIVE:  Note: Objective measures were completed at Evaluation unless otherwise noted.  DIAGNOSTIC FINDINGS:  Via chart, note [name], MD "R Knee radiographs:  10/18/2023: 2 views (wheelchair bound lateral and oblique radiographs with suboptimal positioning) were obtained. Appropriate images cannot be obtained due to patient's hesitation/unwillingness to transfer to x-ray table. Within these limitations, there does not appear to be a recurrent patellar dislocation. There is significant osteopenia. Impaction fracture about the lateral femoral condyle is also visualized. It does not appear to fully involve the articular weightbearing surface. "  PATIENT SURVEYS:  LEFS deferred  COGNITIVE STATUS: Impaired at baseline   SENSATION: WFL BLE  COORDINATION: WFL rapid alt LE, unable to do heel>shin likely limited by body habitus and some hesitance/faer of movement  EDEMA:  Pt R knee still with  slight edema around patella, no redness present, scars healing well  POSTURE:  rounded shoulders, increased fwd posture/kyphosis possibly impacted by body habitus    GAIT: Distance walked: (see below) Assistive device utilized: Walker - 2 wheeled Level of assistance: CGA  Comments: Decreased gait speed, antalgic gait, decreased weightbearing and stance time on RLE, heavy BUE weightbearing through RW    Body Part #1 Knee  PALPATION: No pain with palpation superior, inferior, LTL patella, posterior knee RLE  LOWER EXTREMITY ROM:    Need to complete formal assessment, however pt exhibited to have impaired R hip flexion mobility with MMT, possibly due positioning but needs further assessment.    LOWER EXTREMITY MMT:    MMT Right eval Left eval  Hip flexion 3- 3+  Hip extension    Hip abduction 4 4  Hip adduction 3* 4  Hip internal rotation    Hip external rotation    Knee flexion * 4  Knee extension 3* 4+  Ankle dorsiflexion 4 4  Ankle plantarflexion    Ankle inversion    Ankle eversion     (Blank rows = not tested) "*" indicates pain-limited. Pt also might be self-limiting due to fear of pain with movement     FUNCTIONAL TESTS:  5 times sit to stand: 42.5 sec heavy use of UE to RW  6 minute walk test: deferred tp future visit 10 meter walk test: 0.16 sec with BRW  Berg Balance Scale: deferred to future visit                                                                                                                        TREATMENT DATE:   Pt ambulates into therapy gym without AD on this day. Mild antalgic gait pattern with mild GR on the R knee and decreased stance time   TE Seated therex for improved BLE strength and ROM:  LAQ 3# AW 2x 12  Hip flexion with 3# AW 2x 15  Hip abduction RTB 2x 12  Sit<>stand with ball press over head 2 x 6  Isometric hip adduction 2x 12   Gait: Gait without AD 2 x 52ft + 43ft, then 63ft+90ft with RW; CGA for safety  from PT with min cues for symmetry of step length with only slight improvement.   Forward.reverse gait with no AD 4 x 37ft.  Side stepping R and L 3 x 79ft bil cues for improved step length intermittently.  And improved posture to allow increase glute med activation.   PATIENT EDUCATION:  Education details: Pt educated throughout session about proper posture and technique with exercises. Improved exercise technique, movement at target joints, use of target muscles after min to mod verbal, visual, tactile cues.  Person educated: Patient and Parent Education method: Explanation, Demonstration, Tactile cues, and Verbal cues Education comprehension: verbalized understanding, returned demonstration, verbal cues required, tactile cues required, and needs further education  HOME EXERCISE PROGRAM: Reviewed: safe technique, signs and symptoms to watch for that proceeding is OK, stop if feeling increased pain/any sharp pain in knee Access Code: [email or web address] URL: [email or web address] Date: 10/18/2023 Prepared by: Aminta Kales  Exercises - Sit to Stand with Counter Support  - 1 x daily - 7 x weekly -  2 sets - 5 reps    ASSESSMENT:  CLINICAL IMPRESSION:  Pt continues to improve with gait pattern and endurance with longer ambulation without AD. Continues to demonstrate increased WB tolerance through the RLE and reduced UE support on PT or through RW. Also demonstrates improve function with increased tolerance to weighted strength training with increase repetitions and weight. Will continue to benefit gait with reduced UE Support. Pt will continue to benefit from skilled therapy to address remaining deficits in order to improve overall QoL and return to PLOF.       OBJECTIVE IMPAIRMENTS: Abnormal gait, decreased activity tolerance, decreased balance, decreased coordination, decreased endurance, decreased mobility, difficulty walking, decreased ROM, decreased strength, increased edema,  impaired flexibility, improper body mechanics, postural dysfunction, obesity, and pain.   ACTIVITY LIMITATIONS: carrying, lifting, bending, standing, squatting, stairs, transfers, toileting, dressing, and locomotion level  PARTICIPATION LIMITATIONS: meal prep, cleaning, laundry, shopping, community activity, yard work, and school  PERSONAL FACTORS: Fitness, Sex, and 3+ comorbidities: PMH significant for DM II with neuropathy, HTN, thrombocytopenia, hypothyroidism, Prader-Willi syndrome,  are also affecting patient's functional outcome.   REHAB POTENTIAL: Good  CLINICAL DECISION MAKING: Evolving/moderate complexity  EVALUATION COMPLEXITY: Moderate   GOALS: Goals reviewed with patient? Yes  SHORT TERM GOALS: Target date: 11/29/2023  Patient will be independent in home exercise program to improve strength/mobility for better functional independence with ADLs. Baseline: initiated Goal status: INITIAL    LONG TERM GOALS: Target date: 01/10/2024  Patient will increase lower extremity functional scale to >60/80 to demonstrate improved functional mobility and increased tolerance with ADLs. .  Baseline: deferred Goal status: INITIAL  2.  Patient (> 28 years old) will complete five times sit to stand test in < 15 seconds indicating an increased LE strength and improved balance. Baseline: 42.5 sec, heavy use of BUE 5/21: 14.27 sec.  Heavy use of BUE on RW  Goal status: IN PROGRESS  3.  Patient will increase Berg Balance score by > 6 points to demonstrate decreased fall risk during functional activities Baseline: deferred to future session, may need to complete different balance test if pt has difficulty with cuing for test activities  5/21: 33/56 Goal status: IN PROGRESS  4.  Patient will increase 10 meter walk test to >1.18m/s as to improve gait speed for better community ambulation and to reduce fall risk. Baseline: 0.16 with BRW 5/21: 0.59m/s with RW  12/02/2023= 0.31 m/s with  RW Goal status: IN PROGRESS  5.  Patient will increase six minute walk test distance to >1000 for progression to community ambulator and improve gait ability. Baseline:  280 ft with RW 5/21: 33ft with RW  Goal status: IN PROGRESS    PLAN:  PT FREQUENCY: 1-2x/week  PT DURATION: 12 weeks  PLANNED INTERVENTIONS: 97164- PT Re-evaluation, 97750- Physical Performance Testing, 97110-Therapeutic exercises, 97530- Therapeutic activity, [id number]- Neuromuscular re-education, 97535- Self Care, [id number]- Manual therapy, [phone]- Gait training, [phone]- Orthotic Initial, [phone]- Orthotic/Prosthetic subsequent, [phone]- Canalith repositioning, [id number]- Splinting, [id number]- Electrical stimulation (unattended), [phone]- Electrical stimulation (manual), [id number]- Ultrasound, Patient/Family education, Balance training, Stair training, Taping, Joint mobilization, Joint manipulation, Spinal mobilization, Scar mobilization, Vestibular training, DME instructions, Wheelchair mobility training, Cryotherapy, and Moist heat.  PLAN FOR NEXT SESSION:  - Continue with  RLE muscle strengthening - Continue with more weight bearing activities for RLE with decreased UE support- Consider using blaze pods Continue with overall static and dynamic balance - add to HEP   Aurora Lees PT, DPT  Physical Therapist - [place]  [facility]  9:38 AM 12/14/23

## 2023-12-14 NOTE — Therapy (Signed)
 Occupational Therapy Ortho Treatment Note  Patient Name: Linda Mason MRN: [id number] DOB:1995-09-14, 28 y.o., female Today's Date: 12/14/2023  [name] REFERRING PROVIDER: Georjean Kite  END OF SESSION:  OT End of Session - 12/14/23 1214     Visit Number 15    Number of Visits 24    Date for OT Re-Evaluation 01/06/24    OT Start Time 1015    OT Stop Time 1100    OT Time Calculation (min) 45 min    Activity Tolerance Patient tolerated treatment well    Behavior During Therapy [facility] for tasks assessed/performed            Past Medical History:  Diagnosis Date   Diabetes mellitus without complication (HCC)    Past Surgical History:  Procedure Laterality Date   KNEE ARTHROSCOPY Right 08/27/2023   Procedure: ARTHROSCOPY KNEE;  Surgeon: [name], MD;  Location: [facility] ORS;  Service: Orthopedics;  Laterality: Right;   Patient Active Problem List   Diagnosis Date Noted   Closed patellar dislocation, right, initial encounter 09/01/2023   Patellar dislocation 08/26/2023   Type 2 diabetes mellitus without complication, with long-term current use of insulin  (HCC) 08/26/2023   Hypoxia 08/26/2023   Morbid obesity (HCC) 10/09/2021   Diabetes mellitus with neuropathy (HCC) 10/09/2021   Essential hypertension 10/09/2021   Thrombocytopenia (HCC) 10/09/2021   Hypothyroidism 10/09/2021   Prader-Willi syndrome 10/09/2021   Cellulitis 10/08/2021   ONSET DATE: 08/26/2023  REFERRING DIAG: Right knee patella fracture  THERAPY DIAG:  Muscle weakness (generalized)  Rationale for Evaluation and Treatment: Rehabilitation  SUBJECTIVE:   SUBJECTIVE STATEMENT:  Pt. reports feeling good take.   Pt accompanied by: Interpreter: Milly - Spanish interpreter  PERTINENT HISTORY: ANAYELLI LAI is a 28 year old right-handed morbidly obese female and BMI 44.88 as well as diabetes mellitus, Prader-Willi syndrome, intellectual delay. Presented to  [facility] ED on 08/26/2023 after mechanical fall getting out of her bed. X-rays and imaging revealed right knee irreducible patellar dislocation right knee lateral femoral condyle impacting fracture as well as right knee medial patella impaction fracture. Pt underwent right knee arthroscopic assisted patellar reduction with lateral release on 08/27/2023 per Dr. [name].  Weightbearing as tolerated right lower extremity knee immobilizer when out of bed or when walking. Pt admitted to CIR from 09/02/23 - 09/15/23  PRECAUTIONS: Knee , Knee Immobilizer    WEIGHT BEARING RESTRICTIONS: Yes WBAT  PAIN:  Are you having pain? Yes: NPRS scale: 0/10 Pain location: knee  FALLS: Has patient fallen in last 6 months? No  LIVING ENVIRONMENT: Lives with: lives with their family Lives in: House/apartment Stairs: No ramp Has following equipment at home: Wheelchair (manual)  PLOF: Independent with basic ADLs  PATIENT GOALS: to return to school and go to [employer] with no fear of falling   NEXT MD VISIT: none  OBJECTIVE:  Note: Objective measures were completed at Evaluation unless otherwise noted.  HAND DOMINANCE: Left  ADLs: Transfers/ambulation related to ADLs: SUPERVISION short distances using RW, MOD I using w/c Eating: IND Grooming: IND Upper body dressing: SETUP assist Lower body dressing: MAX A Toileting: SUPERVISION Bathing: MOD A bathing   Tub shower transfers: SUPERVISION Equipment: Shower seat with back, Walk in shower, and bed side commode  UPPER EXTREMITY ROM:     Active ROM Right  WFL Left  WFL    UPPER EXTREMITY MMT:   Question accuracy given difficulty following commands   MMT Right eval Left eval  Shoulder  flexion 3+/5 3+/5  Shoulder abduction    Elbow flexion 3+/5 3+/5  Elbow extension 3+/5 3+/5  Wrist flexion    Wrist extension    (Blank rows = not tested)  HAND FUNCTION: Grip strength: Right: 10 lbs; Left: 15 lbs, Lateral pinch: Right: 4 lbs, Left: 5 lbs, and 3  point pinch: Right: 1 lbs, Left: 1 lbs  COORDINATION: 9 Hole Peg test: Right: 40 sec; Left: 30 sec  SENSATION: Light touch: Impaired  tingling  EDEMA: none  COGNITION: Overall cognitive status: History of cognitive impairments - at baseline Areas of impairment: Areas of impairment: Problem solving  Commands: Follows one step commands with increased time Behavior: Perseveration and anxious  OBSERVATIONS: Pt appears anxious and looking toward her mother for answers to questions.    TREATMENT DATE: 12/09/23  Therapeutic Exercise:  -Performed BUE strengthening using a 3# dowel ex. 2/2 to weakness. Bilateral shoulder flexion, chest press, circular patterns, and elbow flexion/extension for 1 set 10-20 reps each. -Performed BUE strengthening using 2# hand weight for elbow flexion, and extension, forearm supination/pronation, and wrist flexion/extension. -Progressive gross grip strengthening with 6.6# of grip strength resistive force.  Therapeutic Activities:  -Lateral, and 3pt. Pinch strengthening using yellow, red, green, and blue, and black level resistive clips  -strengthening was combined with functional reaching through multiple planes to place the clips.  PATIENT EDUCATION: Education details: BUE strengthening, funcyional reaching Person educated: Parent, pt Education method: Medical illustrator Education comprehension: verbalized understanding, mother receptive  HOME EXERCISE PROGRAM:  Theraputty  GOALS: Goals reviewed with patient? Yes    SHORT TERM GOALS: Target date: 11/24/23  Pt will INDEPENDENTLY complete HEP  Baseline: 11/15/2023: Supervision, cues 10/14/23: no HEP issued Goal status: INITIAL  LONG TERM GOALS: Target date: 01/06/24  Pt will increase B strength by 15# to assist with gripping grab bar for safe transfers   Baseline: 11/15/23: Pt. Is able to grip  the grab bar. 10/14/23: L 15#, R 10#  Goal status:  progressing, ongoing  2.  Pt will  INDEPENDENTLY transfer to regular height toilet  Baseline: 11/15/23: Modified Independent toileting at home, Supervision using the hospital clinic bathroom Eval: SUPERVISION + RW for elevated toilet, MIN A + RW for regular toilet Goal status: progressing, ongoing 3.  Pt will improve B FMC by decreasing 9 hole PEG test score by 10 seconds for participation in leisure activities (coloring).  Baseline: 11/15/23:Continue Eval: L 30", R 40" Goal status: Ongoing  4.  Pt will verbalize x3 strategies to manage anxiousness and fear of falling to reduce fall risk. Baseline: 11/15/23: Consistent cues required Eval: 0/3 strategies.  Goal status: Ongoing  5.  Pt will increase B lateral pinch strength by 5# to manage ADL items.  Baseline: Eval: Continue R 4#, L 5# Goal status: Ongoing  6. Pt. Will perform LE dressing with Supervision with A/E use. Baseline: Eval: MinA donning/doffing socks, ModA shoes Goal Status: Ongoing    ASSESSMENT:  CLINICAL IMPRESSION:  Pt. was present with her mother, sister, and an interpreter today. Pt. reports having had no further episodes of passing out, or not waking up from sleeping. Pt. requested to work towards improving BUE strengthening. Pt. is tolerating 3# dowel UE strengthening exercises. Pt. required frequent consistent cues, and assist to perform all strengthening tasks. Pt. requires consistent cues for pace, and form. Pt. continues to work on improving, and maximizing independence with ADLs, and IADLs. Pt. continues to benefit from OT services to improve BUE strength/dexterity and to provide  ADL training in order to maximize indep with daily tasks.   PERFORMANCE DEFICITS: in functional skills including ADLs, coordination, strength, pain, and balance, cognitive skills including attention and problem solving, and psychosocial skills including coping strategies and environmental adaptation.   IMPAIRMENTS: are limiting patient from ADLs and leisure.   COMORBIDITIES:  has co-morbidities such as Prader-Willi syndrome, intellectual delay that affect occupational performance. Patient will benefit from skilled OT to address above impairments and improve overall function.  MODIFICATION OR ASSISTANCE TO COMPLETE EVALUATION: Min-Moderate modification of tasks or assist with assess necessary to complete an evaluation.  OT OCCUPATIONAL PROFILE AND HISTORY: Detailed assessment: Review of records and additional review of physical, cognitive, psychosocial history related to current functional performance.  CLINICAL DECISION MAKING: Moderate - several treatment options, min-mod task modification necessary  REHAB POTENTIAL: Good  EVALUATION COMPLEXITY: Moderate      PLAN:  OT FREQUENCY: 1-2x/week  OT DURATION: 12 weeks  PLANNED INTERVENTIONS: 97535 self care/ADL training, 96295 therapeutic exercise, 97530 therapeutic activity, patient/family education, and DME and/or AE instructions  RECOMMENDED OTHER SERVICES: PT  CONSULTED AND AGREED WITH PLAN OF CARE: Patient and family member/caregiver  PLAN FOR NEXT SESSION: ADLs  Duey Ghent, MS, OTR/L   12/14/2023, 12:17 PM

## 2023-12-16 ENCOUNTER — Ambulatory Visit: Payer: MEDICAID | Admitting: Physical Therapy

## 2023-12-16 ENCOUNTER — Ambulatory Visit: Payer: MEDICAID | Admitting: Occupational Therapy

## 2023-12-16 DIAGNOSIS — R262 Difficulty in walking, not elsewhere classified: Secondary | ICD-10-CM

## 2023-12-16 DIAGNOSIS — R278 Other lack of coordination: Secondary | ICD-10-CM

## 2023-12-16 DIAGNOSIS — R2681 Unsteadiness on feet: Secondary | ICD-10-CM

## 2023-12-16 DIAGNOSIS — S83004A Unspecified dislocation of right patella, initial encounter: Secondary | ICD-10-CM

## 2023-12-16 DIAGNOSIS — M6281 Muscle weakness (generalized): Secondary | ICD-10-CM | POA: Diagnosis not present

## 2023-12-16 DIAGNOSIS — G8929 Other chronic pain: Secondary | ICD-10-CM

## 2023-12-16 NOTE — Therapy (Addendum)
 Occupational Therapy Ortho Treatment Note  Patient Name: Linda Mason MRN: [id number] DOB:[birth year], 28 y.o., female Today's Date: 12/16/2023  PCP: [name] REFERRING PROVIDER: Georjean Kite  END OF SESSION:  OT End of Session - 12/16/23 1721     Visit Number 16    Number of Visits 24    Date for OT Re-Evaluation 01/06/24    OT Start Time 1015    OT Stop Time 1100    OT Time Calculation (min) 45 min    Equipment Utilized During Treatment RW    Activity Tolerance Patient tolerated treatment well    Behavior During Therapy [facility] for tasks assessed/performed         Past Medical History:  Diagnosis Date   Diabetes mellitus without complication [facility])    Past Surgical History:  Procedure Laterality Date   KNEE ARTHROSCOPY Right 08/27/2023   Procedure: ARTHROSCOPY KNEE;  Surgeon: [name], MD;  Location: [facility] ORS;  Service: Orthopedics;  Laterality: Right;   Patient Active Problem List   Diagnosis Date Noted   Closed patellar dislocation, right, initial encounter 09/01/2023   Patellar dislocation 08/26/2023   Type 2 diabetes mellitus without complication, with long-term current use of insulin  (HCC) 08/26/2023   Hypoxia 08/26/2023   Morbid obesity (HCC) 10/09/2021   Diabetes mellitus with neuropathy (HCC) 10/09/2021   Essential hypertension 10/09/2021   Thrombocytopenia (HCC) 10/09/2021   Hypothyroidism 10/09/2021   Prader-Willi syndrome 10/09/2021   Cellulitis 10/08/2021   ONSET DATE: 08/26/2023  REFERRING DIAG: Right knee patella fracture  THERAPY DIAG:  Muscle weakness (generalized)  Other lack of coordination  Rationale for Evaluation and Treatment: Rehabilitation  SUBJECTIVE:   SUBJECTIVE STATEMENT:  Pt. reports being excited about spending the night at her friend's house in [place], [place] this weekend.   Pt accompanied by: Interpreter:  Spanish speaking interpreter  PERTINENT HISTORY: MILAN CLARE is a  28 year old right-handed morbidly obese female and BMI 44.88 as well as diabetes mellitus, Prader-Willi syndrome, intellectual delay. Presented to [facility] ED on 08/26/2023 after mechanical fall getting out of her bed. X-rays and imaging revealed right knee irreducible patellar dislocation right knee lateral femoral condyle impacting fracture as well as right knee medial patella impaction fracture. Pt underwent right knee arthroscopic assisted patellar reduction with lateral release on 08/27/2023 per Dr. [name].  Weightbearing as tolerated right lower extremity knee immobilizer when out of bed or when walking. Pt admitted to CIR from 09/02/23 - 09/15/23  PRECAUTIONS: Knee , Knee Immobilizer    WEIGHT BEARING RESTRICTIONS: Yes WBAT  PAIN:  Are you having pain? Yes: NPRS scale: 0/10 Pain location: knee  FALLS: Has patient fallen in last 6 months? No  LIVING ENVIRONMENT: Lives with: lives with their family Lives in: House/apartment Stairs: No ramp Has following equipment at home: Wheelchair (manual)  PLOF: Independent with basic ADLs  PATIENT GOALS: to return to school and go to [employer] with no fear of falling   NEXT MD VISIT: none  OBJECTIVE:  Note: Objective measures were completed at Evaluation unless otherwise noted.  HAND DOMINANCE: Left  ADLs: Transfers/ambulation related to ADLs: SUPERVISION short distances using RW, MOD I using w/c Eating: IND Grooming: IND Upper body dressing: SETUP assist Lower body dressing: MAX A Toileting: SUPERVISION Bathing: MOD A bathing   Tub shower transfers: SUPERVISION Equipment: Shower seat with back, Walk in shower, and bed side commode  UPPER EXTREMITY ROM:     Active ROM Right  [facility] Left  [facility]  UPPER EXTREMITY MMT:   Question accuracy given difficulty following commands   MMT Right eval Left eval  Shoulder flexion 3+/5 3+/5  Shoulder abduction    Elbow flexion 3+/5 3+/5  Elbow extension 3+/5 3+/5  Wrist flexion    Wrist  extension    (Blank rows = not tested)  HAND FUNCTION: Grip strength: Right: 10 lbs; Left: 15 lbs, Lateral pinch: Right: 4 lbs, Left: 5 lbs, and 3 point pinch: Right: 1 lbs, Left: 1 lbs  COORDINATION: 9 Hole Peg test: Right: 40 sec; Left: 30 sec  SENSATION: Light touch: Impaired  tingling  EDEMA: none  COGNITION: Overall cognitive status: History of cognitive impairments - at baseline Areas of impairment: Areas of impairment: Problem solving  Commands: Follows one step commands with increased time Behavior: Perseveration and anxious  OBSERVATIONS: Pt appears anxious and looking toward her mother for answers to questions.    TREATMENT DATE: 12/16/23  Therapeutic Exercise:  -Performed BUE strengthening using a 3# dowel ex. 2/2 to weakness. Bilateral shoulder flexion, chest press, circular patterns, and elbow flexion/extension for 2 sets 10-20 reps each. -Performed BUE strengthening using 2# hand weight for elbow flexion, and extension, forearm supination/pronation, and wrist flexion/extension. -Progressive gross grip strengthening with 6.6# of grip strength resistive force. -Lateral, and 3pt. Pinch strengthening using yellow, red, green, and blue, and black level resistive clips    PATIENT EDUCATION: Education details: BUE strengthening, funcyional reaching Person educated: Counselling psychologist, pt Education method: Medical illustrator Education comprehension: verbalized understanding, mother receptive  HOME EXERCISE PROGRAM:  Theraputty  GOALS: Goals reviewed with patient? Yes    SHORT TERM GOALS: Target date: 11/24/23  Pt will INDEPENDENTLY complete HEP  Baseline: 11/15/2023: Supervision, cues 10/14/23: no HEP issued Goal status: INITIAL  LONG TERM GOALS: Target date: 01/06/24  Pt will increase B strength by 15# to assist with gripping grab bar for safe transfers   Baseline: 11/15/23: Pt. Is able to grip  the grab bar. 10/14/23: L 15#, R 10#  Goal status:  progressing,  ongoing  2.  Pt will INDEPENDENTLY transfer to regular height toilet  Baseline: 11/15/23: Modified Independent toileting at home, Supervision using the hospital clinic bathroom Eval: SUPERVISION + RW for elevated toilet, MIN A + RW for regular toilet Goal status: progressing, ongoing 3.  Pt will improve B FMC by decreasing 9 hole PEG test score by 10 seconds for participation in leisure activities (coloring).  Baseline: 11/15/23:Continue Eval: L 30, R 40 Goal status: Ongoing  4.  Pt will verbalize x3 strategies to manage anxiousness and fear of falling to reduce fall risk. Baseline: 11/15/23: Consistent cues required Eval: 0/3 strategies.  Goal status: Ongoing  5.  Pt will increase B lateral pinch strength by 5# to manage ADL items.  Baseline: Eval: Continue R 4#, L 5# Goal status: Ongoing  6. Pt. Will perform LE dressing with Supervision with A/E use. Baseline: Eval: MinA donning/doffing socks, ModA shoes Goal Status: Ongoing    ASSESSMENT:  CLINICAL IMPRESSION:  Pt. was present with her mother, and an interpreter today. Pt. reports having had no further episodes of passing out, or not waking up from sleeping. Pt. requested to continue working on improving BUE strengthening. Pt. is tolerating 3# dowel UE strengthening exercises. Pt. required frequent consistent cues, and assist to perform all strengthening tasks. Pt. requires consistent cues for pace, and form. Plan to incorporate standing with  UE strengthening next session. Pt. continues to work on improving, and maximizing independence with ADLs, and IADLs.  Pt. continues to benefit from OT services to improve BUE strength/dexterity and to provide ADL training in order to maximize indep with daily tasks.   PERFORMANCE DEFICITS: in functional skills including ADLs, coordination, strength, pain, and balance, cognitive skills including attention and problem solving, and psychosocial skills including coping strategies and environmental  adaptation.   IMPAIRMENTS: are limiting patient from ADLs and leisure.   COMORBIDITIES: has co-morbidities such as Prader-Willi syndrome, intellectual delay that affect occupational performance. Patient will benefit from skilled OT to address above impairments and improve overall function.  MODIFICATION OR ASSISTANCE TO COMPLETE EVALUATION: Min-Moderate modification of tasks or assist with assess necessary to complete an evaluation.  OT OCCUPATIONAL PROFILE AND HISTORY: Detailed assessment: Review of records and additional review of physical, cognitive, psychosocial history related to current functional performance.  CLINICAL DECISION MAKING: Moderate - several treatment options, min-mod task modification necessary  REHAB POTENTIAL: Good  EVALUATION COMPLEXITY: Moderate      PLAN:  OT FREQUENCY: 1-2x/week  OT DURATION: 12 weeks  PLANNED INTERVENTIONS: 97535 self care/ADL training, 56387 therapeutic exercise, 97530 therapeutic activity, patient/family education, and DME and/or AE instructions  RECOMMENDED OTHER SERVICES: PT  CONSULTED AND AGREED WITH PLAN OF CARE: Patient and family member/caregiver  PLAN FOR NEXT SESSION: ADLs  Duey Ghent, MS, OTR/L   12/16/2023, 5:26 PM

## 2023-12-16 NOTE — Therapy (Signed)
 OUTPATIENT PHYSICAL THERAPY LE TREATMENT   Patient Name: Linda Mason MRN: [id number] DOB:Oct 03, 1995, 28 y.o., female Today's Date: 12/16/2023  END OF SESSION:  PT End of Session - 12/16/23 0853     Visit Number 15    Number of Visits 25    Date for PT Re-Evaluation 01/10/24    Progress Note Due on Visit 20    PT Start Time 0851    PT Stop Time 0930    PT Time Calculation (min) 39 min    Equipment Utilized During Treatment Gait belt    Activity Tolerance Patient tolerated treatment well    Behavior During Therapy WFL for tasks assessed/performed           Past Medical History:  Diagnosis Date   Diabetes mellitus without complication (HCC)    Past Surgical History:  Procedure Laterality Date   KNEE ARTHROSCOPY Right 08/27/2023   Procedure: ARTHROSCOPY KNEE;  Surgeon: [name], MD;  Location: [facility] ORS;  Service: Orthopedics;  Laterality: Right;   Patient Active Problem List   Diagnosis Date Noted   Closed patellar dislocation, right, initial encounter 09/01/2023   Patellar dislocation 08/26/2023   Type 2 diabetes mellitus without complication, with long-term current use of insulin  (HCC) 08/26/2023   Hypoxia 08/26/2023   Morbid obesity (HCC) 10/09/2021   Diabetes mellitus with neuropathy (HCC) 10/09/2021   Essential hypertension 10/09/2021   Thrombocytopenia (HCC) 10/09/2021   Hypothyroidism 10/09/2021   Prader-Willi syndrome 10/09/2021   Cellulitis 10/08/2021     [facility]P: [name]  REFERRING PROVIDER: [name], PA-C   REFERRING DIAG:  Diagnosis  S83.004A (ICD-10-CM) - Closed patellar dislocation, right, initial encounter    Rationale for Evaluation and Treatment: Rehabilitation  THERAPY DIAG:  Muscle weakness (generalized)  Difficulty in walking, not elsewhere classified  Unsteadiness on feet  Chronic pain of right knee  Other lack of coordination  Closed patellar dislocation, right, initial  encounter  ONSET DATE: 08/26/2023   SUBJECTIVE:                                                                                                                                                                                           SUBJECTIVE STATEMENT  Pt reports that she is doing well. No pain today, but feels like her legs are a little weaker today. Requiring increased UE support with ambulation on this day.   No pain reported.  Will have follow-up with PCP and cardiology on 6/17 from recent trip to ED from syncope.   From Eval: The pt is a pleasant 28 yo female. She presents in [facility] with her mom  and an interpreter present for PT eval. Pt suffered a R knee dislocation following a fall 08/26/2023. Pt also found to have lateral femoral condyle impacting fx and medial patella impaction fracture of RLE. She underwent arthroscopic assisted R patellar reduction with lateral release on 08/27/2023. Pt admitted to CIR from 09/02/23 - 09/15/23/ Pt just had follow-up with surgeon and has been instructed to wean from hinged knee brace/has been released from care; she is WBAT. Pt's mother reports pt using 2WW to ambulate for short distances: maybe ambulating 30-50 steps at most without brace. Pt reports ambulation is limited by R knee pain. Her knee hurts most when walking. She has difficulty completing STS but reports she can do this on her own, although not able to do this yet in her school's bathroom and would like to work on this. Pt was independent with ADLs prior to injury. Her mother is helping her now with dressing. She has not yet used steps to enter her home, is currently using her WC on ramp. They report no other falls. Pt wants to improve ability to ambulate for her school prom. She likes to watch movies.  Pt has Prader-Willi syndrome, intellectual delay.   PERTINENT HISTORY:   PMH also significant for DM II with neuropathy, HTN, thrombocytopenia, hypothyroidism, Prader-Willi syndrome.    PAIN:   Are you having pain? None currently, takes medicine for her knee pain; no other pain  PRECAUTIONS:  Knee - WBAT  RED FLAGS: None   WEIGHT BEARING RESTRICTIONS:  Via surgeon note 1. I recommended the patient be weightbearing as tolerated and wean from the hinged knee brace.  FALLS:  Has patient fallen in last 6 months? No  LIVING ENVIRONMENT: Lives with: lives with their family in one level home Stairs: 2 steps, handrails bilat to enter home, but also has two ramps that she uses to enter home and is not currently using steps Has following equipment at home: RW  OCCUPATION:  Student   PLOF:  Independent  PATIENT GOALS:  Pt wants to walk to be able to go to school prom; she wants to be able to dance.  NEXT MD VISIT: Had follow-up today,  was released from care but can follow-up as needed.    OBJECTIVE:  Note: Objective measures were completed at Evaluation unless otherwise noted.  DIAGNOSTIC FINDINGS:  Via chart, note [name], MD R Knee radiographs:  10/18/2023: 2 views (wheelchair bound lateral and oblique radiographs with suboptimal positioning) were obtained. Appropriate images cannot be obtained due to patient's hesitation/unwillingness to transfer to x-ray table. Within these limitations, there does not appear to be a recurrent patellar dislocation. There is significant osteopenia. Impaction fracture about the lateral femoral condyle is also visualized. It does not appear to fully involve the articular weightbearing surface.   PATIENT SURVEYS:  LEFS deferred  COGNITIVE STATUS: Impaired at baseline   SENSATION: WFL BLE  COORDINATION: WFL rapid alt LE, unable to do heel>shin likely limited by body habitus and some hesitance/faer of movement  EDEMA:  Pt R knee still with slight edema around patella, no redness present, scars healing well  POSTURE:  rounded shoulders, increased fwd posture/kyphosis possibly impacted by body habitus     GAIT: Distance walked: (see below) Assistive device utilized: Environmental consultant - 2 wheeled Level of assistance: CGA Comments: Decreased gait speed, antalgic gait, decreased weightbearing and stance time on RLE, heavy BUE weightbearing through RW    Body Part #1 Knee  PALPATION: No pain with palpation  superior, inferior, LTL patella, posterior knee RLE  LOWER EXTREMITY ROM:    Need to complete formal assessment, however pt exhibited to have impaired R hip flexion mobility with MMT, possibly due positioning but needs further assessment.    LOWER EXTREMITY MMT:    MMT Right eval Left eval  Hip flexion 3- 3+  Hip extension    Hip abduction 4 4  Hip adduction 3* 4  Hip internal rotation    Hip external rotation    Knee flexion * 4  Knee extension 3* 4+  Ankle dorsiflexion 4 4  Ankle plantarflexion    Ankle inversion    Ankle eversion     (Blank rows = not tested) * indicates pain-limited. Pt also might be self-limiting due to fear of pain with movement     FUNCTIONAL TESTS:  5 times sit to stand: 42.5 sec heavy use of UE to RW  6 minute walk test: deferred tp future visit 10 meter walk test: 0.16 sec with BRW  Berg Balance Scale: deferred to future visit                                                                                                                        TREATMENT DATE:   Pt requires UE support from RW to ambulate into rehab gym on this day.   TE and TA for improved functional mobility and increased strength, ROM in BLE   Reciprocal foot tap with heavy progressing to light UE support on 6inch step x 10 total   Forward/reverse gait with RW 75ft x 5 with cues for improved knee flexion on the RLE into reverse Standing reciprocal knee flexion x 10 bil  Standing  with 1 LE on 6inch step Lateral/cross body reach to grap and move hedge hogs from table to plinth 2x 12 bil   Gait without AD 3 x 76ft with CGA for safety. Reduced antalgic gait pattern with  increased distance on each bout.   Stair management to step to ascent leading with LLE x 1 and RLE x 2. All descent performed leading with the RLE.   Additional gait with RW 2 x 44ft and no AD x 59ft with light HHA for last 72ft ft due to reported fatigue by pt.    PATIENT EDUCATION:  Education details: Pt educated throughout session about proper posture and technique with exercises. Improved exercise technique, movement at target joints, use of target muscles after min to mod verbal, visual, tactile cues.  Person educated: Patient and Parent Education method: Explanation, Demonstration, Tactile cues, and Verbal cues Education comprehension: verbalized understanding, returned demonstration, verbal cues required, tactile cues required, and needs further education  HOME EXERCISE PROGRAM: Reviewed: safe technique, signs and symptoms to watch for that proceeding is OK, stop if feeling increased pain/any sharp pain in knee Access Code: [email or web address] URL: [email or web address] Date: 10/18/2023 Prepared by: Aminta Kales  Exercises - Sit to Stand with Counter Support  - 1 x daily - 7 x  weekly - 2 sets - 5 reps    ASSESSMENT:  CLINICAL IMPRESSION:  Pt continues to improve with gait pattern and endurance with longer ambulation without AD, but Pt hesitant to ambulate without Due to feeling weaker today. Was able to ascent steps leading with the RLE on this day, demonstrating significant improvement tolerance to WB and gluteal strength. Pt will continue to benefit from skilled therapy to address remaining deficits in order to improve overall QoL and return to PLOF.       OBJECTIVE IMPAIRMENTS: Abnormal gait, decreased activity tolerance, decreased balance, decreased coordination, decreased endurance, decreased mobility, difficulty walking, decreased ROM, decreased strength, increased edema, impaired flexibility, improper body mechanics, postural dysfunction, obesity, and pain.    ACTIVITY LIMITATIONS: carrying, lifting, bending, standing, squatting, stairs, transfers, toileting, dressing, and locomotion level  PARTICIPATION LIMITATIONS: meal prep, cleaning, laundry, shopping, community activity, yard work, and school  PERSONAL FACTORS: Fitness, Sex, and 3+ comorbidities: PMH significant for DM II with neuropathy, HTN, thrombocytopenia, hypothyroidism, Prader-Willi syndrome,  are also affecting patient's functional outcome.   REHAB POTENTIAL: Good  CLINICAL DECISION MAKING: Evolving/moderate complexity  EVALUATION COMPLEXITY: Moderate   GOALS: Goals reviewed with patient? Yes  SHORT TERM GOALS: Target date: 11/29/2023  Patient will be independent in home exercise program to improve strength/mobility for better functional independence with ADLs. Baseline: initiated Goal status: INITIAL    LONG TERM GOALS: Target date: 01/10/2024  Patient will increase lower extremity functional scale to >60/80 to demonstrate improved functional mobility and increased tolerance with ADLs. .  Baseline: deferred Goal status: INITIAL  2.  Patient (> 17 years old) will complete five times sit to stand test in < 15 seconds indicating an increased LE strength and improved balance. Baseline: 42.5 sec, heavy use of BUE 5/21: 14.27 sec.  Heavy use of BUE on RW  Goal status: IN PROGRESS  3.  Patient will increase Berg Balance score by > 6 points to demonstrate decreased fall risk during functional activities Baseline: deferred to future session, may need to complete different balance test if pt has difficulty with cuing for test activities  5/21: 33/56 Goal status: IN PROGRESS  4.  Patient will increase 10 meter walk test to >1.21m/s as to improve gait speed for better community ambulation and to reduce fall risk. Baseline: 0.16 with BRW 5/21: 0.50m/s with RW  12/02/2023= 0.31 m/s with RW Goal status: IN PROGRESS  5.  Patient will increase six minute walk test distance to  >1000 for progression to community ambulator and improve gait ability. Baseline:  280 ft with RW 5/21: 331ft with RW  Goal status: IN PROGRESS    PLAN:  PT FREQUENCY: 1-2x/week  PT DURATION: 12 weeks  PLANNED INTERVENTIONS: 97164- PT Re-evaluation, 97750- Physical Performance Testing, 97110-Therapeutic exercises, 97530- Therapeutic activity, [id number]- Neuromuscular re-education, 97535- Self Care, 16109- Manual therapy, [phone]- Gait training, [phone]- Orthotic Initial, [phone]- Orthotic/Prosthetic subsequent, [phone]- Canalith repositioning, [id number]- Splinting, [id number]- Electrical stimulation (unattended), [phone]- Electrical stimulation (manual), [id number]- Ultrasound, Patient/Family education, Balance training, Stair training, Taping, Joint mobilization, Joint manipulation, Spinal mobilization, Scar mobilization, Vestibular training, DME instructions, Wheelchair mobility training, Cryotherapy, and Moist heat.  PLAN FOR NEXT SESSION:  - Continue with  RLE muscle strengthening - Continue with more weight bearing activities for RLE with decreased UE support- Consider using blaze pods Continue with overall static and dynamic balance - add to HEP as appropriate.    Aurora Lees PT, DPT  Physical Therapist - [place]  [facility]  8:56  AM 12/16/23

## 2023-12-21 ENCOUNTER — Ambulatory Visit: Payer: MEDICAID | Admitting: Physical Therapy

## 2023-12-21 DIAGNOSIS — G8929 Other chronic pain: Secondary | ICD-10-CM

## 2023-12-21 DIAGNOSIS — R2681 Unsteadiness on feet: Secondary | ICD-10-CM

## 2023-12-21 DIAGNOSIS — M6281 Muscle weakness (generalized): Secondary | ICD-10-CM

## 2023-12-21 DIAGNOSIS — R262 Difficulty in walking, not elsewhere classified: Secondary | ICD-10-CM

## 2023-12-21 DIAGNOSIS — R278 Other lack of coordination: Secondary | ICD-10-CM

## 2023-12-21 NOTE — Therapy (Signed)
 OUTPATIENT PHYSICAL THERAPY LE TREATMENT   Patient Name: Linda Mason MRN: [id number] DOB:01/04/1996, 28 y.o., female Today's Date: 12/21/2023  END OF SESSION:  PT End of Session - 12/21/23 1625     Visit Number 16    Number of Visits 25    Date for PT Re-Evaluation 01/10/24    Progress Note Due on Visit 20    PT Start Time 1617    PT Stop Time 1657    PT Time Calculation (min) 40 min    Equipment Utilized During Treatment Gait belt    Activity Tolerance Patient tolerated treatment well    Behavior During Therapy WFL for tasks assessed/performed           Past Medical History:  Diagnosis Date   Diabetes mellitus without complication (HCC)    Past Surgical History:  Procedure Laterality Date   KNEE ARTHROSCOPY Right 08/27/2023   Procedure: ARTHROSCOPY KNEE;  Surgeon: [name], MD;  Location: [facility] ORS;  Service: Orthopedics;  Laterality: Right;   Patient Active Problem List   Diagnosis Date Noted   Closed patellar dislocation, right, initial encounter 09/01/2023   Patellar dislocation 08/26/2023   Type 2 diabetes mellitus without complication, with long-term current use of insulin  (HCC) 08/26/2023   Hypoxia 08/26/2023   Morbid obesity (HCC) 10/09/2021   Diabetes mellitus with neuropathy (HCC) 10/09/2021   Essential hypertension 10/09/2021   Thrombocytopenia (HCC) 10/09/2021   Hypothyroidism 10/09/2021   Prader-Willi syndrome 10/09/2021   Cellulitis 10/08/2021     [facility]P: [name]  REFERRING PROVIDER: [name], PA-C   REFERRING DIAG:  Diagnosis  S83.004A (ICD-10-CM) - Closed patellar dislocation, right, initial encounter    Rationale for Evaluation and Treatment: Rehabilitation  THERAPY DIAG:  Muscle weakness (generalized)  Difficulty in walking, not elsewhere classified  Unsteadiness on feet  Chronic pain of right knee  Other lack of coordination  ONSET DATE: 08/26/2023   SUBJECTIVE:                                                                                                                                                                                            SUBJECTIVE STATEMENT  Pt reports that she is doing well. No pain today Requiring increased UE support with ambulation on this day.  Went to see cardiologist yesterday. Will be wearing Heart monitor for 3 days until Thursday 6/19. No change in medication.      From Eval: The pt is a pleasant 28 yo female. She presents in [facility] with her mom and an interpreter present for PT eval. Pt suffered a R knee dislocation following a  fall 08/26/2023. Pt also found to have lateral femoral condyle impacting fx and medial patella impaction fracture of RLE. She underwent arthroscopic assisted R patellar reduction with lateral release on 08/27/2023. Pt admitted to CIR from 09/02/23 - 09/15/23/ Pt just had follow-up with surgeon and has been instructed to wean from hinged knee brace/has been released from care; she is WBAT. Pt's mother reports pt using 2WW to ambulate for short distances: maybe ambulating 30-50 steps at most without brace. Pt reports ambulation is limited by R knee pain. Her knee hurts most when walking. She has difficulty completing STS but reports she can do this on her own, although not able to do this yet in her school's bathroom and would like to work on this. Pt was independent with ADLs prior to injury. Her mother is helping her now with dressing. She has not yet used steps to enter her home, is currently using her WC on ramp. They report no other falls. Pt wants to improve ability to ambulate for her school prom. She likes to watch movies.  Pt has Prader-Willi syndrome, intellectual delay.   PERTINENT HISTORY:   PMH also significant for DM II with neuropathy, HTN, thrombocytopenia, hypothyroidism, Prader-Willi syndrome.    PAIN:  Are you having pain? None currently, takes medicine for her knee pain; no other pain  PRECAUTIONS:  Knee  - WBAT  RED FLAGS: None   WEIGHT BEARING RESTRICTIONS:  Via surgeon note 1. I recommended the patient be weightbearing as tolerated and wean from the hinged knee brace.  FALLS:  Has patient fallen in last 6 months? No  LIVING ENVIRONMENT: Lives with: lives with their family in one level home Stairs: 2 steps, handrails bilat to enter home, but also has two ramps that she uses to enter home and is not currently using steps Has following equipment at home: RW  OCCUPATION:  Student   PLOF:  Independent  PATIENT GOALS:  Pt wants to walk to be able to go to school prom; she wants to be able to dance.  NEXT MD VISIT: Had follow-up today,  was released from care but can follow-up as needed.    OBJECTIVE:  Note: Objective measures were completed at Evaluation unless otherwise noted.  DIAGNOSTIC FINDINGS:  Via chart, note [name], MD R Knee radiographs:  10/18/2023: 2 views (wheelchair bound lateral and oblique radiographs with suboptimal positioning) were obtained. Appropriate images cannot be obtained due to patient's hesitation/unwillingness to transfer to x-ray table. Within these limitations, there does not appear to be a recurrent patellar dislocation. There is significant osteopenia. Impaction fracture about the lateral femoral condyle is also visualized. It does not appear to fully involve the articular weightbearing surface.   PATIENT SURVEYS:  LEFS deferred  COGNITIVE STATUS: Impaired at baseline   SENSATION: WFL BLE  COORDINATION: WFL rapid alt LE, unable to do heel>shin likely limited by body habitus and some hesitance/faer of movement  EDEMA:  Pt R knee still with slight edema around patella, no redness present, scars healing well  POSTURE:  rounded shoulders, increased fwd posture/kyphosis possibly impacted by body habitus    GAIT: Distance walked: (see below) Assistive device utilized: Environmental consultant - 2 wheeled Level of assistance:  CGA Comments: Decreased gait speed, antalgic gait, decreased weightbearing and stance time on RLE, heavy BUE weightbearing through RW    Body Part #1 Knee  PALPATION: No pain with palpation superior, inferior, LTL patella, posterior knee RLE  LOWER EXTREMITY ROM:    Need  to complete formal assessment, however pt exhibited to have impaired R hip flexion mobility with MMT, possibly due positioning but needs further assessment.    LOWER EXTREMITY MMT:    MMT Right eval Left eval  Hip flexion 3- 3+  Hip extension    Hip abduction 4 4  Hip adduction 3* 4  Hip internal rotation    Hip external rotation    Knee flexion * 4  Knee extension 3* 4+  Ankle dorsiflexion 4 4  Ankle plantarflexion    Ankle inversion    Ankle eversion     (Blank rows = not tested) * indicates pain-limited. Pt also might be self-limiting due to fear of pain with movement     FUNCTIONAL TESTS:  5 times sit to stand: 42.5 sec heavy use of UE to RW  6 minute walk test: deferred tp future visit 10 meter walk test: 0.16 sec with BRW  Berg Balance Scale: deferred to future visit                                                                                                                        TREATMENT DATE:   PT applied gait belt. CGA from PT for safety throughout session.  Sit stand without UE support x 10  Forward/reverse gait 60ft x 4 no UE support  Side stepping 6ft x 3. No UE support   HEP review as listed below.  UE support on rail   - Standing March with Counter Support  - 10 reps - Mini Squat with Counter Support  - 10 reps - Standing Knee Flexion with Counter Support - 10 reps - Standing Hip Abduction with bilateral Counter Support   - 10 reps - Heel Toe Raises with Counter Support  - 10 reps  Blaze pods.  Activity Description: standing at rail 4 pods on floor side stepping to each pod. Activity Setting:  random Number of Pods:  4 Cycles/Sets:  3 Duration (Time or Hit Count):   10  Patient Stats  Total time  Time:  57 sec, 42 sec 55sec   Gait without AD 2 x 16ft. CGA; decreased antalgic gait pattern on second bout.   PATIENT EDUCATION:  Education details: Pt educated throughout session about proper posture and technique with exercises. Improved exercise technique, movement at target joints, use of target muscles after min to mod verbal, visual, tactile cues.  Person educated: Patient and Parent Education method: Explanation, Demonstration, Tactile cues, and Verbal cues Education comprehension: verbalized understanding, returned demonstration, verbal cues required, tactile cues required, and needs further education  HOME EXERCISE PROGRAM: Reviewed: safe technique, signs and symptoms to watch for that proceeding is OK, stop if feeling increased pain/any sharp pain in knee Access Code: [email or web address] URL: [email or web address] Date: 12/21/2023 Prepared by: Aurora Lees  Exercises - Sit to Stand with Counter Support  - 1 x daily - 7 x weekly - 2 sets - 5 reps - Standing March with Counter Support  - 1  x daily - 3-4 x weekly - 3 sets - 10 reps - Mini Squat with Counter Support  - 1 x daily - 7 x weekly - 3 sets - 10 reps - Standing Knee Flexion with Counter Support  - 1 x daily - 7 x weekly - 3 sets - 10 reps - Standing Hip Abduction with Unilateral Counter Support  - 1 x daily - 7 x weekly - 3 sets - 10 reps - Heel Toe Raises with Counter Support  - 1 x daily - 7 x weekly - 3 sets - 10 reps    ASSESSMENT:  CLINICAL IMPRESSION:  Pt put forth good effort throughout session. continues to improve with gait pattern and endurance with longer ambulation without AD up to 62 ft in one bout on this day. Increased HEP for improved hip abduction and knee flexion as well as review with hand out provided. Pt will continue to benefit from skilled therapy to address remaining deficits in order to improve overall QoL and return to PLOF.       OBJECTIVE  IMPAIRMENTS: Abnormal gait, decreased activity tolerance, decreased balance, decreased coordination, decreased endurance, decreased mobility, difficulty walking, decreased ROM, decreased strength, increased edema, impaired flexibility, improper body mechanics, postural dysfunction, obesity, and pain.   ACTIVITY LIMITATIONS: carrying, lifting, bending, standing, squatting, stairs, transfers, toileting, dressing, and locomotion level  PARTICIPATION LIMITATIONS: meal prep, cleaning, laundry, shopping, community activity, yard work, and school  PERSONAL FACTORS: Fitness, Sex, and 3+ comorbidities: PMH significant for DM II with neuropathy, HTN, thrombocytopenia, hypothyroidism, Prader-Willi syndrome,  are also affecting patient's functional outcome.   REHAB POTENTIAL: Good  CLINICAL DECISION MAKING: Evolving/moderate complexity  EVALUATION COMPLEXITY: Moderate   GOALS: Goals reviewed with patient? Yes  SHORT TERM GOALS: Target date: 11/29/2023  Patient will be independent in home exercise program to improve strength/mobility for better functional independence with ADLs. Baseline: initiated Goal status: INITIAL    LONG TERM GOALS: Target date: 01/10/2024  Patient will increase lower extremity functional scale to >60/80 to demonstrate improved functional mobility and increased tolerance with ADLs. .  Baseline: deferred Goal status: INITIAL  2.  Patient (> 41 years old) will complete five times sit to stand test in < 15 seconds indicating an increased LE strength and improved balance. Baseline: 42.5 sec, heavy use of BUE 5/21: 14.27 sec.  Heavy use of BUE on RW  Goal status: IN PROGRESS  3.  Patient will increase Berg Balance score by > 6 points to demonstrate decreased fall risk during functional activities Baseline: deferred to future session, may need to complete different balance test if pt has difficulty with cuing for test activities  5/21: 33/56 Goal status: IN PROGRESS  4.   Patient will increase 10 meter walk test to >1.30m/s as to improve gait speed for better community ambulation and to reduce fall risk. Baseline: 0.16 with BRW 5/21: 0.34m/s with RW  12/02/2023= 0.31 m/s with RW Goal status: IN PROGRESS  5.  Patient will increase six minute walk test distance to >1000 for progression to community ambulator and improve gait ability. Baseline:  280 ft with RW 5/21: 375ft with RW  Goal status: IN PROGRESS    PLAN:  PT FREQUENCY: 1-2x/week  PT DURATION: 12 weeks  PLANNED INTERVENTIONS: [id number]- PT Re-evaluation, 97750- Physical Performance Testing, 97110-Therapeutic exercises, 97530- Therapeutic activity, [id number]- Neuromuscular re-education, 97535- Self Care, 54098- Manual therapy, [id number]- Gait training, [id number]- Orthotic Initial, [id number]- Orthotic/Prosthetic subsequent, [phone]- Canalith repositioning, [id number]- Splinting, [id number]- Electrical stimulation (unattended), [id number]-  Electrical stimulation (manual), 91478- Ultrasound, Patient/Family education, Balance training, Stair training, Taping, Joint mobilization, Joint manipulation, Spinal mobilization, Scar mobilization, Vestibular training, DME instructions, Wheelchair mobility training, Cryotherapy, and Moist heat.  PLAN FOR NEXT SESSION:  - Continue with  RLE muscle strengthening - Continue with more weight bearing activities for RLE with decreased UE support- Consider using blaze pods Continue with overall static and dynamic balance    Aurora Lees PT, DPT  Physical Therapist - [facility]  [facility]  4:25 PM 12/21/23

## 2023-12-23 ENCOUNTER — Encounter: Payer: Self-pay | Admitting: Occupational Therapy

## 2023-12-23 ENCOUNTER — Ambulatory Visit: Payer: MEDICAID | Admitting: Occupational Therapy

## 2023-12-23 DIAGNOSIS — R278 Other lack of coordination: Secondary | ICD-10-CM

## 2023-12-23 DIAGNOSIS — M6281 Muscle weakness (generalized): Secondary | ICD-10-CM

## 2023-12-23 NOTE — Therapy (Signed)
 Occupational Therapy Ortho Treatment Note  Patient Name: Linda Mason MRN: [id number] DOB:Nov 08, 1995, 28 y.o., female Today's Date: 12/23/2023  [name] REFERRING PROVIDER: Georjean Kite  END OF SESSION:  OT End of Session - 12/23/23 1447     Visit Number 17    Number of Visits 24    Date for OT Re-Evaluation 01/06/24    OT Start Time 1447    OT Stop Time 1530    OT Time Calculation (min) 43 min    Equipment Utilized During Treatment RW    Activity Tolerance Patient tolerated treatment well    Behavior During Therapy [facility] for tasks assessed/performed         Past Medical History:  Diagnosis Date   Diabetes mellitus without complication (HCC)    Past Surgical History:  Procedure Laterality Date   KNEE ARTHROSCOPY Right 08/27/2023   Procedure: ARTHROSCOPY KNEE;  Surgeon: [name], MD;  Location: [facility] ORS;  Service: Orthopedics;  Laterality: Right;   Patient Active Problem List   Diagnosis Date Noted   Closed patellar dislocation, right, initial encounter 09/01/2023   Patellar dislocation 08/26/2023   Type 2 diabetes mellitus without complication, with long-term current use of insulin  (HCC) 08/26/2023   Hypoxia 08/26/2023   Morbid obesity (HCC) 10/09/2021   Diabetes mellitus with neuropathy (HCC) 10/09/2021   Essential hypertension 10/09/2021   Thrombocytopenia (HCC) 10/09/2021   Hypothyroidism 10/09/2021   Prader-Willi syndrome 10/09/2021   Cellulitis 10/08/2021   ONSET DATE: 08/26/2023  REFERRING DIAG: Right knee patella fracture  THERAPY DIAG:  Other lack of coordination  Muscle weakness (generalized)  Rationale for Evaluation and Treatment: Rehabilitation  SUBJECTIVE:   SUBJECTIVE STATEMENT:  Pt. Reports completing her showers standing independently without a chair.  Pt accompanied by: Interpreter:  Spanish speaking interpreter  PERTINENT HISTORY: Linda Mason is a 28 year old right-handed morbidly  obese female and BMI 44.88 as well as diabetes mellitus, Prader-Willi syndrome, intellectual delay. Presented to [facility] ED on 08/26/2023 after mechanical fall getting out of her bed. X-rays and imaging revealed right knee irreducible patellar dislocation right knee lateral femoral condyle impacting fracture as well as right knee medial patella impaction fracture. Pt underwent right knee arthroscopic assisted patellar reduction with lateral release on 08/27/2023 per Dr. [name].  Weightbearing as tolerated right lower extremity knee immobilizer when out of bed or when walking. Pt admitted to CIR from 09/02/23 - 09/15/23  PRECAUTIONS: Knee , Knee Immobilizer    WEIGHT BEARING RESTRICTIONS: Yes WBAT  PAIN:  Are you having pain? Yes: NPRS scale: 0/10 Pain location: knee  FALLS: Has patient fallen in last 6 months? No  LIVING ENVIRONMENT: Lives with: lives with their family Lives in: House/apartment Stairs: No ramp Has following equipment at home: Wheelchair (manual)  PLOF: Independent with basic ADLs  PATIENT GOALS: to return to school and go to [employer] with no fear of falling   NEXT MD VISIT: none  OBJECTIVE:  Note: Objective measures were completed at Evaluation unless otherwise noted.  HAND DOMINANCE: Left  ADLs: Transfers/ambulation related to ADLs: SUPERVISION short distances using RW, MOD I using w/c Eating: IND Grooming: IND Upper body dressing: SETUP assist Lower body dressing: MAX A Toileting: SUPERVISION Bathing: MOD A bathing   Tub shower transfers: SUPERVISION Equipment: Shower seat with back, Walk in shower, and bed side commode  UPPER EXTREMITY ROM:     Active ROM Right  WFL Left  WFL    UPPER EXTREMITY MMT:   Question  accuracy given difficulty following commands   MMT Right eval Left eval  Shoulder flexion 3+/5 3+/5  Shoulder abduction    Elbow flexion 3+/5 3+/5  Elbow extension 3+/5 3+/5  Wrist flexion    Wrist extension    (Blank rows = not  tested)  HAND FUNCTION: Grip strength: Right: 10 lbs; Left: 15 lbs, Lateral pinch: Right: 4 lbs, Left: 5 lbs, and 3 point pinch: Right: 1 lbs, Left: 1 lbs  COORDINATION: 9 Hole Peg test: Right: 40 sec; Left: 30 sec  SENSATION: Light touch: Impaired  tingling  EDEMA: none  COGNITION: Overall cognitive status: History of cognitive impairments - at baseline Areas of impairment: Areas of impairment: Problem solving  Commands: Follows one step commands with increased time Behavior: Perseveration and anxious  OBSERVATIONS: Pt appears anxious and looking toward her mother for answers to questions.    TREATMENT DATE: 12/23/23  Therapeutic Exercise:  -Performed BUE strengthening using a 3# dowel ex in standing: Bilateral shoulder flexion, chest press, circular patterns, and elbow flexion/extension for 2 sets 10-20 reps each.  -Performed BUE strengthening using 4# hand weight for elbow flexion, and extension in standing -Tolerated functional reaching balance exercise using 1# dumbbell for shoulder flexion in I and Y plane and crossing midline. -Lateral, and 3pt. Pinch strengthening using yellow, red, green, and blue, and black level resistive clips in standing for L and R hands x1 trial each. 2nd trial pt picked up clips with L hand and passed to R hand prior to clipping. Repeated on R side.    PATIENT EDUCATION: Education details: BUE strengthening, funcyional reaching Person educated: Parent, pt Education method: Medical illustrator Education comprehension: verbalized understanding, mother receptive  HOME EXERCISE PROGRAM:  Theraputty  GOALS: Goals reviewed with patient? Yes    SHORT TERM GOALS: Target date: 11/24/23  Pt will INDEPENDENTLY complete HEP  Baseline: 11/15/2023: Supervision, cues 10/14/23: no HEP issued Goal status: INITIAL  LONG TERM GOALS: Target date: 01/06/24  Pt will increase B strength by 15# to assist with gripping grab bar for safe transfers    Baseline: 11/15/23: Pt. Is able to grip  the grab bar. 10/14/23: L 15#, R 10#  Goal status:  progressing, ongoing  2.  Pt will INDEPENDENTLY transfer to regular height toilet  Baseline: 11/15/23: Modified Independent toileting at home, Supervision using the hospital clinic bathroom Eval: SUPERVISION + RW for elevated toilet, MIN A + RW for regular toilet Goal status: progressing, ongoing 3.  Pt will improve B FMC by decreasing 9 hole PEG test score by 10 seconds for participation in leisure activities (coloring).  Baseline: 11/15/23:Continue Eval: L 30, R 40 Goal status: Ongoing  4.  Pt will verbalize x3 strategies to manage anxiousness and fear of falling to reduce fall risk. Baseline: 11/15/23: Consistent cues required Eval: 0/3 strategies.  Goal status: Ongoing  5.  Pt will increase B lateral pinch strength by 5# to manage ADL items.  Baseline: Eval: Continue R 4#, L 5# Goal status: Ongoing  6. Pt. Will perform LE dressing with Supervision with A/E use. Baseline: Eval: MinA donning/doffing socks, ModA shoes Goal Status: Ongoing    ASSESSMENT:  CLINICAL IMPRESSION:  Pt. was present with her mother, and an interpreter today. Pt. requested to continue working on improving BUE strengthening. Tolerated all exercises in standing with single or no UE support on walker. Completed 3# dowel, 4# and 1# dumbbell UE strengthening exercises. Requires consistent cues for pace and technique t/o. Demonstrating improved balance without UE support  to reach outside BOS. Plan to increase weight and reps next session. Pt. continues to work on improving, and maximizing independence with ADLs, and IADLs. Pt. continues to benefit from OT services to improve BUE strength/dexterity and to provide ADL training in order to maximize indep with daily tasks.   PERFORMANCE DEFICITS: in functional skills including ADLs, coordination, strength, pain, and balance, cognitive skills including attention and problem solving,  and psychosocial skills including coping strategies and environmental adaptation.   IMPAIRMENTS: are limiting patient from ADLs and leisure.   COMORBIDITIES: has co-morbidities such as Prader-Willi syndrome, intellectual delay that affect occupational performance. Patient will benefit from skilled OT to address above impairments and improve overall function.  MODIFICATION OR ASSISTANCE TO COMPLETE EVALUATION: Min-Moderate modification of tasks or assist with assess necessary to complete an evaluation.  OT OCCUPATIONAL PROFILE AND HISTORY: Detailed assessment: Review of records and additional review of physical, cognitive, psychosocial history related to current functional performance.  CLINICAL DECISION MAKING: Moderate - several treatment options, min-mod task modification necessary  REHAB POTENTIAL: Good  EVALUATION COMPLEXITY: Moderate      PLAN:  OT FREQUENCY: 1-2x/week  OT DURATION: 12 weeks  PLANNED INTERVENTIONS: 97535 self care/ADL training, 40981 therapeutic exercise, 97530 therapeutic activity, patient/family education, and DME and/or AE instructions  RECOMMENDED OTHER SERVICES: PT  CONSULTED AND AGREED WITH PLAN OF CARE: Patient and family member/caregiver  PLAN FOR NEXT SESSION: ADLs  Gordan Latina, M.S. OTR/L  12/23/23, 3:28 PM  ascom [phone]   12/23/2023, 3:27 PM

## 2023-12-28 ENCOUNTER — Ambulatory Visit: Payer: MEDICAID

## 2023-12-28 ENCOUNTER — Ambulatory Visit: Payer: MEDICAID | Admitting: Occupational Therapy

## 2023-12-28 DIAGNOSIS — M6281 Muscle weakness (generalized): Secondary | ICD-10-CM | POA: Diagnosis not present

## 2023-12-28 DIAGNOSIS — R278 Other lack of coordination: Secondary | ICD-10-CM

## 2023-12-28 DIAGNOSIS — R2681 Unsteadiness on feet: Secondary | ICD-10-CM

## 2023-12-28 DIAGNOSIS — R262 Difficulty in walking, not elsewhere classified: Secondary | ICD-10-CM

## 2023-12-28 NOTE — Therapy (Addendum)
 Occupational Therapy Ortho Treatment Note  Patient Name: Linda Mason MRN: [id number] DOB:1995/09/27, 28 y.o., female Today's Date: 12/28/2023  PCP: [facility] REFERRING PROVIDER: Toribio Pitch  END OF SESSION:  OT End of Session - 12/28/23 1407     Visit Number 18    Number of Visits 24    Date for OT Re-Evaluation 01/06/24    OT Start Time 1018    OT Stop Time 1100    OT Time Calculation (min) 42 min    Activity Tolerance Patient tolerated treatment well    Behavior During Therapy [facility] for tasks assessed/performed          Past Medical History:  Diagnosis Date   Diabetes mellitus without complication (HCC)    Past Surgical History:  Procedure Laterality Date   KNEE ARTHROSCOPY Right 08/27/2023   Procedure: ARTHROSCOPY KNEE;  Surgeon: Tobie Priest, MD;  Location: [facility] ORS;  Service: Orthopedics;  Laterality: Right;   Patient Active Problem List   Diagnosis Date Noted   Closed patellar dislocation, right, initial encounter 09/01/2023   Patellar dislocation 08/26/2023   Type 2 diabetes mellitus without complication, with long-term current use of insulin  (HCC) 08/26/2023   Hypoxia 08/26/2023   Morbid obesity (HCC) 10/09/2021   Diabetes mellitus with neuropathy (HCC) 10/09/2021   Essential hypertension 10/09/2021   Thrombocytopenia (HCC) 10/09/2021   Hypothyroidism 10/09/2021   Prader-Willi syndrome 10/09/2021   Cellulitis 10/08/2021   ONSET DATE: 08/26/2023  REFERRING DIAG: Right knee patella fracture  THERAPY DIAG:  Other lack of coordination  Muscle weakness (generalized)  Rationale for Evaluation and Treatment: Rehabilitation  SUBJECTIVE:   SUBJECTIVE STATEMENT:  Pt. Reports completing her showers standing independently without a chair.  Pt accompanied by: Interpreter:  Spanish speaking interpreter  PERTINENT HISTORY: Linda Mason is a 28 year old right-handed morbidly obese female and BMI 44.88 as well as  diabetes mellitus, Prader-Willi syndrome, intellectual delay. Presented to [facility] ED on 08/26/2023 after mechanical fall getting out of her bed. X-rays and imaging revealed right knee irreducible patellar dislocation right knee lateral femoral condyle impacting fracture as well as right knee medial patella impaction fracture. Pt underwent right knee arthroscopic assisted patellar reduction with lateral release on 08/27/2023 per Dr. Priest Tobie.  Weightbearing as tolerated right lower extremity knee immobilizer when out of bed or when walking. Pt admitted to CIR from 09/02/23 - 09/15/23  PRECAUTIONS: Knee , Knee Immobilizer    WEIGHT BEARING RESTRICTIONS: Yes WBAT  PAIN:  Are you having pain? Yes: NPRS scale: 0/10 Pain location: knee  FALLS: Has patient fallen in last 6 months? No  LIVING ENVIRONMENT: Lives with: lives with their family Lives in: House/apartment Stairs: No ramp Has following equipment at home: Wheelchair (manual)  PLOF: Independent with basic ADLs  PATIENT GOALS: to return to school and go to [employer] with no fear of falling   NEXT MD VISIT: none  OBJECTIVE:  Note: Objective measures were completed at Evaluation unless otherwise noted.  HAND DOMINANCE: Left  ADLs: Transfers/ambulation related to ADLs: SUPERVISION short distances using RW, MOD I using w/c Eating: IND Grooming: IND Upper body dressing: SETUP assist Lower body dressing: MAX A Toileting: SUPERVISION Bathing: MOD A bathing   Tub shower transfers: SUPERVISION Equipment: Shower seat with back, Walk in shower, and bed side commode  UPPER EXTREMITY ROM:     Active ROM Right  WFL Left  WFL    UPPER EXTREMITY MMT:   Question accuracy given difficulty following commands  MMT Right eval Left eval  Shoulder flexion 3+/5 3+/5  Shoulder abduction    Elbow flexion 3+/5 3+/5  Elbow extension 3+/5 3+/5  Wrist flexion    Wrist extension    (Blank rows = not tested)  HAND FUNCTION: Grip strength:  Right: 10 lbs; Left: 15 lbs, Lateral pinch: Right: 4 lbs, Left: 5 lbs, and 3 point pinch: Right: 1 lbs, Left: 1 lbs  COORDINATION: 9 Hole Peg test: Right: 40 sec; Left: 30 sec  SENSATION: Light touch: Impaired  tingling  EDEMA: none  COGNITION: Overall cognitive status: History of cognitive impairments - at baseline Areas of impairment: Areas of impairment: Problem solving  Commands: Follows one step commands with increased time Behavior: Perseveration and anxious  OBSERVATIONS: Pt appears anxious and looking toward her mother for answers to questions.    TREATMENT DATE: 12/28/23  Therapeutic Exercise:  -Performed BUE strengthening using a 3# dowel ex while sitting: Bilateral shoulder flexion, chest press, circular patterns, and elbow flexion/extension for 2 sets 10-20 reps each.  -Performed BUE strengthening while sitting using 2# hand weight for wrist supination/pronation.  Therapeutic Activities:   -Tolerated 2 sets of BUE functional reaching  in standing  while placing yellow, red, green, blue and black level resistive clips onto horizontal dowel in multiple planes, crossing midline. -Pt. picked up yellow, red, green, blue and black resistive clips from table top surface during 2nd trial while standing, adding a 2# resistive wrist weight to further strengthen the BUE while performing Bilateral Lateral, and 3pt. Pinch strengthening tasks to place clips onto horizontal dowel in multiple planes. -Pt. Worked on activities to facilitate midline orientation, with crossing midline removing resistive clips from a horizontal dowel, and placing them into a container on the tabletop surface to the left, and right. -tasks were performed in standing to improve standing tolerance, and standing balance.  PATIENT EDUCATION: Education details: BUE strengthening, funcyional reaching Person educated: Parent, pt Education method: Medical illustrator Education comprehension: verbalized  understanding, mother receptive  HOME EXERCISE PROGRAM:  Theraputty  GOALS: Goals reviewed with patient? Yes    SHORT TERM GOALS: Target date: 11/24/23  Pt will INDEPENDENTLY complete HEP  Baseline: 11/15/2023: Supervision, cues 10/14/23: no HEP issued Goal status: INITIAL  LONG TERM GOALS: Target date: 01/06/24  Pt will increase B strength by 15# to assist with gripping grab bar for safe transfers   Baseline: 11/15/23: Pt. Is able to grip  the grab bar. 10/14/23: L 15#, R 10#  Goal status:  progressing, ongoing  2.  Pt will INDEPENDENTLY transfer to regular height toilet  Baseline: 11/15/23: Modified Independent toileting at home, Supervision using the hospital clinic bathroom Eval: SUPERVISION + RW for elevated toilet, MIN A + RW for regular toilet Goal status: progressing, ongoing 3.  Pt will improve B FMC by decreasing 9 hole PEG test score by 10 seconds for participation in leisure activities (coloring).  Baseline: 11/15/23:Continue Eval: L 30, R 40 Goal status: Ongoing  4.  Pt will verbalize x3 strategies to manage anxiousness and fear of falling to reduce fall risk. Baseline: 11/15/23: Consistent cues required Eval: 0/3 strategies.  Goal status: Ongoing  5.  Pt will increase B lateral pinch strength by 5# to manage ADL items.  Baseline: Eval: Continue R 4#, L 5# Goal status: Ongoing  6. Pt. Will perform LE dressing with Supervision with A/E use. Baseline: Eval: MinA donning/doffing socks, ModA shoes Goal Status: Ongoing    ASSESSMENT:  CLINICAL IMPRESSION:  Pt. was present with  her mother, and an interpreter today. Pt. Tolerated 2# wrist weight bilaterally, during functional reaching and standing tolerance activity. Pt. Has improved with  standing balance during functional reaching tasks while challenged with crossing midline. Pt. Has difficulty with sustaining pinch, and fatigues after 2 repetitions of 3 pt. And lateral pinch. Pt. Requires Mod. Verbal cues for proper  technique and hand  placement while performing 3 pt and Lateral pinch using resistive clips. Pt. Required 4 rest breaks in between and during sets of functional reaching tasks while standing. When Pt. Fatigues, she intermittently switches from her RUE, to her LUE to place resistive clips onto horizontal dowel. Pt. continues to work on improving, and maximizing independence with ADLs, and IADLs. Pt. continues to benefit from OT services to improve BUE strength/dexterity and to provide ADL training in order to maximize indep with daily tasks.   PERFORMANCE DEFICITS: in functional skills including ADLs, coordination, strength, pain, and balance, cognitive skills including attention and problem solving, and psychosocial skills including coping strategies and environmental adaptation.   IMPAIRMENTS: are limiting patient from ADLs and leisure.   COMORBIDITIES: has co-morbidities such as Prader-Willi syndrome, intellectual delay that affect occupational performance. Patient will benefit from skilled OT to address above impairments and improve overall function.  MODIFICATION OR ASSISTANCE TO COMPLETE EVALUATION: Min-Moderate modification of tasks or assist with assess necessary to complete an evaluation.  OT OCCUPATIONAL PROFILE AND HISTORY: Detailed assessment: Review of records and additional review of physical, cognitive, psychosocial history related to current functional performance.  CLINICAL DECISION MAKING: Moderate - several treatment options, min-mod task modification necessary  REHAB POTENTIAL: Good  EVALUATION COMPLEXITY: Moderate      PLAN:  OT FREQUENCY: 1-2x/week  OT DURATION: 12 weeks  PLANNED INTERVENTIONS: 97535 self care/ADL training, 02889 therapeutic exercise, 97530 therapeutic activity, patient/family education, and DME and/or AE instructions  RECOMMENDED OTHER SERVICES: PT  CONSULTED AND AGREED WITH PLAN OF CARE: Patient and family member/caregiver  PLAN FOR NEXT  SESSION: ADLs   Damien Nap, OTS    12/28/2023, 4:47 PM

## 2023-12-28 NOTE — Therapy (Signed)
 OUTPATIENT PHYSICAL THERAPY LE TREATMENT   Patient Name: Linda Mason MRN: [id number] DOB:1996-04-27, 28 y.o., female Today's Date: 12/28/2023  END OF SESSION:  PT End of Session - 12/28/23 1052     Visit Number 17    Number of Visits 25    Date for PT Re-Evaluation 01/10/24    Progress Note Due on Visit 20    PT Start Time 1102    PT Stop Time 1145    PT Time Calculation (min) 43 min    Equipment Utilized During Treatment Gait belt    Activity Tolerance Patient tolerated treatment well;No increased pain    Behavior During Therapy [facility] for tasks assessed/performed           Past Medical History:  Diagnosis Date   Diabetes mellitus without complication [facility])    Past Surgical History:  Procedure Laterality Date   KNEE ARTHROSCOPY Right 08/27/2023   Procedure: ARTHROSCOPY KNEE;  Surgeon: Tobie Priest, MD;  Location: [facility] ORS;  Service: Orthopedics;  Laterality: Right;   Patient Active Problem List   Diagnosis Date Noted   Closed patellar dislocation, right, initial encounter 09/01/2023   Patellar dislocation 08/26/2023   Type 2 diabetes mellitus without complication, with long-term current use of insulin  (HCC) 08/26/2023   Hypoxia 08/26/2023   Morbid obesity (HCC) 10/09/2021   Diabetes mellitus with neuropathy (HCC) 10/09/2021   Essential hypertension 10/09/2021   Thrombocytopenia (HCC) 10/09/2021   Hypothyroidism 10/09/2021   Prader-Willi syndrome 10/09/2021   Cellulitis 10/08/2021     PCP: [facility]  REFERRING PROVIDER: Pegge Toribio PARAS, PA-C   REFERRING DIAG:  Diagnosis  S83.004A (ICD-10-CM) - Closed patellar dislocation, right, initial encounter    Rationale for Evaluation and Treatment: Rehabilitation  THERAPY DIAG:  Muscle weakness (generalized)  Unsteadiness on feet  Difficulty in walking, not elsewhere classified  ONSET DATE: 08/26/2023   SUBJECTIVE:                                                                                                                                                                                            SUBJECTIVE STATEMENT  Pt says she is doing well. She is still waiting for cardiac monitor results.  Pt has an upcoming MRI for her heart.. Pt now able to take a shower standing per mother's report.     From Eval: The pt is a pleasant 28 yo female. She presents in [facility] with her mom and an interpreter present for PT eval. Pt suffered a R knee dislocation following a fall 08/26/2023. Pt also found to have lateral femoral condyle impacting fx and medial patella impaction  fracture of RLE. She underwent arthroscopic assisted R patellar reduction with lateral release on 08/27/2023. Pt admitted to CIR from 09/02/23 - 09/15/23/ Pt just had follow-up with surgeon and has been instructed to wean from hinged knee brace/has been released from care; she is WBAT. Pt's mother reports pt using 2WW to ambulate for short distances: maybe ambulating 30-50 steps at most without brace. Pt reports ambulation is limited by R knee pain. Her knee hurts most when walking. She has difficulty completing STS but reports she can do this on her own, although not able to do this yet in her school's bathroom and would like to work on this. Pt was independent with ADLs prior to injury. Her mother is helping her now with dressing. She has not yet used steps to enter her home, is currently using her WC on ramp. They report no other falls. Pt wants to improve ability to ambulate for her school prom. She likes to watch movies.  Pt has Prader-Willi syndrome, intellectual delay.   PERTINENT HISTORY:   PMH also significant for DM II with neuropathy, HTN, thrombocytopenia, hypothyroidism, Prader-Willi syndrome.    PAIN:  Are you having pain? None currently, takes medicine for her knee pain; no other pain  PRECAUTIONS:  Knee - WBAT  RED FLAGS: None   WEIGHT BEARING RESTRICTIONS:  Via surgeon note 1. I recommended the  patient be weightbearing as tolerated and wean from the hinged knee brace.  FALLS:  Has patient fallen in last 6 months? No  LIVING ENVIRONMENT: Lives with: lives with their family in one level home Stairs: 2 steps, handrails bilat to enter home, but also has two ramps that she uses to enter home and is not currently using steps Has following equipment at home: RW  OCCUPATION:  Student   PLOF:  Independent  PATIENT GOALS:  Pt wants to walk to be able to go to school prom; she wants to be able to dance.  NEXT MD VISIT: Had follow-up today,  was released from care but can follow-up as needed.    OBJECTIVE:  Note: Objective measures were completed at Evaluation unless otherwise noted.  DIAGNOSTIC FINDINGS:  Via chart, note Tobie Earnestine Clamp, MD R Knee radiographs:  10/18/2023: 2 views (wheelchair bound lateral and oblique radiographs with suboptimal positioning) were obtained. Appropriate images cannot be obtained due to patient's hesitation/unwillingness to transfer to x-ray table. Within these limitations, there does not appear to be a recurrent patellar dislocation. There is significant osteopenia. Impaction fracture about the lateral femoral condyle is also visualized. It does not appear to fully involve the articular weightbearing surface.   PATIENT SURVEYS:  LEFS deferred  COGNITIVE STATUS: Impaired at baseline   SENSATION: WFL BLE  COORDINATION: WFL rapid alt LE, unable to do heel>shin likely limited by body habitus and some hesitance/faer of movement  EDEMA:  Pt R knee still with slight edema around patella, no redness present, scars healing well  POSTURE:  rounded shoulders, increased fwd posture/kyphosis possibly impacted by body habitus    GAIT: Distance walked: (see below) Assistive device utilized: Environmental consultant - 2 wheeled Level of assistance: CGA Comments: Decreased gait speed, antalgic gait, decreased weightbearing and stance time on RLE, heavy BUE  weightbearing through RW    Body Part #1 Knee  PALPATION: No pain with palpation superior, inferior, LTL patella, posterior knee RLE  LOWER EXTREMITY ROM:    Need to complete formal assessment, however pt exhibited to have impaired R hip flexion mobility with MMT,  possibly due positioning but needs further assessment.    LOWER EXTREMITY MMT:    MMT Right eval Left eval  Hip flexion 3- 3+  Hip extension    Hip abduction 4 4  Hip adduction 3* 4  Hip internal rotation    Hip external rotation    Knee flexion * 4  Knee extension 3* 4+  Ankle dorsiflexion 4 4  Ankle plantarflexion    Ankle inversion    Ankle eversion     (Blank rows = not tested) * indicates pain-limited. Pt also might be self-limiting due to fear of pain with movement     FUNCTIONAL TESTS:  5 times sit to stand: 42.5 sec heavy use of UE to RW  6 minute walk test: deferred tp future visit 10 meter walk test: 0.16 sec with BRW  Berg Balance Scale: deferred to future visit                                                                                    TREATMENT DATE:   PT applied gait belt. CGA from PT for safety throughout session.  TE& TA Sit stand without UE support x 12  UE support on rail:  - Standing March with Counter Support  - 10 reps - Mini Squat with Counter Support  - 12 reps - Standing Knee Flexion with Counter Support - 10 reps - Standing Hip Abduction with bilateral Counter Support   - 12 reps - Heel Toe Raises with Counter Support  - 12 reps  Seated heel raises 15x bilaterally   Gait without RW and 2# aw each LE 2x148 ft. Pt rates hard, requires seated rest break between sets   NMR:  Gait in // bars dynamic balance activities, multiple reps of the following FWD gait without UE support FWD gait without UE support/BCKWD with UE support LTL stepping (R and L sides) without UE support  PATIENT EDUCATION:  Education details: Pt educated throughout session about proper posture  and technique with exercises. Improved exercise technique, movement at target joints, use of target muscles after min to mod verbal, visual, tactile cues.  Person educated: Patient and Parent Education method: Explanation, Demonstration, Tactile cues, and Verbal cues Education comprehension: verbalized understanding, returned demonstration, verbal cues required, tactile cues required, and needs further education  HOME EXERCISE PROGRAM: Reviewed: safe technique, signs and symptoms to watch for that proceeding is OK, stop if feeling increased pain/any sharp pain in knee Access Code: [email or web address] URL: [email or web address] Date: 12/21/2023 Prepared by: Massie Dollar  Exercises - Sit to Stand with Counter Support  - 1 x daily - 7 x weekly - 2 sets - 5 reps - Standing March with Counter Support  - 1 x daily - 3-4 x weekly - 3 sets - 10 reps - Mini Squat with Counter Support  - 1 x daily - 7 x weekly - 3 sets - 10 reps - Standing Knee Flexion with Counter Support  - 1 x daily - 7 x weekly - 3 sets - 10 reps - Standing Hip Abduction with Unilateral Counter Support  - 1 x daily - 7 x weekly - 3 sets - 10 reps -  Heel Toe Raises with Counter Support  - 1 x daily - 7 x weekly - 3 sets - 10 reps    ASSESSMENT:  CLINICAL IMPRESSION: Chartered loss adjuster continued plan as laid out in recent sessions. Pt able to advance therex with increased volume, and she performed increasingly challenge balance activities in // bars without UE support. While pt making gains, she still exhibits quick fatigue with gait for distance and decreased stance time on RLE with decreased step-length. Pt will continue to benefit from skilled therapy to address remaining deficits in order to improve overall QoL and return to PLOF.       OBJECTIVE IMPAIRMENTS: Abnormal gait, decreased activity tolerance, decreased balance, decreased coordination, decreased endurance, decreased mobility, difficulty walking, decreased ROM, decreased  strength, increased edema, impaired flexibility, improper body mechanics, postural dysfunction, obesity, and pain.   ACTIVITY LIMITATIONS: carrying, lifting, bending, standing, squatting, stairs, transfers, toileting, dressing, and locomotion level  PARTICIPATION LIMITATIONS: meal prep, cleaning, laundry, shopping, community activity, yard work, and school  PERSONAL FACTORS: Fitness, Sex, and 3+ comorbidities: PMH significant for DM II with neuropathy, HTN, thrombocytopenia, hypothyroidism, Prader-Willi syndrome,  are also affecting patient's functional outcome.   REHAB POTENTIAL: Good  CLINICAL DECISION MAKING: Evolving/moderate complexity  EVALUATION COMPLEXITY: Moderate   GOALS: Goals reviewed with patient? Yes  SHORT TERM GOALS: Target date: 11/29/2023  Patient will be independent in home exercise program to improve strength/mobility for better functional independence with ADLs. Baseline: initiated Goal status: INITIAL    LONG TERM GOALS: Target date: 01/10/2024  Patient will increase lower extremity functional scale to >60/80 to demonstrate improved functional mobility and increased tolerance with ADLs. .  Baseline: deferred Goal status: INITIAL  2.  Patient (> 70 years old) will complete five times sit to stand test in < 15 seconds indicating an increased LE strength and improved balance. Baseline: 42.5 sec, heavy use of BUE 5/21: 14.27 sec.  Heavy use of BUE on RW  Goal status: IN PROGRESS  3.  Patient will increase Berg Balance score by > 6 points to demonstrate decreased fall risk during functional activities Baseline: deferred to future session, may need to complete different balance test if pt has difficulty with cuing for test activities  5/21: 33/56 Goal status: IN PROGRESS  4.  Patient will increase 10 meter walk test to >1.93m/s as to improve gait speed for better community ambulation and to reduce fall risk. Baseline: 0.16 with BRW 5/21: 0.23m/s with RW   12/02/2023= 0.31 m/s with RW Goal status: IN PROGRESS  5.  Patient will increase six minute walk test distance to >1000 for progression to community ambulator and improve gait ability. Baseline:  280 ft with RW 5/21: 322ft with RW  Goal status: IN PROGRESS    PLAN:  PT FREQUENCY: 1-2x/week  PT DURATION: 12 weeks  PLANNED INTERVENTIONS: 97164- PT Re-evaluation, 97750- Physical Performance Testing, 97110-Therapeutic exercises, 97530- Therapeutic activity, [id number]- Neuromuscular re-education, 97535- Self Care, 02859- Manual therapy, [phone]- Gait training, [phone]- Orthotic Initial, [phone]- Orthotic/Prosthetic subsequent, [phone]- Canalith repositioning, [id number]- Splinting, [id number]- Electrical stimulation (unattended), [phone]- Electrical stimulation (manual), [id number]- Ultrasound, Patient/Family education, Balance training, Stair training, Taping, Joint mobilization, Joint manipulation, Spinal mobilization, Scar mobilization, Vestibular training, DME instructions, Wheelchair mobility training, Cryotherapy, and Moist heat.  PLAN FOR NEXT SESSION:  - Continue with  RLE muscle strengthening - Continue with more weight bearing activities for RLE with decreased UE support- Consider using blaze pods Continue with overall static and dynamic balance    Darryle Patten PT, DPT  Physical Therapist - [facility]  [facility]  11:48 AM 12/28/23

## 2023-12-30 ENCOUNTER — Ambulatory Visit: Payer: MEDICAID | Admitting: Occupational Therapy

## 2023-12-30 ENCOUNTER — Ambulatory Visit: Payer: MEDICAID

## 2023-12-30 DIAGNOSIS — R262 Difficulty in walking, not elsewhere classified: Secondary | ICD-10-CM

## 2023-12-30 DIAGNOSIS — M6281 Muscle weakness (generalized): Secondary | ICD-10-CM

## 2023-12-30 DIAGNOSIS — R278 Other lack of coordination: Secondary | ICD-10-CM

## 2023-12-30 NOTE — Therapy (Signed)
 OUTPATIENT PHYSICAL THERAPY LE TREATMENT   Patient Name: Linda Mason MRN: [id number] DOB:[birth year], 28 y.o., female Today's Date: 12/30/2023  END OF SESSION:  PT End of Session - 12/30/23 0921     Visit Number 18    Number of Visits 25    Date for PT Re-Evaluation 01/10/24    Progress Note Due on Visit 20    PT Start Time 0932    PT Stop Time 1011    PT Time Calculation (min) 39 min    Equipment Utilized During Treatment Gait belt    Activity Tolerance Patient tolerated treatment well;No increased pain    Behavior During Therapy [facility] for tasks assessed/performed           Past Medical History:  Diagnosis Date   Diabetes mellitus without complication [facility])    Past Surgical History:  Procedure Laterality Date   KNEE ARTHROSCOPY Right 08/27/2023   Procedure: ARTHROSCOPY KNEE;  Surgeon: Tobie Priest, MD;  Location: [facility] ORS;  Service: Orthopedics;  Laterality: Right;   Patient Active Problem List   Diagnosis Date Noted   Closed patellar dislocation, right, initial encounter 09/01/2023   Patellar dislocation 08/26/2023   Type 2 diabetes mellitus without complication, with long-term current use of insulin  (HCC) 08/26/2023   Hypoxia 08/26/2023   Morbid obesity (HCC) 10/09/2021   Diabetes mellitus with neuropathy (HCC) 10/09/2021   Essential hypertension 10/09/2021   Thrombocytopenia (HCC) 10/09/2021   Hypothyroidism 10/09/2021   Prader-Willi syndrome 10/09/2021   Cellulitis 10/08/2021     [facility]P: [facility]  REFERRING PROVIDER: Pegge Toribio PARAS, PA-C   REFERRING DIAG:  Diagnosis  S83.004A (ICD-10-CM) - Closed patellar dislocation, right, initial encounter    Rationale for Evaluation and Treatment: Rehabilitation  THERAPY DIAG:  Muscle weakness (generalized)  Difficulty in walking, not elsewhere classified  Other lack of coordination  ONSET DATE: 08/26/2023   SUBJECTIVE:                                                                                                                                                                                            SUBJECTIVE STATEMENT  Pt reports no changes. She's excited because she has a pizza party today.  From Eval: The pt is a pleasant 28 yo female. She presents in [facility] with her mom and an interpreter present for PT eval. Pt suffered a R knee dislocation following a fall 08/26/2023. Pt also found to have lateral femoral condyle impacting fx and medial patella impaction fracture of RLE. She underwent arthroscopic assisted R patellar reduction with lateral release on 08/27/2023. Pt admitted to CIR from 09/02/23 - 09/15/23/  Pt just had follow-up with surgeon and has been instructed to wean from hinged knee brace/has been released from care; she is WBAT. Pt's mother reports pt using 2WW to ambulate for short distances: maybe ambulating 30-50 steps at most without brace. Pt reports ambulation is limited by R knee pain. Her knee hurts most when walking. She has difficulty completing STS but reports she can do this on her own, although not able to do this yet in her school's bathroom and would like to work on this. Pt was independent with ADLs prior to injury. Her mother is helping her now with dressing. She has not yet used steps to enter her home, is currently using her WC on ramp. They report no other falls. Pt wants to improve ability to ambulate for her school prom. She likes to watch movies.  Pt has Prader-Willi syndrome, intellectual delay.   PERTINENT HISTORY:   PMH also significant for DM II with neuropathy, HTN, thrombocytopenia, hypothyroidism, Prader-Willi syndrome.    PAIN:  Are you having pain? None currently, takes medicine for her knee pain; no other pain  PRECAUTIONS:  Knee - WBAT  RED FLAGS: None   WEIGHT BEARING RESTRICTIONS:  Via surgeon note 1. I recommended the patient be weightbearing as tolerated and wean from the hinged knee brace.  FALLS:  Has patient  fallen in last 6 months? No  LIVING ENVIRONMENT: Lives with: lives with their family in one level home Stairs: 2 steps, handrails bilat to enter home, but also has two ramps that she uses to enter home and is not currently using steps Has following equipment at home: RW  OCCUPATION:  Student   PLOF:  Independent  PATIENT GOALS:  Pt wants to walk to be able to go to school prom; she wants to be able to dance.  NEXT MD VISIT: Had follow-up today,  was released from care but can follow-up as needed.    OBJECTIVE:  Note: Objective measures were completed at Evaluation unless otherwise noted.  DIAGNOSTIC FINDINGS:  Via chart, note Tobie Earnestine Clamp, MD R Knee radiographs:  10/18/2023: 2 views (wheelchair bound lateral and oblique radiographs with suboptimal positioning) were obtained. Appropriate images cannot be obtained due to patient's hesitation/unwillingness to transfer to x-ray table. Within these limitations, there does not appear to be a recurrent patellar dislocation. There is significant osteopenia. Impaction fracture about the lateral femoral condyle is also visualized. It does not appear to fully involve the articular weightbearing surface.   PATIENT SURVEYS:  LEFS deferred  COGNITIVE STATUS: Impaired at baseline   SENSATION: WFL BLE  COORDINATION: WFL rapid alt LE, unable to do heel>shin likely limited by body habitus and some hesitance/faer of movement  EDEMA:  Pt R knee still with slight edema around patella, no redness present, scars healing well  POSTURE:  rounded shoulders, increased fwd posture/kyphosis possibly impacted by body habitus    GAIT: Distance walked: (see below) Assistive device utilized: Environmental consultant - 2 wheeled Level of assistance: CGA Comments: Decreased gait speed, antalgic gait, decreased weightbearing and stance time on RLE, heavy BUE weightbearing through RW    Body Part #1 Knee  PALPATION: No pain with palpation superior,  inferior, LTL patella, posterior knee RLE  LOWER EXTREMITY ROM:    Need to complete formal assessment, however pt exhibited to have impaired R hip flexion mobility with MMT, possibly due positioning but needs further assessment.    LOWER EXTREMITY MMT:    MMT Right eval Left eval  Hip  flexion 3- 3+  Hip extension    Hip abduction 4 4  Hip adduction 3* 4  Hip internal rotation    Hip external rotation    Knee flexion * 4  Knee extension 3* 4+  Ankle dorsiflexion 4 4  Ankle plantarflexion    Ankle inversion    Ankle eversion     (Blank rows = not tested) * indicates pain-limited. Pt also might be self-limiting due to fear of pain with movement     FUNCTIONAL TESTS:  5 times sit to stand: 42.5 sec heavy use of UE to RW  6 minute walk test: deferred tp future visit 10 meter walk test: 0.16 sec with BRW  Berg Balance Scale: deferred to future visit                                                                                    TREATMENT DATE:   PT applied gait belt. CGA from PT for safety throughout session.  TA Gait with RW and 2# aw each LE 2x148 ft. requires seated rest break between sets   Circuit training: with 2# aw donned for first and second round, three rounds completed  Seated March 20x STS 10x Heel raise 20x Gait with RW and 2# aw each LE 1x148 ft. Pt rates hard, requires seated rest break between sets   NMR: multiple reps of the following // bars  FWD gait with turns and no UE support FWD gait with dual motor task (don't drop the ice cream) with turns -then repeats with LTL stepping and dual motor task R and L sides   Gait back to chair x 70 ft without AD, with CGA    PATIENT EDUCATION:  Education details: Pt educated throughout session about proper posture and technique with exercises. Improved exercise technique, movement at target joints, use of target muscles after min to mod verbal, visual, tactile cues.  Person educated: Patient and  Parent Education method: Explanation, Demonstration, Tactile cues, and Verbal cues Education comprehension: verbalized understanding, returned demonstration, verbal cues required, tactile cues required, and needs further education  HOME EXERCISE PROGRAM: Reviewed: safe technique, signs and symptoms to watch for that proceeding is OK, stop if feeling increased pain/any sharp pain in knee Access Code: [email or web address] URL: [email or web address] Date: 12/21/2023 Prepared by: Massie Dollar  Exercises - Sit to Stand with Counter Support  - 1 x daily - 7 x weekly - 2 sets - 5 reps - Standing March with Counter Support  - 1 x daily - 3-4 x weekly - 3 sets - 10 reps - Mini Squat with Counter Support  - 1 x daily - 7 x weekly - 3 sets - 10 reps - Standing Knee Flexion with Counter Support  - 1 x daily - 7 x weekly - 3 sets - 10 reps - Standing Hip Abduction with Unilateral Counter Support  - 1 x daily - 7 x weekly - 3 sets - 10 reps - Heel Toe Raises with Counter Support  - 1 x daily - 7 x weekly - 3 sets - 10 reps    ASSESSMENT:  CLINICAL IMPRESSION: Pt exhibits ability to ambulate without UE support  and with dual motor task for short distances in // bars. This indicates improved balance and gait ability. PT does still recommend that pt use AD for distance until balance and endurance further improves. Pt will continue to benefit from skilled therapy to address remaining deficits in order to improve overall QoL and return to PLOF.       OBJECTIVE IMPAIRMENTS: Abnormal gait, decreased activity tolerance, decreased balance, decreased coordination, decreased endurance, decreased mobility, difficulty walking, decreased ROM, decreased strength, increased edema, impaired flexibility, improper body mechanics, postural dysfunction, obesity, and pain.   ACTIVITY LIMITATIONS: carrying, lifting, bending, standing, squatting, stairs, transfers, toileting, dressing, and locomotion  level  PARTICIPATION LIMITATIONS: meal prep, cleaning, laundry, shopping, community activity, yard work, and school  PERSONAL FACTORS: Fitness, Sex, and 3+ comorbidities: PMH significant for DM II with neuropathy, HTN, thrombocytopenia, hypothyroidism, Prader-Willi syndrome,  are also affecting patient's functional outcome.   REHAB POTENTIAL: Good  CLINICAL DECISION MAKING: Evolving/moderate complexity  EVALUATION COMPLEXITY: Moderate   GOALS: Goals reviewed with patient? Yes  SHORT TERM GOALS: Target date: 11/29/2023  Patient will be independent in home exercise program to improve strength/mobility for better functional independence with ADLs. Baseline: initiated Goal status: INITIAL    LONG TERM GOALS: Target date: 01/10/2024  Patient will increase lower extremity functional scale to >60/80 to demonstrate improved functional mobility and increased tolerance with ADLs. .  Baseline: deferred Goal status: INITIAL  2.  Patient (> 65 years old) will complete five times sit to stand test in < 15 seconds indicating an increased LE strength and improved balance. Baseline: 42.5 sec, heavy use of BUE 5/21: 14.27 sec.  Heavy use of BUE on RW  Goal status: IN PROGRESS  3.  Patient will increase Berg Balance score by > 6 points to demonstrate decreased fall risk during functional activities Baseline: deferred to future session, may need to complete different balance test if pt has difficulty with cuing for test activities  5/21: 33/56 Goal status: IN PROGRESS  4.  Patient will increase 10 meter walk test to >1.61m/s as to improve gait speed for better community ambulation and to reduce fall risk. Baseline: 0.16 with BRW 5/21: 0.82m/s with RW  12/02/2023= 0.31 m/s with RW Goal status: IN PROGRESS  5.  Patient will increase six minute walk test distance to >1000 for progression to community ambulator and improve gait ability. Baseline:  280 ft with RW 5/21: 31ft with RW  Goal status:  IN PROGRESS    PLAN:  PT FREQUENCY: 1-2x/week  PT DURATION: 12 weeks  PLANNED INTERVENTIONS: [id number]- PT Re-evaluation, 97750- Physical Performance Testing, 97110-Therapeutic exercises, 97530- Therapeutic activity, [id number]- Neuromuscular re-education, 97535- Self Care, [id number]- Manual therapy, [phone]- Gait training, [phone]- Orthotic Initial, [phone]- Orthotic/Prosthetic subsequent, [phone]- Canalith repositioning, [id number]- Splinting, [id number]- Electrical stimulation (unattended), [phone]- Electrical stimulation (manual), [id number]- Ultrasound, Patient/Family education, Balance training, Stair training, Taping, Joint mobilization, Joint manipulation, Spinal mobilization, Scar mobilization, Vestibular training, DME instructions, Wheelchair mobility training, Cryotherapy, and Moist heat.  PLAN FOR NEXT SESSION:  - Continue with  RLE muscle strengthening - Continue with more weight bearing activities for RLE with decreased UE support- Consider using blaze pods Continue with overall static and dynamic balance    Darryle Patten PT, DPT  Physical Therapist - [facility]  12:43 PM 12/30/23

## 2023-12-30 NOTE — Therapy (Signed)
 Occupational Therapy Ortho Treatment Note  Patient Name: Linda Mason MRN: [id number] DOB:August 25, 1995, 28 y.o., female Today's Date: 12/30/2023  [facility]P: [facility] REFERRING PROVIDER: Toribio Pitch  END OF SESSION:  OT End of Session - 12/30/23 2222     Visit Number 19    Number of Visits 24    Date for OT Re-Evaluation 01/06/24    OT Start Time 1015    OT Stop Time 1100    OT Time Calculation (min) 45 min    Equipment Utilized During Treatment RW    Activity Tolerance Patient tolerated treatment well    Behavior During Therapy [facility] for tasks assessed/performed          Past Medical History:  Diagnosis Date   Diabetes mellitus without complication [facility])    Past Surgical History:  Procedure Laterality Date   KNEE ARTHROSCOPY Right 08/27/2023   Procedure: ARTHROSCOPY KNEE;  Surgeon: Tobie Priest, MD;  Location: [facility] ORS;  Service: Orthopedics;  Laterality: Right;   Patient Active Problem List   Diagnosis Date Noted   Closed patellar dislocation, right, initial encounter 09/01/2023   Patellar dislocation 08/26/2023   Type 2 diabetes mellitus without complication, with long-term current use of insulin  (HCC) 08/26/2023   Hypoxia 08/26/2023   Morbid obesity (HCC) 10/09/2021   Diabetes mellitus with neuropathy (HCC) 10/09/2021   Essential hypertension 10/09/2021   Thrombocytopenia (HCC) 10/09/2021   Hypothyroidism 10/09/2021   Prader-Willi syndrome 10/09/2021   Cellulitis 10/08/2021   ONSET DATE: 08/26/2023  REFERRING DIAG: Right knee patella fracture  THERAPY DIAG:  Muscle weakness (generalized)  Rationale for Evaluation and Treatment: Rehabilitation  SUBJECTIVE:   SUBJECTIVE STATEMENT:  Pt. Reports being excited about having a pizza party at school today.  Pt accompanied by: Interpreter:  Mother, Spanish speaking interpreter  PERTINENT HISTORY: Linda Mason is a 28 year old right-handed morbidly obese female and BMI 44.88  as well as diabetes mellitus, Prader-Willi syndrome, intellectual delay. Presented to [facility] ED on 08/26/2023 after mechanical fall getting out of her bed. X-rays and imaging revealed right knee irreducible patellar dislocation right knee lateral femoral condyle impacting fracture as well as right knee medial patella impaction fracture. Pt underwent right knee arthroscopic assisted patellar reduction with lateral release on 08/27/2023 per Dr. Priest Tobie.  Weightbearing as tolerated right lower extremity knee immobilizer when out of bed or when walking. Pt admitted to CIR from 09/02/23 - 09/15/23  PRECAUTIONS: Knee , Knee Immobilizer    WEIGHT BEARING RESTRICTIONS: Yes WBAT  PAIN:  Are you having pain? Yes: NPRS scale: 0/10 Pain location: knee  FALLS: Has patient fallen in last 6 months? No  LIVING ENVIRONMENT: Lives with: lives with their family Lives in: House/apartment Stairs: No ramp Has following equipment at home: Wheelchair (manual)  PLOF: Independent with basic ADLs  PATIENT GOALS: to return to school and go to [employer] with no fear of falling   NEXT MD VISIT: none  OBJECTIVE:  Note: Objective measures were completed at Evaluation unless otherwise noted.  HAND DOMINANCE: Left  ADLs: Transfers/ambulation related to ADLs: SUPERVISION short distances using RW, MOD I using w/c Eating: IND Grooming: IND Upper body dressing: SETUP assist Lower body dressing: MAX A Toileting: SUPERVISION Bathing: MOD A bathing   Tub shower transfers: SUPERVISION Equipment: Shower seat with back, Walk in shower, and bed side commode  UPPER EXTREMITY ROM:     Active ROM Right  WFL Left  WFL    UPPER EXTREMITY MMT:   Question accuracy  given difficulty following commands   MMT Right eval Left eval  Shoulder flexion 3+/5 3+/5  Shoulder abduction    Elbow flexion 3+/5 3+/5  Elbow extension 3+/5 3+/5  Wrist flexion    Wrist extension    (Blank rows = not tested)  HAND FUNCTION: Grip  strength: Right: 10 lbs; Left: 15 lbs, Lateral pinch: Right: 4 lbs, Left: 5 lbs, and 3 point pinch: Right: 1 lbs, Left: 1 lbs  COORDINATION: 9 Hole Peg test: Right: 40 sec; Left: 30 sec  SENSATION: Light touch: Impaired  tingling  EDEMA: none  COGNITION: Overall cognitive status: History of cognitive impairments - at baseline Areas of impairment: Areas of impairment: Problem solving  Commands: Follows one step commands with increased time Behavior: Perseveration and anxious  OBSERVATIONS: Pt appears anxious and looking toward her mother for answers to questions.    TREATMENT DATE: 12/30/23  Therapeutic Exercise:  -Performed BUE strengthening using a 3# dowel ex while sitting: Bilateral shoulder flexion, chest press, circular patterns, and elbow flexion/extension for 2 sets 10-20 reps each.  -Performed BUE strengthening while sitting using 2# hand weight for wrist flexion, extension, and forearm supination/pronation.  Therapeutic Activities:   -Progressive gross grip strengthening with 11.2# adjusted to 6.6# of grip strength resistive force gripping, and removing the jumbo pegs from the pegboard. -Pt. worked on translatory movements movements grasping the objects, and storing them in the palm of the hand. Pt. then worked on moving them through the hand from the palm to the tip of the 2nd digit, and thumb in preparation for placing them into a pegboard placed vertically to promote wrist extension.  Neuromuscular re-education:   -Pt. Worked on using resistive tweezers to sort 1/16th multicolored cubes by color into shallow dishes. To promote sustained pinch grasp on the tweezers. Pt. Then worked on using the [employer] to sort 1/8 flat circular buttons.   PATIENT EDUCATION: Education details: BUE strengthening, funcyional reaching Person educated: Parent, pt Education method: Medical illustrator Education comprehension: verbalized understanding, mother  receptive  HOME EXERCISE PROGRAM:  Theraputty  GOALS: Goals reviewed with patient? Yes    SHORT TERM GOALS: Target date: 11/24/23  Pt will INDEPENDENTLY complete HEP  Baseline: 11/15/2023: Supervision, cues 10/14/23: no HEP issued Goal status: INITIAL  LONG TERM GOALS: Target date: 01/06/24  Pt will increase B strength by 15# to assist with gripping grab bar for safe transfers   Baseline: 11/15/23: Pt. Is able to grip  the grab bar. 10/14/23: L 15#, R 10#  Goal status:  progressing, ongoing  2.  Pt will INDEPENDENTLY transfer to regular height toilet  Baseline: 11/15/23: Modified Independent toileting at home, Supervision using the hospital clinic bathroom Eval: SUPERVISION + RW for elevated toilet, MIN A + RW for regular toilet Goal status: progressing, ongoing 3.  Pt will improve B [facility] by decreasing 9 hole PEG test score by 10 seconds for participation in leisure activities (coloring).  Baseline: 11/15/23:Continue Eval: L 30, R 40 Goal status: Ongoing  4.  Pt will verbalize x3 strategies to manage anxiousness and fear of falling to reduce fall risk. Baseline: 11/15/23: Consistent cues required Eval: 0/3 strategies.  Goal status: Ongoing  5.  Pt will increase B lateral pinch strength by 5# to manage ADL items.  Baseline: Eval: Continue R 4#, L 5# Goal status: Ongoing  6. Pt. Will perform LE dressing with Supervision with A/E use. Baseline: Eval: MinA donning/doffing socks, ModA shoes Goal Status: Ongoing    ASSESSMENT:  CLINICAL  IMPRESSION:  Pt. was present with her mother, and an interpreter today.  Pt.'s mother reports that she is waiting to hear the results from the tests the patient had after following up with the cardiologist. Pt. Fatigues when challenged with tasks in standing today. Pt. has difficulty with using the resistive tweezers to manipulate the extra small cubes, and flat circular buttons. Pt. continues to benefit from OT services to improve BUE strength/dexterity  and to provide ADL training in order to maximize indep with daily tasks.   PERFORMANCE DEFICITS: in functional skills including ADLs, coordination, strength, pain, and balance, cognitive skills including attention and problem solving, and psychosocial skills including coping strategies and environmental adaptation.   IMPAIRMENTS: are limiting patient from ADLs and leisure.   COMORBIDITIES: has co-morbidities such as Prader-Willi syndrome, intellectual delay that affect occupational performance. Patient will benefit from skilled OT to address above impairments and improve overall function.  MODIFICATION OR ASSISTANCE TO COMPLETE EVALUATION: Min-Moderate modification of tasks or assist with assess necessary to complete an evaluation.  OT OCCUPATIONAL PROFILE AND HISTORY: Detailed assessment: Review of records and additional review of physical, cognitive, psychosocial history related to current functional performance.  CLINICAL DECISION MAKING: Moderate - several treatment options, min-mod task modification necessary  REHAB POTENTIAL: Good  EVALUATION COMPLEXITY: Moderate      PLAN:  OT FREQUENCY: 1-2x/week  OT DURATION: 12 weeks  PLANNED INTERVENTIONS: 97535 self care/ADL training, 02889 therapeutic exercise, 97530 therapeutic activity, patient/family education, and DME and/or AE instructions  RECOMMENDED OTHER SERVICES: PT  CONSULTED AND AGREED WITH PLAN OF CARE: Patient and family member/caregiver  PLAN FOR NEXT SESSION: ADLs   Damien Nap, OTS   This entire session was performed under direct supervision and direction of a licensed therapist/therapist assistant . I have personally read, edited and approve of the note as written.   Richardson Otter, MS, OTR/L    12/30/2023, 10:26 PM

## 2024-01-04 ENCOUNTER — Ambulatory Visit: Payer: MEDICAID | Attending: Physician Assistant | Admitting: Occupational Therapy

## 2024-01-04 ENCOUNTER — Ambulatory Visit: Payer: MEDICAID

## 2024-01-04 DIAGNOSIS — R2681 Unsteadiness on feet: Secondary | ICD-10-CM | POA: Insufficient documentation

## 2024-01-04 DIAGNOSIS — R262 Difficulty in walking, not elsewhere classified: Secondary | ICD-10-CM | POA: Insufficient documentation

## 2024-01-04 DIAGNOSIS — G8929 Other chronic pain: Secondary | ICD-10-CM | POA: Insufficient documentation

## 2024-01-04 DIAGNOSIS — M25561 Pain in right knee: Secondary | ICD-10-CM | POA: Insufficient documentation

## 2024-01-04 DIAGNOSIS — R278 Other lack of coordination: Secondary | ICD-10-CM | POA: Insufficient documentation

## 2024-01-04 DIAGNOSIS — S83004A Unspecified dislocation of right patella, initial encounter: Secondary | ICD-10-CM | POA: Insufficient documentation

## 2024-01-04 DIAGNOSIS — M6281 Muscle weakness (generalized): Secondary | ICD-10-CM | POA: Insufficient documentation

## 2024-01-06 ENCOUNTER — Ambulatory Visit: Payer: MEDICAID | Admitting: Occupational Therapy

## 2024-01-06 ENCOUNTER — Ambulatory Visit: Payer: MEDICAID

## 2024-01-06 DIAGNOSIS — R278 Other lack of coordination: Secondary | ICD-10-CM | POA: Diagnosis present

## 2024-01-06 DIAGNOSIS — R262 Difficulty in walking, not elsewhere classified: Secondary | ICD-10-CM

## 2024-01-06 DIAGNOSIS — M25561 Pain in right knee: Secondary | ICD-10-CM | POA: Diagnosis present

## 2024-01-06 DIAGNOSIS — S83004A Unspecified dislocation of right patella, initial encounter: Secondary | ICD-10-CM | POA: Diagnosis present

## 2024-01-06 DIAGNOSIS — M6281 Muscle weakness (generalized): Secondary | ICD-10-CM

## 2024-01-06 DIAGNOSIS — R2681 Unsteadiness on feet: Secondary | ICD-10-CM

## 2024-01-06 DIAGNOSIS — G8929 Other chronic pain: Secondary | ICD-10-CM | POA: Diagnosis present

## 2024-01-06 NOTE — Therapy (Signed)
 OUTPATIENT PHYSICAL THERAPY LE TREATMENT   Patient Name: Linda Mason MRN: [id number] DOB:10-22-1995, 28 y.o., female Today's Date: 01/06/2024  END OF SESSION:  PT End of Session - 01/06/24 0926     Visit Number 19    Number of Visits 25    Date for PT Re-Evaluation 01/10/24    Progress Note Due on Visit 20    PT Start Time 0930    PT Stop Time 1012    PT Time Calculation (min) 42 min    Equipment Utilized During Treatment Gait belt    Activity Tolerance Patient tolerated treatment well;No increased pain    Behavior During Therapy [facility] for tasks assessed/performed            Past Medical History:  Diagnosis Date   Diabetes mellitus without complication [facility])    Past Surgical History:  Procedure Laterality Date   KNEE ARTHROSCOPY Right 08/27/2023   Procedure: ARTHROSCOPY KNEE;  Surgeon: Tobie Priest, MD;  Location: [facility] ORS;  Service: Orthopedics;  Laterality: Right;   Patient Active Problem List   Diagnosis Date Noted   Closed patellar dislocation, right, initial encounter 09/01/2023   Patellar dislocation 08/26/2023   Type 2 diabetes mellitus without complication, with long-term current use of insulin  (HCC) 08/26/2023   Hypoxia 08/26/2023   Morbid obesity (HCC) 10/09/2021   Diabetes mellitus with neuropathy (HCC) 10/09/2021   Essential hypertension 10/09/2021   Thrombocytopenia (HCC) 10/09/2021   Hypothyroidism 10/09/2021   Prader-Willi syndrome 10/09/2021   Cellulitis 10/08/2021     PCP: [facility]  REFERRING PROVIDER: Pegge Toribio PARAS, PA-C   REFERRING DIAG:  Diagnosis  S83.004A (ICD-10-CM) - Closed patellar dislocation, right, initial encounter    Rationale for Evaluation and Treatment: Rehabilitation  THERAPY DIAG:  Muscle weakness (generalized)  Difficulty in walking, not elsewhere classified  Other lack of coordination  Unsteadiness on feet  ONSET DATE: 08/26/2023   SUBJECTIVE:                                                                                                                                                                                            SUBJECTIVE STATEMENT  Pt reports that her pizza party went well. Pt states that she was able to go up the stairs at home with her brother helping her. Pt missed previous appointment due to her uncle passing away.  From Eval: The pt is a pleasant 28 yo female. She presents in [facility] with her mom and an interpreter present for PT eval. Pt suffered a R knee dislocation following a fall 08/26/2023. Pt also found to have lateral femoral condyle impacting  fx and medial patella impaction fracture of RLE. She underwent arthroscopic assisted R patellar reduction with lateral release on 08/27/2023. Pt admitted to CIR from 09/02/23 - 09/15/23/ Pt just had follow-up with surgeon and has been instructed to wean from hinged knee brace/has been released from care; she is WBAT. Pt's mother reports pt using 2WW to ambulate for short distances: maybe ambulating 30-50 steps at most without brace. Pt reports ambulation is limited by R knee pain. Her knee hurts most when walking. She has difficulty completing STS but reports she can do this on her own, although not able to do this yet in her school's bathroom and would like to work on this. Pt was independent with ADLs prior to injury. Her mother is helping her now with dressing. She has not yet used steps to enter her home, is currently using her WC on ramp. They report no other falls. Pt wants to improve ability to ambulate for her school prom. She likes to watch movies.  Pt has Prader-Willi syndrome, intellectual delay.   PERTINENT HISTORY:   PMH also significant for DM II with neuropathy, HTN, thrombocytopenia, hypothyroidism, Prader-Willi syndrome.    PAIN:  Are you having pain? None currently, takes medicine for her knee pain; no other pain  PRECAUTIONS:  Knee - WBAT  RED FLAGS: None   WEIGHT BEARING RESTRICTIONS:   Via surgeon note 1. I recommended the patient be weightbearing as tolerated and wean from the hinged knee brace.  FALLS:  Has patient fallen in last 6 months? No  LIVING ENVIRONMENT: Lives with: lives with their family in one level home Stairs: 2 steps, handrails bilat to enter home, but also has two ramps that she uses to enter home and is not currently using steps Has following equipment at home: RW  OCCUPATION:  Student   PLOF:  Independent  PATIENT GOALS:  Pt wants to walk to be able to go to school prom; she wants to be able to dance.  NEXT MD VISIT: Had follow-up today,  was released from care but can follow-up as needed.    OBJECTIVE:  Note: Objective measures were completed at Evaluation unless otherwise noted.  DIAGNOSTIC FINDINGS:  Via chart, note Tobie Earnestine Clamp, MD R Knee radiographs:  10/18/2023: 2 views (wheelchair bound lateral and oblique radiographs with suboptimal positioning) were obtained. Appropriate images cannot be obtained due to patient's hesitation/unwillingness to transfer to x-ray table. Within these limitations, there does not appear to be a recurrent patellar dislocation. There is significant osteopenia. Impaction fracture about the lateral femoral condyle is also visualized. It does not appear to fully involve the articular weightbearing surface.   PATIENT SURVEYS:  LEFS deferred  COGNITIVE STATUS: Impaired at baseline   SENSATION: WFL BLE  COORDINATION: WFL rapid alt LE, unable to do heel>shin likely limited by body habitus and some hesitance/faer of movement  EDEMA:  Pt R knee still with slight edema around patella, no redness present, scars healing well  POSTURE:  rounded shoulders, increased fwd posture/kyphosis possibly impacted by body habitus    GAIT: Distance walked: (see below) Assistive device utilized: Environmental consultant - 2 wheeled Level of assistance: CGA Comments: Decreased gait speed, antalgic gait, decreased  weightbearing and stance time on RLE, heavy BUE weightbearing through RW    Body Part #1 Knee  PALPATION: No pain with palpation superior, inferior, LTL patella, posterior knee RLE  LOWER EXTREMITY ROM:    Need to complete formal assessment, however pt exhibited to have impaired R  hip flexion mobility with MMT, possibly due positioning but needs further assessment.    LOWER EXTREMITY MMT:    MMT Right eval Left eval  Hip flexion 3- 3+  Hip extension    Hip abduction 4 4  Hip adduction 3* 4  Hip internal rotation    Hip external rotation    Knee flexion * 4  Knee extension 3* 4+  Ankle dorsiflexion 4 4  Ankle plantarflexion    Ankle inversion    Ankle eversion     (Blank rows = not tested) * indicates pain-limited. Pt also might be self-limiting due to fear of pain with movement     FUNCTIONAL TESTS:  5 times sit to stand: 42.5 sec heavy use of UE to RW  6 minute walk test: deferred tp future visit 10 meter walk test: 0.16 sec with BRW  Berg Balance Scale: deferred to future visit                                                                                    TREATMENT DATE:   PT applied gait belt. CGA from PT for safety throughout session.  TA Gait with RW and 2# aw each LE 2x148 ft  Gait to/from chair to // bars without AD x2   Seated: - Marches with 2# AW x multiple reps each LE - LAQ with 2# AW-x multiple reps cues for eccentric control, external cue provided for full arc of motion of L LE - STS x15- cues for upright posture  Additional lap into/out of clinic with 2# AW each LE 159ft  NMR: multiple reps of the following // bars  FWD gait with turns and no UE support FWD gait with dual-motor task (don't drop the ice cream) Hedgehog toe taps- 6 hedgehogs lined up along length of // bars, SPT calls out color of hedgehog and pt must walk to it and tap with foot, retrograde walking required for task   PATIENT EDUCATION:  Education details: Pt educated  throughout session about proper posture and technique with exercises. Improved exercise technique, movement at target joints, use of target muscles after min to mod verbal, visual, tactile cues.  Person educated: Patient and Parent Education method: Explanation, Demonstration, Tactile cues, and Verbal cues Education comprehension: verbalized understanding, returned demonstration, verbal cues required, tactile cues required, and needs further education  HOME EXERCISE PROGRAM: Reviewed: safe technique, signs and symptoms to watch for that proceeding is OK, stop if feeling increased pain/any sharp pain in knee Access Code: [email or web address] URL: [email or web address] Date: 12/21/2023 Prepared by: Massie Dollar  Exercises - Sit to Stand with Counter Support  - 1 x daily - 7 x weekly - 2 sets - 5 reps - Standing March with Counter Support  - 1 x daily - 3-4 x weekly - 3 sets - 10 reps - Mini Squat with Counter Support  - 1 x daily - 7 x weekly - 3 sets - 10 reps - Standing Knee Flexion with Counter Support  - 1 x daily - 7 x weekly - 3 sets - 10 reps - Standing Hip Abduction with Unilateral Counter Support  - 1 x daily - 7 x  weekly - 3 sets - 10 reps - Heel Toe Raises with Counter Support  - 1 x daily - 7 x weekly - 3 sets - 10 reps    ASSESSMENT:  CLINICAL IMPRESSION: Pt responded well to increased ambulation distance today and required less rest breaks between bouts of ambulation. During retro-walking in // bars, pt required increased UE support and demonstrated decreased step length bilaterally that is more exaggerated with dual-task of visual scanning. Pt remained highly motivated to participate during session and denied excessive fatigue at end of session. Pt will continue to benefit from skilled therapy to address remaining deficits in order to improve overall QoL and return to PLOF.     OBJECTIVE IMPAIRMENTS: Abnormal gait, decreased activity tolerance, decreased balance,  decreased coordination, decreased endurance, decreased mobility, difficulty walking, decreased ROM, decreased strength, increased edema, impaired flexibility, improper body mechanics, postural dysfunction, obesity, and pain.   ACTIVITY LIMITATIONS: carrying, lifting, bending, standing, squatting, stairs, transfers, toileting, dressing, and locomotion level  PARTICIPATION LIMITATIONS: meal prep, cleaning, laundry, shopping, community activity, yard work, and school  PERSONAL FACTORS: Fitness, Sex, and 3+ comorbidities: PMH significant for DM II with neuropathy, HTN, thrombocytopenia, hypothyroidism, Prader-Willi syndrome,  are also affecting patient's functional outcome.   REHAB POTENTIAL: Good  CLINICAL DECISION MAKING: Evolving/moderate complexity  EVALUATION COMPLEXITY: Moderate   GOALS: Goals reviewed with patient? Yes  SHORT TERM GOALS: Target date: 11/29/2023  Patient will be independent in home exercise program to improve strength/mobility for better functional independence with ADLs. Baseline: initiated Goal status: INITIAL    LONG TERM GOALS: Target date: 01/10/2024  Patient will increase lower extremity functional scale to >60/80 to demonstrate improved functional mobility and increased tolerance with ADLs. .  Baseline: deferred Goal status: INITIAL  2.  Patient (> 23 years old) will complete five times sit to stand test in < 15 seconds indicating an increased LE strength and improved balance. Baseline: 42.5 sec, heavy use of BUE 5/21: 14.27 sec.  Heavy use of BUE on RW  Goal status: IN PROGRESS  3.  Patient will increase Berg Balance score by > 6 points to demonstrate decreased fall risk during functional activities Baseline: deferred to future session, may need to complete different balance test if pt has difficulty with cuing for test activities  5/21: 33/56 Goal status: IN PROGRESS  4.  Patient will increase 10 meter walk test to >1.86m/s as to improve gait speed  for better community ambulation and to reduce fall risk. Baseline: 0.16 with BRW 5/21: 0.66m/s with RW  12/02/2023= 0.31 m/s with RW Goal status: IN PROGRESS  5.  Patient will increase six minute walk test distance to >1000 for progression to community ambulator and improve gait ability. Baseline:  280 ft with RW 5/21: 380ft with RW  Goal status: IN PROGRESS    PLAN:  PT FREQUENCY: 1-2x/week  PT DURATION: 12 weeks  PLANNED INTERVENTIONS: [id number]- PT Re-evaluation, 97750- Physical Performance Testing, 97110-Therapeutic exercises, 97530- Therapeutic activity, [id number]- Neuromuscular re-education, 97535- Self Care, [id number]- Manual therapy, [phone]- Gait training, [phone]- Orthotic Initial, [phone]- Orthotic/Prosthetic subsequent, [phone]- Canalith repositioning, [id number]- Splinting, [id number]- Electrical stimulation (unattended), [phone]- Electrical stimulation (manual), [id number]- Ultrasound, Patient/Family education, Balance training, Stair training, Taping, Joint mobilization, Joint manipulation, Spinal mobilization, Scar mobilization, Vestibular training, DME instructions, Wheelchair mobility training, Cryotherapy, and Moist heat.  PLAN FOR NEXT SESSION:  - Continue with  RLE muscle strengthening - Continue with more weight bearing activities for RLE with decreased UE support- Consider using blaze pods Continue with  overall static and dynamic balance   Janell Axe, SPT   This entire session was performed under direct supervision and direction of a licensed therapist/therapist assistant . I have personally read, edited and approve of the note as written.  Darryle Patten PT, DPT  Physical Therapist - [facility]  [facility]  10:29 AM 01/06/24

## 2024-01-06 NOTE — Therapy (Addendum)
 Occupational Therapy Progress/Recertification Note  Dates of reporting period  11/15/2023   to   01/06/2024   Patient Name: Linda Mason [facility] MRN: [id number] DOB:06/29/1996, 28 y.o., female Today's Date: 01/06/2024  [facility] REFERRING PROVIDER: Toribio Mason  END OF SESSION:  OT End of Session - 01/06/24 1231     Visit Number 20    Number of Visits 24    Date for OT Re-Evaluation 03/30/24    OT Start Time 1015    OT Stop Time 1100    OT Time Calculation (min) 45 min    Activity Tolerance Patient tolerated treatment well    Behavior During Therapy [facility] for tasks assessed/performed           Past Medical History:  Diagnosis Date   Diabetes mellitus without complication [facility])    Past Surgical History:  Procedure Laterality Date   KNEE ARTHROSCOPY Right 08/27/2023   Procedure: ARTHROSCOPY KNEE;  Surgeon: Linda Priest, MD;  Location: [facility] ORS;  Service: Orthopedics;  Laterality: Right;   Patient Active Problem List   Diagnosis Date Noted   Closed patellar dislocation, right, initial encounter 09/01/2023   Patellar dislocation 08/26/2023   Type 2 diabetes mellitus without complication, with long-term current use of insulin  (HCC) 08/26/2023   Hypoxia 08/26/2023   Morbid obesity (HCC) 10/09/2021   Diabetes mellitus with neuropathy (HCC) 10/09/2021   Essential hypertension 10/09/2021   Thrombocytopenia (HCC) 10/09/2021   Hypothyroidism 10/09/2021   Prader-Willi syndrome 10/09/2021   Cellulitis 10/08/2021   ONSET DATE: 08/26/2023  REFERRING DIAG: Right knee patella fracture  THERAPY DIAG:  Muscle weakness (generalized)  Other lack of coordination  Rationale for Evaluation and Treatment: Rehabilitation  SUBJECTIVE:   SUBJECTIVE STATEMENT:  Pt. Reports that her uncle recently passed away over the weekend, and is worried about her aunt.   Pt accompanied by: Interpreter:  Mother, Spanish speaking interpreter  PERTINENT  HISTORY: Linda Mason is a 28 year old right-handed morbidly obese female and BMI 44.88 as well as diabetes mellitus, Prader-Willi syndrome, intellectual delay. Presented to [facility] ED on 08/26/2023 after mechanical fall getting out of her bed. X-rays and imaging revealed right knee irreducible patellar dislocation right knee lateral femoral condyle impacting fracture as well as right knee medial patella impaction fracture. Pt underwent right knee arthroscopic assisted patellar reduction with lateral release on 08/27/2023 per Dr. Priest Linda.  Weightbearing as tolerated right lower extremity knee immobilizer when out of bed or when walking. Pt admitted to CIR from 09/02/23 - 09/15/23  PRECAUTIONS: Knee , Knee Immobilizer    WEIGHT BEARING RESTRICTIONS: Yes WBAT  PAIN:  Are you having pain? Yes: NPRS scale: 0/10 Pain location: knee  FALLS: Has patient fallen in last 6 months? No  LIVING ENVIRONMENT: Lives with: lives with their family Lives in: House/apartment Stairs: No ramp Has following equipment at home: Wheelchair (manual)  PLOF: Independent with basic ADLs  PATIENT GOALS: to return to school and go to [employer] with no fear of falling   NEXT MD VISIT: none  OBJECTIVE:  Note: Objective measures were completed at Evaluation unless otherwise noted.  HAND DOMINANCE: Left  ADLs: Transfers/ambulation related to ADLs: SUPERVISION short distances using RW, MOD I using w/c Eating: IND Grooming: IND Upper body dressing: SETUP assist Lower body dressing: MAX A Toileting: SUPERVISION Bathing: MOD A bathing   Tub shower transfers: SUPERVISION Equipment: Shower seat with back, Walk in shower, and bed side commode  UPPER EXTREMITY ROM:  Active ROM Right  [facility] Right 01/06/2024 Left  [facility] Left 01/06/2024  Shoulder Flexion  100 (135)  115(136)  Shoulder Abduction  90(130)  873(864)  Elbow Flexion  115(136)  118(134)  Elbow Extension  [facility]  WFL  Wrist Flexion  32(70)  25(38)-pain   Wrist  Extension  36(60)  WFL    UPPER EXTREMITY MMT:   Question accuracy given difficulty following commands   MMT Right eval Right 01/06/2024 Left eval Left 01/06/2024  Shoulder flexion 3+/5 3-/5 3+/5 3-/5  Shoulder abduction  3-/5  3-/5  Elbow flexion 3+/5 3-/5 3+/5 3-/5  Elbow extension 3+/5 3+/5 3+/5 3+/5  Wrist flexion  3-/5  3-/5  Wrist extension  3+/5  3+/5  (Blank rows = not tested)  HAND FUNCTION: Grip strength: Right: 10 lbs; Left: 15 lbs, Lateral pinch: Right: 4 lbs, Left: 5 lbs, and 3 point pinch: Right: 1 lbs, Left: 1 lbs  01/06/2024 Grip Strength: Right: 13#, Left: 26#, Lateral pinch: Right: 6#, Left: 5#, 3 point pinch: Right: 5#, Left: 4#  COORDINATION: 9 Hole Peg test: Right: 40 sec; Left: 30 sec  01/06/2024 Right: 31 secs, Left: 27 secs  SENSATION: Light touch: Impaired  tingling  EDEMA: none  COGNITION: Overall cognitive status: History of cognitive impairments - at baseline Areas of impairment: Areas of impairment: Problem solving  Commands: Follows one step commands with increased time Behavior: Perseveration and anxious  OBSERVATIONS: Pt appears anxious and looking toward her mother for answers to questions.    TREATMENT DATE: 01/06/2024  Measurements obtained, and goals reviewed with the patient and patient caregiver.    PATIENT EDUCATION: Education details: BUE strengthening, functional reaching, ROM, grip/pinch strength, and ROM. Person educated: Counselling psychologist, pt Education method: Medical illustrator Education comprehension: verbalized understanding, mother receptive  HOME EXERCISE PROGRAM:  Theraputty  GOALS: Goals reviewed with patient? Yes    SHORT TERM GOALS: Target date: 02/17/2024    Pt will INDEPENDENTLY complete HEP  Baseline: 11/15/2023: Supervision, cues 10/14/23: no HEP issued Goal status: INITIAL  LONG TERM GOALS: Target date: 03/30/2024    Pt will increase B strength by 15# to assist with gripping grab bar for safe transfers    Baseline: 01/06/2024: Grip Strength: R: 13#, L:26#. Pt. Has improved with grasping the grab bars. 11/15/23: Pt. Is able to grip  the grab bar. 10/14/23: L 15#, R 10#  Goal status:  progressing, ongoing  2.  Pt will INDEPENDENTLY transfer to regular height toilet  Baseline: 01/05/3034: MOD I toileting transfers using RW 11/15/23: Modified Independent toileting at home, Supervision using the hospital clinic bathroom Eval: SUPERVISION + RW for elevated toilet, MIN A + RW for regular toilet Goal status: Achieved.   3.  Pt will improve B [facility] by decreasing 9 hole PEG test score by 10 seconds for participation in leisure activities (coloring).  Baseline: 01/06/2024: 9 hole peg test: R: 31 sec, L: 27 sec. 11/15/23:Continue Eval: L 30, R 40 Goal status: progressing, ongoing  4.  Pt will verbalize x3 strategies to manage anxiousness and fear of falling to reduce fall risk. Baseline: 01/06/2024: Pt. Is now using a walker to complete toileting tasks consistently without fear of falling. 11/15/23: Consistent cues required Eval: 0/3 strategies.  Goal status: Goal achieved.   5.  Pt will increase B lateral pinch strength by 5# to manage ADL items.  Baseline: 01/06/2024: Lateral: R: 6#, L: 5#. Eval: Continue R 4#, L 5# Goal status: Progressing, Ongoing  6. Pt. Will perform LE dressing  with Supervision with A/E use. Baseline: 01/06/2024: A/E reviewed, Pts continues to require assist from her mother to donn shoes and socks. Eval: MinA donning/doffing socks, ModA shoes Goal Status: Ongoing   7. Pt. Will be able to tolerate performing a 15 min. Standing ADL/IADL tasks using a RW without requiring rest breaks    Baseline: 01/06/24: Pt. Requires multiple rest breaks for minimal standing reaching activity  Goal status: NEW   ASSESSMENT:  CLINICAL IMPRESSION:  Pt. was present with her mother, and an interpreter today. Pt Reports feeling upset and worried about her aunt because of the recent passing of her uncle over the  weekend. Pt. Required cues for redirection during the session today, due to this. Measurements were obtained and goals were reviewed with patient and patient caregiver today. Pt. Presents with limited BUE ROM in shoulder flexion, abduction, limited R wrist ROM in wrist flexion and extension, and limited L wrist extension. Pt caregiver reports that pt. Has difficulty with lower body dressing, specifically requiring assistance donning/doffing socks and shoes. Pt. Has improved in Right Lateral and 3 pt pinch strength, and has increased 3 pt. Pinch strength in the L hand. Pt. Has improved with [facility] skills while performing 9-hole peg test, decreasing completion time by 9 seconds on the Right, and 3 seconds on the Left. Pt. Has increased R Grip strength by 3# of force, and L Grip strength by 11# of force. A new goal was added to the POC to improve standing tolerance during ADLs, and IADLs. Pt. continues to benefit from OT services to improve BUE strength/dexterity and to provide ADL training in order to maximize indep with daily tasks.   PERFORMANCE DEFICITS: in functional skills including ADLs, coordination, strength, pain, and balance, cognitive skills including attention and problem solving, and psychosocial skills including coping strategies and environmental adaptation.   IMPAIRMENTS: are limiting patient from ADLs and leisure.   COMORBIDITIES: has co-morbidities such as Prader-Willi syndrome, intellectual delay that affect occupational performance. Patient will benefit from skilled OT to address above impairments and improve overall function.  MODIFICATION OR ASSISTANCE TO COMPLETE EVALUATION: Min-Moderate modification of tasks or assist with assess necessary to complete an evaluation.  OT OCCUPATIONAL PROFILE AND HISTORY: Detailed assessment: Review of records and additional review of physical, cognitive, psychosocial history related to current functional performance.  CLINICAL DECISION MAKING: Moderate  - several treatment options, min-mod task modification necessary  REHAB POTENTIAL: Good  EVALUATION COMPLEXITY: Moderate      PLAN:  OT FREQUENCY: 1-2x/week  OT DURATION: 12 weeks  PLANNED INTERVENTIONS: 97535 self care/ADL training, [id number] therapeutic exercise, 97530 therapeutic activity, patient/family education, and DME and/or AE instructions  RECOMMENDED OTHER SERVICES: PT  CONSULTED AND AGREED WITH PLAN OF CARE: Patient and family member/caregiver  PLAN FOR NEXT SESSION: ADLs   Damien Nap, OTS   This entire session was performed under direct supervision and direction of a licensed therapist/therapist assistant . I have personally read, edited and approve of the note as written.   Richardson Otter, MS, OTR/L    01/06/2024, 12:34 PM

## 2024-01-11 ENCOUNTER — Ambulatory Visit: Payer: MEDICAID | Admitting: Occupational Therapy

## 2024-01-11 ENCOUNTER — Telehealth: Payer: Self-pay

## 2024-01-11 ENCOUNTER — Ambulatory Visit: Payer: MEDICAID

## 2024-01-11 DIAGNOSIS — M6281 Muscle weakness (generalized): Secondary | ICD-10-CM | POA: Diagnosis not present

## 2024-01-11 DIAGNOSIS — R278 Other lack of coordination: Secondary | ICD-10-CM

## 2024-01-11 NOTE — Telephone Encounter (Signed)
 PT reached out to pt via secure phone due to missed visit/no-show this morning and left VM with info regarding OT appointment also scheduled for later this morning. Pt did arrive to clinic after PT left VM. PT informed pt in person of late policy, but that she still can be seen for OT today. Pt agreeable to plan.  Darryle Patten PT, DPT

## 2024-01-11 NOTE — Therapy (Cosign Needed)
 Occupational Therapy Treatment note   Patient Name: Linda Mason MRN: [id number] DOB:06/19/1996, 28 y.o., female Today's Date: 01/11/2024  PCP: Linda [facility], [facility] REFERRING PROVIDER: Toribio Mason  END OF SESSION:  OT End of Session - 01/11/24 1410     Visit Number 21    Number of Visits 24    Date for OT Re-Evaluation 03/30/24    OT Start Time 1015    OT Stop Time 1100    OT Time Calculation (min) 45 min    Activity Tolerance Patient tolerated treatment well    Behavior During Therapy [facility] for tasks assessed/performed            Past Medical History:  Diagnosis Date   Diabetes mellitus without complication (HCC)    Past Surgical History:  Procedure Laterality Date   KNEE ARTHROSCOPY Right 08/27/2023   Procedure: ARTHROSCOPY KNEE;  Surgeon: Linda Priest, MD;  Location: [facility] ORS;  Service: Orthopedics;  Laterality: Right;   Patient Active Problem List   Diagnosis Date Noted   Closed patellar dislocation, right, initial encounter 09/01/2023   Patellar dislocation 08/26/2023   Type 2 diabetes mellitus without complication, with long-term current use of insulin  (HCC) 08/26/2023   Hypoxia 08/26/2023   Morbid obesity (HCC) 10/09/2021   Diabetes mellitus with neuropathy (HCC) 10/09/2021   Essential hypertension 10/09/2021   Thrombocytopenia (HCC) 10/09/2021   Hypothyroidism 10/09/2021   Prader-Willi syndrome 10/09/2021   Cellulitis 10/08/2021   ONSET DATE: 08/26/2023  REFERRING DIAG: Right knee patella fracture  THERAPY DIAG:  Muscle weakness (generalized)  Other lack of coordination  Rationale for Evaluation and Treatment: Rehabilitation  SUBJECTIVE:   SUBJECTIVE STATEMENT:  Pt. Reports feeling upset about missing PT appointment today.   Pt accompanied by: Interpreter:  Mother, Spanish speaking interpreter  PERTINENT HISTORY: Linda Mason is a 28 year old right-handed morbidly obese female and BMI 44.88 as well as diabetes  mellitus, Prader-Willi syndrome, intellectual delay. Presented to [facility] on 08/26/2023 after mechanical fall getting out of her bed. X-rays and imaging revealed right knee irreducible patellar dislocation right knee lateral femoral condyle impacting fracture as well as right knee medial patella impaction fracture. Pt underwent right knee arthroscopic assisted patellar reduction with lateral release on 08/27/2023 per Dr. Priest Linda.  Weightbearing as tolerated right lower extremity knee immobilizer when out of bed or when walking. Pt admitted to CIR from 09/02/23 - 09/15/23  PRECAUTIONS: Knee , Knee Immobilizer    WEIGHT BEARING RESTRICTIONS: Yes WBAT  PAIN:  Are you having pain? Yes: NPRS scale: 0/10 Pain location: knee  FALLS: Has patient fallen in last 6 months? No  LIVING ENVIRONMENT: Lives with: lives with their family Lives in: House/apartment Stairs: No ramp Has following equipment at home: Wheelchair (manual)  PLOF: Independent with basic ADLs  PATIENT GOALS: to return to school and go to [employer] with no fear of falling   NEXT MD VISIT: none  OBJECTIVE:  Note: Objective measures were completed at Evaluation unless otherwise noted.  HAND DOMINANCE: Left  ADLs: Transfers/ambulation related to ADLs: SUPERVISION short distances using RW, MOD I using w/c Eating: IND Grooming: IND Upper body dressing: SETUP assist Lower body dressing: MAX A Toileting: SUPERVISION Bathing: MOD A bathing   Tub shower transfers: SUPERVISION Equipment: Shower seat with back, Walk in shower, and bed side commode  UPPER EXTREMITY ROM:     Active ROM Right  [facility] Right 01/06/2024 Left  [facility] Left 01/06/2024  Shoulder Flexion  100 (135)  115(136)  Shoulder Abduction  90(130)  [id number]  Elbow Flexion  115(136)  [id number])  Elbow Extension  [facility]  WFL  Wrist Flexion  32(70)  25(38)-pain   Wrist Extension  36(60)  WFL    UPPER EXTREMITY MMT:   Question accuracy given difficulty following commands    MMT Right eval Right 01/06/2024 Left eval Left 01/06/2024  Shoulder flexion 3+/5 3-/5 3+/5 3-/5  Shoulder abduction  3-/5  3-/5  Elbow flexion 3+/5 3-/5 3+/5 3-/5  Elbow extension 3+/5 3+/5 3+/5 3+/5  Wrist flexion  3-/5  3-/5  Wrist extension  3+/5  3+/5  (Blank rows = not tested)  HAND FUNCTION: Grip strength: Right: 10 lbs; Left: 15 lbs, Lateral pinch: Right: 4 lbs, Left: 5 lbs, and 3 point pinch: Right: 1 lbs, Left: 1 lbs  01/06/2024 Grip Strength: Right: 13#, Left: 26#, Lateral pinch: Right: 6#, Left: 5#, 3 point pinch: Right: 5#, Left: 4#  COORDINATION: 9 Hole Peg test: Right: 40 sec; Left: 30 sec  01/06/2024 Right: 31 secs, Left: 27 secs  SENSATION: Light touch: Impaired  tingling  EDEMA: none  COGNITION: Overall cognitive status: History of cognitive impairments - at baseline Areas of impairment: Areas of impairment: Problem solving  Commands: Follows one step commands with increased time Behavior: Perseveration and anxious  OBSERVATIONS: Pt appears anxious and looking toward her mother for answers to questions.    TREATMENT DATE: 01/11/2024  Therapeutic Activities:  -Pt. Tolerated 2 trials of Bilateral UE AROM reaching for [place]o rings at tabletop surface while standing, and moving them through 4 levels of horizontal rungs placed at progressively higher heights promoting shoulder flexion, elbow extension, and wrist extension.  -Pt. Progressed using 1# wrist weight to 2# wrist weight while performing Bilateral UE AROM reaching using saebo rings.  -Pt. Performed Bilateral UE [facility] skills grasping and storing 2-3 pegs, in preparation for placing pegs into peg board at a tabletop surface. -To further challenge the task, Pt. Placed pegs into pegboard at a vertical angle, promoting wrist extension.  -Pt. Tolerated 2 trials of Bilateral UE Progressive gross grip strengthening with  6.6# of grip strength resistive force.      PATIENT EDUCATION: Education details: BUE  strengthening, functional reaching, ROM, grip/pinch strength, and ROM. Person educated: Counselling psychologist, pt Education method: Medical illustrator Education comprehension: verbalized understanding, mother receptive  HOME EXERCISE PROGRAM:  Theraputty  GOALS: Goals reviewed with patient? Yes    SHORT TERM GOALS: Target date: 02/17/2024    Pt will INDEPENDENTLY complete HEP  Baseline: 11/15/2023: Supervision, cues 10/14/23: no HEP issued Goal status: INITIAL  LONG TERM GOALS: Target date: 03/30/2024    Pt will increase B strength by 15# to assist with gripping grab bar for safe transfers   Baseline: 01/06/2024: Grip Strength: R: 13#, L:26#. Pt. Has improved with grasping the grab bars. 11/15/23: Pt. Is able to grip  the grab bar. 10/14/23: L 15#, R 10#  Goal status:  progressing, ongoing  2.  Pt will INDEPENDENTLY transfer to regular height toilet  Baseline: 01/05/3034: MOD I toileting transfers using RW 11/15/23: Modified Independent toileting at home, Supervision using the hospital clinic bathroom Eval: SUPERVISION + RW for elevated toilet, MIN A + RW for regular toilet Goal status: Achieved.   3.  Pt will improve B FMC by decreasing 9 hole PEG test score by 10 seconds for participation in leisure activities (coloring).  Baseline: 01/06/2024: 9 hole peg test: R: 31 sec, L: 27 sec. 11/15/23:Continue Eval: L 30, R 40 Goal  status: progressing, ongoing  4.  Pt will verbalize x3 strategies to manage anxiousness and fear of falling to reduce fall risk. Baseline: 01/06/2024: Pt. Is now using a walker to complete toileting tasks consistently without fear of falling. 11/15/23: Consistent cues required Eval: 0/3 strategies.  Goal status: Goal achieved.   5.  Pt will increase B lateral pinch strength by 5# to manage ADL items.  Baseline: 01/06/2024: Lateral: R: 6#, L: 5#. Eval: Continue R 4#, L 5# Goal status: Progressing, Ongoing  6. Pt. Will perform LE dressing with Supervision with A/E  use. Baseline: 01/06/2024: A/E reviewed, Pts continues to require assist from her mother to donn shoes and socks. Eval: MinA donning/doffing socks, ModA shoes Goal Status: Ongoing   7. Pt. Will be able to tolerate performing a 15 min. Standing ADL/IADL tasks using a RW without requiring rest breaks    Baseline: 01/06/24: Pt. Requires multiple rest breaks for minimal standing reaching activity  Goal status: NEW   ASSESSMENT:  CLINICAL IMPRESSION:  Pt. Tolerated treatment session well today. Pt. Was upset about missing her PT session prior to today's treatment session, and required re-direction due to to this. Pt. Has difficulty sustaining Bilateral UE gross grip using gross gripper in elbow extension and wrist extension, resulting in dropping pegs while discarding them into container at higher planes. Pt. progressed from 1# wrist weight to 2# wrist weights on Bilateral UE while completing functional reaching tasks. Pt. continues to benefit from OT services to improve BUE strength/dexterity and to provide ADL training in order to maximize indep with daily tasks.   PERFORMANCE DEFICITS: in functional skills including ADLs, coordination, strength, pain, and balance, cognitive skills including attention and problem solving, and psychosocial skills including coping strategies and environmental adaptation.   IMPAIRMENTS: are limiting patient from ADLs and leisure.   COMORBIDITIES: has co-morbidities such as Prader-Willi syndrome, intellectual delay that affect occupational performance. Patient will benefit from skilled OT to address above impairments and improve overall function.  MODIFICATION OR ASSISTANCE TO COMPLETE EVALUATION: Min-Moderate modification of tasks or assist with assess necessary to complete an evaluation.  OT OCCUPATIONAL PROFILE AND HISTORY: Detailed assessment: Review of records and additional review of physical, cognitive, psychosocial history related to current functional  performance.  CLINICAL DECISION MAKING: Moderate - several treatment options, min-mod task modification necessary  REHAB POTENTIAL: Good  EVALUATION COMPLEXITY: Moderate      PLAN:  OT FREQUENCY: 1-2x/week  OT DURATION: 12 weeks  PLANNED INTERVENTIONS: 97535 self care/ADL training, 02889 therapeutic exercise, 97530 therapeutic activity, patient/family education, and DME and/or AE instructions  RECOMMENDED OTHER SERVICES: PT  CONSULTED AND AGREED WITH PLAN OF CARE: Patient and family member/caregiver  PLAN FOR NEXT SESSION: ADLs   Damien Nap, OTS   This entire session was performed under direct supervision and direction of a licensed therapist/therapist assistant . I have personally read, edited and approve of the note as written.   Richardson Otter, MS, OTR/L    01/11/2024, 2:11 PM

## 2024-01-13 ENCOUNTER — Ambulatory Visit: Payer: MEDICAID | Admitting: Occupational Therapy

## 2024-01-13 ENCOUNTER — Ambulatory Visit: Payer: MEDICAID

## 2024-01-13 DIAGNOSIS — M6281 Muscle weakness (generalized): Secondary | ICD-10-CM

## 2024-01-13 DIAGNOSIS — R278 Other lack of coordination: Secondary | ICD-10-CM

## 2024-01-13 DIAGNOSIS — R2681 Unsteadiness on feet: Secondary | ICD-10-CM

## 2024-01-13 DIAGNOSIS — R262 Difficulty in walking, not elsewhere classified: Secondary | ICD-10-CM

## 2024-01-13 NOTE — Therapy (Addendum)
 Occupational Therapy Treatment note   Patient Name: Linda Mason MRN: [id number] DOB:[birth year], 28 y.o., female Today's Date: 01/13/2024  PCP: Linda Mason Floor[facility] REFERRING PROVIDER: Toribio Mason  END OF SESSION:  OT End of Session - 01/13/24 1607     Visit Number 22    Number of Visits 24    Date for OT Re-Evaluation 03/30/24    OT Start Time 1015    OT Stop Time 1100    OT Time Calculation (min) 45 min    Activity Tolerance Patient tolerated treatment well    Behavior During Therapy [facility] for tasks assessed/performed             Past Medical History:  Diagnosis Date   Diabetes mellitus without complication (HCC)    Past Surgical History:  Procedure Laterality Date   KNEE ARTHROSCOPY Right 08/27/2023   Procedure: ARTHROSCOPY KNEE;  Surgeon: Linda Priest, MD;  Location: [facility] ORS;  Service: Orthopedics;  Laterality: Right;   Patient Active Problem List   Diagnosis Date Noted   Closed patellar dislocation, right, initial encounter 09/01/2023   Patellar dislocation 08/26/2023   Type 2 diabetes mellitus without complication, with long-term current use of insulin  (HCC) 08/26/2023   Hypoxia 08/26/2023   Morbid obesity (HCC) 10/09/2021   Diabetes mellitus with neuropathy (HCC) 10/09/2021   Essential hypertension 10/09/2021   Thrombocytopenia (HCC) 10/09/2021   Hypothyroidism 10/09/2021   Prader-Willi syndrome 10/09/2021   Cellulitis 10/08/2021   ONSET DATE: 08/26/2023  REFERRING DIAG: Right knee patella fracture  THERAPY DIAG:  Muscle weakness (generalized)  Other lack of coordination  Rationale for Evaluation and Treatment: Rehabilitation  SUBJECTIVE:   SUBJECTIVE STATEMENT:  Pt. Reports that she feels like she got a good stretch during functional reaching tasks today.   Pt accompanied by: Interpreter:  Mother, Spanish speaking interpreter  PERTINENT HISTORY: Linda Mason is a 28 year old right-handed morbidly obese  female and BMI 44.88 as well as diabetes mellitus, Prader-Willi syndrome, intellectual delay. Presented to [facility] ED on 08/26/2023 after mechanical fall getting out of her bed. X-rays and imaging revealed right knee irreducible patellar dislocation right knee lateral femoral condyle impacting fracture as well as right knee medial patella impaction fracture. Pt underwent right knee arthroscopic assisted patellar reduction with lateral release on 08/27/2023 per Linda Mason.  Weightbearing as tolerated right lower extremity knee immobilizer when out of bed or when walking. Pt admitted to CIR from 09/02/23 - 09/15/23  PRECAUTIONS: Knee , Knee Immobilizer    WEIGHT BEARING RESTRICTIONS: Yes WBAT  PAIN:  Are you having pain? Yes: NPRS scale: 0/10 Pain location: knee  FALLS: Has patient fallen in last 6 months? No  LIVING ENVIRONMENT: Lives with: lives with their family Lives in: House/apartment Stairs: No ramp Has following equipment at home: Wheelchair (manual)  PLOF: Independent with basic ADLs  PATIENT GOALS: to return to school and go to [employer] with no fear of falling   NEXT MD VISIT: none  OBJECTIVE:  Note: Objective measures were completed at Evaluation unless otherwise noted.  HAND DOMINANCE: Left  ADLs: Transfers/ambulation related to ADLs: SUPERVISION short distances using RW, MOD I using w/c Eating: IND Grooming: IND Upper body dressing: SETUP assist Lower body dressing: MAX A Toileting: SUPERVISION Bathing: MOD A bathing   Tub shower transfers: SUPERVISION Equipment: Shower seat with back, Walk in shower, and bed side commode  UPPER EXTREMITY ROM:     Active ROM Right  [facility] Right 01/06/2024 Left  [facility] Left 01/06/2024  Shoulder Flexion  100 (135)  115(136)  Shoulder Abduction  90(130)  873(864)  Elbow Flexion  115(136)  881(865)  Elbow Extension  [facility]  WFL  Wrist Flexion  32(70)  25(38)-pain   Wrist Extension  36(60)  WFL    UPPER EXTREMITY MMT:   Question accuracy  given difficulty following commands   MMT Right eval Right 01/06/2024 Left eval Left 01/06/2024  Shoulder flexion 3+/5 3-/5 3+/5 3-/5  Shoulder abduction  3-/5  3-/5  Elbow flexion 3+/5 3-/5 3+/5 3-/5  Elbow extension 3+/5 3+/5 3+/5 3+/5  Wrist flexion  3-/5  3-/5  Wrist extension  3+/5  3+/5  (Blank rows = not tested)  HAND FUNCTION: Grip strength: Right: 10 lbs; Left: 15 lbs, Lateral pinch: Right: 4 lbs, Left: 5 lbs, and 3 point pinch: Right: 1 lbs, Left: 1 lbs  01/06/2024 Grip Strength: Right: 13#, Left: 26#, Lateral pinch: Right: 6#, Left: 5#, 3 point pinch: Right: 5#, Left: 4#  COORDINATION: 9 Hole Peg test: Right: 40 sec; Left: 30 sec  01/06/2024 Right: 31 secs, Left: 27 secs  SENSATION: Light touch: Impaired  tingling  EDEMA: none  COGNITION: Overall cognitive status: History of cognitive impairments - at baseline Areas of impairment: Areas of impairment: Problem solving  Commands: Follows one step commands with increased time Behavior: Perseveration and anxious  OBSERVATIONS: Pt appears anxious and looking toward her mother for answers to questions.    TREATMENT DATE: 01/13/2024  Therapeutic Activities:   -Pt. Performed 2 trials of BUE Lateral, and 3pt. Pinch strengthening using yellow, red, green, and blue, and black level resistive clips onto vertical dowel positioned in multiple planes to encourage functional reaching. -During 2nd trial, Pt. Tolerated 2# wrist weight, with repetitions of lateral and 3 pt. Pinching using yellow, red, green, blue and black level resistive clips.  -Pt. Performed BUE  functional reaching tasks in combination of formulating a 3 point pinch, to grasp and remove suctions from whiteboard placed on vertical angle to challenge wrist extension, elbow extension while formulating 3 point pinch.  -In preparation of removing suctions from whiteboard, Pt. Firmly applied suctions to whiteboard to keep them in place using a 3 pt. pinch.  -To further  challenge the task, Pt. Tolerated 2# wrist weight to promote BUE strengthening during functional reaching.   PATIENT EDUCATION: Education details: BUE strengthening, functional reaching, ROM, grip/pinch strength, and ROM. Person educated: Counselling psychologist, pt Education method: Medical illustrator Education comprehension: verbalized understanding, mother receptive  HOME EXERCISE PROGRAM:  Theraputty  GOALS: Goals reviewed with patient? Yes    SHORT TERM GOALS: Target date: 02/17/2024    Pt will INDEPENDENTLY complete HEP  Baseline: 11/15/2023: Supervision, cues 10/14/23: no HEP issued Goal status: INITIAL  LONG TERM GOALS: Target date: 03/30/2024    Pt will increase B strength by 15# to assist with gripping grab bar for safe transfers   Baseline: 01/06/2024: Grip Strength: R: 13#, L:26#. Pt. Has improved with grasping the grab bars. 11/15/23: Pt. Is able to grip  the grab bar. 10/14/23: L 15#, R 10#  Goal status:  progressing, ongoing  2.  Pt will INDEPENDENTLY transfer to regular height toilet  Baseline: 01/05/3034: MOD I toileting transfers using RW 11/15/23: Modified Independent toileting at home, Supervision using the hospital clinic bathroom Eval: SUPERVISION + RW for elevated toilet, MIN A + RW for regular toilet Goal status: Achieved.   3.  Pt will improve B FMC by decreasing 9 hole PEG test score by 10 seconds for  participation in leisure activities (coloring).  Baseline: 01/06/2024: 9 hole peg test: R: 31 sec, L: 27 sec. 11/15/23:Continue Eval: L 30, R 40 Goal status: progressing, ongoing  4.  Pt will verbalize x3 strategies to manage anxiousness and fear of falling to reduce fall risk. Baseline: 01/06/2024: Pt. Is now using a walker to complete toileting tasks consistently without fear of falling. 11/15/23: Consistent cues required Eval: 0/3 strategies.  Goal status: Goal achieved.   5.  Pt will increase B lateral pinch strength by 5# to manage ADL items.  Baseline: 01/06/2024:  Lateral: R: 6#, L: 5#. Eval: Continue R 4#, L 5# Goal status: Progressing, Ongoing  6. Pt. Will perform LE dressing with Supervision with A/E use. Baseline: 01/06/2024: A/E reviewed, Pts continues to require assist from her mother to donn shoes and socks. Eval: MinA donning/doffing socks, ModA shoes Goal Status: Ongoing   7. Pt. Will be able to tolerate performing a 15 min. Standing ADL/IADL tasks using a RW without requiring rest breaks    Baseline: 01/06/24: Pt. Requires multiple rest breaks for minimal standing reaching activity  Goal status: NEW   ASSESSMENT:  CLINICAL IMPRESSION:  Pt. tolerated treatment session well today. Pt. has difficulty sustaining a Lateral pinch using green, blue, and black level resistive clips onto vertical dowel. Pt. has improved BUE functional reaching, however, has difficulty combining movements such as elbow extension and wrist extension. Pt. required Mod vc while performing lateral and 3 pt. Pinch repititions before applying clips to vertical dowel. Pt. continues to benefit from OT services to improve BUE strength/dexterity and to provide ADL training in order to maximize indep with daily tasks.   PERFORMANCE DEFICITS: in functional skills including ADLs, coordination, strength, pain, and balance, cognitive skills including attention and problem solving, and psychosocial skills including coping strategies and environmental adaptation.   IMPAIRMENTS: are limiting patient from ADLs and leisure.   COMORBIDITIES: has co-morbidities such as Prader-Willi syndrome, intellectual delay that affect occupational performance. Patient will benefit from skilled OT to address above impairments and improve overall function.  MODIFICATION OR ASSISTANCE TO COMPLETE EVALUATION: Min-Moderate modification of tasks or assist with assess necessary to complete an evaluation.  OT OCCUPATIONAL PROFILE AND HISTORY: Detailed assessment: Review of records and additional review of  physical, cognitive, psychosocial history related to current functional performance.  CLINICAL DECISION MAKING: Moderate - several treatment options, min-mod task modification necessary  REHAB POTENTIAL: Good  EVALUATION COMPLEXITY: Moderate      PLAN:  OT FREQUENCY: 1-2x/week  OT DURATION: 12 weeks  PLANNED INTERVENTIONS: 97535 self care/ADL training, 02889 therapeutic exercise, 97530 therapeutic activity, patient/family education, and DME and/or AE instructions  RECOMMENDED OTHER SERVICES: PT  CONSULTED AND AGREED WITH PLAN OF CARE: Patient and family member/caregiver  PLAN FOR NEXT SESSION: ADLs   Damien Nap, OTS   This entire session was performed under direct supervision and direction of a licensed therapist/therapist assistant . I have personally read, edited and approve of the note as written.   Richardson Otter, MS, OTR/L    01/13/2024, 4:08 PM

## 2024-01-13 NOTE — Therapy (Signed)
 OUTPATIENT PHYSICAL THERAPY LE TREATMENT/Physical Therapy Progress Note/RECERT   Dates of reporting period  11/24/2023   to   01/13/2024    Patient Name: Linda Mason [facility] MRN: [id number] DOB:Dec 15, 1995, 28 y.o., female Today's Date: 01/13/2024  END OF SESSION:  PT End of Session - 01/13/24 0915     Visit Number 20    Number of Visits 32    Date for PT Re-Evaluation 04/06/24    Progress Note Due on Visit 20    PT Start Time 0930    PT Stop Time 1011    PT Time Calculation (min) 41 min    Equipment Utilized During Treatment Gait belt    Activity Tolerance Patient tolerated treatment well;No increased pain    Behavior During Therapy [facility] for tasks assessed/performed            Past Medical History:  Diagnosis Date   Diabetes mellitus without complication [facility])    Past Surgical History:  Procedure Laterality Date   KNEE ARTHROSCOPY Right 08/27/2023   Procedure: ARTHROSCOPY KNEE;  Surgeon: Tobie Priest, MD;  Location: [facility] ORS;  Service: Orthopedics;  Laterality: Right;   Patient Active Problem List   Diagnosis Date Noted   Closed patellar dislocation, right, initial encounter 09/01/2023   Patellar dislocation 08/26/2023   Type 2 diabetes mellitus without complication, with long-term current use of insulin  (HCC) 08/26/2023   Hypoxia 08/26/2023   Morbid obesity (HCC) 10/09/2021   Diabetes mellitus with neuropathy (HCC) 10/09/2021   Essential hypertension 10/09/2021   Thrombocytopenia (HCC) 10/09/2021   Hypothyroidism 10/09/2021   Prader-Willi syndrome 10/09/2021   Cellulitis 10/08/2021     PCP: [facility]  REFERRING PROVIDER: Pegge Toribio PARAS, PA-C   REFERRING DIAG:  Diagnosis  S83.004A (ICD-10-CM) - Closed patellar dislocation, right, initial encounter    Rationale for Evaluation and Treatment: Rehabilitation  THERAPY DIAG:  Difficulty in walking, not elsewhere classified  Other lack of coordination  Unsteadiness on  feet  Muscle weakness (generalized)  ONSET DATE: 08/26/2023   SUBJECTIVE:                                                                                                                                                                                           SUBJECTIVE STATEMENT Pt ambulating without her RW into clinic, no AD. She has been walking everywhere without her walker and reports no stumbles/falls.    From Eval: The pt is a pleasant 28 yo female. She presents in [facility] with her mom and an interpreter present for PT eval. Pt suffered a R knee dislocation following a fall 08/26/2023. Pt also found to have  lateral femoral condyle impacting fx and medial patella impaction fracture of RLE. She underwent arthroscopic assisted R patellar reduction with lateral release on 08/27/2023. Pt admitted to [facility] from 09/02/23 - 09/15/23/ Pt just had follow-up with surgeon and has been instructed to wean from hinged knee brace/has been released from care; she is WBAT. Pt's mother reports pt using 2WW to ambulate for short distances: maybe ambulating 30-50 steps at most without brace. Pt reports ambulation is limited by R knee pain. Her knee hurts most when walking. She has difficulty completing STS but reports she can do this on her own, although not able to do this yet in her school's bathroom and would like to work on this. Pt was independent with ADLs prior to injury. Her mother is helping her now with dressing. She has not yet used steps to enter her home, is currently using her WC on ramp. They report no other falls. Pt wants to improve ability to ambulate for her school prom. She likes to watch movies.  Pt has Prader-Willi syndrome, intellectual delay.   PERTINENT HISTORY:   PMH also significant for DM II with neuropathy, HTN, thrombocytopenia, hypothyroidism, Prader-Willi syndrome.    PAIN:  Are you having pain? None currently, takes medicine for her knee pain; no other pain  PRECAUTIONS:  Knee -  WBAT  RED FLAGS: None   WEIGHT BEARING RESTRICTIONS:  Via surgeon note 1. I recommended the patient be weightbearing as tolerated and wean from the hinged knee brace.  FALLS:  Has patient fallen in last 6 months? No  LIVING ENVIRONMENT: Lives with: lives with their family in one level home Stairs: 2 steps, handrails bilat to enter home, but also has two ramps that she uses to enter home and is not currently using steps Has following equipment at home: RW  OCCUPATION:  Student   PLOF:  Independent  PATIENT GOALS:  Pt wants to walk to be able to go to school prom; she wants to be able to dance.  NEXT MD VISIT: Had follow-up today,  was released from care but can follow-up as needed.    OBJECTIVE:  Note: Objective measures were completed at Evaluation unless otherwise noted.  DIAGNOSTIC FINDINGS:  Via chart, note Tobie Earnestine Clamp, MD R Knee radiographs:  10/18/2023: 2 views (wheelchair bound lateral and oblique radiographs with suboptimal positioning) were obtained. Appropriate images cannot be obtained due to patient's hesitation/unwillingness to transfer to x-ray table. Within these limitations, there does not appear to be a recurrent patellar dislocation. There is significant osteopenia. Impaction fracture about the lateral femoral condyle is also visualized. It does not appear to fully involve the articular weightbearing surface.   PATIENT SURVEYS:  LEFS deferred  COGNITIVE STATUS: Impaired at baseline   SENSATION: WFL BLE  COORDINATION: WFL rapid alt LE, unable to do heel>shin likely limited by body habitus and some hesitance/faer of movement  EDEMA:  Pt R knee still with slight edema around patella, no redness present, scars healing well  POSTURE:  rounded shoulders, increased fwd posture/kyphosis possibly impacted by body habitus    GAIT: Distance walked: (see below) Assistive device utilized: Environmental consultant - 2 wheeled Level of assistance:  CGA Comments: Decreased gait speed, antalgic gait, decreased weightbearing and stance time on RLE, heavy BUE weightbearing through RW    Body Part #1 Knee  PALPATION: No pain with palpation superior, inferior, LTL patella, posterior knee RLE  LOWER EXTREMITY ROM:    Need to complete formal assessment, however pt exhibited  to have impaired R hip flexion mobility with MMT, possibly due positioning but needs further assessment.    LOWER EXTREMITY MMT:    MMT Right eval Left eval  Hip flexion 3- 3+  Hip extension    Hip abduction 4 4  Hip adduction 3* 4  Hip internal rotation    Hip external rotation    Knee flexion * 4  Knee extension 3* 4+  Ankle dorsiflexion 4 4  Ankle plantarflexion    Ankle inversion    Ankle eversion     (Blank rows = not tested) * indicates pain-limited. Pt also might be self-limiting due to fear of pain with movement     FUNCTIONAL TESTS:  5 times sit to stand: 42.5 sec heavy use of UE to RW  6 minute walk test: deferred tp future visit 10 meter walk test: 0.16 sec with BRW  Berg Balance Scale: deferred to future visit                                                                                    TREATMENT DATE:   Physical Performance:  6 Min Walk Test:  Instructed patient to ambulate as quickly and as safely as possible for 6 minutes using LRAD. Patient was allowed to take standing rest breaks without stopping the test, but if the patient required a sitting rest break the clock would be stopped and the test would be over.  Results: 684  feet, CGA.  Age Matched Norms (in meters): 78-69 yo M: 59 F: 60, 45-79 yo M: 40 F: 471, 73-89 yo M: 417 F: 392 MDC: 58.21 meters (190.98 feet) or 50 meters (ANPTA Core Set of Outcome Measures for Adults with Neurologic Conditions, 2018)  Five times Sit to Stand Test (FTSS)  TIME: 14.5 sec  Cut off scores indicative of increased fall risk: >12 sec CVA, >16 sec PD, >13 sec vestibular (ANPTA Core Set  of Outcome Measures for Adults with Neurologic Conditions, 2018)  Patient demonstrates increased fall risk as noted by score of  46 /56 on Berg Balance Scale.  (<36= high risk for falls, close to 100%; 37-45 significant >80%; 46-51 moderate >50%; 52-55 lower >25%)   [facility] PT Assessment - 01/13/24 0001       Berg Balance Test   Sit to Stand Able to stand without using hands and stabilize independently    Standing Unsupported Able to stand safely 2 minutes    Sitting with Back Unsupported but Feet Supported on Floor or Stool Able to sit safely and securely 2 minutes    Stand to Sit Sits safely with minimal use of hands    Transfers Able to transfer safely, minor use of hands    Standing Unsupported with Eyes Closed Able to stand 10 seconds safely    Standing Unsupported with Feet Together Able to place feet together independently and stand 1 minute safely    From Standing, Reach Forward with Outstretched Arm Can reach confidently >25 cm (10)    From Standing Position, Pick up Object from Floor Able to pick up shoe, needs supervision    From Standing Position, Turn to Look Behind Over each Shoulder  Looks behind from both sides and weight shifts well    Turn 360 Degrees Able to turn 360 degrees safely in 4 seconds or less    Standing Unsupported, Alternately Place Feet on Step/Stool Able to complete >2 steps/needs minimal assist    Standing Unsupported, One Foot in Front Able to take small step independently and hold 30 seconds    Standing on One Leg Unable to try or needs assist to prevent fall    Total Score 46            10 Meter Walk Test: Patient instructed to walk 10 meters (32.8 ft) as quickly and as safely as possible at their normal speed Results: 0.67 m/s no AD  Cut off scores:   Household Ambulator  < 0.4 m/s  Limited Community Ambulator  0.4 - 0.8 m/s  [employer]  > 0.8 m/s  Increased fall risk  < 1.63m/s  Crossing a Street  >1.34m/s  MCID 0.05 m/s  (small), 0.13 m/s (moderate), 0.06 m/s (significant)  (ANPTA Core Set of Outcome Measures for Adults with Neurologic Conditions, 2018)     TE: LEFS: 47  PATIENT EDUCATION:  Education details: goal reassessment  Person educated: Patient and Parent Education method: Explanation, Demonstration, Tactile cues, and Verbal cues Education comprehension: verbalized understanding, returned demonstration, verbal cues required, tactile cues required, and needs further education  HOME EXERCISE PROGRAM: Reviewed: safe technique, signs and symptoms to watch for that proceeding is OK, stop if feeling increased pain/any sharp pain in knee Access Code: [email or web address] URL: [email or web address] Date: 12/21/2023 Prepared by: Massie Dollar  Exercises - Sit to Stand with Counter Support  - 1 x daily - 7 x weekly - 2 sets - 5 reps - Standing March with Counter Support  - 1 x daily - 3-4 x weekly - 3 sets - 10 reps - Mini Squat with Counter Support  - 1 x daily - 7 x weekly - 3 sets - 10 reps - Standing Knee Flexion with Counter Support  - 1 x daily - 7 x weekly - 3 sets - 10 reps - Standing Hip Abduction with Unilateral Counter Support  - 1 x daily - 7 x weekly - 3 sets - 10 reps - Heel Toe Raises with Counter Support  - 1 x daily - 7 x weekly - 3 sets - 10 reps    ASSESSMENT:  CLINICAL IMPRESSION: Goals reassessed for progress note. Pt making gains as AEB improving , 5xSTS, BERG, and performances, now completing without an AD, or use of UE (such as with 5xSTS) or improving time. This also indicates increased gait speed/ability and LE strength/power. While pt making gains, LEFS score not yet at goal. Pt will continue to benefit from skilled therapy to address remaining deficits in order to improve overall QoL and return to PLOF.     OBJECTIVE IMPAIRMENTS: Abnormal gait, decreased activity tolerance, decreased balance, decreased coordination, decreased endurance, decreased mobility,  difficulty walking, decreased ROM, decreased strength, increased edema, impaired flexibility, improper body mechanics, postural dysfunction, obesity, and pain.   ACTIVITY LIMITATIONS: carrying, lifting, bending, standing, squatting, stairs, transfers, toileting, dressing, and locomotion level  PARTICIPATION LIMITATIONS: meal prep, cleaning, laundry, shopping, community activity, yard work, and school  PERSONAL FACTORS: Fitness, Sex, and 3+ comorbidities: PMH significant for DM II with neuropathy, HTN, thrombocytopenia, hypothyroidism, Prader-Willi syndrome,  are also affecting patient's functional outcome.   REHAB POTENTIAL: Good  CLINICAL DECISION MAKING: Evolving/moderate complexity  EVALUATION COMPLEXITY: Moderate   GOALS:  Goals reviewed with patient? Yes  SHORT TERM GOALS: Target date: 02/24/2024    Patient will be independent in home exercise program to improve strength/mobility for better functional independence with ADLs. Baseline: initiated Goal status: INITIAL    LONG TERM GOALS: Target date: 04/06/2024    Patient will increase lower extremity functional scale to >60/80 to demonstrate improved functional mobility and increased tolerance with ADLs. .  Baseline: deferred; 7/10: 47 Goal status: IN PROGRESS  2.  Patient (> 43 years old) will complete five times sit to stand test in < 15 seconds indicating an increased LE strength and improved balance. Baseline: 42.5 sec, heavy use of BUE 5/21: 14.27 sec.  Heavy use of BUE on RW; 7/10: 14.5 sec light push off armrests, no RW  Goal status: IN PROGRESS  3.  Patient will increase Berg Balance score by > 6 points to demonstrate decreased fall risk during functional activities Baseline: deferred to future session, may need to complete different balance test if pt has difficulty with cuing for test activities  5/21: 33/56: 7/10: 46/56 Goal status: IN PROGRESS  4.  Patient will increase 10 meter walk test to >1.67m/s as to  improve gait speed for better community ambulation and to reduce fall risk. Baseline: 0.16 with BRW 5/21: 0.38m/s with RW  12/02/2023= 0.31 m/s with RW 01/13/24: 0.67 m/s no AD Goal status: IN PROGRESS  5.  Patient will increase six minute walk test distance to >1000 for progression to community ambulator and improve gait ability. Baseline:  280 ft with RW 5/21: 352ft with RW;  7/10: 684 ft without an AD  Goal status: IN PROGRESS    PLAN:  PT FREQUENCY: 1-2x/week  PT DURATION: 12 weeks  PLANNED INTERVENTIONS: [id number]- PT Re-evaluation, 97750- Physical Performance Testing, 97110-Therapeutic exercises, 97530- Therapeutic activity, [id number]- Neuromuscular re-education, 97535- Self Care, [id number]- Manual therapy, [id number]- Gait training, [phone]- Orthotic Initial, [phone]- Orthotic/Prosthetic subsequent, [phone]- Canalith repositioning, [id number]- Splinting, [id number]- Electrical stimulation (unattended), [phone]- Electrical stimulation (manual), [id number]- Ultrasound, Patient/Family education, Balance training, Stair training, Taping, Joint mobilization, Joint manipulation, Spinal mobilization, Scar mobilization, Vestibular training, DME instructions, Wheelchair mobility training, Cryotherapy, and Moist heat.  PLAN FOR NEXT SESSION:  - Continue with  RLE muscle strengthening - Continue with more weight bearing activities for RLE with decreased UE support- Consider using blaze pods Continue with overall static and dynamic balance  Darryle Patten PT, DPT  Physical Therapist - [facility]  [facility]  5:26 PM 01/13/24

## 2024-01-18 ENCOUNTER — Ambulatory Visit: Payer: MEDICAID | Admitting: Occupational Therapy

## 2024-01-18 ENCOUNTER — Ambulatory Visit: Payer: MEDICAID

## 2024-01-18 DIAGNOSIS — R278 Other lack of coordination: Secondary | ICD-10-CM

## 2024-01-18 DIAGNOSIS — G8929 Other chronic pain: Secondary | ICD-10-CM

## 2024-01-18 DIAGNOSIS — R2681 Unsteadiness on feet: Secondary | ICD-10-CM

## 2024-01-18 DIAGNOSIS — M6281 Muscle weakness (generalized): Secondary | ICD-10-CM

## 2024-01-18 DIAGNOSIS — S83004A Unspecified dislocation of right patella, initial encounter: Secondary | ICD-10-CM

## 2024-01-18 DIAGNOSIS — R262 Difficulty in walking, not elsewhere classified: Secondary | ICD-10-CM

## 2024-01-18 NOTE — Therapy (Signed)
 OUTPATIENT PHYSICAL THERAPY LE TREATMENT    Patient Name: Linda Mason MRN: [id number] DOB:Sep 24, 1995, 28 y.o., female Today's Date: 01/18/2024  END OF SESSION:  PT End of Session - 01/18/24 0902     Visit Number 21    Number of Visits 32    Date for PT Re-Evaluation 04/06/24    Progress Note Due on Visit 20    PT Start Time 0930    PT Stop Time 1015    PT Time Calculation (min) 45 min    Equipment Utilized During Treatment Gait belt    Activity Tolerance Patient tolerated treatment well;No increased pain    Behavior During Therapy [facility] for tasks assessed/performed            Past Medical History:  Diagnosis Date   Diabetes mellitus without complication [facility])    Past Surgical History:  Procedure Laterality Date   KNEE ARTHROSCOPY Right 08/27/2023   Procedure: ARTHROSCOPY KNEE;  Surgeon: Tobie Priest, MD;  Location: [facility] ORS;  Service: Orthopedics;  Laterality: Right;   Patient Active Problem List   Diagnosis Date Noted   Closed patellar dislocation, right, initial encounter 09/01/2023   Patellar dislocation 08/26/2023   Type 2 diabetes mellitus without complication, with long-term current use of insulin  (HCC) 08/26/2023   Hypoxia 08/26/2023   Morbid obesity (HCC) 10/09/2021   Diabetes mellitus with neuropathy (HCC) 10/09/2021   Essential hypertension 10/09/2021   Thrombocytopenia (HCC) 10/09/2021   Hypothyroidism 10/09/2021   Prader-Willi syndrome 10/09/2021   Cellulitis 10/08/2021     [facility]P: [facility]  REFERRING PROVIDER: Pegge Toribio PARAS, PA-C   REFERRING DIAG:  Diagnosis  S83.004A (ICD-10-CM) - Closed patellar dislocation, right, initial encounter    Rationale for Evaluation and Treatment: Rehabilitation  THERAPY DIAG:  Muscle weakness (generalized)  Other lack of coordination  Difficulty in walking, not elsewhere classified  Unsteadiness on feet  Chronic pain of right knee  Closed patellar dislocation, right,  initial encounter  ONSET DATE: 08/26/2023   SUBJECTIVE:                                                                                                                                                                                           SUBJECTIVE STATEMENT Pt continues to ambulate without her RW. Denies falls. No pain.     From Eval: The pt is a pleasant 28 yo female. She presents in [facility] with her mom and an interpreter present for PT eval. Pt suffered a R knee dislocation following a fall 08/26/2023. Pt also found to have lateral femoral condyle impacting fx and medial patella impaction fracture of RLE. She  underwent arthroscopic assisted R patellar reduction with lateral release on 08/27/2023. Pt admitted to CIR from 09/02/23 - 09/15/23/ Pt just had follow-up with surgeon and has been instructed to wean from hinged knee brace/has been released from care; she is WBAT. Pt's mother reports pt using 2WW to ambulate for short distances: maybe ambulating 30-50 steps at most without brace. Pt reports ambulation is limited by R knee pain. Her knee hurts most when walking. She has difficulty completing STS but reports she can do this on her own, although not able to do this yet in her school's bathroom and would like to work on this. Pt was independent with ADLs prior to injury. Her mother is helping her now with dressing. She has not yet used steps to enter her home, is currently using her WC on ramp. They report no other falls. Pt wants to improve ability to ambulate for her school prom. She likes to watch movies.  Pt has Prader-Willi syndrome, intellectual delay.   PERTINENT HISTORY:   PMH also significant for DM II with neuropathy, HTN, thrombocytopenia, hypothyroidism, Prader-Willi syndrome.    PAIN:  Are you having pain? None currently, takes medicine for her knee pain; no other pain  PRECAUTIONS:  Knee - WBAT  RED FLAGS: None   WEIGHT BEARING RESTRICTIONS:  Via surgeon note 1. I  recommended the patient be weightbearing as tolerated and wean from the hinged knee brace.  FALLS:  Has patient fallen in last 6 months? No  LIVING ENVIRONMENT: Lives with: lives with their family in one level home Stairs: 2 steps, handrails bilat to enter home, but also has two ramps that she uses to enter home and is not currently using steps Has following equipment at home: RW  OCCUPATION:  Student   PLOF:  Independent  PATIENT GOALS:  Pt wants to walk to be able to go to school prom; she wants to be able to dance.  NEXT MD VISIT: Had follow-up today,  was released from care but can follow-up as needed.    OBJECTIVE:  Note: Objective measures were completed at Evaluation unless otherwise noted.  DIAGNOSTIC FINDINGS:  Via chart, note Tobie Earnestine Clamp, MD R Knee radiographs:  10/18/2023: 2 views (wheelchair bound lateral and oblique radiographs with suboptimal positioning) were obtained. Appropriate images cannot be obtained due to patient's hesitation/unwillingness to transfer to x-ray table. Within these limitations, there does not appear to be a recurrent patellar dislocation. There is significant osteopenia. Impaction fracture about the lateral femoral condyle is also visualized. It does not appear to fully involve the articular weightbearing surface.   PATIENT SURVEYS:  LEFS deferred  COGNITIVE STATUS: Impaired at baseline   SENSATION: WFL BLE  COORDINATION: WFL rapid alt LE, unable to do heel>shin likely limited by body habitus and some hesitance/faer of movement  EDEMA:  Pt R knee still with slight edema around patella, no redness present, scars healing well  POSTURE:  rounded shoulders, increased fwd posture/kyphosis possibly impacted by body habitus    GAIT: Distance walked: (see below) Assistive device utilized: Environmental consultant - 2 wheeled Level of assistance: CGA Comments: Decreased gait speed, antalgic gait, decreased weightbearing and stance time  on RLE, heavy BUE weightbearing through RW    Body Part #1 Knee  PALPATION: No pain with palpation superior, inferior, LTL patella, posterior knee RLE  LOWER EXTREMITY ROM:    Need to complete formal assessment, however pt exhibited to have impaired R hip flexion mobility with MMT, possibly due positioning but  needs further assessment.    LOWER EXTREMITY MMT:    MMT Right eval Left eval  Hip flexion 3- 3+  Hip extension    Hip abduction 4 4  Hip adduction 3* 4  Hip internal rotation    Hip external rotation    Knee flexion * 4  Knee extension 3* 4+  Ankle dorsiflexion 4 4  Ankle plantarflexion    Ankle inversion    Ankle eversion     (Blank rows = not tested) * indicates pain-limited. Pt also might be self-limiting due to fear of pain with movement     FUNCTIONAL TESTS:  5 times sit to stand: 42.5 sec heavy use of UE to RW  6 minute walk test: deferred tp future visit 10 meter walk test: 0.16 sec with BRW  Berg Balance Scale: deferred to future visit                                                                                    TREATMENT DATE: 01/18/24   There.Act:    STS with LLE on block: 2x8, 1x10 CGA. VC's for hip extension in standing. Good carryover.    Alternating cone taps standing on RLE. ~8 with LLE. Heavy RUE support on // bar due to limited strength on RLE. ~5 reps standing on LLE for R knee flexion AROM. Notable ease of completion standing on LLE compared to RLE.    Resisted gait no AD with 2# AW's donned. 2x150', CGA. VC's for foot clearance.    Side stepping along length of // bars with SUE support and CGA. 1/2 bolster step over --> airex pad step up and over --> 1/2 bolster step over. Working on SLS, foot clearance, and lateral hip strength.   4 step up with SUE leading with RLE.  3x8, CGA. Min VC's for RLE use. Good carryover.   Resisted gait: 2x150' with 2.5# AW's, CGA.    Standing heel raises with BUE support: 2x8    PATIENT  EDUCATION:  Education details: goal reassessment  Person educated: Patient and Parent Education method: Explanation, Demonstration, Tactile cues, and Verbal cues Education comprehension: verbalized understanding, returned demonstration, verbal cues required, tactile cues required, and needs further education  HOME EXERCISE PROGRAM: Reviewed: safe technique, signs and symptoms to watch for that proceeding is OK, stop if feeling increased pain/any sharp pain in knee Access Code: [email or web address] URL: [email or web address] Date: 12/21/2023 Prepared by: Massie Dollar  Exercises - Sit to Stand with Counter Support  - 1 x daily - 7 x weekly - 2 sets - 5 reps - Standing March with Counter Support  - 1 x daily - 3-4 x weekly - 3 sets - 10 reps - Mini Squat with Counter Support  - 1 x daily - 7 x weekly - 3 sets - 10 reps - Standing Knee Flexion with Counter Support  - 1 x daily - 7 x weekly - 3 sets - 10 reps - Standing Hip Abduction with Unilateral Counter Support  - 1 x daily - 7 x weekly - 3 sets - 10 reps - Heel Toe Raises with Counter Support  - 1 x daily - 7 x weekly -  3 sets - 10 reps    ASSESSMENT:  CLINICAL IMPRESSION: Progressing PT POC working on RLE stance and foot clearance and general strengthening. Pt with excellent motivation. Does have significant difficulty with RLE SLS compared to LLE needing very heavy SUE support and CGA during SLS activities. Pt also continues to need UE support for all foot clearance and stepping activities due to RLE weakness. PT to continue to progress RLE strength and dynamic balance. Pt will continue to benefit from skilled therapy to address remaining deficits in order to improve overall QoL and return to PLOF.    OBJECTIVE IMPAIRMENTS: Abnormal gait, decreased activity tolerance, decreased balance, decreased coordination, decreased endurance, decreased mobility, difficulty walking, decreased ROM, decreased strength, increased edema, impaired  flexibility, improper body mechanics, postural dysfunction, obesity, and pain.   ACTIVITY LIMITATIONS: carrying, lifting, bending, standing, squatting, stairs, transfers, toileting, dressing, and locomotion level  PARTICIPATION LIMITATIONS: meal prep, cleaning, laundry, shopping, community activity, yard work, and school  PERSONAL FACTORS: Fitness, Sex, and 3+ comorbidities: PMH significant for DM II with neuropathy, HTN, thrombocytopenia, hypothyroidism, Prader-Willi syndrome,  are also affecting patient's functional outcome.   REHAB POTENTIAL: Good  CLINICAL DECISION MAKING: Evolving/moderate complexity  EVALUATION COMPLEXITY: Moderate   GOALS: Goals reviewed with patient? Yes  SHORT TERM GOALS: Target date: 02/24/2024    Patient will be independent in home exercise program to improve strength/mobility for better functional independence with ADLs. Baseline: initiated Goal status: INITIAL    LONG TERM GOALS: Target date: 04/06/2024    Patient will increase lower extremity functional scale to >60/80 to demonstrate improved functional mobility and increased tolerance with ADLs. .  Baseline: deferred; 7/10: 47 Goal status: IN PROGRESS  2.  Patient (> 93 years old) will complete five times sit to stand test in < 15 seconds indicating an increased LE strength and improved balance. Baseline: 42.5 sec, heavy use of BUE 5/21: 14.27 sec.  Heavy use of BUE on RW; 7/10: 14.5 sec light push off armrests, no RW  Goal status: IN PROGRESS  3.  Patient will increase Berg Balance score by > 6 points to demonstrate decreased fall risk during functional activities Baseline: deferred to future session, may need to complete different balance test if pt has difficulty with cuing for test activities  5/21: 33/56: 7/10: 46/56 Goal status: IN PROGRESS  4.  Patient will increase 10 meter walk test to >1.56m/s as to improve gait speed for better community ambulation and to reduce fall  risk. Baseline: 0.16 with BRW 5/21: 0.7m/s with RW  12/02/2023= 0.31 m/s with RW 01/13/24: 0.67 m/s no AD Goal status: IN PROGRESS  5.  Patient will increase six minute walk test distance to >1000 for progression to community ambulator and improve gait ability. Baseline:  280 ft with RW 5/21: 312ft with RW;  7/10: 684 ft without an AD  Goal status: IN PROGRESS    PLAN:  PT FREQUENCY: 1-2x/week  PT DURATION: 12 weeks  PLANNED INTERVENTIONS: 97164- PT Re-evaluation, 97750- Physical Performance Testing, 97110-Therapeutic exercises, 97530- Therapeutic activity, [id number]- Neuromuscular re-education, 97535- Self Care, [id number]- Manual therapy, [id number]- Gait training, [phone]- Orthotic Initial, [phone]- Orthotic/Prosthetic subsequent, [phone]- Canalith repositioning, [id number]- Splinting, H9716- Electrical stimulation (unattended), [phone]- Electrical stimulation (manual), [id number]- Ultrasound, Patient/Family education, Balance training, Stair training, Taping, Joint mobilization, Joint manipulation, Spinal mobilization, Scar mobilization, Vestibular training, DME instructions, Wheelchair mobility training, Cryotherapy, and Moist heat.  PLAN FOR NEXT SESSION:  - Continue with  RLE muscle strengthening - Continue with more weight bearing activities  for RLE with decreased UE support- Consider using blaze pods Continue with overall static and dynamic balance   Dorina HERO. Fairly IV, PT, DPT Physical Therapist- [facility]  [facility] 10:03 AM 01/18/24

## 2024-01-18 NOTE — Therapy (Cosign Needed)
 Occupational Therapy Treatment note   Patient Name: Linda Mason MRN: [id number] DOB:February 07, 1996, 28 y.o., female Today's Date: 01/18/2024  [facility] REFERRING PROVIDER: Toribio Pitch  END OF SESSION:  OT End of Session - 01/18/24 1734     Visit Number 23    Number of Visits 24    Date for OT Re-Evaluation 03/30/24    OT Start Time 0845    OT Stop Time 0933    OT Time Calculation (min) 48 min    Activity Tolerance Patient tolerated treatment well    Behavior During Therapy [facility] for tasks assessed/performed              Past Medical History:  Diagnosis Date   Diabetes mellitus without complication [facility])    Past Surgical History:  Procedure Laterality Date   KNEE ARTHROSCOPY Right 08/27/2023   Procedure: ARTHROSCOPY KNEE;  Surgeon: Tobie Priest, MD;  Location: [facility] ORS;  Service: Orthopedics;  Laterality: Right;   Patient Active Problem List   Diagnosis Date Noted   Closed patellar dislocation, right, initial encounter 09/01/2023   Patellar dislocation 08/26/2023   Type 2 diabetes mellitus without complication, with long-term current use of insulin  (HCC) 08/26/2023   Hypoxia 08/26/2023   Morbid obesity (HCC) 10/09/2021   Diabetes mellitus with neuropathy (HCC) 10/09/2021   Essential hypertension 10/09/2021   Thrombocytopenia (HCC) 10/09/2021   Hypothyroidism 10/09/2021   Prader-Willi syndrome 10/09/2021   Cellulitis 10/08/2021   ONSET DATE: 08/26/2023  REFERRING DIAG: Right knee patella fracture  THERAPY DIAG:  Muscle weakness (generalized)  Other lack of coordination  Rationale for Evaluation and Treatment: Rehabilitation  SUBJECTIVE:   SUBJECTIVE STATEMENT:  Pt. reports that she is excited to use a reacher at home.   Pt accompanied by: Interpreter:  Mother, Spanish speaking interpreter  PERTINENT HISTORY: Linda Mason is a 28 year old right-handed morbidly obese female and BMI 44.88 as well as  diabetes mellitus, Prader-Willi syndrome, intellectual delay. Presented to [facility] ED on 08/26/2023 after mechanical fall getting out of her bed. X-rays and imaging revealed right knee irreducible patellar dislocation right knee lateral femoral condyle impacting fracture as well as right knee medial patella impaction fracture. Pt underwent right knee arthroscopic assisted patellar reduction with lateral release on 08/27/2023 per Dr. Priest Tobie.  Weightbearing as tolerated right lower extremity knee immobilizer when out of bed or when walking. Pt admitted to CIR from 09/02/23 - 09/15/23  PRECAUTIONS: Knee , Knee Immobilizer    WEIGHT BEARING RESTRICTIONS: Yes WBAT  PAIN:  Are you having pain? Yes: NPRS scale: 0/10 Pain location: knee  FALLS: Has patient fallen in last 6 months? No  LIVING ENVIRONMENT: Lives with: lives with their family Lives in: House/apartment Stairs: No ramp Has following equipment at home: Wheelchair (manual)  PLOF: Independent with basic ADLs  PATIENT GOALS: to return to school and go to [employer] with no fear of falling   NEXT MD VISIT: none  OBJECTIVE:  Note: Objective measures were completed at Evaluation unless otherwise noted.  HAND DOMINANCE: Left  ADLs: Transfers/ambulation related to ADLs: SUPERVISION short distances using RW, MOD I using w/c Eating: IND Grooming: IND Upper body dressing: SETUP assist Lower body dressing: MAX A Toileting: SUPERVISION Bathing: MOD A bathing   Tub shower transfers: SUPERVISION Equipment: Shower seat with back, Walk in shower, and bed side commode  UPPER EXTREMITY ROM:     Active ROM Right  [facility] Right 01/06/2024 Left  [facility] Left 01/06/2024  Shoulder Flexion  100 (135)  115(136)  Shoulder Abduction  90(130)  873(864)  Elbow Flexion  115(136)  881(865)  Elbow Extension  [employer]  WFL  Wrist Flexion  32(70)  25(38)-pain   Wrist Extension  36(60)  WFL    UPPER EXTREMITY MMT:   Question accuracy given difficulty following  commands   MMT Right eval Right 01/06/2024 Left eval Left 01/06/2024  Shoulder flexion 3+/5 3-/5 3+/5 3-/5  Shoulder abduction  3-/5  3-/5  Elbow flexion 3+/5 3-/5 3+/5 3-/5  Elbow extension 3+/5 3+/5 3+/5 3+/5  Wrist flexion  3-/5  3-/5  Wrist extension  3+/5  3+/5  (Blank rows = not tested)  HAND FUNCTION: Grip strength: Right: 10 lbs; Left: 15 lbs, Lateral pinch: Right: 4 lbs, Left: 5 lbs, and 3 point pinch: Right: 1 lbs, Left: 1 lbs  01/06/2024 Grip Strength: Right: 13#, Left: 26#, Lateral pinch: Right: 6#, Left: 5#, 3 point pinch: Right: 5#, Left: 4#  COORDINATION: 9 Hole Peg test: Right: 40 sec; Left: 30 sec  01/06/2024 Right: 31 secs, Left: 27 secs  SENSATION: Light touch: Impaired  tingling  EDEMA: none  COGNITION: Overall cognitive status: History of cognitive impairments - at baseline Areas of impairment: Areas of impairment: Problem solving  Commands: Follows one step commands with increased time Behavior: Perseveration and anxious  OBSERVATIONS: Pt appears anxious and looking toward her mother for answers to questions.    TREATMENT DATE: 01/18/2024  Therapeutic Ex.:  -Performed BUE strengthening using a 3# dowel ex. 2/2 to weakness. Bilateral shoulder flexion, chest press, circular patterns, and elbow flexion/extension for 1 set 15 reps each.   Therapeutic Activities:  -Pt. worked on Arts development officer with 1.5# wrist weight on the LUE, and reaching up to move them through a progress ion various heights of vertical dowels on the shape tower,   Self-Care:  -Pt. Demonstrated BUE hand function skills using a reacher with a gross grip handle  to focus on proper hand placement and technique for retrieving items with reacher from floor level.  -To further challenge the task, Pt. used the reacher to grasp 1.5 circular [place]  style discs placed randomly on the floor at various distances from her.       PATIENT EDUCATION: Education details: BUE  strengthening, functional reaching, ROM, grip/pinch strength, and ROM. Person educated: Counselling psychologist, pt Education method: Medical illustrator Education comprehension: verbalized understanding, mother receptive  HOME EXERCISE PROGRAM:  Theraputty  GOALS: Goals reviewed with patient? Yes    SHORT TERM GOALS: Target date: 02/17/2024    Pt will INDEPENDENTLY complete HEP  Baseline: 11/15/2023: Supervision, cues 10/14/23: no HEP issued Goal status: INITIAL  LONG TERM GOALS: Target date: 03/30/2024    Pt will increase B strength by 15# to assist with gripping grab bar for safe transfers   Baseline: 01/06/2024: Grip Strength: R: 13#, L:26#. Pt. Has improved with grasping the grab bars. 11/15/23: Pt. Is able to grip  the grab bar. 10/14/23: L 15#, R 10#  Goal status:  progressing, ongoing  2.  Pt will INDEPENDENTLY transfer to regular height toilet  Baseline: 01/05/3034: MOD I toileting transfers using RW 11/15/23: Modified Independent toileting at home, Supervision using the hospital clinic bathroom Eval: SUPERVISION + RW for elevated toilet, MIN A + RW for regular toilet Goal status: Achieved.   3.  Pt will improve B [facility] by decreasing 9 hole PEG test score by 10 seconds for participation in leisure activities (coloring).  Baseline: 01/06/2024: 9 hole peg test:  R: 31 sec, L: 27 sec. 11/15/23:Continue Eval: L 30, R 40 Goal status: progressing, ongoing  4.  Pt will verbalize x3 strategies to manage anxiousness and fear of falling to reduce fall risk. Baseline: 01/06/2024: Pt. Is now using a walker to complete toileting tasks consistently without fear of falling. 11/15/23: Consistent cues required Eval: 0/3 strategies.  Goal status: Goal achieved.   5.  Pt will increase B lateral pinch strength by 5# to manage ADL items.  Baseline: 01/06/2024: Lateral: R: 6#, L: 5#. Eval: Continue R 4#, L 5# Goal status: Progressing, Ongoing  6. Pt. Will perform LE dressing with Supervision with A/E  use. Baseline: 01/06/2024: A/E reviewed, Pts continues to require assist from her mother to donn shoes and socks. Eval: MinA donning/doffing socks, ModA shoes Goal Status: Ongoing   7. Pt. Will be able to tolerate performing a 15 min. Standing ADL/IADL tasks using a RW without requiring rest breaks    Baseline: 01/06/24: Pt. Requires multiple rest breaks for minimal standing reaching activity  Goal status: NEW   ASSESSMENT:  CLINICAL IMPRESSION:  Pt. tolerated treatment session well today. Pt. tolerated 3# dowel exercises to promote BUE strengthening. Pt. Returned demonstration use of reacher to grab items from floor level. Pt. Presents with fatigue in RUE and has difficulty sustaining a full grip to grab items efficiently with the reacher. Pt. Arrived to treatment session without walker today. Pt. Required 2 rest breaks in between functional reaching tasks while standing. Pt. Tolerated 1.5# wrist weight today on the LUE during functional reaching tasks. Pt. Was able to reach the highest dowel in shape tower task using LUE, focusing on increased shoulder flexion ROM and coordination with placing shapes onto dowels. Pt. continues to benefit from OT services to improve BUE strength/dexterity and to provide ADL training in order to maximize indep with daily tasks.   PERFORMANCE DEFICITS: in functional skills including ADLs, coordination, strength, pain, and balance, cognitive skills including attention and problem solving, and psychosocial skills including coping strategies and environmental adaptation.   IMPAIRMENTS: are limiting patient from ADLs and leisure.   COMORBIDITIES: has co-morbidities such as Prader-Willi syndrome, intellectual delay that affect occupational performance. Patient will benefit from skilled OT to address above impairments and improve overall function.  MODIFICATION OR ASSISTANCE TO COMPLETE EVALUATION: Min-Moderate modification of tasks or assist with assess necessary to  complete an evaluation.  OT OCCUPATIONAL PROFILE AND HISTORY: Detailed assessment: Review of records and additional review of physical, cognitive, psychosocial history related to current functional performance.  CLINICAL DECISION MAKING: Moderate - several treatment options, min-mod task modification necessary  REHAB POTENTIAL: Good  EVALUATION COMPLEXITY: Moderate      PLAN:  OT FREQUENCY: 1-2x/week  OT DURATION: 12 weeks  PLANNED INTERVENTIONS: 97535 self care/ADL training, 02889 therapeutic exercise, 97530 therapeutic activity, patient/family education, and DME and/or AE instructions  RECOMMENDED OTHER SERVICES: PT  CONSULTED AND AGREED WITH PLAN OF CARE: Patient and family member/caregiver  PLAN FOR NEXT SESSION: ADLs   Damien Nap, OTS   This entire session was performed under direct supervision and direction of a licensed therapist/therapist assistant . I have personally read, edited and approve of the note as written.   Richardson Otter, MS, OTR/L    01/18/2024, 5:39 PM

## 2024-01-20 ENCOUNTER — Ambulatory Visit: Payer: MEDICAID

## 2024-01-20 ENCOUNTER — Ambulatory Visit: Payer: MEDICAID | Admitting: Occupational Therapy

## 2024-01-20 DIAGNOSIS — R278 Other lack of coordination: Secondary | ICD-10-CM

## 2024-01-20 DIAGNOSIS — M6281 Muscle weakness (generalized): Secondary | ICD-10-CM | POA: Diagnosis not present

## 2024-01-20 DIAGNOSIS — G8929 Other chronic pain: Secondary | ICD-10-CM

## 2024-01-20 DIAGNOSIS — R262 Difficulty in walking, not elsewhere classified: Secondary | ICD-10-CM

## 2024-01-20 DIAGNOSIS — R2681 Unsteadiness on feet: Secondary | ICD-10-CM

## 2024-01-20 DIAGNOSIS — S83004A Unspecified dislocation of right patella, initial encounter: Secondary | ICD-10-CM

## 2024-01-20 NOTE — Therapy (Addendum)
 Occupational Therapy Treatment note   Patient Name: Linda Mason MRN: [id number] DOB:[birth year], 28 y.o., female Today's Date: 01/20/2024  PCP: [facility] REFERRING PROVIDER: Toribio Pitch  END OF SESSION:  OT End of Session - 01/20/24 1548     Visit Number 24    Number of Visits 48   Date for OT Re-Evaluation 03/30/24    OT Start Time 1015    OT Stop Time 1100    OT Time Calculation (min) 45 min    Activity Tolerance Patient tolerated treatment well    Behavior During Therapy [facility] for tasks assessed/performed               Past Medical History:  Diagnosis Date   Diabetes mellitus without complication (HCC)    Past Surgical History:  Procedure Laterality Date   KNEE ARTHROSCOPY Right 08/27/2023   Procedure: ARTHROSCOPY KNEE;  Surgeon: Tobie Priest, MD;  Location: [facility] ORS;  Service: Orthopedics;  Laterality: Right;   Patient Active Problem List   Diagnosis Date Noted   Closed patellar dislocation, right, initial encounter 09/01/2023   Patellar dislocation 08/26/2023   Type 2 diabetes mellitus without complication, with long-term current use of insulin  (HCC) 08/26/2023   Hypoxia 08/26/2023   Morbid obesity (HCC) 10/09/2021   Diabetes mellitus with neuropathy (HCC) 10/09/2021   Essential hypertension 10/09/2021   Thrombocytopenia (HCC) 10/09/2021   Hypothyroidism 10/09/2021   Prader-Willi syndrome 10/09/2021   Cellulitis 10/08/2021   ONSET DATE: 08/26/2023  REFERRING DIAG: Right knee patella fracture  THERAPY DIAG:  Muscle weakness (generalized)  Other lack of coordination  Rationale for Evaluation and Treatment: Rehabilitation  SUBJECTIVE:   SUBJECTIVE STATEMENT:  Pt. reports that she did not get much sleep last night, and woke up very early this morning for her cardiovascular appointment.   Pt accompanied by: Interpreter:  Mother, Spanish speaking interpreter  PERTINENT HISTORY: Linda Mason is a  28 year old right-handed morbidly obese female and BMI 44.88 as well as diabetes mellitus, Prader-Willi syndrome, intellectual delay. Presented to [facility] ED on 08/26/2023 after mechanical fall getting out of her bed. X-rays and imaging revealed right knee irreducible patellar dislocation right knee lateral femoral condyle impacting fracture as well as right knee medial patella impaction fracture. Pt underwent right knee arthroscopic assisted patellar reduction with lateral release on 08/27/2023 per Dr. Priest Tobie.  Weightbearing as tolerated right lower extremity knee immobilizer when out of bed or when walking. Pt admitted to CIR from 09/02/23 - 09/15/23  PRECAUTIONS: Knee , Knee Immobilizer    WEIGHT BEARING RESTRICTIONS: Yes WBAT  PAIN:  Are you having pain? No pain today.  FALLS: Has patient fallen in last 6 months? No  LIVING ENVIRONMENT: Lives with: lives with their family Lives in: House/apartment Stairs: No ramp Has following equipment at home: Wheelchair (manual)  PLOF: Independent with basic ADLs  PATIENT GOALS: to return to school and go to [employer] with no fear of falling   NEXT MD VISIT: none  OBJECTIVE:  Note: Objective measures were completed at Evaluation unless otherwise noted.  HAND DOMINANCE: Left  ADLs: Transfers/ambulation related to ADLs: SUPERVISION short distances using RW, MOD I using w/c Eating: IND Grooming: IND Upper body dressing: SETUP assist Lower body dressing: MAX A Toileting: SUPERVISION Bathing: MOD A bathing   Tub shower transfers: SUPERVISION Equipment: Shower seat with back, Walk in shower, and bed side commode  UPPER EXTREMITY ROM:     Active ROM Right  [facility] Right 01/06/2024 Left  [facility] Left 01/06/2024  Shoulder Flexion  100 (135)  115(136)  Shoulder Abduction  90(130)  873(864)  Elbow Flexion  115(136)  118(134)  Elbow Extension  [facility]  WFL  Wrist Flexion  32(70)  25(38)-pain   Wrist Extension  36(60)  WFL    UPPER EXTREMITY MMT:    Question accuracy given difficulty following commands   MMT Right eval Right 01/06/2024 Left eval Left 01/06/2024  Shoulder flexion 3+/5 3-/5 3+/5 3-/5  Shoulder abduction  3-/5  3-/5  Elbow flexion 3+/5 3-/5 3+/5 3-/5  Elbow extension 3+/5 3+/5 3+/5 3+/5  Wrist flexion  3-/5  3-/5  Wrist extension  3+/5  3+/5  (Blank rows = not tested)  HAND FUNCTION: Grip strength: Right: 10 lbs; Left: 15 lbs, Lateral pinch: Right: 4 lbs, Left: 5 lbs, and 3 point pinch: Right: 1 lbs, Left: 1 lbs  01/06/2024 Grip Strength: Right: 13#, Left: 26#, Lateral pinch: Right: 6#, Left: 5#, 3 point pinch: Right: 5#, Left: 4#  COORDINATION: 9 Hole Peg test: Right: 40 sec; Left: 30 sec  01/06/2024 Right: 31 secs, Left: 27 secs  SENSATION: Light touch: Impaired  tingling  EDEMA: none  COGNITION: Overall cognitive status: History of cognitive impairments - at baseline Areas of impairment: Areas of impairment: Problem solving  Commands: Follows one step commands with increased time Behavior: Perseveration and anxious  OBSERVATIONS: Pt appears anxious and looking toward her mother for answers to questions.    TREATMENT DATE: 01/20/2024  Therapeutic Ex.:  -Performed BUE strengthening using a 3# dowel ex. 2/2 to weakness. Bilateral shoulder flexion, chest press, circular patterns, and elbow flexion/extension for 1 set 15 reps each to improve strength for ADL/IADL tasks.  -EZ Board exercises performed to target forearm supination/pronation, wrist flexion/extension using gross grasp, and lateral pinch (key) grasp in frontal plane to promote shoulder flexion, abduction, and wrist flexion, and extension while performing resistive wrist flexion and extension with a gross grip.   Therapeutic Activities:  -Pt. Worked on grasping large flat shapes with LUE, and reaching up to move them through a progression  of various heights of vertical dowels.  -To further challenge the task, Pt. worked on grasping large flat  shapes with 2# wrist weight on the RUE, and reaching up to move them through a progression of various heights of vertical dowels on the shape tower.  -Pt. Worked on [facility] skills in combination of working on elbow extension, shoulder flexion and shoulder abduction to place various sized resistive washers onto vertical dowel positioned in multiple planes.        PATIENT EDUCATION: Education details: BUE strengthening, functional reaching, ROM, grip/pinch strength, and ROM. Person educated: Counselling psychologist, pt Education method: Medical illustrator Education comprehension: verbalized understanding, mother receptive  HOME EXERCISE PROGRAM:  Theraputty  GOALS: Goals reviewed with patient? Yes    SHORT TERM GOALS: Target date: 02/17/2024    Pt will INDEPENDENTLY complete HEP  Baseline: 11/15/2023: Supervision, cues 10/14/23: no HEP issued Goal status: INITIAL  LONG TERM GOALS: Target date: 03/30/2024    Pt will increase B strength by 15# to assist with gripping grab bar for safe transfers   Baseline: 01/06/2024: Grip Strength: R: 13#, L:26#. Pt. Has improved with grasping the grab bars. 11/15/23: Pt. Is able to grip  the grab bar. 10/14/23: L 15#, R 10#  Goal status:  progressing, ongoing  2.  Pt will INDEPENDENTLY transfer to regular height toilet  Baseline: 01/05/3034: MOD I toileting transfers using RW 11/15/23: Modified Independent toileting  at home, Supervision using the hospital clinic bathroom Eval: SUPERVISION + RW for elevated toilet, MIN A + RW for regular toilet Goal status: Achieved.   3.  Pt will improve B [facility] by decreasing 9 hole PEG test score by 10 seconds for participation in leisure activities (coloring).  Baseline: 01/06/2024: 9 hole peg test: R: 31 sec, L: 27 sec. 11/15/23:Continue Eval: L 30, R 40 Goal status: progressing, ongoing  4.  Pt will verbalize x3 strategies to manage anxiousness and fear of falling to reduce fall risk. Baseline: 01/06/2024: Pt. Is now using a walker  to complete toileting tasks consistently without fear of falling. 11/15/23: Consistent cues required Eval: 0/3 strategies.  Goal status: Goal achieved.   5.  Pt will increase B lateral pinch strength by 5# to manage ADL items.  Baseline: 01/06/2024: Lateral: R: 6#, L: 5#. Eval: Continue R 4#, L 5# Goal status: Progressing, Ongoing  6. Pt. Will perform LE dressing with Supervision with A/E use. Baseline: 01/06/2024: A/E reviewed, Pts continues to require assist from her mother to donn shoes and socks. Eval: MinA donning/doffing socks, ModA shoes Goal Status: Ongoing   7. Pt. Will be able to tolerate performing a 15 min. Standing ADL/IADL tasks using a RW without requiring rest breaks    Baseline: 01/06/24: Pt. Requires multiple rest breaks for minimal standing reaching activity  Goal status: NEW   ASSESSMENT:  CLINICAL IMPRESSION:  Pt. tolerated treatment session well today. Pt. Presents with increased exhaustion today due to lack of sleep and attending to doctors appointments this morning. Pt. tolerated 3# dowel exercises to promote BUE strengthening. Pt. Continues to have difficulty with standing tolerance during treatment session. Pt. Presents with increased fatigue in BUE during functional reaching tasks. Pt. Required 1 rest break in between sets of functional reaching tasks. Pt. Continues to require min vc and visual demonstration for proper form and technique while performing BUE strengthening, and [facility] skills when manipulating small objects. Pt. Was able to reach the highest dowel in shape tower task using LUE, focusing on increased shoulder flexion ROM and coordination with placing shapes onto dowels. Pt. continues to benefit from OT services to improve BUE strength/dexterity and to provide ADL training in order to maximize indep with daily tasks.   PERFORMANCE DEFICITS: in functional skills including ADLs, coordination, strength, pain, and balance, cognitive skills including attention and  problem solving, and psychosocial skills including coping strategies and environmental adaptation.   IMPAIRMENTS: are limiting patient from ADLs and leisure.   COMORBIDITIES: has co-morbidities such as Prader-Willi syndrome, intellectual delay that affect occupational performance. Patient will benefit from skilled OT to address above impairments and improve overall function.  MODIFICATION OR ASSISTANCE TO COMPLETE EVALUATION: Min-Moderate modification of tasks or assist with assess necessary to complete an evaluation.  OT OCCUPATIONAL PROFILE AND HISTORY: Detailed assessment: Review of records and additional review of physical, cognitive, psychosocial history related to current functional performance.  CLINICAL DECISION MAKING: Moderate - several treatment options, min-mod task modification necessary  REHAB POTENTIAL: Good  EVALUATION COMPLEXITY: Moderate      PLAN:  OT FREQUENCY: 1-2x/week  OT DURATION: 12 weeks  PLANNED INTERVENTIONS: 97535 self care/ADL training, 02889 therapeutic exercise, 97530 therapeutic activity, patient/family education, and DME and/or AE instructions  RECOMMENDED OTHER SERVICES: PT  CONSULTED AND AGREED WITH PLAN OF CARE: Patient and family member/caregiver  PLAN FOR NEXT SESSION: ADLs   Damien Nap, OTS   This entire session was performed under direct supervision and direction of a licensed  therapist/therapist assistant . I have personally read, edited and approve of the note as written.   Richardson Otter, MS, OTR/L    01/20/2024, 3:50 PM

## 2024-01-20 NOTE — Therapy (Signed)
 OUTPATIENT PHYSICAL THERAPY LE TREATMENT    Patient Name: Iqra Rotundo MRN: [id number] DOB:30-Jul-1995, 28 y.o., female Today's Date: 01/20/2024  END OF SESSION:  PT End of Session - 01/20/24 0933     Visit Number 22    Number of Visits 32    Date for PT Re-Evaluation 04/06/24    Progress Note Due on Visit 20    PT Start Time 0931    PT Stop Time 1015    PT Time Calculation (min) 44 min    Equipment Utilized During Treatment Gait belt    Activity Tolerance Patient tolerated treatment well;No increased pain    Behavior During Therapy [facility] for tasks assessed/performed            Past Medical History:  Diagnosis Date   Diabetes mellitus without complication [facility])    Past Surgical History:  Procedure Laterality Date   KNEE ARTHROSCOPY Right 08/27/2023   Procedure: ARTHROSCOPY KNEE;  Surgeon: Tobie Priest, MD;  Location: [facility] ORS;  Service: Orthopedics;  Laterality: Right;   Patient Active Problem List   Diagnosis Date Noted   Closed patellar dislocation, right, initial encounter 09/01/2023   Patellar dislocation 08/26/2023   Type 2 diabetes mellitus without complication, with long-term current use of insulin  (HCC) 08/26/2023   Hypoxia 08/26/2023   Morbid obesity (HCC) 10/09/2021   Diabetes mellitus with neuropathy (HCC) 10/09/2021   Essential hypertension 10/09/2021   Thrombocytopenia (HCC) 10/09/2021   Hypothyroidism 10/09/2021   Prader-Willi syndrome 10/09/2021   Cellulitis 10/08/2021     PCP: [facility]  REFERRING PROVIDER: Pegge Toribio PARAS, PA-C   REFERRING DIAG:  Diagnosis  S83.004A (ICD-10-CM) - Closed patellar dislocation, right, initial encounter    Rationale for Evaluation and Treatment: Rehabilitation  THERAPY DIAG:  Muscle weakness (generalized)  Other lack of coordination  Difficulty in walking, not elsewhere classified  Unsteadiness on feet  Chronic pain of right knee  Closed patellar dislocation, right,  initial encounter  ONSET DATE: 08/26/2023   SUBJECTIVE:                                                                                                                                                                                           SUBJECTIVE STATEMENT Pt continues to ambulate without her RW. Denies falls. No pain. Felt good after last session.    From Eval: The pt is a pleasant 28 yo female. She presents in [facility] with her mom and an interpreter present for PT eval. Pt suffered a R knee dislocation following a fall 08/26/2023. Pt also found to have lateral femoral condyle impacting fx and medial patella impaction  fracture of RLE. She underwent arthroscopic assisted R patellar reduction with lateral release on 08/27/2023. Pt admitted to [facility] from 09/02/23 - 09/15/23/ Pt just had follow-up with surgeon and has been instructed to wean from hinged knee brace/has been released from care; she is WBAT. Pt's mother reports pt using 2WW to ambulate for short distances: maybe ambulating 30-50 steps at most without brace. Pt reports ambulation is limited by R knee pain. Her knee hurts most when walking. She has difficulty completing STS but reports she can do this on her own, although not able to do this yet in her school's bathroom and would like to work on this. Pt was independent with ADLs prior to injury. Her mother is helping her now with dressing. She has not yet used steps to enter her home, is currently using her WC on ramp. They report no other falls. Pt wants to improve ability to ambulate for her school prom. She likes to watch movies.  Pt has Prader-Willi syndrome, intellectual delay.   PERTINENT HISTORY:   PMH also significant for DM II with neuropathy, HTN, thrombocytopenia, hypothyroidism, Prader-Willi syndrome.    PAIN:  Are you having pain? None currently, takes medicine for her knee pain; no other pain  PRECAUTIONS:  Knee - WBAT  RED FLAGS: None   WEIGHT BEARING RESTRICTIONS:   Via surgeon note 1. I recommended the patient be weightbearing as tolerated and wean from the hinged knee brace.  FALLS:  Has patient fallen in last 6 months? No  LIVING ENVIRONMENT: Lives with: lives with their family in one level home Stairs: 2 steps, handrails bilat to enter home, but also has two ramps that she uses to enter home and is not currently using steps Has following equipment at home: RW  OCCUPATION:  Student   PLOF:  Independent  PATIENT GOALS:  Pt wants to walk to be able to go to school prom; she wants to be able to dance.  NEXT MD VISIT: Had follow-up today,  was released from care but can follow-up as needed.    OBJECTIVE:  Note: Objective measures were completed at Evaluation unless otherwise noted.  DIAGNOSTIC FINDINGS:  Via chart, note Tobie Earnestine Clamp, MD R Knee radiographs:  10/18/2023: 2 views (wheelchair bound lateral and oblique radiographs with suboptimal positioning) were obtained. Appropriate images cannot be obtained due to patient's hesitation/unwillingness to transfer to x-ray table. Within these limitations, there does not appear to be a recurrent patellar dislocation. There is significant osteopenia. Impaction fracture about the lateral femoral condyle is also visualized. It does not appear to fully involve the articular weightbearing surface.   PATIENT SURVEYS:  LEFS deferred  COGNITIVE STATUS: Impaired at baseline   SENSATION: WFL BLE  COORDINATION: WFL rapid alt LE, unable to do heel>shin likely limited by body habitus and some hesitance/faer of movement  EDEMA:  Pt R knee still with slight edema around patella, no redness present, scars healing well  POSTURE:  rounded shoulders, increased fwd posture/kyphosis possibly impacted by body habitus    GAIT: Distance walked: (see below) Assistive device utilized: Environmental consultant - 2 wheeled Level of assistance: CGA Comments: Decreased gait speed, antalgic gait, decreased  weightbearing and stance time on RLE, heavy BUE weightbearing through RW    Body Part #1 Knee  PALPATION: No pain with palpation superior, inferior, LTL patella, posterior knee RLE  LOWER EXTREMITY ROM:    Need to complete formal assessment, however pt exhibited to have impaired R hip flexion mobility with MMT,  possibly due positioning but needs further assessment.    LOWER EXTREMITY MMT:    MMT Right eval Left eval  Hip flexion 3- 3+  Hip extension    Hip abduction 4 4  Hip adduction 3* 4  Hip internal rotation    Hip external rotation    Knee flexion * 4  Knee extension 3* 4+  Ankle dorsiflexion 4 4  Ankle plantarflexion    Ankle inversion    Ankle eversion     (Blank rows = not tested) * indicates pain-limited. Pt also might be self-limiting due to fear of pain with movement     FUNCTIONAL TESTS:  5 times sit to stand: 42.5 sec heavy use of UE to RW  6 minute walk test: deferred tp future visit 10 meter walk test: 0.16 sec with BRW  Berg Balance Scale: deferred to future visit                                                                                    TREATMENT DATE: 01/18/24   There.Act:    Resisted gait no AD with 3# AW's donned. 2x150', CGA. VC's for foot clearance.    STS with LLE on block: 3x10 CGA. VC's for hip extension in standing. Good carryover. Holding 1 KG med ball   Alternating cone taps standing on RLE. ~8 with LLE. Heavy RUE support on // bar due to limited strength on RLE. ~5 reps standing on LLE for R knee flexion AROM. Notable ease of completion standing on LLE compared to RLE.   R/L lateral airex pad step up and down: x6/side CGA   Standing heel raises with BUE support on airex pad: 2x8   Reciprocal marches in // bars with BUE support. CGA. X6 laps with 3# AW's donned. SBA.    Resisted gait no AD with 3# AW's donned. 1x150', CGA. Notable fatigue with shuffling gait. Performed additional 150' no AW's with significant improvement in  foot clearance likely due to LE fatigue from today's session.   PATIENT EDUCATION:  Education details: goal reassessment  Person educated: Patient and Parent Education method: Explanation, Demonstration, Tactile cues, and Verbal cues Education comprehension: verbalized understanding, returned demonstration, verbal cues required, tactile cues required, and needs further education  HOME EXERCISE PROGRAM: Reviewed: safe technique, signs and symptoms to watch for that proceeding is OK, stop if feeling increased pain/any sharp pain in knee Access Code: [email or web address] URL: [email or web address] Date: 12/21/2023 Prepared by: Massie Dollar  Exercises - Sit to Stand with Counter Support  - 1 x daily - 7 x weekly - 2 sets - 5 reps - Standing March with Counter Support  - 1 x daily - 3-4 x weekly - 3 sets - 10 reps - Mini Squat with Counter Support  - 1 x daily - 7 x weekly - 3 sets - 10 reps - Standing Knee Flexion with Counter Support  - 1 x daily - 7 x weekly - 3 sets - 10 reps - Standing Hip Abduction with Unilateral Counter Support  - 1 x daily - 7 x weekly - 3 sets - 10 reps - Heel Toe Raises with Counter Support  - 1  x daily - 7 x weekly - 3 sets - 10 reps    ASSESSMENT:  CLINICAL IMPRESSION: Progressing PT POC working on RLE stance and foot clearance and general strengthening. Pt with excellent motivation. Heavy focus today on foot clearance activities and SLS on RLE due to continuous weakness from surgery. Pt does consistently rely on S/BUE support with RLE SLS in particular or any significant hip flexion exercises. Pt will continue to benefit from skilled therapy to address remaining deficits in order to improve overall QoL and return to PLOF.     OBJECTIVE IMPAIRMENTS: Abnormal gait, decreased activity tolerance, decreased balance, decreased coordination, decreased endurance, decreased mobility, difficulty walking, decreased ROM, decreased strength, increased edema, impaired  flexibility, improper body mechanics, postural dysfunction, obesity, and pain.   ACTIVITY LIMITATIONS: carrying, lifting, bending, standing, squatting, stairs, transfers, toileting, dressing, and locomotion level  PARTICIPATION LIMITATIONS: meal prep, cleaning, laundry, shopping, community activity, yard work, and school  PERSONAL FACTORS: Fitness, Sex, and 3+ comorbidities: PMH significant for DM II with neuropathy, HTN, thrombocytopenia, hypothyroidism, Prader-Willi syndrome,  are also affecting patient's functional outcome.   REHAB POTENTIAL: Good  CLINICAL DECISION MAKING: Evolving/moderate complexity  EVALUATION COMPLEXITY: Moderate   GOALS: Goals reviewed with patient? Yes  SHORT TERM GOALS: Target date: 02/24/2024    Patient will be independent in home exercise program to improve strength/mobility for better functional independence with ADLs. Baseline: initiated Goal status: INITIAL    LONG TERM GOALS: Target date: 04/06/2024    Patient will increase lower extremity functional scale to >60/80 to demonstrate improved functional mobility and increased tolerance with ADLs. .  Baseline: deferred; 7/10: 47 Goal status: IN PROGRESS  2.  Patient (> 58 years old) will complete five times sit to stand test in < 15 seconds indicating an increased LE strength and improved balance. Baseline: 42.5 sec, heavy use of BUE 5/21: 14.27 sec.  Heavy use of BUE on RW; 7/10: 14.5 sec light push off armrests, no RW  Goal status: IN PROGRESS  3.  Patient will increase Berg Balance score by > 6 points to demonstrate decreased fall risk during functional activities Baseline: deferred to future session, may need to complete different balance test if pt has difficulty with cuing for test activities  5/21: 33/56: 7/10: 46/56 Goal status: IN PROGRESS  4.  Patient will increase 10 meter walk test to >1.44m/s as to improve gait speed for better community ambulation and to reduce fall  risk. Baseline: 0.16 with BRW 5/21: 0.34m/s with RW  12/02/2023= 0.31 m/s with RW 01/13/24: 0.67 m/s no AD Goal status: IN PROGRESS  5.  Patient will increase six minute walk test distance to >1000 for progression to community ambulator and improve gait ability. Baseline:  280 ft with RW 5/21: 348ft with RW;  7/10: 684 ft without an AD  Goal status: IN PROGRESS    PLAN:  PT FREQUENCY: 1-2x/week  PT DURATION: 12 weeks  PLANNED INTERVENTIONS: [id number]- PT Re-evaluation, 97750- Physical Performance Testing, 97110-Therapeutic exercises, 97530- Therapeutic activity, [id number]- Neuromuscular re-education, 97535- Self Care, [id number]- Manual therapy, [id number]- Gait training, [phone]- Orthotic Initial, [phone]- Orthotic/Prosthetic subsequent, [phone]- Canalith repositioning, [id number]- Splinting, [id number]- Electrical stimulation (unattended), [phone]- Electrical stimulation (manual), [id number]- Ultrasound, Patient/Family education, Balance training, Stair training, Taping, Joint mobilization, Joint manipulation, Spinal mobilization, Scar mobilization, Vestibular training, DME instructions, Wheelchair mobility training, Cryotherapy, and Moist heat.  PLAN FOR NEXT SESSION:  - Continue with  RLE muscle strengthening - Continue with more weight bearing activities for RLE with decreased UE  support- Consider using blaze pods Continue with overall static and dynamic balance   Dorina HERO. Fairly IV, PT, DPT Physical Therapist- [facility] 10:26 AM 01/20/24

## 2024-01-25 ENCOUNTER — Ambulatory Visit: Payer: MEDICAID

## 2024-01-25 ENCOUNTER — Ambulatory Visit: Payer: MEDICAID | Admitting: Occupational Therapy

## 2024-01-27 ENCOUNTER — Telehealth: Payer: Self-pay

## 2024-01-27 ENCOUNTER — Ambulatory Visit: Payer: MEDICAID

## 2024-01-27 ENCOUNTER — Ambulatory Visit: Payer: MEDICAID | Admitting: Occupational Therapy

## 2024-01-27 NOTE — Telephone Encounter (Signed)
 Pt did not arrive for scheduled appointment. No telephone call nor message preceeded this absence. Author attempted to contact pt via telephone number listed in chart. Spanish interpreter Ezella called pt's mother to inquire, mom reports pt having acute illness today, not feeling well, but plans to be here on next scheduled appointment day. Pt will not be here for OT appoint today either.   9:52 AM, 01/27/24 [name], PT, DPT Physical Therapist - [facility]  [phone] [facility])

## 2024-02-01 ENCOUNTER — Ambulatory Visit: Payer: MEDICAID | Admitting: Occupational Therapy

## 2024-02-01 ENCOUNTER — Ambulatory Visit: Payer: MEDICAID

## 2024-02-01 DIAGNOSIS — R2681 Unsteadiness on feet: Secondary | ICD-10-CM

## 2024-02-01 DIAGNOSIS — M6281 Muscle weakness (generalized): Secondary | ICD-10-CM | POA: Diagnosis not present

## 2024-02-01 DIAGNOSIS — G8929 Other chronic pain: Secondary | ICD-10-CM

## 2024-02-01 DIAGNOSIS — R278 Other lack of coordination: Secondary | ICD-10-CM

## 2024-02-01 DIAGNOSIS — R262 Difficulty in walking, not elsewhere classified: Secondary | ICD-10-CM

## 2024-02-01 DIAGNOSIS — S83004A Unspecified dislocation of right patella, initial encounter: Secondary | ICD-10-CM

## 2024-02-01 NOTE — Therapy (Cosign Needed)
 Occupational Therapy Treatment note   Patient Name: Linda Mason MRN: [id number] DOB:[birth year], 28 y.o., female Today's Date: 02/01/2024  PCP: [facility] REFERRING PROVIDER: Toribio Pitch   OT End of Session - 02/01/24 1700     Visit Number 25    Number of Visits 24    Date for OT Re-Evaluation 03/30/24    OT Start Time 1015    OT Stop Time 1100    OT Time Calculation (min) 45 min    Activity Tolerance Patient tolerated treatment well    Behavior During Therapy [facility] for tasks assessed/performed               Past Medical History:  Diagnosis Date   Diabetes mellitus without complication (HCC)    Past Surgical History:  Procedure Laterality Date   KNEE ARTHROSCOPY Right 08/27/2023   Procedure: ARTHROSCOPY KNEE;  Surgeon: Tobie Priest, MD;  Location: [facility] ORS;  Service: Orthopedics;  Laterality: Right;   Patient Active Problem List   Diagnosis Date Noted   Closed patellar dislocation, right, initial encounter 09/01/2023   Patellar dislocation 08/26/2023   Type 2 diabetes mellitus without complication, with long-term current use of insulin  (HCC) 08/26/2023   Hypoxia 08/26/2023   Morbid obesity (HCC) 10/09/2021   Diabetes mellitus with neuropathy (HCC) 10/09/2021   Essential hypertension 10/09/2021   Thrombocytopenia (HCC) 10/09/2021   Hypothyroidism 10/09/2021   Prader-Willi syndrome 10/09/2021   Cellulitis 10/08/2021   ONSET DATE: 08/26/2023  REFERRING DIAG: Right knee patella fracture  THERAPY DIAG:  Muscle weakness (generalized)  Other lack of coordination  Rationale for Evaluation and Treatment: Rehabilitation  SUBJECTIVE:   SUBJECTIVE STATEMENT:  Pt. reports that she was unable to attend therapies last week due to acute illness.  Pt./Caregiver reports LB dressing has improved at home.  Pt accompanied by: Interpreter:  Mother, Spanish speaking interpreter  PERTINENT HISTORY: Linda Mason is a 28 year old  right-handed morbidly obese female and BMI 44.88 as well as diabetes mellitus, Prader-Willi syndrome, intellectual delay. Presented to [facility] ED on 08/26/2023 after mechanical fall getting out of her bed. X-rays and imaging revealed right knee irreducible patellar dislocation right knee lateral femoral condyle impacting fracture as well as right knee medial patella impaction fracture. Pt underwent right knee arthroscopic assisted patellar reduction with lateral release on 08/27/2023 per Dr. Priest Tobie.  Weightbearing as tolerated right lower extremity knee immobilizer when out of bed or when walking. Pt admitted to CIR from 09/02/23 - 09/15/23  PRECAUTIONS: Knee , Knee Immobilizer    WEIGHT BEARING RESTRICTIONS: Yes WBAT  PAIN:  Are you having pain? No pain today.  FALLS: Has patient fallen in last 6 months? No  LIVING ENVIRONMENT: Lives with: lives with their family Lives in: House/apartment Stairs: No ramp Has following equipment at home: Wheelchair (manual)  PLOF: Independent with basic ADLs  PATIENT GOALS: to return to school and go to [employer] with no fear of falling   NEXT MD VISIT: none  OBJECTIVE:  Note: Objective measures were completed at Evaluation unless otherwise noted.  HAND DOMINANCE: Left  ADLs: Transfers/ambulation related to ADLs: SUPERVISION short distances using RW, MOD I using w/c Eating: IND Grooming: IND Upper body dressing: SETUP assist Lower body dressing: MAX A Toileting: SUPERVISION Bathing: MOD A bathing   Tub shower transfers: SUPERVISION Equipment: Shower seat with back, Walk in shower, and bed side commode  UPPER EXTREMITY ROM:     Active ROM Right  [facility] Right 01/06/2024 Left  [facility]  Left 01/06/2024  Shoulder Flexion  100 (135)  115(136)  Shoulder Abduction  90(130)  873(864)  Elbow Flexion  115(136)  118(134)  Elbow Extension  [facility]  WFL  Wrist Flexion  32(70)  25(38)-pain   Wrist Extension  36(60)  WFL    UPPER EXTREMITY MMT:   Question accuracy  given difficulty following commands   MMT Right eval Right 01/06/2024 Left eval Left 01/06/2024  Shoulder flexion 3+/5 3-/5 3+/5 3-/5  Shoulder abduction  3-/5  3-/5  Elbow flexion 3+/5 3-/5 3+/5 3-/5  Elbow extension 3+/5 3+/5 3+/5 3+/5  Wrist flexion  3-/5  3-/5  Wrist extension  3+/5  3+/5  (Blank rows = not tested)  HAND FUNCTION: Grip strength: Right: 10 lbs; Left: 15 lbs, Lateral pinch: Right: 4 lbs, Left: 5 lbs, and 3 point pinch: Right: 1 lbs, Left: 1 lbs  01/06/2024 Grip Strength: Right: 13#, Left: 26#, Lateral pinch: Right: 6#, Left: 5#, 3 point pinch: Right: 5#, Left: 4#  COORDINATION: 9 Hole Peg test: Right: 40 sec; Left: 30 sec  01/06/2024 Right: 31 secs, Left: 27 secs  SENSATION: Light touch: Impaired  tingling  EDEMA: none  COGNITION: Overall cognitive status: History of cognitive impairments - at baseline Areas of impairment: Areas of impairment: Problem solving  Commands: Follows one step commands with increased time Behavior: Perseveration and anxious  OBSERVATIONS: Pt appears anxious and looking toward her mother for answers to questions.    TREATMENT DATE: 02/01/2024  Therapeutic Ex.:  -Performed BUE strengthening using a 3# dowel ex. 2/2 to weakness. Bilateral shoulder flexion, chest press, circular patterns, and elbow flexion/extension for 1 set 15 reps each to improve strength for ADL/IADL tasks.   Therapeutic Activities:  -Performed RUE supination and pronation using playing cards to flip over onto raised cones on raised tabletop surface to promote shoulder flexion in combination of forearm supination/pronation.  -To grade up the task, Pt. Flips playing cards over on raised cones with resisitve clips. Pt. Required mod vc and visual demonstration for proper form and digit placement on resisitve clips.  -To grade the task down, Pt. Was able to slide playing cards into Large paper clip, in preparation to flip card over using the end of the Large paper  clip.   Self-Care:  -Pt. Performed proper use and demonstration of A/E using a 24 reacher, alternating BUE, to grasp various sized objects, including 1.5 [place]  Discs from floor level, then bringing items to opposite hand, following with placing items onto raised tabletop surface using the opposite hand.    PATIENT EDUCATION: Education details: BUE strengthening, functional reaching, ROM, grip/pinch strength, and ROM, A/E use at home Person educated: Parent, pt Education method: Medical illustrator Education comprehension: verbalized understanding, mother receptive  HOME EXERCISE PROGRAM: Pt. To practice A/E use at home with reacher provided during previous treatment session.   GOALS: Goals reviewed with patient? Yes    SHORT TERM GOALS: Target date: 02/17/2024    Pt will INDEPENDENTLY complete HEP  Baseline: 11/15/2023: Supervision, cues 10/14/23: no HEP issued Goal status: INITIAL  LONG TERM GOALS: Target date: 03/30/2024    Pt will increase B strength by 15# to assist with gripping grab bar for safe transfers   Baseline: 01/06/2024: Grip Strength: R: 13#, L:26#. Pt. Has improved with grasping the grab bars. 11/15/23: Pt. Is able to grip  the grab bar. 10/14/23: L 15#, R 10#  Goal status:  progressing, ongoing  2.  Pt will INDEPENDENTLY transfer to regular height toilet  Baseline: 01/05/3034: MOD I toileting transfers using RW 11/15/23: Modified Independent toileting at home, Supervision using the hospital clinic bathroom Eval: SUPERVISION + RW for elevated toilet, MIN A + RW for regular toilet Goal status: Achieved.   3.  Pt will improve B [facility] by decreasing 9 hole PEG test score by 10 seconds for participation in leisure activities (coloring).  Baseline: 01/06/2024: 9 hole peg test: R: 31 sec, L: 27 sec. 11/15/23:Continue Eval: L 30, R 40 Goal status: progressing, ongoing  4.  Pt will verbalize x3 strategies to manage anxiousness and fear of falling to reduce fall  risk. Baseline: 01/06/2024: Pt. Is now using a walker to complete toileting tasks consistently without fear of falling. 11/15/23: Consistent cues required Eval: 0/3 strategies.  Goal status: Goal achieved.   5.  Pt will increase B lateral pinch strength by 5# to manage ADL items.  Baseline: 01/06/2024: Lateral: R: 6#, L: 5#. Eval: Continue R 4#, L 5# Goal status: Progressing, Ongoing  6. Pt. Will perform LE dressing with Supervision with A/E use. Baseline: 01/06/2024: A/E reviewed, Pts continues to require assist from her mother to donn shoes and socks. Eval: MinA donning/doffing socks, ModA shoes Goal Status: Ongoing   7. Pt. Will be able to tolerate performing a 15 min. Standing ADL/IADL tasks using a RW without requiring rest breaks    Baseline: 01/06/24: Pt. Requires multiple rest breaks for minimal standing reaching activity  Goal status: NEW   ASSESSMENT:  CLINICAL IMPRESSION:  Pt. tolerated treatment session well today. Pt. Was able to perform proper demonstration of 24 reacher during treatment session today, however continues to have difficulty with timing the trigger to grasp objects from floor level. Pt. Was able to successfully bring items to the opposite hand in preparation to placing items on raised tabletop surface. Pt. Has difficulty with sustaining pinch using resistive clips to flip playing cards over onto raised cones on raised tabletop surface, task was graded down to using large sized paper clip to hold card and flip playing cards over. Pt. Has difficulty following 2-3 step commands and requires mod vc, tactile cues and visual demonstration for instructions, and proper digit placement onto resistive clips.  Pt. continues to benefit from OT services to improve BUE strength/dexterity and to provide ADL training in order to maximize indep with daily tasks.   PERFORMANCE DEFICITS: in functional skills including ADLs, coordination, strength, pain, and balance, cognitive skills  including attention and problem solving, and psychosocial skills including coping strategies and environmental adaptation.   IMPAIRMENTS: are limiting patient from ADLs and leisure.   COMORBIDITIES: has co-morbidities such as Prader-Willi syndrome, intellectual delay that affect occupational performance. Patient will benefit from skilled OT to address above impairments and improve overall function.  MODIFICATION OR ASSISTANCE TO COMPLETE EVALUATION: Min-Moderate modification of tasks or assist with assess necessary to complete an evaluation.  OT OCCUPATIONAL PROFILE AND HISTORY: Detailed assessment: Review of records and additional review of physical, cognitive, psychosocial history related to current functional performance.  CLINICAL DECISION MAKING: Moderate - several treatment options, min-mod task modification necessary  REHAB POTENTIAL: Good  EVALUATION COMPLEXITY: Moderate      PLAN:  OT FREQUENCY: 1-2x/week  OT DURATION: 12 weeks  PLANNED INTERVENTIONS: 97535 self care/ADL training, 02889 therapeutic exercise, 97530 therapeutic activity, patient/family education, and DME and/or AE instructions  RECOMMENDED OTHER SERVICES: PT  CONSULTED AND AGREED WITH PLAN OF CARE: Patient and family member/caregiver  PLAN FOR NEXT SESSION: ADLs   Damien Nap, OTS  This entire session was performed under direct supervision and direction of a licensed therapist/therapist assistant . I have personally read, edited and approve of the note as written.   Richardson Otter, MS, OTR/L    02/01/2024, 5:07 PM

## 2024-02-01 NOTE — Therapy (Signed)
 OUTPATIENT PHYSICAL THERAPY TREATMENT   Patient Name: Linda Mason MRN: [id number] DOB:03/01/1996, 28 y.o., female Today's Date: 02/01/2024  END OF SESSION:  PT End of Session - 02/01/24 0936     Visit Number 23    Number of Visits 32    Date for PT Re-Evaluation 04/06/24    Progress Note Due on Visit 30    PT Start Time 0930    PT Stop Time 1010    PT Time Calculation (min) 40 min    Equipment Utilized During Treatment Gait belt    Activity Tolerance Patient tolerated treatment well;No increased pain    Behavior During Therapy [facility] for tasks assessed/performed          Past Medical History:  Diagnosis Date   Diabetes mellitus without complication [facility])    Past Surgical History:  Procedure Laterality Date   KNEE ARTHROSCOPY Right 08/27/2023   Procedure: ARTHROSCOPY KNEE;  Surgeon: Tobie Priest, MD;  Location: [facility] ORS;  Service: Orthopedics;  Laterality: Right;   Patient Active Problem List   Diagnosis Date Noted   Closed patellar dislocation, right, initial encounter 09/01/2023   Patellar dislocation 08/26/2023   Type 2 diabetes mellitus without complication, with long-term current use of insulin  (HCC) 08/26/2023   Hypoxia 08/26/2023   Morbid obesity (HCC) 10/09/2021   Diabetes mellitus with neuropathy (HCC) 10/09/2021   Essential hypertension 10/09/2021   Thrombocytopenia (HCC) 10/09/2021   Hypothyroidism 10/09/2021   Prader-Willi syndrome 10/09/2021   Cellulitis 10/08/2021     PCP: [facility] REFERRING PROVIDER: Pegge Toribio PARAS, PA-C  REFERRING DIAG:  Diagnosis  S83.004A (ICD-10-CM) - Closed patellar dislocation, right, initial encounter   Rationale for Evaluation and Treatment: Rehabilitation THERAPY DIAG:  Muscle weakness (generalized)  Other lack of coordination  Difficulty in walking, not elsewhere classified  Unsteadiness on feet  Chronic pain of right knee  Closed patellar dislocation, right, initial  encounter  ONSET DATE: 08/26/2023  SUBJECTIVE:                                                                                                                                                                                          SUBJECTIVE STATEMENT Pt doing well, has been walking well in general. Went to the park.   PERTINENT HISTORY:  28yoF referred to OPPT s/p Rt knee disclocation/fall 08/26/2023. Workup revealing of lateral femoral condyle impacted fx and medial patella impacted fracture. Pt underwent open Rt patellafemoral reduction and lateral release on 08/27/2023. Pt DC to CIR from 09/02/23 - 09/15/23, then DC to home with a hinged knee brace and WBAT. Upon return to home, pt AMB c  RW limited household distances (limited mostly by Rt knee pain), WC for home entry/exit and for longer distances, inability to AMB stairs, difficulty with STS, particularly from her school's toilet. Upon return to home pt required assistance with dressing. Baseline functional status includes: independent with ADL, independent with community AMB. PMH: Prader-Willi syndrome, I/DD, DM II, diabetic neuropathy, HTN, thrombocytopenia, hypothyroidism.  Pt goal:  Pt wants to improve ability to ambulate for her school prom. She likes to watch movies.    PAIN:  Are you having pain? No  PRECAUTIONS:  Knee - WBAT  WEIGHT BEARING RESTRICTIONS:  WBAT, no brace needed   FALLS:  Has patient fallen in last 6 months? Yes (1x)   LIVING ENVIRONMENT: Lives with: lives with their family in one level home Stairs: 2 steps, handrails bilat to enter home, but also has two ramps that she uses to enter home and is not currently using steps Has following equipment at home: RW  OCCUPATION:  Student   PLOF:  Independent  PATIENT GOALS:  Pt wants to walk to be able to go to school prom; she wants to be able to dance.  OBJECTIVE:  Note: Objective measures were completed at Evaluation unless otherwise noted.  DIAGNOSTIC  FINDINGS:  Via chart, note Tobie Earnestine Clamp, MD R Knee radiographs:  10/18/2023: 2 views (wheelchair bound lateral and oblique radiographs with suboptimal positioning) were obtained. Appropriate images cannot be obtained due to patient's hesitation/unwillingness to transfer to x-ray table. Within these limitations, there does not appear to be a recurrent patellar dislocation. There is significant osteopenia. Impaction fracture about the lateral femoral condyle is also visualized. It does not appear to fully involve the articular weightbearing surface.      TREATMENT DATE  02/01/24 :  -STS from plinth 2x10 -4lb AW forward/backward marching 2x47ft  -4lbAW lateral stepping 2x27ft bilat (struggle sto AMB pure side stepping in frontal plane)  -forward/backward marching 3x25ft c redTB at knees  -6 forward step taps x30 c 4lb AW  -balance training in gait: AMB path over 8 high trampoline (with 1 railing assist), red soft mat, and 4 step: carries 1 weighted ball to end of course, then returns each to start using Red, Blue, and Yellow balls. Pt requires 1 HHA for 4  step for first half of activity then advances to minGuard assist. Trampoline is anxiety provoking, but well managed with 1 railing support.    PATIENT EDUCATION:  Education details: goal reassessment  Person educated: Patient and Parent Education method: Explanation, Demonstration, Tactile cues, and Verbal cues Education comprehension: verbalized understanding, returned demonstration, verbal cues required, tactile cues required, and needs further education  HOME EXERCISE PROGRAM: Reviewed: safe technique, signs and symptoms to watch for that proceeding is OK, stop if feeling increased pain/any sharp pain in knee Access Code: [email or web address] URL: [email or web address] Date: 12/21/2023 Prepared by: Massie Dollar  Exercises - Sit to Stand with Counter Support  - 1 x daily - 7 x weekly - 2 sets - 5 reps - Standing March  with Counter Support  - 1 x daily - 3-4 x weekly - 3 sets - 10 reps - Mini Squat with Counter Support  - 1 x daily - 7 x weekly - 3 sets - 10 reps - Standing Knee Flexion with Counter Support  - 1 x daily - 7 x weekly - 3 sets - 10 reps - Standing Hip Abduction with Unilateral Counter Support  - 1 x daily - 7 x weekly - 3 sets -  10 reps - Heel Toe Raises with Counter Support  - 1 x daily - 7 x weekly - 3 sets - 10 reps  ASSESSMENT:  CLINICAL IMPRESSION: Continued with strength activities, well tolerated from a strength standpoint. Pt does best with functional exercises rather than more compicated multi step exercises: for example, difficulty side stepping in pure frontal plane. Actrivity balance course very revealing of remaining unsteadiness and limited confidence over basic community based obstacles (curb step, thick grass, random community trampolines.) Pt will continue to benefit from skilled therapy to address remaining deficits in order to improve overall QoL and return to PLOF.     OBJECTIVE IMPAIRMENTS: Abnormal gait, decreased activity tolerance, decreased balance, decreased coordination, decreased endurance, decreased mobility, difficulty walking, decreased ROM, decreased strength, increased edema, impaired flexibility, improper body mechanics, postural dysfunction, obesity, and pain.  ACTIVITY LIMITATIONS: carrying, lifting, bending, standing, squatting, stairs, transfers, toileting, dressing, and locomotion level PARTICIPATION LIMITATIONS: meal prep, cleaning, laundry, shopping, community activity, yard work, and school PERSONAL FACTORS: Fitness, Sex, and 3+ comorbidities: PMH significant for DM II with neuropathy, HTN, thrombocytopenia, hypothyroidism, Prader-Willi syndrome,  are also affecting patient's functional outcome.  REHAB POTENTIAL: Good CLINICAL DECISION MAKING: Evolving/moderate complexity EVALUATION COMPLEXITY: Moderate   GOALS: Goals reviewed with patient? Yes  SHORT  TERM GOALS: Target date: 02/24/2024  Patient will be independent in home exercise program to improve strength/mobility for better functional independence with ADLs. Baseline: initiated Goal status: INITIAL  LONG TERM GOALS: Target date: 04/06/2024  Patient will increase lower extremity functional scale to >60/80 to demonstrate improved functional mobility and increased tolerance with ADLs. .  Baseline: deferred; 7/10: 47 Goal status: IN PROGRESS  2.  Patient (> 10 years old) will complete five times sit to stand test in < 15 seconds indicating an increased LE strength and improved balance. Baseline: 42.5 sec, heavy use of BUE 5/21: 14.27 sec.  Heavy use of BUE on RW; 7/10: 14.5 sec light push off armrests, no RW  Goal status: IN PROGRESS  3.  Patient will increase Berg Balance score by > 6 points to demonstrate decreased fall risk during functional activities Baseline: deferred to future session, may need to complete different balance test if pt has difficulty with cuing for test activities  5/21: 33/56: 7/10: 46/56 Goal status: IN PROGRESS  4.  Patient will increase 10 meter walk test to >1.70m/s as to improve gait speed for better community ambulation and to reduce fall risk. Baseline: 0.16 with BRW 5/21: 0.53m/s with RW  12/02/2023= 0.31 m/s with RW 01/13/24: 0.67 m/s no AD Goal status: IN PROGRESS  5.  Patient will increase six minute walk test distance to >1000 for progression to community ambulator and improve gait ability. Baseline:  280 ft with RW 5/21: 323ft with RW;  7/10: 684 ft without an AD  Goal status: IN PROGRESS  PLAN:  PT FREQUENCY: 1-2x/week  PT DURATION: 12 weeks  PLANNED INTERVENTIONS: [id number]- PT Re-evaluation, 97750- Physical Performance Testing, 97110-Therapeutic exercises, 97530- Therapeutic activity, [id number]- Neuromuscular re-education, 97535- Self Care, 02859- Manual therapy, [id number]- Gait training, [phone]- Orthotic Initial, [phone]- Orthotic/Prosthetic  subsequent, [phone]- Canalith repositioning, [id number]- Splinting, [id number]- Electrical stimulation (unattended), [phone]- Electrical stimulation (manual), [id number]- Ultrasound, Patient/Family education, Balance training, Stair training, Taping, Joint mobilization, Joint manipulation, Spinal mobilization, Scar mobilization, Vestibular training, DME instructions, Wheelchair mobility training, Cryotherapy, and Moist heat.  PLAN FOR NEXT SESSION: -faster walking -longer distance walking tolernace -community navigation balance/stability multitasking   9:40 AM, 02/01/24 [name], PT, DPT Physical  Therapist - [facility]  Outpatient Physical Therapy- Main Campus [phone]

## 2024-02-03 ENCOUNTER — Ambulatory Visit: Payer: MEDICAID | Admitting: Occupational Therapy

## 2024-02-03 ENCOUNTER — Ambulatory Visit: Payer: MEDICAID

## 2024-02-03 DIAGNOSIS — R278 Other lack of coordination: Secondary | ICD-10-CM

## 2024-02-03 DIAGNOSIS — R2681 Unsteadiness on feet: Secondary | ICD-10-CM

## 2024-02-03 DIAGNOSIS — M6281 Muscle weakness (generalized): Secondary | ICD-10-CM

## 2024-02-03 DIAGNOSIS — R262 Difficulty in walking, not elsewhere classified: Secondary | ICD-10-CM

## 2024-02-03 DIAGNOSIS — G8929 Other chronic pain: Secondary | ICD-10-CM

## 2024-02-03 DIAGNOSIS — S83004A Unspecified dislocation of right patella, initial encounter: Secondary | ICD-10-CM

## 2024-02-03 NOTE — Therapy (Signed)
 OUTPATIENT PHYSICAL THERAPY TREATMENT  Patient Name: Linda Mason MRN: [id number] DOB:March 05, 1996, 28 y.o., female Today's Date: 02/03/2024  END OF SESSION:  PT End of Session - 02/03/24 0944     Visit Number 24    Number of Visits 32    Date for PT Re-Evaluation 04/06/24    Progress Note Due on Visit 30    PT Start Time 0930    PT Stop Time 1010    PT Time Calculation (min) 40 min    Equipment Utilized During Treatment Gait belt    Activity Tolerance Patient tolerated treatment well;No increased pain    Behavior During Therapy [facility] for tasks assessed/performed          PT End of Session - 02/03/24 0944     Visit Number 24    Number of Visits 32    Date for PT Re-Evaluation 04/06/24    Progress Note Due on Visit 30    PT Start Time 0930    PT Stop Time 1010    PT Time Calculation (min) 40 min    Equipment Utilized During Treatment Gait belt    Activity Tolerance Patient tolerated treatment well;No increased pain    Behavior During Therapy [facility] for tasks assessed/performed            Past Medical History:  Diagnosis Date   Diabetes mellitus without complication [facility])    Past Surgical History:  Procedure Laterality Date   KNEE ARTHROSCOPY Right 08/27/2023   Procedure: ARTHROSCOPY KNEE;  Surgeon: Tobie Priest, MD;  Location: [facility] ORS;  Service: Orthopedics;  Laterality: Right;   Patient Active Problem List   Diagnosis Date Noted   Closed patellar dislocation, right, initial encounter 09/01/2023   Patellar dislocation 08/26/2023   Type 2 diabetes mellitus without complication, with long-term current use of insulin  (HCC) 08/26/2023   Hypoxia 08/26/2023   Morbid obesity (HCC) 10/09/2021   Diabetes mellitus with neuropathy (HCC) 10/09/2021   Essential hypertension 10/09/2021   Thrombocytopenia (HCC) 10/09/2021   Hypothyroidism 10/09/2021   Prader-Willi syndrome 10/09/2021   Cellulitis 10/08/2021    PCP: [facility] REFERRING PROVIDER:  Pegge Toribio PARAS, PA-C  REFERRING DIAG:  Diagnosis  S83.004A (ICD-10-CM) - Closed patellar dislocation, right, initial encounter   Rationale for Evaluation and Treatment: Rehabilitation THERAPY DIAG:  Muscle weakness (generalized)  Other lack of coordination  Difficulty in walking, not elsewhere classified  Unsteadiness on feet  Chronic pain of right knee  Closed patellar dislocation, right, initial encounter  ONSET DATE: 08/26/2023  SUBJECTIVE:  SUBJECTIVE STATEMENT Pt doing well. No significant updates, felt good after last session.    PERTINENT HISTORY:  28yoF referred to OPPT s/p Rt knee disclocation/fall 08/26/2023. Workup revealing of lateral femoral condyle impacted fx and medial patella impacted fracture. Pt underwent open Rt patellafemoral reduction and lateral release on 08/27/2023. Pt DC to CIR from 09/02/23 - 09/15/23, then DC to home with a hinged knee brace and WBAT. Upon return to home, pt AMB c RW limited household distances (limited mostly by Rt knee pain), WC for home entry/exit and for longer distances, inability to AMB stairs, difficulty with STS, particularly from her school's toilet. Upon return to home pt required assistance with dressing. Baseline functional status includes: independent with ADL, independent with community AMB. PMH: Prader-Willi syndrome, I/DD, DM II, diabetic neuropathy, HTN, thrombocytopenia, hypothyroidism.  Pt goal:  Pt wants to improve ability to ambulate for her school prom. She likes to watch movies.    PAIN:  Are you having pain? No  PRECAUTIONS:  Knee - WBAT  WEIGHT BEARING RESTRICTIONS:  WBAT, no brace needed   FALLS:  Has patient fallen in last 6 months? Yes (1x)   LIVING ENVIRONMENT: Lives with: lives with their family in one level  home Stairs: 2 steps, handrails bilat to enter home, but also has two ramps that she uses to enter home and is not currently using steps Has following equipment at home: RW  OCCUPATION:  Student   PLOF:  Independent  PATIENT GOALS:  Pt wants to walk to be able to go to school prom; she wants to be able to dance.  OBJECTIVE:  Note: Objective measures were completed at Evaluation unless otherwise noted.  DIAGNOSTIC FINDINGS:  Via chart, note Tobie Earnestine Clamp, MD R Knee radiographs:  10/18/2023: 2 views (wheelchair bound lateral and oblique radiographs with suboptimal positioning) were obtained. Appropriate images cannot be obtained due to patient's hesitation/unwillingness to transfer to x-ray table. Within these limitations, there does not appear to be a recurrent patellar dislocation. There is significant osteopenia. Impaction fracture about the lateral femoral condyle is also visualized. It does not appear to fully involve the articular weightbearing surface.     TREATMENT DATE  02/03/24 :  -STS from chair 1x10 (hands free)  -seated LAQ 1x15 bilat 0lb AW  -AMB overground 362ft, no device, 2lb AW bilat: 41m31sec  (CCW) -STS from chair 1x10  -seated LAQ 1x15 bilat 0lb AW -AMB overground 340ft, no device, 2lb AW bilat: 53m16sec (CW)  -STS from chair 1x10, hands free -forward step up x10 bilat, forward step down x10 bilat  -AMB overground 35ft, no device, 2lb AW 38m35sec -baseball grip cable walk out 7.5lb resistance 3x back, 3x eccentric forwward -lateral stepping in hallway with green ball rebounding 1x20 -forward AMB c green ball toss/catch x14ft   PATIENT EDUCATION:  Education details: goal reassessment  Person educated: Patient and Parent Education method: Explanation, Demonstration, Tactile cues, and Verbal cues Education comprehension: verbalized understanding, returned demonstration, verbal cues required, tactile cues required, and needs further education  HOME  EXERCISE PROGRAM: Reviewed: safe technique, signs and symptoms to watch for that proceeding is OK, stop if feeling increased pain/any sharp pain in knee Access Code: [email or web address] URL: [email or web address] Date: 12/21/2023 Prepared by: Massie Dollar  Exercises - Sit to Stand with Counter Support  - 1 x daily - 7 x weekly - 2 sets - 5 reps - Standing March with Counter Support  - 1 x daily - 3-4 x weekly - 3 sets -  10 reps - Mini Squat with Counter Support  - 1 x daily - 7 x weekly - 3 sets - 10 reps - Standing Knee Flexion with Counter Support  - 1 x daily - 7 x weekly - 3 sets - 10 reps - Standing Hip Abduction with Unilateral Counter Support  - 1 x daily - 7 x weekly - 3 sets - 10 reps - Heel Toe Raises with Counter Support  - 1 x daily - 7 x weekly - 3 sets - 10 reps  ASSESSMENT:  CLINICAL IMPRESSION: Continued with strength activities, well tolerated from a strength standpoint. Pt continues to build confidence for use of RLE for steps. Rest breaks provided as needed. Pt AMB thrice with AW resistance at self selected pace, however these walks grow progressively faster without cue. Pt will continue to benefit from skilled therapy to address remaining deficits in order to improve overall QoL and return to PLOF.     OBJECTIVE IMPAIRMENTS: Abnormal gait, decreased activity tolerance, decreased balance, decreased coordination, decreased endurance, decreased mobility, difficulty walking, decreased ROM, decreased strength, increased edema, impaired flexibility, improper body mechanics, postural dysfunction, obesity, and pain.  ACTIVITY LIMITATIONS: carrying, lifting, bending, standing, squatting, stairs, transfers, toileting, dressing, and locomotion level PARTICIPATION LIMITATIONS: meal prep, cleaning, laundry, shopping, community activity, yard work, and school PERSONAL FACTORS: Fitness, Sex, and 3+ comorbidities: PMH significant for DM II with neuropathy, HTN, thrombocytopenia,  hypothyroidism, Prader-Willi syndrome,  are also affecting patient's functional outcome.  REHAB POTENTIAL: Good CLINICAL DECISION MAKING: Evolving/moderate complexity EVALUATION COMPLEXITY: Moderate   GOALS: Goals reviewed with patient? Yes  SHORT TERM GOALS: Target date: 02/24/2024  Patient will be independent in home exercise program to improve strength/mobility for better functional independence with ADLs. Baseline: initiated Goal status: INITIAL  LONG TERM GOALS: Target date: 04/06/2024  Patient will increase lower extremity functional scale to >60/80 to demonstrate improved functional mobility and increased tolerance with ADLs. .  Baseline: deferred; 7/10: 47 Goal status: IN PROGRESS  2.  Patient (> 69 years old) will complete five times sit to stand test in < 15 seconds indicating an increased LE strength and improved balance. Baseline: 42.5 sec, heavy use of BUE 5/21: 14.27 sec.  Heavy use of BUE on RW; 7/10: 14.5 sec light push off armrests, no RW  Goal status: IN PROGRESS  3.  Patient will increase Berg Balance score by > 6 points to demonstrate decreased fall risk during functional activities Baseline: deferred to future session, may need to complete different balance test if pt has difficulty with cuing for test activities  5/21: 33/56: 7/10: 46/56 Goal status: IN PROGRESS  4.  Patient will increase 10 meter walk test to >1.89m/s as to improve gait speed for better community ambulation and to reduce fall risk. Baseline: 0.16 with BRW 5/21: 0.44m/s with RW  12/02/2023= 0.31 m/s with RW 01/13/24: 0.67 m/s no AD Goal status: IN PROGRESS  5.  Patient will increase six minute walk test distance to >1000 for progression to community ambulator and improve gait ability. Baseline:  280 ft with RW 5/21: 339ft with RW;  7/10: 684 ft without an AD  Goal status: IN PROGRESS  PLAN:  PT FREQUENCY: 1-2x/week  PT DURATION: 12 weeks  PLANNED INTERVENTIONS: [id number]- PT  Re-evaluation, 97750- Physical Performance Testing, 97110-Therapeutic exercises, 97530- Therapeutic activity, [id number]- Neuromuscular re-education, 97535- Self Care, 02859- Manual therapy, [id number]- Gait training, [id number]- Orthotic Initial, [id number]- Orthotic/Prosthetic subsequent, [phone]- Canalith repositioning, [id number]- Splinting, H9716- Electrical stimulation (unattended), [id number]- Electrical stimulation (  manual), 02964- Ultrasound, Patient/Family education, Balance training, Stair training, Taping, Joint mobilization, Joint manipulation, Spinal mobilization, Scar mobilization, Vestibular training, DME instructions, Wheelchair mobility training, Cryotherapy, and Moist heat.  PLAN FOR NEXT SESSION: -faster walking -longer distance walking tolernace -community navigation balance/stability multitasking   9:45 AM, 02/03/24 [name], PT, DPT Physical Therapist - [facility]  Outpatient Physical Therapy- Main Campus [phone]

## 2024-02-03 NOTE — Therapy (Addendum)
 Occupational Therapy Treatment note   Patient Name: Linda Mason MRN: [id number] DOB:1996-01-02, 28 y.o., female Today's Date: 02/03/2024  PCP: [facility] REFERRING PROVIDER: Toribio Pitch   OT End of Session - 02/03/24 1533     Visit Number 26    Number of Visits 24    Date for OT Re-Evaluation 03/30/24    OT Start Time 1015    OT Stop Time 1100    OT Time Calculation (min) 45 min    Activity Tolerance Patient tolerated treatment well    Behavior During Therapy [facility] for tasks assessed/performed               Past Medical History:  Diagnosis Date   Diabetes mellitus without complication [facility])    Past Surgical History:  Procedure Laterality Date   KNEE ARTHROSCOPY Right 08/27/2023   Procedure: ARTHROSCOPY KNEE;  Surgeon: Tobie Priest, MD;  Location: [facility] ORS;  Service: Orthopedics;  Laterality: Right;   Patient Active Problem List   Diagnosis Date Noted   Closed patellar dislocation, right, initial encounter 09/01/2023   Patellar dislocation 08/26/2023   Type 2 diabetes mellitus without complication, with long-term current use of insulin  (HCC) 08/26/2023   Hypoxia 08/26/2023   Morbid obesity (HCC) 10/09/2021   Diabetes mellitus with neuropathy (HCC) 10/09/2021   Essential hypertension 10/09/2021   Thrombocytopenia (HCC) 10/09/2021   Hypothyroidism 10/09/2021   Prader-Willi syndrome 10/09/2021   Cellulitis 10/08/2021   ONSET DATE: 08/26/2023  REFERRING DIAG: Right knee patella fracture  THERAPY DIAG:  Muscle weakness (generalized)  Other lack of coordination  Rationale for Evaluation and Treatment: Rehabilitation  SUBJECTIVE:   SUBJECTIVE STATEMENT:  Pt. Reports that she will be taking a test at school tomorrow.   Pt accompanied by: Interpreter:  Father,  Spanish speaking interpreter  PERTINENT HISTORY: Linda Mason is a 28 year old right-handed morbidly obese female and BMI 44.88 as well as diabetes  mellitus, Prader-Willi syndrome, intellectual delay. Presented to [facility] ED on 08/26/2023 after mechanical fall getting out of her bed. X-rays and imaging revealed right knee irreducible patellar dislocation right knee lateral femoral condyle impacting fracture as well as right knee medial patella impaction fracture. Pt underwent right knee arthroscopic assisted patellar reduction with lateral release on 08/27/2023 per Dr. Priest Tobie.  Weightbearing as tolerated right lower extremity knee immobilizer when out of bed or when walking. Pt admitted to CIR from 09/02/23 - 09/15/23  PRECAUTIONS: Knee , Knee Immobilizer    WEIGHT BEARING RESTRICTIONS: Yes WBAT  PAIN:  Are you having pain? No pain today.  FALLS: Has patient fallen in last 6 months? No  LIVING ENVIRONMENT: Lives with: lives with their family Lives in: House/apartment Stairs: No ramp Has following equipment at home: Wheelchair (manual)  PLOF: Independent with basic ADLs  PATIENT GOALS: to return to school and go to [employer] with no fear of falling   NEXT MD VISIT: none  OBJECTIVE:  Note: Objective measures were completed at Evaluation unless otherwise noted.  HAND DOMINANCE: Left  ADLs: Transfers/ambulation related to ADLs: SUPERVISION short distances using RW, MOD I using w/c Eating: IND Grooming: IND Upper body dressing: SETUP assist Lower body dressing: MAX A Toileting: SUPERVISION Bathing: MOD A bathing   Tub shower transfers: SUPERVISION Equipment: Shower seat with back, Walk in shower, and bed side commode  UPPER EXTREMITY ROM:     Active ROM Right  [facility] Right 01/06/2024 Left  [facility] Left 01/06/2024  Shoulder Flexion  100 (135)  115(136)  Shoulder Abduction  90(130)  [id number])  Elbow Flexion  115(136)  [id number])  Elbow Extension  [facility]  WFL  Wrist Flexion  32(70)  25(38)-pain   Wrist Extension  36(60)  WFL    UPPER EXTREMITY MMT:   Question accuracy given difficulty following commands   MMT Right eval  Right 01/06/2024 Left eval Left 01/06/2024  Shoulder flexion 3+/5 3-/5 3+/5 3-/5  Shoulder abduction  3-/5  3-/5  Elbow flexion 3+/5 3-/5 3+/5 3-/5  Elbow extension 3+/5 3+/5 3+/5 3+/5  Wrist flexion  3-/5  3-/5  Wrist extension  3+/5  3+/5  (Blank rows = not tested)  HAND FUNCTION: Grip strength: Right: 10 lbs; Left: 15 lbs, Lateral pinch: Right: 4 lbs, Left: 5 lbs, and 3 point pinch: Right: 1 lbs, Left: 1 lbs  01/06/2024 Grip Strength: Right: 13#, Left: 26#, Lateral pinch: Right: 6#, Left: 5#, 3 point pinch: Right: 5#, Left: 4#  COORDINATION: 9 Hole Peg test: Right: 40 sec; Left: 30 sec  01/06/2024 Right: 31 secs, Left: 27 secs  SENSATION: Light touch: Impaired  tingling  EDEMA: none  COGNITION: Overall cognitive status: History of cognitive impairments - at baseline Areas of impairment: Areas of impairment: Problem solving  Commands: Follows one step commands with increased time Behavior: Perseveration and anxious  OBSERVATIONS: Pt appears anxious and looking toward her mother for answers to questions.    TREATMENT DATE: 02/03/2024  Therapeutic Ex.:  -Performed BUE strengthening using a 3# dowel ex. 2/2 to weakness. Bilateral shoulder flexion, chest press, circular patterns, and elbow flexion/extension for 1 set 15 reps each to improve strength for ADL/IADL tasks.  -Pt. Performed Bilateral wrist strengthening using a 2# dumbbell for reps of wrist flexion/extension.   Therapeutic Activities:  -Pt. Performed Alternating BUE Gross Gripped using a tennis ball with a slotted target, requiring pt. to sustain gross grip with L hand, while using the R hand to grasp 1/8 beads, and 1 beads to place into slotted target of tennis ball.  -Pt. Performed R Lateral, and 3pt. Pinch strengthening using yellow, red, green resisitve clips, with 2-3 reps of lateral and 3 pt. Pinch, to apply to vertical dowel.  -Pt. Removed yellow, red and green resisitve clips from vertical dowel using a  lateral pinch to remove clips.  -Pt. Performed [facility] skills to manipulate and remove resisitve washers from magnetic dish, in preparation for placing washers onto vertical/horizontal dowels placed in multiple planes to promote a combination of movements with functional reaching and FMC.   Neuromuscular re-education:  Pt. Performed Bilateral [facility] skills using 1/2 resisitve connective beads to create a chain by clasping beads together, alternating R and L hand to stabilize one end of the connective bead chain, and then using the other hand to clasp beads.      PATIENT EDUCATION: Education details: BUE strengthening, functional reaching, ROM, grip/pinch strength, and ROM, A/E use at home Person educated: Parent, pt Education method: Medical illustrator Education comprehension: verbalized understanding, mother receptive  HOME EXERCISE PROGRAM: Pt. To practice A/E use at home with reacher provided during previous treatment session.   GOALS: Goals reviewed with patient? Yes    SHORT TERM GOALS: Target date: 02/17/2024    Pt will INDEPENDENTLY complete HEP  Baseline: 11/15/2023: Supervision, cues 10/14/23: no HEP issued Goal status: INITIAL  LONG TERM GOALS: Target date: 03/30/2024    Pt will increase B strength by 15# to assist with gripping grab bar for safe transfers   Baseline: 01/06/2024: Grip Strength: R: 13#,  L:26#. Pt. Has improved with grasping the grab bars. 11/15/23: Pt. Is able to grip  the grab bar. 10/14/23: L 15#, R 10#  Goal status:  progressing, ongoing  2.  Pt will INDEPENDENTLY transfer to regular height toilet  Baseline: 01/05/3034: MOD I toileting transfers using RW 11/15/23: Modified Independent toileting at home, Supervision using the hospital clinic bathroom Eval: SUPERVISION + RW for elevated toilet, MIN A + RW for regular toilet Goal status: Achieved.   3.  Pt will improve B FMC by decreasing 9 hole PEG test score by 10 seconds for participation in leisure  activities (coloring).  Baseline: 01/06/2024: 9 hole peg test: R: 31 sec, L: 27 sec. 11/15/23:Continue Eval: L 30, R 40 Goal status: progressing, ongoing  4.  Pt will verbalize x3 strategies to manage anxiousness and fear of falling to reduce fall risk. Baseline: 01/06/2024: Pt. Is now using a walker to complete toileting tasks consistently without fear of falling. 11/15/23: Consistent cues required Eval: 0/3 strategies.  Goal status: Goal achieved.   5.  Pt will increase B lateral pinch strength by 5# to manage ADL items.  Baseline: 01/06/2024: Lateral: R: 6#, L: 5#. Eval: Continue R 4#, L 5# Goal status: Progressing, Ongoing  6. Pt. Will perform LE dressing with Supervision with A/E use. Baseline: 01/06/2024: A/E reviewed, Pts continues to require assist from her mother to donn shoes and socks. Eval: MinA donning/doffing socks, ModA shoes Goal Status: Ongoing   7. Pt. Will be able to tolerate performing a 15 min. Standing ADL/IADL tasks using a RW without requiring rest breaks    Baseline: 01/06/24: Pt. Requires multiple rest breaks for minimal standing reaching activity  Goal status: NEW   ASSESSMENT:  CLINICAL IMPRESSION:  Pt. Arrived to treatment session with father and spanish speaking interpreter. Pt. Requested to work on 3 pt. And lateral pinch strengthening task during treatment session today. Pt. Presents with increased fatigue during treatment session today. Pt. Continues to be able to tolerate 3# BUE dowel exercises, and 2# dumbbell exercises. Pt. Requires multimodal cuing during BUE strengthening exercises, using 3# weighted dowel and 2# dumbbell to promote controlled movements. Pt. Continues to present with difficulty sustaining grip while discarding objects from slotted target of tennis ball. Pt. Presents with difficulty when performing 2 pt. And lateral pinch while clasping resistive connective beads together to form a chain of beads. Pt. Was unable to tolerate and perform 3 pt. And  lateral key pinch strengthening tasks using blue and black resisitve clips due to 6/10 pain MP of the 2nd digit while performing lateral key pinch. Pt. Continues to require multimodal cuing for proper digit placement when using resisitve clips.  Pt. continues to benefit from OT services to improve BUE strength/dexterity and to provide ADL training in order to maximize indep with daily tasks.   PERFORMANCE DEFICITS: in functional skills including ADLs, coordination, strength, pain, and balance, cognitive skills including attention and problem solving, and psychosocial skills including coping strategies and environmental adaptation.   IMPAIRMENTS: are limiting patient from ADLs and leisure.   COMORBIDITIES: has co-morbidities such as Prader-Willi syndrome, intellectual delay that affect occupational performance. Patient will benefit from skilled OT to address above impairments and improve overall function.  MODIFICATION OR ASSISTANCE TO COMPLETE EVALUATION: Min-Moderate modification of tasks or assist with assess necessary to complete an evaluation.  OT OCCUPATIONAL PROFILE AND HISTORY: Detailed assessment: Review of records and additional review of physical, cognitive, psychosocial history related to current functional performance.  CLINICAL DECISION MAKING:  Moderate - several treatment options, min-mod task modification necessary  REHAB POTENTIAL: Good  EVALUATION COMPLEXITY: Moderate      PLAN:  OT FREQUENCY: 1-2x/week  OT DURATION: 12 weeks  PLANNED INTERVENTIONS: 97535 self care/ADL training, 02889 therapeutic exercise, 97530 therapeutic activity, patient/family education, and DME and/or AE instructions  RECOMMENDED OTHER SERVICES: PT  CONSULTED AND AGREED WITH PLAN OF CARE: Patient and family member/caregiver  PLAN FOR NEXT SESSION: ADLs   Damien Nap, OTS   This entire session was performed under direct supervision and direction of a licensed therapist/therapist  assistant . I have personally read, edited and approve of the note as written.   Richardson Otter, MS, OTR/L    02/03/2024, 4:05 PM

## 2024-02-08 ENCOUNTER — Ambulatory Visit: Payer: MEDICAID | Admitting: Occupational Therapy

## 2024-02-08 ENCOUNTER — Ambulatory Visit: Payer: MEDICAID | Attending: Physician Assistant

## 2024-02-08 DIAGNOSIS — G8929 Other chronic pain: Secondary | ICD-10-CM | POA: Insufficient documentation

## 2024-02-08 DIAGNOSIS — R262 Difficulty in walking, not elsewhere classified: Secondary | ICD-10-CM | POA: Diagnosis present

## 2024-02-08 DIAGNOSIS — R278 Other lack of coordination: Secondary | ICD-10-CM

## 2024-02-08 DIAGNOSIS — R2681 Unsteadiness on feet: Secondary | ICD-10-CM | POA: Diagnosis present

## 2024-02-08 DIAGNOSIS — M6281 Muscle weakness (generalized): Secondary | ICD-10-CM | POA: Insufficient documentation

## 2024-02-08 DIAGNOSIS — M25561 Pain in right knee: Secondary | ICD-10-CM | POA: Diagnosis present

## 2024-02-08 DIAGNOSIS — S83004A Unspecified dislocation of right patella, initial encounter: Secondary | ICD-10-CM | POA: Diagnosis present

## 2024-02-08 NOTE — Therapy (Signed)
 OUTPATIENT PHYSICAL THERAPY TREATMENT  Patient Name: Linda Mason MRN: [id number] DOB:06/11/1996, 28 y.o., female Today's Date: 02/08/2024  END OF SESSION:  PT End of Session - 02/08/24 0934     Visit Number 25    Number of Visits 32    Date for PT Re-Evaluation 04/06/24    Progress Note Due on Visit 30    PT Start Time 0930    PT Stop Time 1010    PT Time Calculation (min) 40 min    Equipment Utilized During Treatment Gait belt    Activity Tolerance Patient tolerated treatment well;No increased pain    Behavior During Therapy [facility] for tasks assessed/performed          PT End of Session - 02/08/24 0934     Visit Number 25    Number of Visits 32    Date for PT Re-Evaluation 04/06/24    Progress Note Due on Visit 30    PT Start Time 0930    PT Stop Time 1010    PT Time Calculation (min) 40 min    Equipment Utilized During Treatment Gait belt    Activity Tolerance Patient tolerated treatment well;No increased pain    Behavior During Therapy [facility] for tasks assessed/performed            Past Medical History:  Diagnosis Date   Diabetes mellitus without complication [facility])    Past Surgical History:  Procedure Laterality Date   KNEE ARTHROSCOPY Right 08/27/2023   Procedure: ARTHROSCOPY KNEE;  Surgeon: Tobie Priest, MD;  Location: [facility] ORS;  Service: Orthopedics;  Laterality: Right;   Patient Active Problem List   Diagnosis Date Noted   Closed patellar dislocation, right, initial encounter 09/01/2023   Patellar dislocation 08/26/2023   Type 2 diabetes mellitus without complication, with long-term current use of insulin  (HCC) 08/26/2023   Hypoxia 08/26/2023   Morbid obesity (HCC) 10/09/2021   Diabetes mellitus with neuropathy (HCC) 10/09/2021   Essential hypertension 10/09/2021   Thrombocytopenia (HCC) 10/09/2021   Hypothyroidism 10/09/2021   Prader-Willi syndrome 10/09/2021   Cellulitis 10/08/2021    PCP: [facility] REFERRING PROVIDER:  Pegge Toribio PARAS, PA-C  REFERRING DIAG:  Diagnosis  S83.004A (ICD-10-CM) - Closed patellar dislocation, right, initial encounter   Rationale for Evaluation and Treatment: Rehabilitation THERAPY DIAG:  Muscle weakness (generalized)  Other lack of coordination  Difficulty in walking, not elsewhere classified  Unsteadiness on feet  Chronic pain of right knee  Closed patellar dislocation, right, initial encounter  ONSET DATE: 08/26/2023  SUBJECTIVE:  SUBJECTIVE STATEMENT Pt doing well. No significant updates, felt good after last session.    PERTINENT HISTORY:  28yoF referred to OPPT s/p Rt knee disclocation/fall 08/26/2023. Workup revealing of lateral femoral condyle impacted fx and medial patella impacted fracture. Pt underwent open Rt patellafemoral reduction and lateral release on 08/27/2023. Pt DC to CIR from 09/02/23 - 09/15/23, then DC to home with a hinged knee brace and WBAT. Upon return to home, pt AMB c RW limited household distances (limited mostly by Rt knee pain), WC for home entry/exit and for longer distances, inability to AMB stairs, difficulty with STS, particularly from her school's toilet. Upon return to home pt required assistance with dressing. Baseline functional status includes: independent with ADL, independent with community AMB. PMH: Prader-Willi syndrome, I/DD, DM II, diabetic neuropathy, HTN, thrombocytopenia, hypothyroidism.  Pt goal:  Pt wants to improve ability to ambulate for her school prom. She likes to watch movies.    PAIN:  Are you having pain? No  PRECAUTIONS:  Knee - WBAT  WEIGHT BEARING RESTRICTIONS:  WBAT, no brace needed   FALLS:  Has patient fallen in last 6 months? Yes (1x)   LIVING ENVIRONMENT: Lives with: lives with their family in one level  home Stairs: 2 steps, handrails bilat to enter home, but also has two ramps that she uses to enter home and is not currently using steps Has following equipment at home: RW  OCCUPATION:  Student   PLOF:  Independent  PATIENT GOALS:  Pt wants to walk to be able to go to school prom; she wants to be able to dance.  OBJECTIVE:  Note: Objective measures were completed at Evaluation unless otherwise noted.  DIAGNOSTIC FINDINGS:  Via chart, note Tobie Earnestine Clamp, MD R Knee radiographs:  10/18/2023: 2 views (wheelchair bound lateral and oblique radiographs with suboptimal positioning) were obtained. Appropriate images cannot be obtained due to patient's hesitation/unwillingness to transfer to x-ray table. Within these limitations, there does not appear to be a recurrent patellar dislocation. There is significant osteopenia. Impaction fracture about the lateral femoral condyle is also visualized. It does not appear to fully involve the articular weightbearing surface.     TREATMENT DATE  02/08/24 :  -AMB 2 laps over LL, no device, steady pacing, no pain, no fatigue: 940ft 43m 27sec  -STS from chair no hands x15 (2 sets with rest)  -3x up and down 4 stairs, cues for alternating (twice with breaks between)  -AMB 137ft c 2lb AW bilat, cues for fast walking 45m 22sec (0.73m/s) -AMB 1104ft c 2lb AW bilat, cues for fast walking 48m 00sec (0.5m/s)     PATIENT EDUCATION:  Education details: goal reassessment  Person educated: Patient and Parent Education method: Explanation, Demonstration, Tactile cues, and Verbal cues Education comprehension: verbalized understanding, returned demonstration, verbal cues required, tactile cues required, and needs further education  HOME EXERCISE PROGRAM: Access Code: [email or web address] URL: [email or web address] Date: 02/08/2024 Prepared by: Peggye Linear  Exercises - Sit to Stand with Counter Support  - 1 x daily - 3 x weekly - 3 sets - 15  reps - Standing March with Counter Support  - 1 x daily - 3 x weekly - 3 sets - 20 reps - Heel Toe Raises with Counter Support  - 1 x daily - 3 x weekly - 3 sets - 20 reps - Psychologist, counselling with Single Rail Using Step-To Pattern (One Step at a Time)  - 1 x daily - 3 x weekly - 2 sets  ASSESSMENT:  CLINICAL IMPRESSION: Continued to work on remaining deficits in AMB speed, tolerance, and confidence in balance. Pt denies any pain or fatigue in session despite continued antalgic gait unclear if similar pattern prior to injury. Updated HEP and provided extra education to mom in anticipation of today being the last visit approaved by insurance. Rest breaks provided as needed. Pt is able  Pt will continue to benefit from skilled therapy to address remaining deficits in order to improve overall QoL and return to PLOF.     OBJECTIVE IMPAIRMENTS: Abnormal gait, decreased activity tolerance, decreased balance, decreased coordination, decreased endurance, decreased mobility, difficulty walking, decreased ROM, decreased strength, increased edema, impaired flexibility, improper body mechanics, postural dysfunction, obesity, and pain.  ACTIVITY LIMITATIONS: carrying, lifting, bending, standing, squatting, stairs, transfers, toileting, dressing, and locomotion level PARTICIPATION LIMITATIONS: meal prep, cleaning, laundry, shopping, community activity, yard work, and school PERSONAL FACTORS: Fitness, Sex, and 3+ comorbidities: PMH significant for DM II with neuropathy, HTN, thrombocytopenia, hypothyroidism, Prader-Willi syndrome,  are also affecting patient's functional outcome.  REHAB POTENTIAL: Good CLINICAL DECISION MAKING: Evolving/moderate complexity EVALUATION COMPLEXITY: Moderate   GOALS: Goals reviewed with patient? Yes  SHORT TERM GOALS: Target date: 02/24/2024  Patient will be independent in home exercise program to improve strength/mobility for better functional independence with ADLs. Baseline:  initiated Goal status: INITIAL  LONG TERM GOALS: Target date: 04/06/2024  Patient will increase lower extremity functional scale to >60/80 to demonstrate improved functional mobility and increased tolerance with ADLs. .  Baseline: deferred; 7/10: 47 Goal status: IN PROGRESS  2.  Patient (> 60 years old) will complete five times sit to stand test in < 15 seconds indicating an increased LE strength and improved balance. Baseline: 42.5 sec, heavy use of BUE 5/21: 14.27 sec.  Heavy use of BUE on RW; 7/10: 14.5 sec light push off armrests, no RW  Goal status: IN PROGRESS  3.  Patient will increase Berg Balance score by > 6 points to demonstrate decreased fall risk during functional activities Baseline: deferred to future session, may need to complete different balance test if pt has difficulty with cuing for test activities  5/21: 33/56: 7/10: 46/56 Goal status: IN PROGRESS  4.  Patient will increase 10 meter walk test to >1.4m/s as to improve gait speed for better community ambulation and to reduce fall risk. Baseline: 0.16 with BRW 5/21: 0.71m/s with RW  12/02/2023= 0.31 m/s with RW 01/13/24: 0.67 m/s no AD Goal status: IN PROGRESS  5.  Patient will increase six minute walk test distance to >1000 for progression to community ambulator and improve gait ability. Baseline:  280 ft with RW 5/21: 377ft with RW;  7/10: 684 ft without an AD; 969ft AMB in 20m27sec;  Goal status: IN PROGRESS  PLAN:  PT FREQUENCY: 1-2x/week  PT DURATION: 12 weeks  PLANNED INTERVENTIONS: 97164- PT Re-evaluation, 97750- Physical Performance Testing, 97110-Therapeutic exercises, 97530- Therapeutic activity, [id number]- Neuromuscular re-education, 97535- Self Care, 02859- Manual therapy, [id number]- Gait training, [phone]- Orthotic Initial, [phone]- Orthotic/Prosthetic subsequent, [phone]- Canalith repositioning, [id number]- Splinting, H9716- Electrical stimulation (unattended), [phone]- Electrical stimulation (manual), [id number]- Ultrasound,  Patient/Family education, Balance training, Stair training, Taping, Joint mobilization, Joint manipulation, Spinal mobilization, Scar mobilization, Vestibular training, DME instructions, Wheelchair mobility training, Cryotherapy, and Moist heat.  PLAN FOR NEXT SESSION: -faster walking -longer distance walking tolernace -community navigation balance/stability multitasking   9:44 AM, 02/08/24 [name], PT, DPT Physical Therapist - [place] [facility]  Outpatient Physical Therapy- Main Campus [phone]

## 2024-02-09 NOTE — Therapy (Signed)
 Occupational Therapy Treatment note   Patient Name: Linda Mason MRN: [id number] DOB:10-15-1995, 28 y.o., female Today's Date: 02/09/2024  PCP: [facility] REFERRING PROVIDER: Toribio Mason   OT End of Session - 02/09/24 1053     Visit Number 27    Number of Visits 24    Date for OT Re-Evaluation 03/30/24    OT Start Time 1015    OT Stop Time 1100    OT Time Calculation (min) 45 min    Activity Tolerance Patient tolerated treatment well    Behavior During Therapy [facility] for tasks assessed/performed               Past Medical History:  Diagnosis Date   Diabetes mellitus without complication (HCC)    Past Surgical History:  Procedure Laterality Date   KNEE ARTHROSCOPY Right 08/27/2023   Procedure: ARTHROSCOPY KNEE;  Surgeon: Linda Priest, MD;  Location: [facility] ORS;  Service: Orthopedics;  Laterality: Right;   Patient Active Problem List   Diagnosis Date Noted   Closed patellar dislocation, right, initial encounter 09/01/2023   Patellar dislocation 08/26/2023   Type 2 diabetes mellitus without complication, with long-term current use of insulin  (HCC) 08/26/2023   Hypoxia 08/26/2023   Morbid obesity (HCC) 10/09/2021   Diabetes mellitus with neuropathy (HCC) 10/09/2021   Essential hypertension 10/09/2021   Thrombocytopenia (HCC) 10/09/2021   Hypothyroidism 10/09/2021   Prader-Willi syndrome 10/09/2021   Cellulitis 10/08/2021   ONSET DATE: 08/26/2023  REFERRING DIAG: Right knee patella fracture  THERAPY DIAG:  Muscle weakness (generalized)  Other lack of coordination  Rationale for Evaluation and Treatment: Rehabilitation  SUBJECTIVE:   SUBJECTIVE STATEMENT:  Pt. Reports  that she is traveling in a few week up [place].  Pt accompanied by: Interpreter:  Father,  Spanish speaking interpreter  PERTINENT HISTORY: Linda Mason is a 28 year old right-handed morbidly obese female and BMI 44.88 as well as diabetes mellitus,  Prader-Willi syndrome, intellectual delay. Presented to [facility] ED on 08/26/2023 after mechanical fall getting out of her bed. X-rays and imaging revealed right knee irreducible patellar dislocation right knee lateral femoral condyle impacting fracture as well as right knee medial patella impaction fracture. Pt underwent right knee arthroscopic assisted patellar reduction with lateral release on 08/27/2023 per Dr. Priest Linda.  Weightbearing as tolerated right lower extremity knee immobilizer when out of bed or when walking. Pt admitted to CIR from 09/02/23 - 09/15/23  PRECAUTIONS: Knee , Knee Immobilizer    WEIGHT BEARING RESTRICTIONS: Yes WBAT  PAIN:  Are you having pain? No pain today.  FALLS: Has patient fallen in last 6 months? No  LIVING ENVIRONMENT: Lives with: lives with their family Lives in: House/apartment Stairs: No ramp Has following equipment at home: Wheelchair (manual)  PLOF: Independent with basic ADLs  PATIENT GOALS: to return to school and go to [employer] with no fear of falling   NEXT MD VISIT: none  OBJECTIVE:  Note: Objective measures were completed at Evaluation unless otherwise noted.  HAND DOMINANCE: Left  ADLs: Transfers/ambulation related to ADLs: SUPERVISION short distances using RW, MOD I using w/c Eating: IND Grooming: IND Upper body dressing: SETUP assist Lower body dressing: MAX A Toileting: SUPERVISION Bathing: MOD A bathing   Tub shower transfers: SUPERVISION Equipment: Shower seat with back, Walk in shower, and bed side commode  UPPER EXTREMITY ROM:     Active ROM Right  [facility] Right 01/06/2024 Left  [facility] Left 01/06/2024  Shoulder Flexion  100 (135)  115(136)  Shoulder Abduction  90(130)  [id number])  Elbow Flexion  115(136)  [id number])  Elbow Extension  [facility]  WFL  Wrist Flexion  32(70)  25(38)-pain   Wrist Extension  36(60)  WFL    UPPER EXTREMITY MMT:   Question accuracy given difficulty following commands   MMT Right eval Right 01/06/2024  Left eval Left 01/06/2024  Shoulder flexion 3+/5 3-/5 3+/5 3-/5  Shoulder abduction  3-/5  3-/5  Elbow flexion 3+/5 3-/5 3+/5 3-/5  Elbow extension 3+/5 3+/5 3+/5 3+/5  Wrist flexion  3-/5  3-/5  Wrist extension  3+/5  3+/5  (Blank rows = not tested)  HAND FUNCTION: Grip strength: Right: 10 lbs; Left: 15 lbs, Lateral pinch: Right: 4 lbs, Left: 5 lbs, and 3 point pinch: Right: 1 lbs, Left: 1 lbs  01/06/2024 Grip Strength: Right: 13#, Left: 26#, Lateral pinch: Right: 6#, Left: 5#, 3 point pinch: Right: 5#, Left: 4#  COORDINATION: 9 Hole Peg test: Right: 40 sec; Left: 30 sec  01/06/2024 Right: 31 secs, Left: 27 secs  SENSATION: Light touch: Impaired  tingling  EDEMA: none  COGNITION: Overall cognitive status: History of cognitive impairments - at baseline Areas of impairment: Areas of impairment: Problem solving  Commands: Follows one step commands with increased time Behavior: Perseveration and anxious  OBSERVATIONS: Pt appears anxious and looking toward her mother for answers to questions.    TREATMENT DATE: 02/08/2024  Therapeutic Ex.:   -Performed BUE strengthening using a 3# dowel ex. 2/2 to weakness. Bilateral shoulder flexion, chest press, circular patterns, and elbow flexion/extension for 1 set 15 reps each to improve strength for ADL/IADL tasks for 2 sets 10 reps each with rest breaks  Therapeutic Activities:   -facilitated bilateral UE functional reaching for cones positioned at multiple planes using a 2# wrist cuff weight. -Facilitated 2pt. and 3pt. grasp patterns removing 1 resistive cubes, and replacing them using isolated 2nd digit extension to press them into place.  -The task was combined with the board was placed vertically throughout multiple elevated planes during [facility] tasks.  Neuromuscular re-education:   -facilitated bilateral [facility] skills grasping 1/2 circular tipped pegs and placing them into a pegboard positioned vertically to encourage wrist  extension.   PATIENT EDUCATION: Education details: BUE strengthening, bilateral functional reaching,FMC Person educated: Parent, pt Education method: Medical illustrator Education comprehension: verbalized understanding, mother receptive  HOME EXERCISE PROGRAM: Pt. To continue practicing A/E use at home with reacher provided during previous treatment session.   GOALS: Goals reviewed with patient? Yes    SHORT TERM GOALS: Target date: 02/17/2024    Pt will INDEPENDENTLY complete HEP  Baseline: 11/15/2023: Supervision, cues 10/14/23: no HEP issued Goal status: INITIAL  LONG TERM GOALS: Target date: 03/30/2024    Pt will increase B strength by 15# to assist with gripping grab bar for safe transfers   Baseline: 01/06/2024: Grip Strength: R: 13#, L:26#. Pt. Has improved with grasping the grab bars. 11/15/23: Pt. Is able to grip  the grab bar. 10/14/23: L 15#, R 10#  Goal status:  progressing, ongoing  2.  Pt will INDEPENDENTLY transfer to regular height toilet  Baseline: 01/05/3034: MOD I toileting transfers using RW 11/15/23: Modified Independent toileting at home, Supervision using the hospital clinic bathroom Eval: SUPERVISION + RW for elevated toilet, MIN A + RW for regular toilet Goal status: Achieved.   3.  Pt will improve B FMC by decreasing 9 hole PEG test score by 10 seconds for participation in leisure activities (coloring).  Baseline: 01/06/2024: 9 hole peg test: R: 31 sec, L: 27 sec. 11/15/23:Continue Eval: L 30, R 40 Goal status: progressing, ongoing  4.  Pt will verbalize x3 strategies to manage anxiousness and fear of falling to reduce fall risk. Baseline: 01/06/2024: Pt. Is now using a walker to complete toileting tasks consistently without fear of falling. 11/15/23: Consistent cues required Eval: 0/3 strategies.  Goal status: Goal achieved.   5.  Pt will increase B lateral pinch strength by 5# to manage ADL items.  Baseline: 01/06/2024: Lateral: R: 6#, L: 5#. Eval:  Continue R 4#, L 5# Goal status: Progressing, Ongoing  6. Pt. Will perform LE dressing with Supervision with A/E use. Baseline: 01/06/2024: A/E reviewed, Pts continues to require assist from her mother to donn shoes and socks. Eval: MinA donning/doffing socks, ModA shoes Goal Status: Ongoing   7. Pt. Will be able to tolerate performing a 15 min. Standing ADL/IADL tasks using a RW without requiring rest breaks    Baseline: 01/06/24: Pt. Requires multiple rest breaks for minimal standing reaching activity  Goal status: NEW   ASSESSMENT:  CLINICAL IMPRESSION:  Pt. Tolerated the UE strengthening well with the 3# dowel, however required cues for pace, and form. Pt. requires assist and cues for trunk alignment at times 2/2 leaning when reaching for cones through multiple planes. Pt. was able to manipulate the small objects within her right hand, however has difficulty attempting translatory movements. Pt. continues to benefit from OT services to improve BUE strength/dexterity and to provide ADL training in order to maximize indep with daily tasks.   PERFORMANCE DEFICITS: in functional skills including ADLs, coordination, strength, pain, and balance, cognitive skills including attention and problem solving, and psychosocial skills including coping strategies and environmental adaptation.   IMPAIRMENTS: are limiting patient from ADLs and leisure.   COMORBIDITIES: has co-morbidities such as Prader-Willi syndrome, intellectual delay that affect occupational performance. Patient will benefit from skilled OT to address above impairments and improve overall function.  MODIFICATION OR ASSISTANCE TO COMPLETE EVALUATION: Min-Moderate modification of tasks or assist with assess necessary to complete an evaluation.  OT OCCUPATIONAL PROFILE AND HISTORY: Detailed assessment: Review of records and additional review of physical, cognitive, psychosocial history related to current functional performance.  CLINICAL  DECISION MAKING: Moderate - several treatment options, min-mod task modification necessary  REHAB POTENTIAL: Good  EVALUATION COMPLEXITY: Moderate      PLAN:  OT FREQUENCY: 1-2x/week  OT DURATION: 12 weeks  PLANNED INTERVENTIONS: 97535 self care/ADL training, 02889 therapeutic exercise, 97530 therapeutic activity, patient/family education, and DME and/or AE instructions  RECOMMENDED OTHER SERVICES: PT  CONSULTED AND AGREED WITH PLAN OF CARE: Patient and family member/caregiver  PLAN FOR NEXT SESSION: ADLs  Richardson Otter, MS, OTR/L    02/09/2024, 10:54 AM

## 2024-02-10 ENCOUNTER — Ambulatory Visit: Payer: MEDICAID

## 2024-02-10 ENCOUNTER — Ambulatory Visit: Payer: MEDICAID | Admitting: Occupational Therapy

## 2024-02-14 NOTE — Therapy (Incomplete)
 Occupational Therapy Treatment note   Patient Name: Linda Mason MRN: [id number] DOB:12/31/1995, 28 y.o., female Today's Date: 02/14/2024  PCP: [facility] REFERRING PROVIDER: Toribio Pitch          Past Medical History:  Diagnosis Date   Diabetes mellitus without complication [facility])    Past Surgical History:  Procedure Laterality Date   KNEE ARTHROSCOPY Right 08/27/2023   Procedure: ARTHROSCOPY KNEE;  Surgeon: Tobie Priest, MD;  Location: [facility] ORS;  Service: Orthopedics;  Laterality: Right;   Patient Active Problem List   Diagnosis Date Noted   Closed patellar dislocation, right, initial encounter 09/01/2023   Patellar dislocation 08/26/2023   Type 2 diabetes mellitus without complication, with long-term current use of insulin  (HCC) 08/26/2023   Hypoxia 08/26/2023   Morbid obesity (HCC) 10/09/2021   Diabetes mellitus with neuropathy (HCC) 10/09/2021   Essential hypertension 10/09/2021   Thrombocytopenia (HCC) 10/09/2021   Hypothyroidism 10/09/2021   Prader-Willi syndrome 10/09/2021   Cellulitis 10/08/2021   ONSET DATE: 08/26/2023  REFERRING DIAG: Right knee patella fracture  THERAPY DIAG:  No diagnosis found.  Rationale for Evaluation and Treatment: Rehabilitation  SUBJECTIVE:   SUBJECTIVE STATEMENT:  Pt. Reports  that she is traveling in a few week up [place].  Pt accompanied by: Interpreter:  Father,  Spanish speaking interpreter  PERTINENT HISTORY: Linda Mason is a 28 year old right-handed morbidly obese female and BMI 44.88 as well as diabetes mellitus, Prader-Willi syndrome, intellectual delay. Presented to [facility] ED on 08/26/2023 after mechanical fall getting out of her bed. X-rays and imaging revealed right knee irreducible patellar dislocation right knee lateral femoral condyle impacting fracture as well as right knee medial patella impaction fracture. Pt underwent right knee arthroscopic assisted patellar reduction  with lateral release on 08/27/2023 per Dr. Priest Tobie.  Weightbearing as tolerated right lower extremity knee immobilizer when out of bed or when walking. Pt admitted to CIR from 09/02/23 - 09/15/23  PRECAUTIONS: Knee , Knee Immobilizer    WEIGHT BEARING RESTRICTIONS: Yes WBAT  PAIN:  Are you having pain? No pain today.  FALLS: Has patient fallen in last 6 months? No  LIVING ENVIRONMENT: Lives with: lives with their family Lives in: House/apartment Stairs: No ramp Has following equipment at home: Wheelchair (manual)  PLOF: Independent with basic ADLs  PATIENT GOALS: to return to school and go to [employer] with no fear of falling   NEXT MD VISIT: none  OBJECTIVE:  Note: Objective measures were completed at Evaluation unless otherwise noted.  HAND DOMINANCE: Left  ADLs: Transfers/ambulation related to ADLs: SUPERVISION short distances using RW, MOD I using w/c Eating: IND Grooming: IND Upper body dressing: SETUP assist Lower body dressing: MAX A Toileting: SUPERVISION Bathing: MOD A bathing   Tub shower transfers: SUPERVISION Equipment: Shower seat with back, Walk in shower, and bed side commode  UPPER EXTREMITY ROM:     Active ROM Right  [facility] Right 01/06/2024 Left  [facility] Left 01/06/2024  Shoulder Flexion  100 (135)  115(136)  Shoulder Abduction  90(130)  126(135)  Elbow Flexion  115(136)  118(134)  Elbow Extension  [facility]  WFL  Wrist Flexion  32(70)  25(38)-pain   Wrist Extension  36(60)  WFL    UPPER EXTREMITY MMT:   Question accuracy given difficulty following commands   MMT Right eval Right 01/06/2024 Left eval Left 01/06/2024  Shoulder flexion 3+/5 3-/5 3+/5 3-/5  Shoulder abduction  3-/5  3-/5  Elbow flexion 3+/5 3-/5 3+/5 3-/5  Elbow extension 3+/5 3+/5 3+/5 3+/5  Wrist flexion  3-/5  3-/5  Wrist extension  3+/5  3+/5  (Blank rows = not tested)  HAND FUNCTION: Grip strength: Right: 10 lbs; Left: 15 lbs, Lateral pinch: Right: 4 lbs, Left: 5 lbs, and 3 point  pinch: Right: 1 lbs, Left: 1 lbs  01/06/2024 Grip Strength: Right: 13#, Left: 26#, Lateral pinch: Right: 6#, Left: 5#, 3 point pinch: Right: 5#, Left: 4#  COORDINATION: 9 Hole Peg test: Right: 40 sec; Left: 30 sec  01/06/2024 Right: 31 secs, Left: 27 secs  SENSATION: Light touch: Impaired  tingling  EDEMA: none  COGNITION: Overall cognitive status: History of cognitive impairments - at baseline Areas of impairment: Areas of impairment: Problem solving  Commands: Follows one step commands with increased time Behavior: Perseveration and anxious  OBSERVATIONS: Pt appears anxious and looking toward her mother for answers to questions.    TREATMENT DATE: 02/15/2024  Therapeutic Ex.:   -Performed BUE strengthening using a 3# dowel ex. 2/2 to weakness. Bilateral shoulder flexion, chest press, circular patterns, and elbow flexion/extension for 1 set 15 reps each to improve strength for ADL/IADL tasks for 2 sets 10 reps each with rest breaks  Therapeutic Activities:   -facilitated bilateral UE functional reaching for cones positioned at multiple planes using a 2# wrist cuff weight. -Facilitated 2pt. and 3pt. grasp patterns removing 1 resistive cubes, and replacing them using isolated 2nd digit extension to press them into place.  -The task was combined with the board was placed vertically throughout multiple elevated planes during [facility] tasks.  Neuromuscular re-education:   -facilitated bilateral [facility] skills grasping 1/2 circular tipped pegs and placing them into a pegboard positioned vertically to encourage wrist extension.   PATIENT EDUCATION: Education details: BUE strengthening, bilateral functional reaching,FMC Person educated: Parent, pt Education method: Medical illustrator Education comprehension: verbalized understanding, mother receptive  HOME EXERCISE PROGRAM: Pt. To continue practicing A/E use at home with reacher provided during previous treatment session.    GOALS: Goals reviewed with patient? Yes    SHORT TERM GOALS: Target date: 02/17/2024    Pt will INDEPENDENTLY complete HEP  Baseline: 11/15/2023: Supervision, cues 10/14/23: no HEP issued Goal status: INITIAL  LONG TERM GOALS: Target date: 03/30/2024    Pt will increase B strength by 15# to assist with gripping grab bar for safe transfers   Baseline: 01/06/2024: Grip Strength: R: 13#, L:26#. Pt. Has improved with grasping the grab bars. 11/15/23: Pt. Is able to grip  the grab bar. 10/14/23: L 15#, R 10#  Goal status:  progressing, ongoing  2.  Pt will INDEPENDENTLY transfer to regular height toilet  Baseline: 01/05/3034: MOD I toileting transfers using RW 11/15/23: Modified Independent toileting at home, Supervision using the hospital clinic bathroom Eval: SUPERVISION + RW for elevated toilet, MIN A + RW for regular toilet Goal status: Achieved.   3.  Pt will improve B FMC by decreasing 9 hole PEG test score by 10 seconds for participation in leisure activities (coloring).  Baseline: 01/06/2024: 9 hole peg test: R: 31 sec, L: 27 sec. 11/15/23:Continue Eval: L 30, R 40 Goal status: progressing, ongoing  4.  Pt will verbalize x3 strategies to manage anxiousness and fear of falling to reduce fall risk. Baseline: 01/06/2024: Pt. Is now using a walker to complete toileting tasks consistently without fear of falling. 11/15/23: Consistent cues required Eval: 0/3 strategies.  Goal status: Goal achieved.   5.  Pt will increase B lateral pinch strength by  5# to manage ADL items.  Baseline: 01/06/2024: Lateral: R: 6#, L: 5#. Eval: Continue R 4#, L 5# Goal status: Progressing, Ongoing  6. Pt. Will perform LE dressing with Supervision with A/E use. Baseline: 01/06/2024: A/E reviewed, Pts continues to require assist from her mother to donn shoes and socks. Eval: MinA donning/doffing socks, ModA shoes Goal Status: Ongoing   7. Pt. Will be able to tolerate performing a 15 min. Standing ADL/IADL tasks using a  RW without requiring rest breaks    Baseline: 01/06/24: Pt. Requires multiple rest breaks for minimal standing reaching activity  Goal status: NEW   ASSESSMENT:  CLINICAL IMPRESSION:  ***Pt. Tolerated the UE strengthening well with the 3# dowel, however required cues for pace, and form. Pt. requires assist and cues for trunk alignment at times 2/2 leaning when reaching for cones through multiple planes. Pt. was able to manipulate the small objects within her right hand, however has difficulty attempting translatory movements. Pt. continues to benefit from OT services to improve BUE strength/dexterity and to provide ADL training in order to maximize indep with daily tasks.   PERFORMANCE DEFICITS: in functional skills including ADLs, coordination, strength, pain, and balance, cognitive skills including attention and problem solving, and psychosocial skills including coping strategies and environmental adaptation.   IMPAIRMENTS: are limiting patient from ADLs and leisure.   COMORBIDITIES: has co-morbidities such as Prader-Willi syndrome, intellectual delay that affect occupational performance. Patient will benefit from skilled OT to address above impairments and improve overall function.  MODIFICATION OR ASSISTANCE TO COMPLETE EVALUATION: Min-Moderate modification of tasks or assist with assess necessary to complete an evaluation.  OT OCCUPATIONAL PROFILE AND HISTORY: Detailed assessment: Review of records and additional review of physical, cognitive, psychosocial history related to current functional performance.  CLINICAL DECISION MAKING: Moderate - several treatment options, min-mod task modification necessary  REHAB POTENTIAL: Good  EVALUATION COMPLEXITY: Moderate      PLAN:  OT FREQUENCY: 1-2x/week  OT DURATION: 12 weeks  PLANNED INTERVENTIONS: 97535 self care/ADL training, 02889 therapeutic exercise, 97530 therapeutic activity, patient/family education, and DME and/or AE  instructions  RECOMMENDED OTHER SERVICES: PT  CONSULTED AND AGREED WITH PLAN OF CARE: Patient and family member/caregiver  PLAN FOR NEXT SESSION: ADLs  Elston Slot, M.S. OTR/L  02/14/24, 10:07 PM  ascom [phone]    02/14/2024, 10:06 PM

## 2024-02-15 ENCOUNTER — Ambulatory Visit: Payer: MEDICAID | Admitting: Occupational Therapy

## 2024-02-15 ENCOUNTER — Ambulatory Visit: Payer: MEDICAID

## 2024-02-17 ENCOUNTER — Ambulatory Visit: Payer: MEDICAID

## 2024-02-17 ENCOUNTER — Ambulatory Visit: Payer: MEDICAID | Admitting: Occupational Therapy

## 2024-02-21 NOTE — Therapy (Incomplete)
 OUTPATIENT PHYSICAL THERAPY TREATMENT  Patient Name: Linda Mason MRN: [id number] DOB:[birth year], 28 y.o., female Today's Date: 02/21/2024  END OF SESSION:        Past Medical History:  Diagnosis Date   Diabetes mellitus without complication [facility])    Past Surgical History:  Procedure Laterality Date   KNEE ARTHROSCOPY Right 08/27/2023   Procedure: ARTHROSCOPY KNEE;  Surgeon: Tobie Priest, MD;  Location: [facility] ORS;  Service: Orthopedics;  Laterality: Right;   Patient Active Problem List   Diagnosis Date Noted   Closed patellar dislocation, right, initial encounter 09/01/2023   Patellar dislocation 08/26/2023   Type 2 diabetes mellitus without complication, with long-term current use of insulin  (HCC) 08/26/2023   Hypoxia 08/26/2023   Morbid obesity (HCC) 10/09/2021   Diabetes mellitus with neuropathy (HCC) 10/09/2021   Essential hypertension 10/09/2021   Thrombocytopenia (HCC) 10/09/2021   Hypothyroidism 10/09/2021   Prader-Willi syndrome 10/09/2021   Cellulitis 10/08/2021    PCP: [facility] REFERRING PROVIDER: Pegge Toribio PARAS, PA-C  REFERRING DIAG:  Diagnosis  S83.004A (ICD-10-CM) - Closed patellar dislocation, right, initial encounter   Rationale for Evaluation and Treatment: Rehabilitation THERAPY DIAG:  Muscle weakness (generalized)  Unsteadiness on feet  Other lack of coordination  Difficulty in walking, not elsewhere classified  Closed patellar dislocation, right, initial encounter  Chronic pain of right knee  ONSET DATE: 08/26/2023  SUBJECTIVE:                                                                                                                                                                                          SUBJECTIVE STATEMENT ***  Pt doing well. No significant updates, felt good after last session.    PERTINENT HISTORY:  28yoF referred to OPPT s/p Rt knee disclocation/fall 08/26/2023. Workup  revealing of lateral femoral condyle impacted fx and medial patella impacted fracture. Pt underwent open Rt patellafemoral reduction and lateral release on 08/27/2023. Pt DC to CIR from 09/02/23 - 09/15/23, then DC to home with a hinged knee brace and WBAT. Upon return to home, pt AMB c RW limited household distances (limited mostly by Rt knee pain), WC for home entry/exit and for longer distances, inability to AMB stairs, difficulty with STS, particularly from her school's toilet. Upon return to home pt required assistance with dressing. Baseline functional status includes: independent with ADL, independent with community AMB. PMH: Prader-Willi syndrome, I/DD, DM II, diabetic neuropathy, HTN, thrombocytopenia, hypothyroidism.  Pt goal:  Pt wants to improve ability to ambulate for her school prom. She likes to watch movies.    PAIN:  Are you having pain? No  PRECAUTIONS:  Knee -  WBAT  WEIGHT BEARING RESTRICTIONS:  WBAT, no brace needed   FALLS:  Has patient fallen in last 6 months? Yes (1x)   LIVING ENVIRONMENT: Lives with: lives with their family in one level home Stairs: 2 steps, handrails bilat to enter home, but also has two ramps that she uses to enter home and is not currently using steps Has following equipment at home: RW  OCCUPATION:  Student   PLOF:  Independent  PATIENT GOALS:  Pt wants to walk to be able to go to school prom; she wants to be able to dance.  OBJECTIVE:  Note: Objective measures were completed at Evaluation unless otherwise noted.  DIAGNOSTIC FINDINGS:  Via chart, note Tobie Earnestine Clamp, MD R Knee radiographs:  10/18/2023: 2 views (wheelchair bound lateral and oblique radiographs with suboptimal positioning) were obtained. Appropriate images cannot be obtained due to patient's hesitation/unwillingness to transfer to x-ray table. Within these limitations, there does not appear to be a recurrent patellar dislocation. There is significant osteopenia.  Impaction fracture about the lateral femoral condyle is also visualized. It does not appear to fully involve the articular weightbearing surface.     TREATMENT DATE  02/21/24 : ***  -AMB 2 laps over LL, no device, steady pacing, no pain, no fatigue: 919ft 21m 27sec  -STS from chair no hands x15 (2 sets with rest)  -3x up and down 4 stairs, cues for alternating (twice with breaks between)  -AMB 12ft c 2lb AW bilat, cues for fast walking 92m 22sec (0.35m/s) -AMB 138ft c 2lb AW bilat, cues for fast walking 82m 00sec (0.55m/s)     PATIENT EDUCATION:  Education details: goal reassessment  Person educated: Patient and Parent Education method: Explanation, Demonstration, Tactile cues, and Verbal cues Education comprehension: verbalized understanding, returned demonstration, verbal cues required, tactile cues required, and needs further education  HOME EXERCISE PROGRAM: Access Code: [email or web address] URL: [email or web address] Date: 02/08/2024 Prepared by: Peggye Linear  Exercises - Sit to Stand with Counter Support  - 1 x daily - 3 x weekly - 3 sets - 15 reps - Standing March with Counter Support  - 1 x daily - 3 x weekly - 3 sets - 20 reps - Heel Toe Raises with Counter Support  - 1 x daily - 3 x weekly - 3 sets - 20 reps - Psychologist, counselling with Single Rail Using Step-To Pattern (One Step at a Time)  - 1 x daily - 3 x weekly - 2 sets  ASSESSMENT:  CLINICAL IMPRESSION: ***  Continued to work on remaining deficits in AMB speed, tolerance, and confidence in balance. Pt denies any pain or fatigue in session despite continued antalgic gait unclear if similar pattern prior to injury. Updated HEP and provided extra education to mom in anticipation of today being the last visit approaved by insurance. Rest breaks provided as needed. Pt is able  Pt will continue to benefit from skilled therapy to address remaining deficits in order to improve overall QoL and return to PLOF.      OBJECTIVE IMPAIRMENTS: Abnormal gait, decreased activity tolerance, decreased balance, decreased coordination, decreased endurance, decreased mobility, difficulty walking, decreased ROM, decreased strength, increased edema, impaired flexibility, improper body mechanics, postural dysfunction, obesity, and pain.  ACTIVITY LIMITATIONS: carrying, lifting, bending, standing, squatting, stairs, transfers, toileting, dressing, and locomotion level PARTICIPATION LIMITATIONS: meal prep, cleaning, laundry, shopping, community activity, yard work, and school PERSONAL FACTORS: Fitness, Sex, and 3+ comorbidities: PMH significant for DM II with neuropathy, HTN, thrombocytopenia, hypothyroidism, Prader-Willi syndrome,  are also affecting patient's functional outcome.  REHAB POTENTIAL: Good CLINICAL DECISION MAKING: Evolving/moderate complexity EVALUATION COMPLEXITY: Moderate   GOALS: Goals reviewed with patient? Yes  SHORT TERM GOALS: Target date: 02/24/2024  Patient will be independent in home exercise program to improve strength/mobility for better functional independence with ADLs. Baseline: initiated Goal status: INITIAL  LONG TERM GOALS: Target date: 04/06/2024  Patient will increase lower extremity functional scale to >60/80 to demonstrate improved functional mobility and increased tolerance with ADLs. .  Baseline: deferred; 7/10: 47 Goal status: IN PROGRESS  2.  Patient (> 28 years old) will complete five times sit to stand test in < 15 seconds indicating an increased LE strength and improved balance. Baseline: 42.5 sec, heavy use of BUE 5/21: 14.27 sec.  Heavy use of BUE on RW; 7/10: 14.5 sec light push off armrests, no RW  Goal status: IN PROGRESS  3.  Patient will increase Berg Balance score by > 6 points to demonstrate decreased fall risk during functional activities Baseline: deferred to future session, may need to complete different balance test if pt has difficulty with cuing for  test activities  5/21: 33/56: 7/10: 46/56 Goal status: IN PROGRESS  4.  Patient will increase 10 meter walk test to >1.74m/s as to improve gait speed for better community ambulation and to reduce fall risk. Baseline: 0.16 with BRW 5/21: 0.61m/s with RW  12/02/2023= 0.31 m/s with RW 01/13/24: 0.67 m/s no AD Goal status: IN PROGRESS  5.  Patient will increase six minute walk test distance to >1000 for progression to community ambulator and improve gait ability. Baseline:  280 ft with RW 5/21: 368ft with RW;  7/10: 684 ft without an AD; 988ft AMB in 34m27sec;  Goal status: IN PROGRESS  PLAN:  PT FREQUENCY: 1-2x/week  PT DURATION: 12 weeks  PLANNED INTERVENTIONS: [id number]- PT Re-evaluation, 97750- Physical Performance Testing, 97110-Therapeutic exercises, 97530- Therapeutic activity, [id number]- Neuromuscular re-education, 97535- Self Care, 02859- Manual therapy, [id number]- Gait training, [phone]- Orthotic Initial, [phone]- Orthotic/Prosthetic subsequent, [phone]- Canalith repositioning, [id number]- Splinting, H9716- Electrical stimulation (unattended), [phone]- Electrical stimulation (manual), [id number]- Ultrasound, Patient/Family education, Balance training, Stair training, Taping, Joint mobilization, Joint manipulation, Spinal mobilization, Scar mobilization, Vestibular training, DME instructions, Wheelchair mobility training, Cryotherapy, and Moist heat.  PLAN FOR NEXT SESSION: -faster walking -longer distance walking tolernace -community navigation balance/stability multitasking   10:40 AM, 02/21/24 Maryanne Finder, PT, DPT Physical Therapist - [place]  [facility]

## 2024-02-22 ENCOUNTER — Ambulatory Visit: Payer: MEDICAID

## 2024-02-22 ENCOUNTER — Ambulatory Visit: Payer: MEDICAID | Admitting: Occupational Therapy

## 2024-02-24 ENCOUNTER — Telehealth: Payer: Self-pay | Admitting: Occupational Therapy

## 2024-02-24 ENCOUNTER — Ambulatory Visit: Payer: MEDICAID

## 2024-02-24 ENCOUNTER — Ambulatory Visit: Payer: MEDICAID | Admitting: Occupational Therapy

## 2024-02-29 ENCOUNTER — Ambulatory Visit: Payer: MEDICAID

## 2024-02-29 ENCOUNTER — Ambulatory Visit: Payer: MEDICAID | Admitting: Occupational Therapy

## 2024-03-02 ENCOUNTER — Ambulatory Visit: Payer: MEDICAID

## 2024-03-02 ENCOUNTER — Ambulatory Visit: Payer: MEDICAID | Admitting: Occupational Therapy

## 2024-03-07 ENCOUNTER — Ambulatory Visit: Payer: MEDICAID

## 2024-03-07 ENCOUNTER — Ambulatory Visit: Payer: MEDICAID | Admitting: Occupational Therapy

## 2024-03-09 ENCOUNTER — Ambulatory Visit: Payer: MEDICAID

## 2024-03-09 ENCOUNTER — Ambulatory Visit: Payer: MEDICAID | Admitting: Occupational Therapy

## 2024-03-14 ENCOUNTER — Ambulatory Visit: Payer: MEDICAID

## 2024-03-14 ENCOUNTER — Ambulatory Visit: Payer: MEDICAID | Admitting: Occupational Therapy

## 2024-03-16 ENCOUNTER — Ambulatory Visit: Payer: MEDICAID

## 2024-03-16 ENCOUNTER — Ambulatory Visit: Payer: MEDICAID | Admitting: Occupational Therapy

## 2024-03-21 ENCOUNTER — Ambulatory Visit: Payer: MEDICAID | Admitting: Occupational Therapy

## 2024-03-21 ENCOUNTER — Ambulatory Visit: Payer: MEDICAID

## 2024-03-23 ENCOUNTER — Ambulatory Visit: Payer: MEDICAID

## 2024-03-23 ENCOUNTER — Ambulatory Visit: Payer: MEDICAID | Admitting: Occupational Therapy

## 2024-03-28 ENCOUNTER — Ambulatory Visit: Payer: MEDICAID

## 2024-03-28 ENCOUNTER — Ambulatory Visit: Payer: MEDICAID | Admitting: Occupational Therapy

## 2024-03-30 ENCOUNTER — Ambulatory Visit: Payer: MEDICAID | Admitting: Occupational Therapy

## 2024-03-30 ENCOUNTER — Ambulatory Visit: Payer: MEDICAID

## 2024-04-04 ENCOUNTER — Ambulatory Visit: Payer: MEDICAID | Admitting: Occupational Therapy

## 2024-04-04 ENCOUNTER — Ambulatory Visit: Payer: MEDICAID

## 2024-04-06 ENCOUNTER — Ambulatory Visit: Payer: MEDICAID | Admitting: Occupational Therapy

## 2024-04-06 ENCOUNTER — Ambulatory Visit: Payer: MEDICAID

## 2024-04-11 ENCOUNTER — Ambulatory Visit: Payer: MEDICAID

## 2024-04-11 ENCOUNTER — Ambulatory Visit: Payer: MEDICAID | Admitting: Occupational Therapy

## 2024-04-13 ENCOUNTER — Ambulatory Visit: Payer: MEDICAID | Admitting: Occupational Therapy

## 2024-04-13 ENCOUNTER — Ambulatory Visit: Payer: MEDICAID

## 2024-04-18 ENCOUNTER — Ambulatory Visit: Payer: MEDICAID

## 2024-04-18 ENCOUNTER — Ambulatory Visit: Payer: MEDICAID | Admitting: Occupational Therapy

## 2024-04-20 ENCOUNTER — Ambulatory Visit: Payer: MEDICAID

## 2024-04-20 ENCOUNTER — Ambulatory Visit: Payer: MEDICAID | Admitting: Occupational Therapy

## 2024-04-25 ENCOUNTER — Ambulatory Visit: Payer: MEDICAID

## 2024-04-25 ENCOUNTER — Ambulatory Visit: Payer: MEDICAID | Admitting: Occupational Therapy

## 2024-04-27 ENCOUNTER — Ambulatory Visit: Payer: MEDICAID | Admitting: Occupational Therapy

## 2024-04-27 ENCOUNTER — Ambulatory Visit: Payer: MEDICAID
